# Patient Record
Sex: Male | Born: 1960 | Race: White | Hispanic: No | Smoking: Former smoker
Health system: Southern US, Community
[De-identification: ages and names within clinical notes are randomized; demographics above are authoritative.]

## PROBLEM LIST (undated history)

## (undated) DIAGNOSIS — K90829 Short bowel syndrome, unspecified: Secondary | ICD-10-CM

## (undated) DIAGNOSIS — I339 Acute and subacute endocarditis, unspecified: Secondary | ICD-10-CM

## (undated) DIAGNOSIS — F32A Depression, unspecified: Secondary | ICD-10-CM

## (undated) DIAGNOSIS — F509 Eating disorder, unspecified: Secondary | ICD-10-CM

## (undated) DIAGNOSIS — F329 Major depressive disorder, single episode, unspecified: Secondary | ICD-10-CM

## (undated) DIAGNOSIS — N200 Calculus of kidney: Secondary | ICD-10-CM

## (undated) DIAGNOSIS — Z5189 Encounter for other specified aftercare: Secondary | ICD-10-CM

## (undated) DIAGNOSIS — K5090 Crohn's disease, unspecified, without complications: Secondary | ICD-10-CM

## (undated) DIAGNOSIS — K219 Gastro-esophageal reflux disease without esophagitis: Secondary | ICD-10-CM

## (undated) DIAGNOSIS — K912 Postsurgical malabsorption, not elsewhere classified: Secondary | ICD-10-CM

## (undated) DIAGNOSIS — M810 Age-related osteoporosis without current pathological fracture: Secondary | ICD-10-CM

## (undated) HISTORY — PX: ILEOSTOMY: SHX1783

## (undated) HISTORY — DX: Acute and subacute endocarditis, unspecified: I33.9

## (undated) HISTORY — DX: Depression, unspecified: F32.A

## (undated) HISTORY — DX: Age-related osteoporosis without current pathological fracture: M81.0

## (undated) HISTORY — DX: Short bowel syndrome, unspecified: K90.829

## (undated) HISTORY — DX: Postsurgical malabsorption, not elsewhere classified: K91.2

## (undated) HISTORY — DX: Encounter for other specified aftercare: Z51.89

## (undated) HISTORY — DX: Crohn's disease, unspecified, without complications: K50.90

## (undated) HISTORY — DX: Gastro-esophageal reflux disease without esophagitis: K21.9

## (undated) HISTORY — DX: Eating disorder, unspecified: F50.9

## (undated) HISTORY — PX: SUBTOTAL COLECTOMY: SHX855

## (undated) HISTORY — DX: Major depressive disorder, single episode, unspecified: F32.9

## (undated) HISTORY — PX: SMALL INTESTINE SURGERY: SHX150

---

## 2015-09-09 DIAGNOSIS — K50814 Crohn's disease of both small and large intestine with abscess: Secondary | ICD-10-CM | POA: Diagnosis not present

## 2015-09-09 DIAGNOSIS — K912 Postsurgical malabsorption, not elsewhere classified: Secondary | ICD-10-CM | POA: Diagnosis not present

## 2015-09-09 DIAGNOSIS — I339 Acute and subacute endocarditis, unspecified: Secondary | ICD-10-CM | POA: Diagnosis not present

## 2015-12-02 DIAGNOSIS — K50814 Crohn's disease of both small and large intestine with abscess: Secondary | ICD-10-CM | POA: Diagnosis not present

## 2015-12-02 DIAGNOSIS — E559 Vitamin D deficiency, unspecified: Secondary | ICD-10-CM | POA: Diagnosis not present

## 2015-12-02 DIAGNOSIS — K219 Gastro-esophageal reflux disease without esophagitis: Secondary | ICD-10-CM | POA: Diagnosis not present

## 2015-12-02 DIAGNOSIS — K912 Postsurgical malabsorption, not elsewhere classified: Secondary | ICD-10-CM | POA: Diagnosis not present

## 2016-02-02 DIAGNOSIS — K50919 Crohn's disease, unspecified, with unspecified complications: Secondary | ICD-10-CM | POA: Diagnosis not present

## 2016-02-02 DIAGNOSIS — K6389 Other specified diseases of intestine: Secondary | ICD-10-CM | POA: Diagnosis not present

## 2016-02-02 DIAGNOSIS — Z8719 Personal history of other diseases of the digestive system: Secondary | ICD-10-CM | POA: Diagnosis not present

## 2016-02-02 DIAGNOSIS — N200 Calculus of kidney: Secondary | ICD-10-CM | POA: Diagnosis not present

## 2016-02-08 DIAGNOSIS — R109 Unspecified abdominal pain: Secondary | ICD-10-CM | POA: Diagnosis not present

## 2016-02-08 DIAGNOSIS — J439 Emphysema, unspecified: Secondary | ICD-10-CM | POA: Diagnosis not present

## 2016-02-08 DIAGNOSIS — E860 Dehydration: Secondary | ICD-10-CM | POA: Diagnosis not present

## 2016-03-31 DIAGNOSIS — K50814 Crohn's disease of both small and large intestine with abscess: Secondary | ICD-10-CM | POA: Diagnosis not present

## 2016-06-11 DIAGNOSIS — Z23 Encounter for immunization: Secondary | ICD-10-CM | POA: Diagnosis not present

## 2016-06-19 DIAGNOSIS — R630 Anorexia: Secondary | ICD-10-CM | POA: Diagnosis not present

## 2016-06-19 DIAGNOSIS — M549 Dorsalgia, unspecified: Secondary | ICD-10-CM | POA: Diagnosis not present

## 2016-06-19 DIAGNOSIS — G8929 Other chronic pain: Secondary | ICD-10-CM | POA: Diagnosis not present

## 2016-06-19 DIAGNOSIS — K219 Gastro-esophageal reflux disease without esophagitis: Secondary | ICD-10-CM | POA: Diagnosis not present

## 2016-06-19 DIAGNOSIS — R531 Weakness: Secondary | ICD-10-CM | POA: Diagnosis not present

## 2016-06-19 DIAGNOSIS — Z743 Need for continuous supervision: Secondary | ICD-10-CM | POA: Diagnosis not present

## 2016-06-19 DIAGNOSIS — K5090 Crohn's disease, unspecified, without complications: Secondary | ICD-10-CM | POA: Diagnosis not present

## 2016-06-19 DIAGNOSIS — F329 Major depressive disorder, single episode, unspecified: Secondary | ICD-10-CM | POA: Diagnosis not present

## 2016-06-19 DIAGNOSIS — M545 Low back pain: Secondary | ICD-10-CM | POA: Diagnosis not present

## 2016-06-19 DIAGNOSIS — Z79899 Other long term (current) drug therapy: Secondary | ICD-10-CM | POA: Diagnosis not present

## 2016-06-19 DIAGNOSIS — R110 Nausea: Secondary | ICD-10-CM | POA: Diagnosis not present

## 2016-07-20 DIAGNOSIS — K219 Gastro-esophageal reflux disease without esophagitis: Secondary | ICD-10-CM | POA: Diagnosis not present

## 2016-07-20 DIAGNOSIS — K912 Postsurgical malabsorption, not elsewhere classified: Secondary | ICD-10-CM | POA: Diagnosis not present

## 2016-07-20 DIAGNOSIS — Z9889 Other specified postprocedural states: Secondary | ICD-10-CM | POA: Diagnosis not present

## 2016-07-20 DIAGNOSIS — R634 Abnormal weight loss: Secondary | ICD-10-CM | POA: Diagnosis not present

## 2016-07-20 DIAGNOSIS — K50814 Crohn's disease of both small and large intestine with abscess: Secondary | ICD-10-CM | POA: Diagnosis not present

## 2016-09-09 DIAGNOSIS — R202 Paresthesia of skin: Secondary | ICD-10-CM | POA: Diagnosis not present

## 2016-09-09 DIAGNOSIS — M48061 Spinal stenosis, lumbar region without neurogenic claudication: Secondary | ICD-10-CM | POA: Diagnosis not present

## 2016-09-09 DIAGNOSIS — R200 Anesthesia of skin: Secondary | ICD-10-CM | POA: Diagnosis not present

## 2016-09-09 DIAGNOSIS — M5416 Radiculopathy, lumbar region: Secondary | ICD-10-CM | POA: Diagnosis not present

## 2016-09-23 DIAGNOSIS — K912 Postsurgical malabsorption, not elsewhere classified: Secondary | ICD-10-CM | POA: Diagnosis not present

## 2016-09-23 DIAGNOSIS — R634 Abnormal weight loss: Secondary | ICD-10-CM | POA: Diagnosis not present

## 2016-09-23 DIAGNOSIS — K50814 Crohn's disease of both small and large intestine with abscess: Secondary | ICD-10-CM | POA: Diagnosis not present

## 2017-01-05 DIAGNOSIS — K50814 Crohn's disease of both small and large intestine with abscess: Secondary | ICD-10-CM | POA: Diagnosis not present

## 2017-01-05 DIAGNOSIS — K912 Postsurgical malabsorption, not elsewhere classified: Secondary | ICD-10-CM | POA: Diagnosis not present

## 2017-01-26 ENCOUNTER — Encounter: Payer: Self-pay | Admitting: Gastroenterology

## 2017-02-23 ENCOUNTER — Ambulatory Visit (INDEPENDENT_AMBULATORY_CARE_PROVIDER_SITE_OTHER): Payer: Medicare Other | Admitting: Gastroenterology

## 2017-02-23 ENCOUNTER — Encounter: Payer: Self-pay | Admitting: Gastroenterology

## 2017-02-23 ENCOUNTER — Encounter (INDEPENDENT_AMBULATORY_CARE_PROVIDER_SITE_OTHER): Payer: Self-pay

## 2017-02-23 ENCOUNTER — Other Ambulatory Visit (INDEPENDENT_AMBULATORY_CARE_PROVIDER_SITE_OTHER): Payer: Medicare Other

## 2017-02-23 VITALS — BP 92/70 | HR 72 | Ht 71.0 in | Wt 117.0 lb

## 2017-02-23 DIAGNOSIS — Z932 Ileostomy status: Secondary | ICD-10-CM | POA: Diagnosis not present

## 2017-02-23 DIAGNOSIS — Z79899 Other long term (current) drug therapy: Secondary | ICD-10-CM | POA: Diagnosis not present

## 2017-02-23 DIAGNOSIS — K50119 Crohn's disease of large intestine with unspecified complications: Secondary | ICD-10-CM

## 2017-02-23 DIAGNOSIS — K50818 Crohn's disease of both small and large intestine with other complication: Secondary | ICD-10-CM

## 2017-02-23 LAB — COMPREHENSIVE METABOLIC PANEL
ALT: 15 U/L (ref 0–53)
AST: 14 U/L (ref 0–37)
Albumin: 3.3 g/dL — ABNORMAL LOW (ref 3.5–5.2)
Alkaline Phosphatase: 62 U/L (ref 39–117)
BILIRUBIN TOTAL: 0.6 mg/dL (ref 0.2–1.2)
BUN: 11 mg/dL (ref 6–23)
CHLORIDE: 104 meq/L (ref 96–112)
CO2: 27 meq/L (ref 19–32)
Calcium: 8.9 mg/dL (ref 8.4–10.5)
Creatinine, Ser: 1.13 mg/dL (ref 0.40–1.50)
GFR: 71.39 mL/min (ref >60.00)
GLUCOSE: 123 mg/dL — AB (ref 70–99)
Potassium: 3.7 mEq/L (ref 3.5–5.1)
Sodium: 138 mEq/L (ref 135–145)
Total Protein: 6.5 g/dL (ref 6.0–8.3)

## 2017-02-23 LAB — SEDIMENTATION RATE: Sed Rate: 47 mm/hr — ABNORMAL HIGH (ref 0–20)

## 2017-02-23 LAB — CBC WITH DIFFERENTIAL/PLATELET
BASOS ABS: 0 10*3/uL (ref 0.0–0.1)
Basophils Relative: 0.2 % (ref 0.0–3.0)
EOS ABS: 0.1 10*3/uL (ref 0.0–0.7)
Eosinophils Relative: 0.5 % (ref 0.0–5.0)
HCT: 41.1 % (ref 39.0–52.0)
Hemoglobin: 13.3 g/dL (ref 13.0–17.0)
LYMPHS ABS: 1.6 10*3/uL (ref 0.7–4.0)
Lymphocytes Relative: 10.9 % — ABNORMAL LOW (ref 12.0–46.0)
MCHC: 32.3 g/dL (ref 30.0–36.0)
MCV: 97.3 fl (ref 78.0–100.0)
Monocytes Absolute: 1.3 10*3/uL — ABNORMAL HIGH (ref 0.1–1.0)
Monocytes Relative: 9.2 % (ref 3.0–12.0)
NEUTROS ABS: 11.4 10*3/uL — AB (ref 1.4–7.7)
NEUTROS PCT: 79.2 % — AB (ref 43.0–77.0)
PLATELETS: 317 10*3/uL (ref 150.0–400.0)
RBC: 4.23 Mil/uL (ref 4.22–5.81)
RDW: 16.4 % — ABNORMAL HIGH (ref 11.5–15.5)
WBC: 14.4 10*3/uL — ABNORMAL HIGH (ref 4.0–10.5)

## 2017-02-23 LAB — HIGH SENSITIVITY CRP: CRP, High Sensitivity: 6.9 mg/L — ABNORMAL HIGH (ref 0.000–5.000)

## 2017-02-23 LAB — IRON: IRON: 29 ug/dL — AB (ref 42–165)

## 2017-02-23 LAB — VITAMIN D 25 HYDROXY (VIT D DEFICIENCY, FRACTURES): VITD: 15.01 ng/mL — AB (ref 30.00–100.00)

## 2017-02-23 LAB — FERRITIN: Ferritin: 57.4 ng/mL (ref 22.0–322.0)

## 2017-02-23 MED ORDER — CODEINE SULFATE 30 MG PO TABS: 30.0000 mg | ORAL_TABLET | Freq: Two times a day (BID) | ORAL | 0 refills | Status: DC

## 2017-02-23 MED ORDER — PREDNISONE 5 MG PO TABS: 5.0000 mg | ORAL_TABLET | Freq: Every day | ORAL | 3 refills | Status: DC

## 2017-02-23 MED ORDER — DIPHENOXYLATE-ATROPINE 2.5-0.025 MG PO TABS: 1.0000 | ORAL_TABLET | Freq: Four times a day (QID) | ORAL | 3 refills | Status: DC

## 2017-02-23 NOTE — Patient Instructions (Addendum)
If you are age 56 or older, your body mass index should be between 23-30. Your Body mass index is 16.32 kg/m. If this is out of the aforementioned range listed, please consider follow up with your Primary Care Provider.  If you are age 43 or younger, your body mass index should be between 19-25. Your Body mass index is 16.32 kg/m. If this is out of the aformentioned range listed, please consider follow up with your Primary Care Provider.   Your physician has requested that you go to the basement for lab work before leaving today.  We have sent the following medications to your pharmacy for you to pick up at your convenience:  We have sent the following medications to your pharmacy for you to pick up at your convenience:  Lomotil  Codeine  Prednisone  You have been scheduled for a CT enterography of the abdomen and pelvis at [facility] ([address]---this is in the same building as Press [facility]).   You are scheduled on Friday, June 29th at 2:30pm. You should arrive at 1:15 for your appointment. Please follow the written instructions below on the day of your exam:  WARNING: IF YOU ARE ALLERGIC TO IODINE/X-RAY DYE, PLEASE NOTIFY RADIOLOGY IMMEDIATELY AT [phone]! YOU WILL BE GIVEN A 13 HOUR PREMEDICATION PREP.  1) Do not eat after 10:30am (4 hours prior to your test) You may have water only.  You may take any medications as prescribed with a small amount of water except for the following: Metformin, Glucophage, Glucovance, Avandamet, Riomet, Fortamet, Actoplus Met, Janumet, Glumetza or Metaglip. The above medications must be held the day of the exam AND 48 hours after the exam.  This test typically takes 30-45 minutes to complete.  If you have any questions regarding your exam or if you need to reschedule, you may call the CT department at [phone] between the hours of 8:00 am and 5:00 pm, Monday-Friday.  Thank  you. ________________________________________________________________________

## 2017-02-23 NOTE — Progress Notes (Signed)
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 He moved to [place] about 2 months ago. He does not have a primary care physician.   Current regimen is Cimzia 454m every 30 days, prednisone 131mdaily, and sulfasalazine.  Diagnosed with Crohn's disease in 1988sHe reports small bowel disease, with multiple small bowel resections / abscesses. He has a ileostomy with a remnant rectal pouch. He reports he has had a bowel surgery in the 1982, with a "few more surgeries" [place] [facility] which did not work well for him, too much diarrhea and it was then reversed back to ileostomy. He has a history of abscess associated with small bowel.   He is chronic loose stools from the ostomy. He is on chronic codeine 3070mvery 6-12 hours for diarrhea, which he states it the only thing that has helped. He thinks this controls his output and prevent dehydration. He has chronic abdominal pains - he has abdominal pain at his surgical sites. He also has ongoing abdominal cramps which bothers him, he has it daily. Eating can precipitate his symptoms. He thinks high residual foods can cause severe pain. He is on a low residual diet. No blood from the ostomy. He reports he weighs 117 lbs, he has gained 4 lbs recently.   He has been hospitalized multiple times in recent years, the last time was 2 years ago due to dehydration from a Crohn's flare. He reports multiple ER visits. He is taking lomotil as well to help with this.   He has been on chronic prednisone for 10 years with fluctuating levels. He has been on Cimzia for about 4-5 years. He thinks it has definitely helped compared to not being on it. He has been on sulfasalazine for a long time.   He reports an allergy to 6MP - he does  not know the details of the reaction. He had an allergic reaction - hives, to both Remicade and Humira. He has not been on methotrexate.  He thinks the last time he has had imaging of his bowel or an ileoscopy has been 2-4 years ago.  He has been told he has short gut syndrome. He does not have a primary care physician.     Past Medical History:  Diagnosis Date  . Acute endocarditis   . [place] ([facility] [place] GERD (gastroesophageal reflux disease)   . Short gut syndrome      Past Surgical History:  Procedure Laterality Date  . ILEOSTOMY    . SMALL INTESTINE SURGERY    . SUBTOTAL COLECTOMY     Family History  Problem Relation Age of Onset  . Heart disease Mother   . Diabetes Father   . Heart disease Father   . Melanoma Sister   . Crohn's disease Brother   . Inflammatory bowel disease Brother   . Diabetes Paternal Grandmother    Social History  Substance Use Topics  . Smoking status: Never Smoker  . Smokeless tobacco: Never Used  . Alcohol use No   Current Outpatient Prescriptions  Medication Sig Dispense Refill  . cholecalciferol (VITAMIN D) 400 units TABS tablet Take 400 Units by mouth daily.    . CMarland KitchenMZIA PREFILLED 2 X 200 MG/ML KIT Inject 400 mg into the skin  every 30 (thirty) days.    . codeine 30 MG tablet Take 1 tablet (30 mg total) by mouth 2 (two) times daily. 60 tablet 0  . diphenoxylate-atropine (LOMOTIL) 2.5-0.025 MG tablet Take 1 tablet by mouth 4 (four) times daily. 831 tablet 3  . folic acid (FOLVITE) 1 MG tablet Take 1 mg by mouth daily.    Marland Kitchen omeprazole (PRILOSEC) 20 MG capsule Take 1 capsule by mouth daily.    . ondansetron (ZOFRAN-ODT) 4 MG disintegrating tablet Take 1 tablet by mouth as needed.    . potassium chloride SA (K-DUR,KLOR-CON) 20 MEQ tablet Take 1 tablet by mouth as directed.    . predniSONE (DELTASONE) 5 MG tablet Take 1 tablet (5 mg total) by mouth daily. 30 tablet 3  . sertraline (ZOLOFT) 50 MG tablet Take 50 mg by mouth daily.    Marland Kitchen  sulfaSALAzine (AZULFIDINE) 500 MG tablet Take 2 tablets by mouth 3 (three) times daily.     No current facility-administered medications for this visit.    Allergies  Allergen Reactions  . Humira [Adalimumab]     Intolerance  . Penicillins   . Remicade [Infliximab]     Intolerance     Review of Systems: All systems reviewed and negative except where noted in HPI.    No results found.  Physical Exam: BP 92/70   Pulse 72   Ht 5' 11"  (1.803 m)   Wt 117 lb (53.1 kg)   BMI 16.32 kg/m  Constitutional: Pleasant, thin appearing male in no acute distress. HEENT: Normocephalic and atraumatic. Conjunctivae are normal. No scleral icterus. Neck supple.  Cardiovascular: Normal rate, regular rhythm.  Pulmonary/chest: Effort normal and breath sounds normal. No wheezing, rales or rhonchi. Abdominal: Soft, nondistended, nontender. Abdominal scars noted ostomy in LLQ.There are no masses palpable. No hepatomegaly. Extremities: no edema Lymphadenopathy: No cervical adenopathy noted. Neurological: Alert and oriented to person place and time. Skin: Skin is warm and dry. No rashes noted. Psychiatric: Normal mood and affect. Behavior is normal.   ASSESSMENT AND PLAN: 56 year old male here to establish care as a new patient for Crohn's disease, after being followed in [place] for several years for his care.  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 It appears he has been steroid dependent for > 10 years, and intolerant to Remicade, Humira, and 6-MP. Managed now with Cimzia along with low-dose steroids and sulfasalazine. I have no prior records available at this time. He is on chronic narcotics for management of diarrhea which does appear to help. He is stable but overall remains very symptomatic and generally does not feel well.   At this time recommending the  following: - basic labs - CBC, CMET, ESR, CRP, vitamin D, iron studies / ferritin, quantiferon gold - CT enterography study - restage Crohn's, assess for stricturing / active inflammation - obtain prior records to clarify his surgical history - obtain Cimzia trough level on July 11th - it's very possible he has developed immunogenicity to this drug. If he has active inflammation on enterography study despite therapeutic levels of Cimzia, or has developed undetectable drug due to antibodies, will need to change his regimen, and would strongly consider Entyvio in this situation - based on CT and labs, may need ileoscopy as well - discussed long term prednisone use and risks. Goal of these other biologic therapies is to avoid steroid use. He has likely developed  a component of adrenal insufficiency with long term use. Recommend he taper slow to 54m per day. If he feels poorly while doing this he should resume dose and contact me. I think he may benefit from Endocrinology evaluation to help wean him off moving forward - will likely stop sulfasalazine moving forward, won't make too many changes today given it is out first visit and he's been on this a long time. I think combining Cimzia or Enyvio with methotrexate may be a better long term option - will seek approval for Entyvio pending results of his workup to see how much this would be covered and cost to the patient.  - low residual diet  He agreed with the plan, continue regimen in the interim. Further recommendations pending results.  SCarolina Cellar MD [facility] Pager [phone]

## 2017-02-25 ENCOUNTER — Other Ambulatory Visit: Payer: Self-pay

## 2017-02-25 LAB — QUANTIFERON TB GOLD ASSAY (BLOOD)
Interferon Gamma Release Assay: NEGATIVE
MITOGEN-NIL SO: 6.75 [IU]/mL
QUANTIFERON TB AG MINUS NIL: 0.01 [IU]/mL
Quantiferon Nil Value: 0.08 IU/mL

## 2017-02-25 MED ORDER — VITAMIN D (ERGOCALCIFEROL) 1.25 MG (50000 UNIT) PO CAPS: 50000.0000 [IU] | ORAL_CAPSULE | ORAL | 0 refills | Status: DC

## 2017-02-26 ENCOUNTER — Ambulatory Visit (HOSPITAL_COMMUNITY): Payer: Medicare Other

## 2017-02-26 ENCOUNTER — Other Ambulatory Visit: Payer: Self-pay

## 2017-02-26 ENCOUNTER — Telehealth: Payer: Self-pay

## 2017-02-26 ENCOUNTER — Ambulatory Visit
Admission: RE | Admit: 2017-02-26 | Discharge: 2017-02-26 | Disposition: A | Discharge status: Home / Self Care | Payer: Medicare Other | Source: Ambulatory Visit | Attending: Gastroenterology | Admitting: Gastroenterology

## 2017-02-26 DIAGNOSIS — K50818 Crohn's disease of both small and large intestine with other complication: Secondary | ICD-10-CM

## 2017-02-26 NOTE — Telephone Encounter (Addendum)
Pt was seen at our [place] location and the girls were unable to get a "good stick " on him. Pt became agitated and does not want to have the procedure today. Per pt call him next week for an appt.  Stacy request that pt be seen at [facility] for CT  I spoke with patient. He thought that he was just going to need to drink a solution and not have an IV.  He states that he will drink lots of fluids and will schedule something next week.  In the meantime, he want to know if Dr. Havery Moros wants him to take his Cimzia injection sooner. He states that he is having a bad flare of his Crohn's and that is another reason why he is dehydrated.  Dr. Havery Moros, Do you want him to have his Cimzia injection sooner? He is not due until the 11th. He was also going to check levels prior to the injection.

## 2017-02-26 NOTE — Telephone Encounter (Signed)
Called patient back. They could not get an IV for CT scan. He thinks his Crohns is flaring, recent labs would support this with increased inflammatory markers. While he is steroid dependant, he reports he does respond to higher dose of steroids when he flares. He will increase from 72m to 259mfor 2 weeks and hopefull this will help. Long term our goal is to get him off all prednisone. At this point I don't think Cimzia is doing the job and will await CT Scan. If 2055mrednisone is not helping him he can increase to 76m21mday. We have reached out to his [facility] to obtain [facility] if [facility]. He will hold off on increasing Cimzia for now. I would like him to call me in a week if he is not feeling better. Heading into the weekend he will have to go to the hospital if he cannot tolerate PO or worsening pain.  JuliAlmyra Free you help coordinate a CT scan for him next week (enterography) given when happened today, sounds like [facility]

## 2017-03-01 ENCOUNTER — Other Ambulatory Visit: Payer: Self-pay

## 2017-03-01 ENCOUNTER — Telehealth: Payer: Self-pay

## 2017-03-01 MED ORDER — FOLIC ACID 1 MG PO TABS: 1.0000 mg | ORAL_TABLET | Freq: Every day | ORAL | 3 refills | Status: DC

## 2017-03-01 NOTE — Telephone Encounter (Signed)
Error

## 2017-03-01 NOTE — Telephone Encounter (Signed)
Gregory [name], you said you were already working on this, so I will let you finish it. Needs CT enterography at [facility]. Thanks.

## 2017-03-01 NOTE — Telephone Encounter (Signed)
Scheduled CT with enterography for Friday July 8th at 12:30. Pt will need to arrive at 11:15 to register. He will drink oral contrast at 11:30 and the scan will be at 12:30pm. Pt made aware. He also requested refill of folic acid.  Folvite refilled to [facility].

## 2017-03-04 ENCOUNTER — Other Ambulatory Visit: Payer: Self-pay

## 2017-03-04 DIAGNOSIS — K50819 Crohn's disease of both small and large intestine with unspecified complications: Secondary | ICD-10-CM

## 2017-03-05 ENCOUNTER — Ambulatory Visit (HOSPITAL_COMMUNITY)
Admission: RE | Admit: 2017-03-05 | Discharge: 2017-03-05 | Disposition: A | Discharge status: Home / Self Care | Payer: Medicare Other | Source: Ambulatory Visit | Attending: Gastroenterology | Admitting: Gastroenterology

## 2017-03-05 ENCOUNTER — Telehealth: Payer: Self-pay

## 2017-03-05 DIAGNOSIS — K5090 Crohn's disease, unspecified, without complications: Secondary | ICD-10-CM | POA: Diagnosis not present

## 2017-03-05 DIAGNOSIS — Z932 Ileostomy status: Secondary | ICD-10-CM | POA: Diagnosis not present

## 2017-03-05 DIAGNOSIS — K50818 Crohn's disease of both small and large intestine with other complication: Secondary | ICD-10-CM | POA: Diagnosis not present

## 2017-03-05 DIAGNOSIS — N200 Calculus of kidney: Secondary | ICD-10-CM | POA: Insufficient documentation

## 2017-03-05 DIAGNOSIS — I700 Atherosclerosis of aorta: Secondary | ICD-10-CM | POA: Insufficient documentation

## 2017-03-05 MED ORDER — IOPAMIDOL (ISOVUE-300) INJECTION 61%
INTRAVENOUS | Status: AC
  Filled 2017-03-05: qty 100

## 2017-03-05 MED ORDER — IOPAMIDOL (ISOVUE-300) INJECTION 61%
100.0000 mL | Freq: Once | INTRAVENOUS | Status: AC | PRN
  Administered 2017-03-05: 100 mL via INTRAVENOUS

## 2017-03-05 NOTE — Telephone Encounter (Signed)
-----   Message from Darden Dates sent at 03/05/2017  7:51 AM EDT ----- Regarding: Malachi Pro, I noticed you needed Entyvio precerted for this patient.  He has Medicare and there is no precert req.  You can go ahead and set up at [facility]. Thanks, Amy

## 2017-03-08 ENCOUNTER — Telehealth: Payer: Self-pay | Admitting: Gastroenterology

## 2017-03-08 NOTE — Telephone Encounter (Signed)
Got records from last 2 visits or so from his primary GI. Not a lot of new information regarding his case. 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

## 2017-03-08 NOTE — Telephone Encounter (Signed)
[facility] and per that office it will probably be tomorrow before they contact him regarding the appt.

## 2017-03-09 ENCOUNTER — Telehealth: Payer: Self-pay | Admitting: Gastroenterology

## 2017-03-09 ENCOUNTER — Other Ambulatory Visit: Payer: Self-pay

## 2017-03-09 DIAGNOSIS — R1084 Generalized abdominal pain: Secondary | ICD-10-CM | POA: Diagnosis not present

## 2017-03-09 DIAGNOSIS — N410 Acute prostatitis: Secondary | ICD-10-CM | POA: Diagnosis not present

## 2017-03-09 DIAGNOSIS — N200 Calculus of kidney: Secondary | ICD-10-CM | POA: Diagnosis not present

## 2017-03-09 DIAGNOSIS — Z79899 Other long term (current) drug therapy: Secondary | ICD-10-CM

## 2017-03-09 MED ORDER — AMBULATORY NON FORMULARY MEDICATION: 6 refills | Status: DC

## 2017-03-09 MED ORDER — RANITIDINE HCL 150 MG PO TABS: 150.0000 mg | ORAL_TABLET | Freq: Two times a day (BID) | ORAL | 6 refills | Status: DC

## 2017-03-09 MED ORDER — PREDNISONE 10 MG PO TABS: ORAL_TABLET | ORAL | 0 refills | Status: DC

## 2017-03-09 MED ORDER — METHOTREXATE (PF) 25 MG/0.4ML ~~LOC~~ SOAJ: 25.0000 mg | SUBCUTANEOUS | 4 refills | Status: DC

## 2017-03-09 NOTE — Progress Notes (Signed)
Dr. Havery Moros,   "Major" warning with Methotrexate and PPI.  I printed and placed on your desk.  Please advise alternate class of medication to replace PPI.

## 2017-03-09 NOTE — Progress Notes (Signed)
Thanks very much for your help with this patient Gregory [name],  - can switch omeprazole to Zantac 145m BID - he should start methotrexate when he is able to - he should stop the sulfasalazine - continue folic acid 157mdaily - I think we should stop Cimzia - he's been on it for a long time with active inflammation in his bowel, it is not doing the job and will transition to another regimen (Entyvio hopefully) - continue prednisone 2021m day for another week then taper by 5mg54mweek if possible - repeat CBC and LFTs 2 weeks after starting methotrexate - thanks for getting him into seeing Urology - will await his appointment for approval for Cimzia  Thanks again

## 2017-03-09 NOTE — Progress Notes (Signed)
Patient's insurance will not cover prefilled methotrexate.  I sent a new rx to the pharmacy for alternative form.  Patient verbalized understanding to D/C Cimzia Omeprazole Sulfasalazine  He will bring his financial documents to the office and we will fax to [facility] connects.  He is encouraged to keep consult appt with [facility] even if he has not had approval for discounted drug from [facility] connects, may take a few weeks to hear back,.  He is aware that I will be in touch about methotrexate teaching once we hear from his pharmacy.   He verbalized understanding of prednisone instructions. He will pick up the new rx for zantac.   He is also aware that he will need labs in 2 weeks after starting methotrexate.   He did see urology today.  Kidney stone is too big to pass, he will need to get crohn's under better control then they will consider lithotripsy or alternate ways of removing.  He was started on antibiotics and flow max  For now.

## 2017-03-09 NOTE — Progress Notes (Signed)
Great thanks for clarifying. If [place] or IM methotrexate is not approved we can use oral, but may use a lower dose in this setting if PO so he tolerates it. Thanks again

## 2017-03-09 NOTE — Telephone Encounter (Signed)
See orders only encounter from today for extensive notes.  Application has been filled out and mailed to the patient.

## 2017-03-09 NOTE — Progress Notes (Signed)
I have d/c omeprazole due to interaction with methotrexate. I spoke with patient today about instructions and several problems have come up.    1. He has been scheduled with [facility] for a consult for [facility], however they need $8,000 for the initial infusions.  I mailed him the entyvio connects forms and he will attach his part and mail to entyvio to see if he qualifies for assistance.   2. He reports uncomfortable back pain and pressure as well as the inability to completely empty his bladder, he feels it is related to his kidney stone. His appointment with [facility] has been moved from 04/30/17 to today.  Does he need to continue his Cimzia? Is the dose of prednisone you have indicated to remain the same? He will not see [facility] until 03/26/17 and that is only if the application for assistance from [place] has been approved, he can't afford it otherwise.   Please indicate an alternative class of medication for PPI due to interaction with methotrexate.Does he still need to stop the sulfasalazine?

## 2017-03-10 ENCOUNTER — Telehealth: Payer: Self-pay | Admitting: Gastroenterology

## 2017-03-10 NOTE — Telephone Encounter (Signed)
Sheri has faxed prescription with an OK response. Sheri will re-fax.

## 2017-03-10 NOTE — Telephone Encounter (Signed)
Patient calling back regarding this.

## 2017-03-10 NOTE — Progress Notes (Addendum)
Patient's methotrexate has been approved. He will be able to pick it up in a few days when the pharmacy gets it from the supplier.  They do not provide teaching for drawing up the medication.  He understands that once he has the medication he will call me and we will set up a time to come in for teaching.  He knows how to inject a prefilled syringe, but this will be a vial.   Application for [facility] faxed along with his financial papers.

## 2017-03-10 NOTE — Telephone Encounter (Signed)
Pt states that he the injections. He wants to come ASAP to be given instructions on how to inject the medication.  Per Barbera Setters, pt can come 03/11/17 at 2pm to see her. Pt understood.

## 2017-03-11 NOTE — Telephone Encounter (Signed)
Gregory [name] from [facility] states that medication is not on fomulary. She is needing Dx and OV notes, and previous medications for problem.   P: [phone] F: [phone]

## 2017-03-11 NOTE — Progress Notes (Signed)
Patient came today for injection training.  Patient brought a multidose vial of methotrexate from the pharmacy.  He was instructed using aseptic technique how to properly draw up his weekly injection. He verbalized understanding of the process.  He was provided a handout on how to draw up an injection.  He has experience with sq injections and properly demonstrated a self injection.  All of his questions were answered.  He will call back for any additional questions or concerns.

## 2017-03-12 ENCOUNTER — Telehealth: Payer: Self-pay | Admitting: Gastroenterology

## 2017-03-12 NOTE — Telephone Encounter (Signed)
Patient has already taken his first dose of methotrexate and has the rx

## 2017-03-15 NOTE — Telephone Encounter (Signed)
Called [employer], they need a statement for financial hardship faxed to them along with previously received financial information.

## 2017-03-18 ENCOUNTER — Other Ambulatory Visit: Payer: Self-pay

## 2017-03-18 ENCOUNTER — Telehealth: Payer: Self-pay | Admitting: Gastroenterology

## 2017-03-18 NOTE — Telephone Encounter (Signed)
Optrexup was discontinued last week and patient was switched to methotrexate injectable. Patient initiated another appeal with the insurance today.  I called and spoke with the patient and explained that the Optrexup is a name brand and that he has already received and injected the methotrexate and appeal is not necessary.  I have advised him when the final denial letter comes in the mail, please disregard.

## 2017-03-23 ENCOUNTER — Telehealth: Payer: Self-pay

## 2017-03-23 NOTE — Telephone Encounter (Signed)
Patient has been approved through [employer] for patient assistance on 03/19/17. I will contact [employer] to make sure they received this same information and are working on scheduling this patient.

## 2017-03-23 NOTE — Telephone Encounter (Signed)
Great, thanks for your help with this

## 2017-03-23 NOTE — Telephone Encounter (Signed)
Spoke to [place] at [employer], she wanted to let us know that patient is approved for patient assistance. Called [employer] and spoke to [employer] today to see if they had contacted them, she did not know about that. I have faxed over our information about approved program. Patient is scheduled for 03/26/17 to see doctor and get infusion.

## 2017-03-25 DIAGNOSIS — N200 Calculus of kidney: Secondary | ICD-10-CM | POA: Diagnosis not present

## 2017-03-25 DIAGNOSIS — R1084 Generalized abdominal pain: Secondary | ICD-10-CM | POA: Diagnosis not present

## 2017-03-26 ENCOUNTER — Telehealth: Payer: Self-pay | Admitting: Gastroenterology

## 2017-03-26 DIAGNOSIS — K5090 Crohn's disease, unspecified, without complications: Secondary | ICD-10-CM | POA: Diagnosis not present

## 2017-03-26 DIAGNOSIS — Z79899 Other long term (current) drug therapy: Secondary | ICD-10-CM | POA: Diagnosis not present

## 2017-03-26 NOTE — Telephone Encounter (Signed)
Faxed all labs to [facility] at [phone].

## 2017-03-29 ENCOUNTER — Other Ambulatory Visit: Payer: Self-pay | Admitting: Urology

## 2017-04-02 ENCOUNTER — Telehealth: Payer: Self-pay | Admitting: Gastroenterology

## 2017-04-02 ENCOUNTER — Other Ambulatory Visit: Payer: Self-pay

## 2017-04-02 MED ORDER — POTASSIUM CHLORIDE CRYS ER 20 MEQ PO TBCR: 20.0000 meq | EXTENDED_RELEASE_TABLET | ORAL | 3 refills | Status: DC

## 2017-04-02 NOTE — Telephone Encounter (Signed)
Yes we can refill it if he is taking it chronically.

## 2017-04-02 NOTE — Telephone Encounter (Signed)
Dr. Havery Moros, Are you ok to refill K-Dur?

## 2017-04-10 ENCOUNTER — Inpatient Hospital Stay (HOSPITAL_COMMUNITY)
Admission: EM | Admit: 2017-04-10 | Discharge: 2017-04-13 | DRG: 694 | Disposition: A | Discharge status: Home / Self Care | Payer: Medicare Other | Attending: Urology | Admitting: Urology

## 2017-04-10 ENCOUNTER — Emergency Department (HOSPITAL_COMMUNITY): Payer: Medicare Other

## 2017-04-10 ENCOUNTER — Encounter (HOSPITAL_COMMUNITY): Payer: Self-pay | Admitting: *Deleted

## 2017-04-10 DIAGNOSIS — K912 Postsurgical malabsorption, not elsewhere classified: Secondary | ICD-10-CM | POA: Diagnosis present

## 2017-04-10 DIAGNOSIS — N200 Calculus of kidney: Secondary | ICD-10-CM | POA: Diagnosis not present

## 2017-04-10 DIAGNOSIS — N99528 Other complication of other external stoma of urinary tract: Secondary | ICD-10-CM | POA: Diagnosis not present

## 2017-04-10 DIAGNOSIS — Z888 Allergy status to other drugs, medicaments and biological substances status: Secondary | ICD-10-CM

## 2017-04-10 DIAGNOSIS — K5090 Crohn's disease, unspecified, without complications: Secondary | ICD-10-CM | POA: Diagnosis not present

## 2017-04-10 DIAGNOSIS — Z79899 Other long term (current) drug therapy: Secondary | ICD-10-CM

## 2017-04-10 DIAGNOSIS — N202 Calculus of kidney with calculus of ureter: Secondary | ICD-10-CM | POA: Diagnosis not present

## 2017-04-10 DIAGNOSIS — Z79891 Long term (current) use of opiate analgesic: Secondary | ICD-10-CM

## 2017-04-10 DIAGNOSIS — N132 Hydronephrosis with renal and ureteral calculous obstruction: Principal | ICD-10-CM | POA: Diagnosis present

## 2017-04-10 DIAGNOSIS — Z9049 Acquired absence of other specified parts of digestive tract: Secondary | ICD-10-CM

## 2017-04-10 DIAGNOSIS — Z833 Family history of diabetes mellitus: Secondary | ICD-10-CM

## 2017-04-10 DIAGNOSIS — Y846 Urinary catheterization as the cause of abnormal reaction of the patient, or of later complication, without mention of misadventure at the time of the procedure: Secondary | ICD-10-CM | POA: Diagnosis not present

## 2017-04-10 DIAGNOSIS — N201 Calculus of ureter: Secondary | ICD-10-CM

## 2017-04-10 DIAGNOSIS — Z87442 Personal history of urinary calculi: Secondary | ICD-10-CM

## 2017-04-10 DIAGNOSIS — N1330 Unspecified hydronephrosis: Secondary | ICD-10-CM

## 2017-04-10 DIAGNOSIS — R1032 Left lower quadrant pain: Secondary | ICD-10-CM | POA: Diagnosis not present

## 2017-04-10 DIAGNOSIS — R109 Unspecified abdominal pain: Secondary | ICD-10-CM | POA: Diagnosis not present

## 2017-04-10 DIAGNOSIS — Z8249 Family history of ischemic heart disease and other diseases of the circulatory system: Secondary | ICD-10-CM

## 2017-04-10 DIAGNOSIS — K219 Gastro-esophageal reflux disease without esophagitis: Secondary | ICD-10-CM | POA: Diagnosis not present

## 2017-04-10 DIAGNOSIS — R112 Nausea with vomiting, unspecified: Secondary | ICD-10-CM | POA: Diagnosis not present

## 2017-04-10 DIAGNOSIS — Z880 Allergy status to penicillin: Secondary | ICD-10-CM

## 2017-04-10 LAB — URINALYSIS, ROUTINE W REFLEX MICROSCOPIC
BILIRUBIN URINE: NEGATIVE
Bacteria, UA: NONE SEEN
Glucose, UA: NEGATIVE mg/dL
Ketones, ur: NEGATIVE mg/dL
Leukocytes, UA: NEGATIVE
NITRITE: NEGATIVE
PH: 5 (ref 5.0–8.0)
Protein, ur: 100 mg/dL — AB
SPECIFIC GRAVITY, URINE: 1.02 (ref 1.005–1.030)
SQUAMOUS EPITHELIAL / LPF: NONE SEEN

## 2017-04-10 LAB — CBC WITH DIFFERENTIAL/PLATELET
Basophils Absolute: 0 10*3/uL (ref 0.0–0.1)
Basophils Relative: 0 %
Eosinophils Absolute: 0 10*3/uL (ref 0.0–0.7)
Eosinophils Relative: 0 %
HCT: 34 % — ABNORMAL LOW (ref 39.0–52.0)
Hemoglobin: 11 g/dL — ABNORMAL LOW (ref 13.0–17.0)
Lymphocytes Relative: 9 %
Lymphs Abs: 0.7 10*3/uL (ref 0.7–4.0)
MCH: 30.5 pg (ref 26.0–34.0)
MCHC: 32.4 g/dL (ref 30.0–36.0)
MCV: 94.2 fL (ref 78.0–100.0)
Monocytes Absolute: 0.9 10*3/uL (ref 0.1–1.0)
Monocytes Relative: 11 %
Neutro Abs: 6.6 10*3/uL (ref 1.7–7.7)
Neutrophils Relative %: 80 %
Platelets: 292 10*3/uL (ref 150–400)
RBC: 3.61 MIL/uL — ABNORMAL LOW (ref 4.22–5.81)
RDW: 16 % — ABNORMAL HIGH (ref 11.5–15.5)
WBC: 8.3 10*3/uL (ref 4.0–10.5)

## 2017-04-10 LAB — COMPREHENSIVE METABOLIC PANEL
ALT: 13 U/L — ABNORMAL LOW (ref 17–63)
AST: 15 U/L (ref 15–41)
Albumin: 2.4 g/dL — ABNORMAL LOW (ref 3.5–5.0)
Alkaline Phosphatase: 63 U/L (ref 38–126)
Anion gap: 7 (ref 5–15)
BUN: 9 mg/dL (ref 6–20)
CO2: 27 mmol/L (ref 22–32)
Calcium: 8.5 mg/dL — ABNORMAL LOW (ref 8.9–10.3)
Chloride: 100 mmol/L — ABNORMAL LOW (ref 101–111)
Creatinine, Ser: 1.31 mg/dL — ABNORMAL HIGH (ref 0.61–1.24)
GFR calc Af Amer: >60 mL/min (ref >60)
GFR calc non Af Amer: 60 mL/min — ABNORMAL LOW (ref >60)
Glucose, Bld: 133 mg/dL — ABNORMAL HIGH (ref 65–99)
Potassium: 4 mmol/L (ref 3.5–5.1)
Sodium: 134 mmol/L — ABNORMAL LOW (ref 135–145)
Total Bilirubin: 0.7 mg/dL (ref 0.3–1.2)
Total Protein: 6.2 g/dL — ABNORMAL LOW (ref 6.5–8.1)

## 2017-04-10 LAB — LIPASE, BLOOD: Lipase: 23 U/L (ref 11–51)

## 2017-04-10 MED ORDER — MORPHINE SULFATE (PF) 2 MG/ML IV SOLN
4.0000 mg | Freq: Once | INTRAVENOUS | Status: AC
  Administered 2017-04-10: 4 mg via INTRAVENOUS
  Filled 2017-04-10: qty 2

## 2017-04-10 MED ORDER — ONDANSETRON HCL 4 MG/2ML IJ SOLN: 4.0000 mg | Freq: Once | INTRAMUSCULAR | Status: DC

## 2017-04-10 MED ORDER — KETOROLAC TROMETHAMINE 30 MG/ML IJ SOLN
30.0000 mg | Freq: Once | INTRAMUSCULAR | Status: AC
Start: 2017-04-10 — End: 2017-04-10
  Administered 2017-04-10: 30 mg via INTRAVENOUS
  Filled 2017-04-10: qty 1

## 2017-04-10 MED ORDER — MORPHINE SULFATE (PF) 2 MG/ML IV SOLN: 2.0000 mg | Freq: Once | INTRAVENOUS | Status: DC

## 2017-04-10 MED ORDER — ONDANSETRON 4 MG PO TBDP
4.0000 mg | ORAL_TABLET | Freq: Once | ORAL | Status: AC
  Administered 2017-04-10: 4 mg via ORAL
  Filled 2017-04-10: qty 1

## 2017-04-10 MED ORDER — ONDANSETRON HCL 4 MG/2ML IJ SOLN
4.0000 mg | Freq: Once | INTRAMUSCULAR | Status: AC
  Administered 2017-04-10: 4 mg via INTRAVENOUS
  Filled 2017-04-10: qty 2

## 2017-04-10 MED ORDER — SODIUM CHLORIDE 0.9 % IV BOLUS (SEPSIS)
1000.0000 mL | Freq: Once | INTRAVENOUS | Status: AC
  Administered 2017-04-10: 1000 mL via INTRAVENOUS

## 2017-04-10 MED ORDER — OXYCODONE-ACETAMINOPHEN 5-325 MG PO TABS
1.0000 | ORAL_TABLET | Freq: Once | ORAL | Status: AC
  Administered 2017-04-10: 1 via ORAL
  Filled 2017-04-10: qty 1

## 2017-04-10 NOTE — ED Notes (Signed)
ED Provider at bedside. 

## 2017-04-10 NOTE — ED Provider Notes (Signed)
[facility] DEPT Provider Note   CSN: [id number] Arrival date & time: 04/10/17  1324     History   Chief Complaint Chief Complaint  Patient presents with  . Flank Pain    HPI Leandre Wien is a 56 y.o. male with history of acute endocarditis, Crohn's disease status post subtotal colectomy, GERD, interpreted syndrome who presents a with chief complaint gradual onset, acutely worsening left flank pain. He states that he has had left-sided flank pain for several weeks and was found to have a 2.1 cm stone in the left renal collecting system on imaging 03/05/2017 by his gastroenterologist for evaluation of the left flank pain and back pain that he had been experiensings. Since then he has seen urology Dr. Louis Meckel and is scheduled for left nephrolithotomy on 04/22/2017. He states that he has been managing his pain with tramadol but awoke this morning with acutely worsening severe pressure to the left flank with radiation to the back this morning. He took 3 tramadol without relief of his symptoms. Has has a fever tMax 101F in the past few days but this has resolved. He denies hematuria, dysuria, frequency, or urgency, but states that he has had decreased urine output. He states he has had decreased oral intake as well. This morning he had nausea and vomiting. He has had worsening of diarrhea as well. He denies chills, chest pain, or shortness of breath.   The history is provided by the patient.    Past Medical History:  Diagnosis Date  . Acute endocarditis   . Crohn's disease ([place])   . GERD (gastroesophageal reflux disease)   . Short gut syndrome     There are no active problems to display for this patient.   Past Surgical History:  Procedure Laterality Date  . ILEOSTOMY    . SMALL INTESTINE SURGERY    . SUBTOTAL COLECTOMY         Home Medications    Prior to Admission medications   Medication Sig Start Date End Date Taking? Authorizing Provider  cholecalciferol (VITAMIN D)  400 units TABS tablet Take 400 Units by mouth daily.   Yes [provider]  codeine 30 MG tablet Take 1 tablet (30 mg total) by mouth 2 (two) times daily. 02/23/17  Yes Armbruster, Renelda Loma, MD  diphenoxylate-atropine (LOMOTIL) 2.5-0.025 MG tablet Take 1 tablet by mouth 4 (four) times daily. 02/23/17  Yes Armbruster, Renelda Loma, MD  folic acid (FOLVITE) 1 MG tablet Take 1 tablet (1 mg total) by mouth daily. 03/01/17  Yes Armbruster, Renelda Loma, MD  methotrexate 250 MG/10ML injection Inject 25 mg into the skin once a week.  03/10/17  Yes [provider]  ondansetron (ZOFRAN-ODT) 4 MG disintegrating tablet Take 1 tablet by mouth every 8 (eight) hours as needed for nausea or vomiting.  11/24/16  Yes [provider]  potassium chloride SA (K-DUR,KLOR-CON) 20 MEQ tablet Take 1 tablet (20 mEq total) by mouth as directed. Patient taking differently: Take 20 mEq by mouth as directed. Take 59mq Mondays and Thursdays 04/02/17  Yes Armbruster, SRenelda Loma MD  predniSONE (DELTASONE) 10 MG tablet Take 20 mg by mouth for 1 week, 15 mg daily for 1 week, 10 mg daily for 1 week, then 5 mg a day Patient taking differently: Take 5 mg by mouth daily. Take 20 mg by mouth for 1 week, 15 mg daily for 1 week, 10 mg daily for 1 week, then 5 mg a day 03/09/17  Yes Armbruster, SRenelda Loma  MD  ranitidine (ZANTAC) 150 MG tablet Take 1 tablet (150 mg total) by mouth 2 (two) times daily. 03/09/17  Yes Armbruster, Renelda Loma, MD  tamsulosin (FLOMAX) 0.4 MG CAPS capsule Take 0.4 mg by mouth daily. 04/05/17  Yes [provider]  traMADol (ULTRAM) 50 MG tablet Take 50-150 mg by mouth every 6 (six) hours as needed. 04/09/17  Yes [provider]  sertraline (ZOLOFT) 50 MG tablet Take 50 mg by mouth daily.    [provider]    Family History Family History  Problem Relation Age of Onset  . Heart disease Mother   . Diabetes Father   . Heart disease Father   . Melanoma Sister   .  Crohn's disease Brother   . Inflammatory bowel disease Brother   . Diabetes Paternal Grandmother     Social History Social History  Substance Use Topics  . Smoking status: Never Smoker  . Smokeless tobacco: Never Used  . Alcohol use No     Allergies   Humira [adalimumab]; Penicillins; and Remicade [infliximab]   Review of Systems Review of Systems  Constitutional: Positive for fever. Negative for chills.  Respiratory: Negative for shortness of breath.   Cardiovascular: Negative for chest pain.  Gastrointestinal: Positive for diarrhea, nausea and vomiting. Negative for abdominal pain, blood in stool and constipation.  Genitourinary: Positive for decreased urine volume, difficulty urinating and flank pain. Negative for dysuria and hematuria.  Musculoskeletal: Positive for back pain. Negative for neck pain.  All other systems reviewed and are negative.    Physical Exam Updated Vital Signs BP 110/74 (BP Location: Right Arm)   Pulse 66   Temp 98.4 F (36.9 C) (Oral)   Resp 16   SpO2 98%   Physical Exam  Constitutional: He appears well-developed and well-nourished. No distress.  Chronically ill-appearing, constantly changing positions and appears uncomfortable  HENT:  Head: Normocephalic and atraumatic.  Eyes: Conjunctivae are normal. Right eye exhibits no discharge. Left eye exhibits no discharge.  Neck: Normal range of motion. Neck supple. No JVD present. No tracheal deviation present.  Cardiovascular: Normal rate, regular rhythm and normal heart sounds.   Pulmonary/Chest: Effort normal and breath sounds normal.  Abdominal: Soft. Bowel sounds are normal. He exhibits no distension. There is no tenderness.  Left flank tender to palpation, left CVA tenderness present. Murphy sign absent, no tenderness to palpation at McBurney's point. Colostomy bag is in place in the left lower quadrant with no evidence of surrounding erythema, tenderness, or abnormal drainage.    Musculoskeletal: Normal range of motion. He exhibits no edema.  No midline spine TTP, no paraspinal muscle tenderness, no deformity, crepitus, or step-off noted   Neurological: He is alert.  Fluent speech, no facial droop  Skin: Skin is warm and dry. Capillary refill takes less than 2 seconds. No erythema.  Psychiatric: He has a normal mood and affect. His behavior is normal.  Nursing note and vitals reviewed.    ED Treatments / Results  Labs (all labs ordered are listed, but only abnormal results are displayed) Labs Reviewed  URINALYSIS, ROUTINE W REFLEX MICROSCOPIC - Abnormal; Notable for the following:       Result Value   APPearance HAZY (*)    Hgb urine dipstick MODERATE (*)    Protein, ur 100 (*)    All other components within normal limits  CBC WITH DIFFERENTIAL/PLATELET - Abnormal; Notable for the following:    RBC 3.61 (*)    Hemoglobin 11.0 (*)  HCT 34.0 (*)    RDW 16.0 (*)    All other components within normal limits  COMPREHENSIVE METABOLIC PANEL - Abnormal; Notable for the following:    Sodium 134 (*)    Chloride 100 (*)    Glucose, Bld 133 (*)    Creatinine, Ser 1.31 (*)    Calcium 8.5 (*)    Total Protein 6.2 (*)    Albumin 2.4 (*)    ALT 13 (*)    GFR calc non Af Amer 60 (*)    All other components within normal limits  LIPASE, BLOOD    EKG  EKG Interpretation None       Radiology Ct Renal Stone Study  Result Date: 04/10/2017 CLINICAL DATA:  LEFT kidney stone planned for surgery on 04/22/2017, having severe pain today, nausea and vomiting, history Crohn's disease, GERD, short gut syndrome EXAM: CT ABDOMEN AND PELVIS WITHOUT CONTRAST TECHNIQUE: Multidetector CT imaging of the abdomen and pelvis was performed following the standard protocol without IV contrast. Sagittal and coronal MPR images reconstructed from axial data set. Oral contrast was not administered for this indication. COMPARISON:  03/05/2017 FINDINGS: Lower chest: Blebs at lung bases  bilaterally. Dependent atelectasis LEFT lower lobe. Hepatobiliary: Post cholecystectomy. Intrahepatic and extrahepatic biliary dilatation again identified, CBD 9 mm, 6 mm previously. No definite focal hepatic lesions. Pancreas: Normal appearance Spleen: Normal appearance Adrenals/Urinary Tract: LEFT hydronephrosis secondary to a large 14 x 10 x 23 mm LEFT UPJ calculus. Additional tiny nonobstructing LEFT renal calculi. Mild perinephric and peripelvic edema LEFT kidney. RIGHT kidney unremarkable. Bladder and ureters unremarkable. Stomach/Bowel: Bowel loops suboptimally assessed due to lack of IV and oral contrast. LEFT lower quadrant colostomy. Hartmann pouch. Dilated segment of bowel in the mid and LEFT abdomen unchanged from previous exams question atonic or denervated segment adjacent to anastomosis. Segment of small bowel wall thickening is again identified in the central pelvis unchanged. Vascular/Lymphatic: Aorta normal caliber. Scattered normal sized mesenteric nodes. Reproductive: Prostate gland 4.6 x 3.3 cm. Other: No free air free fluid.  No hernia. Musculoskeletal: Bones unremarkable. IMPRESSION: New LEFT hydronephrosis secondary to a 14 x 10 x 23 mm diameter LEFT UPJ calculus. Additional tiny nonobstructing LEFT renal calculi. Chronic dilatation of a segment of small bowel adjacent to an anastomosis question atonic or down admitted segment. Chronic bowel wall thickening of a small bowel loop in the central pelvis consistent with history of Crohn's disease. Mild biliary dilatation with CBD appearing larger than was seen on the prior exam, recommend correlation with LFTs. Electronically Signed   By: Lavonia Dana M.D.   On: 04/10/2017 18:22    Procedures Procedures (including critical care time)  Medications Ordered in ED Medications  ketorolac (TORADOL) 30 MG/ML injection 30 mg (30 mg Intravenous Given 04/10/17 1631)  ondansetron (ZOFRAN-ODT) disintegrating tablet 4 mg (4 mg Oral Given 04/10/17 1632)    oxyCODONE-acetaminophen (PERCOCET/ROXICET) 5-325 MG per tablet 1 tablet (1 tablet Oral Given 04/10/17 1636)  sodium chloride 0.9 % bolus 1,000 mL (0 mLs Intravenous Stopped 04/10/17 1701)  sodium chloride 0.9 % bolus 1,000 mL (0 mLs Intravenous Stopped 04/10/17 1906)  morphine 2 MG/ML injection 4 mg (4 mg Intravenous Given 04/10/17 2016)  ondansetron (ZOFRAN) injection 4 mg (4 mg Intravenous Given 04/10/17 2042)     Initial Impression / Assessment and Plan / ED Course  I have reviewed the triage vital signs and the nursing notes.  Pertinent labs & imaging results that were available during my care of the patient  were reviewed by me and considered in my medical decision making (see chart for details).     Patient with worsening left flank pain with known 2.3cm stone. Afebrile, vital signs are stable. No leukocytosis, no significant electrolyte abnormalities. Slight bump in creatinine at 1.31 as well as WBCs and RBCs in urine. CT renal stone study shows new left hydronephrosis secondary to stone in the left UPJ. Unable to manage pain, patient rates pain at 8/10 even after fluids, Percocet, Zofran, Toradol, and morphine. Spoke with Domingo Pulse with urology who agrees to assume care of patient and bring him in to the hospital for pain management and surgical workup.   Final Clinical Impressions(s) / ED Diagnoses   Final diagnoses:  Left ureteral stone  Hydronephrosis of left kidney    New Prescriptions New Prescriptions   No medications on file     Debroah Baller 04/10/17 2127    Pixie Casino, MD 04/11/17 [phone]

## 2017-04-10 NOTE — ED Notes (Signed)
Bed: UI11 Expected date:  Expected time:  Means of arrival:  Comments: Triage 2

## 2017-04-10 NOTE — ED Notes (Addendum)
Pt stated "I think I am clogged up" when asked to give a urine sample. RN notified.

## 2017-04-10 NOTE — ED Triage Notes (Signed)
Pt reports having a confirmed kidney stone on his left side.  Pt is scheduled to have surgery on 8/23 for said stone.  However, pt is having severe pain today and experiencing nausea and vomited.  Pt also states he feels like he is having trouble peeing.

## 2017-04-10 NOTE — ED Notes (Signed)
Pt attempted to urinate but was unable to get any. RN notified.

## 2017-04-10 NOTE — H&P (Signed)
Urology H&P Admission Note   Service Requesting Consult:  Emergency Department Service Providing Consult: Urology  Admitting Attending: Nicolette Bang, MD   Reason for Consult:  nephrolithiasis  Gregory [name] is seen in consultation for reasons noted above at the request of the Emergency Department.  This is a 56 y.o. yo patient with a history of Crohn's disease on methotrexate s/p subtotal colectomy and history of nephrolithiasis. He has a know 2.3cm x 1.4cm left renal stone, for which Dr. Louis Meckel was planning on performing left PCNL on 8/23. He presented to the ED this PM with several days of worsening left flank pain uncontrolled with pain management , nausea, vomiting, dizziness. Denies fevers, chills, dysuria, chest pain, shortness of breath, gross hematuria.   In the ED, VSS. UA benign with numerous RBCs, negative LE/nitrites, 6-30 WBCs and no bacteria. Cr slightly elevated from baseline, 1.31 from baseline 1.1 (June 2018). No leukocytosis. CT abdomen pelvis demonstrated known stone lodged in the proximal ureter, newly obstructing compared to CT done 7/6 and resulting in mild/moderate hydronephrosis.   Patient with history of active Chron's on methotrexate followed by Dr. Havery Moros. Patient approved for second immunosuppressive medication, however waiting until PCNL completed before initiating.    Past Medical History: Past Medical History:  Diagnosis Date  . Acute endocarditis   . Crohn's disease ([place])   . GERD (gastroesophageal reflux disease)   . Short gut syndrome     Past Surgical History:  Past Surgical History:  Procedure Laterality Date  . ILEOSTOMY    . SMALL INTESTINE SURGERY    . SUBTOTAL COLECTOMY      Medication: No current facility-administered medications for this encounter.    Current Outpatient Prescriptions  Medication Sig Dispense Refill  . cholecalciferol (VITAMIN D) 400 units TABS tablet Take 400 Units by mouth daily.    . codeine 30 MG tablet  Take 1 tablet (30 mg total) by mouth 2 (two) times daily. 60 tablet 0  . diphenoxylate-atropine (LOMOTIL) 2.5-0.025 MG tablet Take 1 tablet by mouth 4 (four) times daily. 384 tablet 3  . folic acid (FOLVITE) 1 MG tablet Take 1 tablet (1 mg total) by mouth daily. 30 tablet 3  . methotrexate 250 MG/10ML injection Inject 25 mg into the skin once a week.   0  . ondansetron (ZOFRAN-ODT) 4 MG disintegrating tablet Take 1 tablet by mouth every 8 (eight) hours as needed for nausea or vomiting.     . potassium chloride SA (K-DUR,KLOR-CON) 20 MEQ tablet Take 1 tablet (20 mEq total) by mouth as directed. (Patient taking differently: Take 20 mEq by mouth as directed. Take 53mq Mondays and Thursdays) 90 tablet 3  . predniSONE (DELTASONE) 10 MG tablet Take 20 mg by mouth for 1 week, 15 mg daily for 1 week, 10 mg daily for 1 week, then 5 mg a day (Patient taking differently: Take 5 mg by mouth daily. Take 20 mg by mouth for 1 week, 15 mg daily for 1 week, 10 mg daily for 1 week, then 5 mg a day) 100 tablet 0  . ranitidine (ZANTAC) 150 MG tablet Take 1 tablet (150 mg total) by mouth 2 (two) times daily. 60 tablet 6  . tamsulosin (FLOMAX) 0.4 MG CAPS capsule Take 0.4 mg by mouth daily.  11  . traMADol (ULTRAM) 50 MG tablet Take 50-150 mg by mouth every 6 (six) hours as needed.  0  . sertraline (ZOLOFT) 50 MG tablet Take 50 mg by mouth daily.  Allergies: Allergies  Allergen Reactions  . Humira [Adalimumab]     Intolerance  . Penicillins   . Remicade [Infliximab]     Intolerance    Social History: Social History  Substance Use Topics  . Smoking status: Never Smoker  . Smokeless tobacco: Never Used  . Alcohol use No    Family History Family History  Problem Relation Age of Onset  . Heart disease Mother   . Diabetes Father   . Heart disease Father   . Melanoma Sister   . Crohn's disease Brother   . Inflammatory bowel disease Brother   . Diabetes Paternal Grandmother     Review of  Systems 10 systems were reviewed and are negative except as noted specifically in the HPI.  Objective   Vital signs in last 24 hours: BP 110/74 (BP Location: Right Arm)   Pulse 66   Temp 98.4 F (36.9 C) (Oral)   Resp 16   SpO2 98%   Intake/Output last 3 shifts: I/O last 3 completed shifts: In: 1000 [IV Piggyback:1000] Out: -   Physical Exam General: NAD, A&O, resting, appropriate HEENT: [place]/AT, EOMI, MMM Pulmonary: Normal work of breathing on RA Cardiovascular: HDS, adequate peripheral perfusion Abdomen: soft, NTTP, nondistended, no suprapubic fullness or tenderness GU: Left CVA tenderness Extremities: warm and well perfused, no edema Neuro: Appropriate, no focal neurological deficits  Most Recent Labs: Lab Results  Component Value Date   WBC 8.3 04/10/2017   HGB 11.0 (L) 04/10/2017   HCT 34.0 (L) 04/10/2017   PLT 292 04/10/2017    Lab Results  Component Value Date   NA 134 (L) 04/10/2017   K 4.0 04/10/2017   CL 100 (L) 04/10/2017   CO2 27 04/10/2017   BUN 9 04/10/2017   CREATININE 1.31 (H) 04/10/2017   CALCIUM 8.5 (L) 04/10/2017    Lab Results  Component Value Date   ALKPHOS 63 04/10/2017   BILITOT 0.7 04/10/2017   PROT 6.2 (L) 04/10/2017   ALBUMIN 2.4 (L) 04/10/2017   ALT 13 (L) 04/10/2017   AST 15 04/10/2017    No results found for: INR, APTT   Urine Culture: None  IMAGING: Ct Renal Stone Study  Result Date: 04/10/2017 CLINICAL DATA:  LEFT kidney stone planned for surgery on 04/22/2017, having severe pain today, nausea and vomiting, history Crohn's disease, GERD, short gut syndrome EXAM: CT ABDOMEN AND PELVIS WITHOUT CONTRAST TECHNIQUE: Multidetector CT imaging of the abdomen and pelvis was performed following the standard protocol without IV contrast. Sagittal and coronal MPR images reconstructed from axial data set. Oral contrast was not administered for this indication. COMPARISON:  03/05/2017 FINDINGS: Lower chest: Blebs at lung bases  bilaterally. Dependent atelectasis LEFT lower lobe. Hepatobiliary: Post cholecystectomy. Intrahepatic and extrahepatic biliary dilatation again identified, CBD 9 mm, 6 mm previously. No definite focal hepatic lesions. Pancreas: Normal appearance Spleen: Normal appearance Adrenals/Urinary Tract: LEFT hydronephrosis secondary to a large 14 x 10 x 23 mm LEFT UPJ calculus. Additional tiny nonobstructing LEFT renal calculi. Mild perinephric and peripelvic edema LEFT kidney. RIGHT kidney unremarkable. Bladder and ureters unremarkable. Stomach/Bowel: Bowel loops suboptimally assessed due to lack of IV and oral contrast. LEFT lower quadrant colostomy. Hartmann pouch. Dilated segment of bowel in the mid and LEFT abdomen unchanged from previous exams question atonic or denervated segment adjacent to anastomosis. Segment of small bowel wall thickening is again identified in the central pelvis unchanged. Vascular/Lymphatic: Aorta normal caliber. Scattered normal sized mesenteric nodes. Reproductive: Prostate gland 4.6 x 3.3 cm.  Other: No free air free fluid.  No hernia. Musculoskeletal: Bones unremarkable. IMPRESSION: New LEFT hydronephrosis secondary to a 14 x 10 x 23 mm diameter LEFT UPJ calculus. Additional tiny nonobstructing LEFT renal calculi. Chronic dilatation of a segment of small bowel adjacent to an anastomosis question atonic or down admitted segment. Chronic bowel wall thickening of a small bowel loop in the central pelvis consistent with history of Crohn's disease. Mild biliary dilatation with CBD appearing larger than was seen on the prior exam, recommend correlation with LFTs. Electronically Signed   By: Lavonia Dana M.D.   On: 04/10/2017 18:22    ------  Assessment:  Patient is a 56 y.o. male with hx Crohn's and nephrolithiasis with 2.3cm x 1.4cm obstructing left UPJ stone and moderate hydronephrosis, moved and newly obstructing compared to CT scan 1 month ago. Patient scheduled for PCNL with Dr. Louis Meckel  on 8/23. In ED, has received toradol, morphine and zofran without improvement in symptoms.   Will admit to urology for symptom management. Will plan to pursue left collecting system drainage with left PCN placement by VIR, will consult in AM.    Recommendations: 1. Admit to urology service, floor status, Dr. Alyson Ingles attending 2. Urine culture  3. PVR 4. Consult [facility] in AM for L PCN placement  5. Maximal pain management with PRNs 6. Continue home meds    Thank you for this consult. Please contact the urology consult pager with any further questions/concerns.  Jonna Clark, MD Urology Surgical Resident  -----

## 2017-04-10 NOTE — ED Notes (Signed)
Patient transported to CT 

## 2017-04-10 NOTE — ED Notes (Signed)
ED Provider at bedside. PA came out stating that patient thinks if he had another bag of IV fluids he would be able to get urine specimen.  Per PA hold off on in and out cath at this time.

## 2017-04-11 DIAGNOSIS — R109 Unspecified abdominal pain: Secondary | ICD-10-CM | POA: Diagnosis not present

## 2017-04-11 DIAGNOSIS — Y846 Urinary catheterization as the cause of abnormal reaction of the patient, or of later complication, without mention of misadventure at the time of the procedure: Secondary | ICD-10-CM | POA: Diagnosis not present

## 2017-04-11 DIAGNOSIS — N132 Hydronephrosis with renal and ureteral calculous obstruction: Secondary | ICD-10-CM | POA: Diagnosis present

## 2017-04-11 DIAGNOSIS — Z9049 Acquired absence of other specified parts of digestive tract: Secondary | ICD-10-CM | POA: Diagnosis not present

## 2017-04-11 DIAGNOSIS — Z8249 Family history of ischemic heart disease and other diseases of the circulatory system: Secondary | ICD-10-CM | POA: Diagnosis not present

## 2017-04-11 DIAGNOSIS — R1032 Left lower quadrant pain: Secondary | ICD-10-CM | POA: Diagnosis not present

## 2017-04-11 DIAGNOSIS — Z79891 Long term (current) use of opiate analgesic: Secondary | ICD-10-CM | POA: Diagnosis not present

## 2017-04-11 DIAGNOSIS — Z79899 Other long term (current) drug therapy: Secondary | ICD-10-CM | POA: Diagnosis not present

## 2017-04-11 DIAGNOSIS — N200 Calculus of kidney: Secondary | ICD-10-CM | POA: Diagnosis not present

## 2017-04-11 DIAGNOSIS — Z4682 Encounter for fitting and adjustment of non-vascular catheter: Secondary | ICD-10-CM | POA: Diagnosis not present

## 2017-04-11 DIAGNOSIS — Z880 Allergy status to penicillin: Secondary | ICD-10-CM | POA: Diagnosis not present

## 2017-04-11 DIAGNOSIS — Z888 Allergy status to other drugs, medicaments and biological substances status: Secondary | ICD-10-CM | POA: Diagnosis not present

## 2017-04-11 DIAGNOSIS — K912 Postsurgical malabsorption, not elsewhere classified: Secondary | ICD-10-CM | POA: Diagnosis present

## 2017-04-11 DIAGNOSIS — R112 Nausea with vomiting, unspecified: Secondary | ICD-10-CM | POA: Diagnosis not present

## 2017-04-11 DIAGNOSIS — Z87442 Personal history of urinary calculi: Secondary | ICD-10-CM | POA: Diagnosis not present

## 2017-04-11 DIAGNOSIS — K5090 Crohn's disease, unspecified, without complications: Secondary | ICD-10-CM | POA: Diagnosis present

## 2017-04-11 DIAGNOSIS — K219 Gastro-esophageal reflux disease without esophagitis: Secondary | ICD-10-CM | POA: Diagnosis present

## 2017-04-11 DIAGNOSIS — N99528 Other complication of other external stoma of urinary tract: Secondary | ICD-10-CM | POA: Diagnosis not present

## 2017-04-11 DIAGNOSIS — Z833 Family history of diabetes mellitus: Secondary | ICD-10-CM | POA: Diagnosis not present

## 2017-04-11 DIAGNOSIS — T83092A Other mechanical complication of nephrostomy catheter, initial encounter: Secondary | ICD-10-CM | POA: Diagnosis not present

## 2017-04-11 LAB — BASIC METABOLIC PANEL
Anion gap: 7 (ref 5–15)
BUN: 11 mg/dL (ref 6–20)
CHLORIDE: 105 mmol/L (ref 101–111)
CO2: 24 mmol/L (ref 22–32)
CREATININE: 1.56 mg/dL — AB (ref 0.61–1.24)
Calcium: 7.7 mg/dL — ABNORMAL LOW (ref 8.9–10.3)
GFR calc Af Amer: 56 mL/min — ABNORMAL LOW (ref >60)
GFR calc non Af Amer: 48 mL/min — ABNORMAL LOW (ref >60)
GLUCOSE: 94 mg/dL (ref 65–99)
Potassium: 3.9 mmol/L (ref 3.5–5.1)
Sodium: 136 mmol/L (ref 135–145)

## 2017-04-11 LAB — PROTIME-INR
INR: 0.97
Prothrombin Time: 12.9 seconds (ref 11.4–15.2)

## 2017-04-11 LAB — APTT: aPTT: 37 seconds — ABNORMAL HIGH (ref 24–36)

## 2017-04-11 MED ORDER — HYDROMORPHONE HCL-NACL 0.5-0.9 MG/ML-% IV SOSY
0.5000 mg | PREFILLED_SYRINGE | INTRAVENOUS | Status: DC | PRN
  Administered 2017-04-11: 0.5 mg via INTRAVENOUS
  Administered 2017-04-11: 1 mg via INTRAVENOUS
  Administered 2017-04-11: 0.5 mg via INTRAVENOUS
  Administered 2017-04-11: 1 mg via INTRAVENOUS
  Administered 2017-04-11 (×2): 0.5 mg via INTRAVENOUS
  Administered 2017-04-12 (×2): 1 mg via INTRAVENOUS
  Filled 2017-04-11: qty 1
  Filled 2017-04-11: qty 2
  Filled 2017-04-11: qty 1
  Filled 2017-04-11: qty 2
  Filled 2017-04-11: qty 1
  Filled 2017-04-11: qty 2
  Filled 2017-04-11: qty 1
  Filled 2017-04-11: qty 2

## 2017-04-11 MED ORDER — ONDANSETRON HCL 4 MG/2ML IJ SOLN
4.0000 mg | INTRAMUSCULAR | Status: DC | PRN
  Administered 2017-04-11 – 2017-04-13 (×8): 4 mg via INTRAVENOUS
  Filled 2017-04-11 (×8): qty 2

## 2017-04-11 MED ORDER — DEXTROSE-NACL 5-0.45 % IV SOLN
INTRAVENOUS | Status: DC
  Administered 2017-04-11 – 2017-04-13 (×6): via INTRAVENOUS

## 2017-04-11 MED ORDER — CHOLECALCIFEROL 10 MCG (400 UNIT) PO TABS
400.0000 [IU] | ORAL_TABLET | Freq: Every day | ORAL | Status: DC
  Administered 2017-04-11 – 2017-04-13 (×3): 400 [IU] via ORAL
  Filled 2017-04-11 (×3): qty 1

## 2017-04-11 MED ORDER — ZOLPIDEM TARTRATE 5 MG PO TABS
5.0000 mg | ORAL_TABLET | Freq: Every evening | ORAL | Status: DC | PRN
  Administered 2017-04-11: 5 mg via ORAL
  Filled 2017-04-11: qty 1

## 2017-04-11 MED ORDER — PREDNISONE 5 MG PO TABS
5.0000 mg | ORAL_TABLET | Freq: Every day | ORAL | Status: DC
  Administered 2017-04-11 – 2017-04-13 (×3): 5 mg via ORAL
  Filled 2017-04-11 (×3): qty 1

## 2017-04-11 MED ORDER — TAMSULOSIN HCL 0.4 MG PO CAPS
0.4000 mg | ORAL_CAPSULE | Freq: Every day | ORAL | Status: DC
  Administered 2017-04-11 – 2017-04-13 (×3): 0.4 mg via ORAL
  Filled 2017-04-11 (×3): qty 1

## 2017-04-11 MED ORDER — PROMETHAZINE HCL 25 MG/ML IJ SOLN
12.5000 mg | Freq: Four times a day (QID) | INTRAMUSCULAR | Status: DC | PRN
  Administered 2017-04-11 – 2017-04-12 (×2): 12.5 mg via INTRAVENOUS
  Filled 2017-04-11: qty 1

## 2017-04-11 MED ORDER — METHOTREXATE SODIUM CHEMO INJECTION 250 MG/10ML
25.0000 mg | INTRAMUSCULAR | Status: DC
  Administered 2017-04-11: 25 mg via SUBCUTANEOUS
  Filled 2017-04-11 (×2): qty 1

## 2017-04-11 MED ORDER — KETOROLAC TROMETHAMINE 30 MG/ML IJ SOLN
15.0000 mg | Freq: Four times a day (QID) | INTRAMUSCULAR | Status: DC
  Administered 2017-04-11 (×2): 15 mg via INTRAVENOUS
  Filled 2017-04-11 (×2): qty 1

## 2017-04-11 MED ORDER — OXYCODONE HCL 5 MG PO TABS
5.0000 mg | ORAL_TABLET | ORAL | Status: DC | PRN
  Administered 2017-04-12: 10 mg via ORAL
  Filled 2017-04-11: qty 2

## 2017-04-11 MED ORDER — FOLIC ACID 1 MG PO TABS
1.0000 mg | ORAL_TABLET | Freq: Every day | ORAL | Status: DC
  Administered 2017-04-11 – 2017-04-13 (×3): 1 mg via ORAL
  Filled 2017-04-11 (×3): qty 1

## 2017-04-11 MED ORDER — POTASSIUM CHLORIDE CRYS ER 20 MEQ PO TBCR
20.0000 meq | EXTENDED_RELEASE_TABLET | Freq: Every day | ORAL | Status: DC
  Administered 2017-04-11 – 2017-04-13 (×3): 20 meq via ORAL
  Filled 2017-04-11 (×3): qty 1

## 2017-04-11 MED ORDER — SERTRALINE HCL 50 MG PO TABS
50.0000 mg | ORAL_TABLET | Freq: Every day | ORAL | Status: DC
  Administered 2017-04-11 – 2017-04-12 (×2): 50 mg via ORAL
  Filled 2017-04-11 (×2): qty 1

## 2017-04-11 MED ORDER — ACETAMINOPHEN 500 MG PO TABS
1000.0000 mg | ORAL_TABLET | Freq: Four times a day (QID) | ORAL | Status: DC
  Administered 2017-04-11 – 2017-04-13 (×8): 1000 mg via ORAL
  Filled 2017-04-11 (×9): qty 2

## 2017-04-11 MED ORDER — DIPHENOXYLATE-ATROPINE 2.5-0.025 MG PO TABS
1.0000 | ORAL_TABLET | Freq: Four times a day (QID) | ORAL | Status: DC
  Administered 2017-04-11 – 2017-04-13 (×7): 1 via ORAL
  Filled 2017-04-11 (×7): qty 1

## 2017-04-11 MED ORDER — FAMOTIDINE 20 MG PO TABS
20.0000 mg | ORAL_TABLET | Freq: Two times a day (BID) | ORAL | Status: DC
  Administered 2017-04-11 – 2017-04-13 (×6): 20 mg via ORAL
  Filled 2017-04-11 (×6): qty 1

## 2017-04-11 NOTE — Progress Notes (Signed)
Assumed care of patient at 1700. Agree with previous Nurse assessment.  Barbee [place]ropshire. Brigitte Pulse, RN

## 2017-04-11 NOTE — Progress Notes (Signed)
Please call inpatient nurse at [facility] for report. Number is [phone]

## 2017-04-11 NOTE — Progress Notes (Signed)
MD clarified Methotrexate.Marland KitchenMarland KitchenOK to administered, pharmacy notified. SRP, RN

## 2017-04-11 NOTE — Consult Note (Signed)
Chief Complaint: Patient was seen in consultation today for left percutaneous nephrostomy placement Chief Complaint  Patient presents with  . Flank Pain   at the request of Dr PFilippou  Referring Physician(s): Dr Jearl Klinefelter  Supervising Physician: Marybelle Killings  Patient Status: Gregory [name] - In-pt  History of Present Illness: Gregory [name] is a 56 y.o. male   Pt Hx Crohns dz on Methotrexate---subtotal colectomy Hx nephrolithiasis Large left renal stone and plan for L PCNL 8/23 with Dr Rolm Bookbinder to ED last night with uncontrolled pain N/V; dizzy  CT yesterday: IMPRESSION: New LEFT hydronephrosis secondary to a 14 x 10 x 23 mm diameter LEFT UPJ calculus. Additional tiny nonobstructing LEFT renal calculi. Chronic dilatation of a segment of small bowel adjacent to an anastomosis question atonic or down admitted segment. Chronic bowel wall thickening of a small bowel loop in the central pelvis consistent with history of Crohn's disease. Mild biliary dilatation with CBD appearing larger than was seen on the prior exam, recommend correlation with LFTs.  Now request for L PCN for 8/13 Dr Barbie Banner has reviewed imaging and approves procedure   Past Medical History:  Diagnosis Date  . Acute endocarditis   . Crohn's disease ([place])   . GERD (gastroesophageal reflux disease)   . Short gut syndrome     Past Surgical History:  Procedure Laterality Date  . ILEOSTOMY    . SMALL INTESTINE SURGERY    . SUBTOTAL COLECTOMY      Allergies: Humira [adalimumab]; Penicillins; and Remicade [infliximab]  Medications: Prior to Admission medications   Medication Sig Start Date End Date Taking? Authorizing Provider  cholecalciferol (VITAMIN D) 400 units TABS tablet Take 400 Units by mouth daily.   Yes [provider]  codeine 30 MG tablet Take 1 tablet (30 mg total) by mouth 2 (two) times daily. 02/23/17  Yes Armbruster, Renelda Loma, MD  diphenoxylate-atropine (LOMOTIL) 2.5-0.025 MG  tablet Take 1 tablet by mouth 4 (four) times daily. 02/23/17  Yes Armbruster, Renelda Loma, MD  folic acid (FOLVITE) 1 MG tablet Take 1 tablet (1 mg total) by mouth daily. 03/01/17  Yes Armbruster, Renelda Loma, MD  methotrexate 250 MG/10ML injection Inject 25 mg into the skin once a week.  03/10/17  Yes [provider]  ondansetron (ZOFRAN-ODT) 4 MG disintegrating tablet Take 1 tablet by mouth every 8 (eight) hours as needed for nausea or vomiting.  11/24/16  Yes [provider]  potassium chloride SA (K-DUR,KLOR-CON) 20 MEQ tablet Take 1 tablet (20 mEq total) by mouth as directed. Patient taking differently: Take 20 mEq by mouth as directed. Take 43mq Mondays and Thursdays 04/02/17  Yes Armbruster, SRenelda Loma MD  predniSONE (DELTASONE) 10 MG tablet Take 20 mg by mouth for 1 week, 15 mg daily for 1 week, 10 mg daily for 1 week, then 5 mg a day Patient taking differently: Take 5 mg by mouth daily. Take 20 mg by mouth for 1 week, 15 mg daily for 1 week, 10 mg daily for 1 week, then 5 mg a day 03/09/17  Yes Armbruster, SRenelda Loma MD  ranitidine (ZANTAC) 150 MG tablet Take 1 tablet (150 mg total) by mouth 2 (two) times daily. 03/09/17  Yes Armbruster, SRenelda Loma MD  tamsulosin (FLOMAX) 0.4 MG CAPS capsule Take 0.4 mg by mouth daily. 04/05/17  Yes [provider]  traMADol (ULTRAM) 50 MG tablet Take 50-150 mg by mouth every 6 (six) hours as needed. 04/09/17  Yes [provider]  sertraline (ZOLOFT) 50 MG tablet Take 50 mg by mouth daily.    [provider]     Family History  Problem Relation Age of Onset  . Heart disease Mother   . Diabetes Father   . Heart disease Father   . Melanoma Sister   . Crohn's disease Brother   . Inflammatory bowel disease Brother   . Diabetes Paternal Grandmother     Social History   Social History  . Marital status: Divorced    Spouse name: N/A  . Number of children: N/A  . Years of education: N/A   Occupational History    . Retired    Social History Main Topics  . Smoking status: Never Smoker  . Smokeless tobacco: Never Used  . Alcohol use No  . Drug use: No  . Sexual activity: Not Asked   Other Topics Concern  . None   Social History Narrative  . None    Review of Systems: A 12 point ROS discussed and pertinent positives are indicated in the HPI above.  All other systems are negative.  Review of Systems  Constitutional: Positive for activity change and appetite change. Negative for fever.  Respiratory: Negative for shortness of breath.   Gastrointestinal: Positive for abdominal pain.  Musculoskeletal: Positive for back pain.  Neurological: Positive for weakness.  Psychiatric/Behavioral: Negative for behavioral problems and confusion.    Vital Signs: BP 117/79 (BP Location: Right Arm)   Pulse (!) 58   Temp 97.8 F (36.6 C) (Oral)   Resp 16   Ht 5' 11"  (1.803 m)   Wt 114 lb 13.8 oz (52.1 kg)   SpO2 100%   BMI 16.02 kg/m   Physical Exam  Constitutional: He is oriented to person, place, and time.  Cardiovascular: Normal rate and regular rhythm.   Pulmonary/Chest: Effort normal and breath sounds normal.  Abdominal: Soft. Bowel sounds are normal.  Musculoskeletal: Normal range of motion.  Neurological: He is alert and oriented to person, place, and time.  Skin: Skin is warm and dry.  Psychiatric: He has a normal mood and affect. His behavior is normal. Judgment and thought content normal.  Nursing note and vitals reviewed.   Mallampati Score:  MD Evaluation Airway: WNL Heart: WNL Abdomen: WNL Chest/ Lungs: WNL ASA  Classification: 3 Mallampati/Airway Score: One  Imaging: Ct Renal Stone Study  Result Date: 04/10/2017 CLINICAL DATA:  LEFT kidney stone planned for surgery on 04/22/2017, having severe pain today, nausea and vomiting, history Crohn's disease, GERD, short gut syndrome EXAM: CT ABDOMEN AND PELVIS WITHOUT CONTRAST TECHNIQUE: Multidetector CT imaging of the abdomen  and pelvis was performed following the standard protocol without IV contrast. Sagittal and coronal MPR images reconstructed from axial data set. Oral contrast was not administered for this indication. COMPARISON:  03/05/2017 FINDINGS: Lower chest: Blebs at lung bases bilaterally. Dependent atelectasis LEFT lower lobe. Hepatobiliary: Post cholecystectomy. Intrahepatic and extrahepatic biliary dilatation again identified, CBD 9 mm, 6 mm previously. No definite focal hepatic lesions. Pancreas: Normal appearance Spleen: Normal appearance Adrenals/Urinary Tract: LEFT hydronephrosis secondary to a large 14 x 10 x 23 mm LEFT UPJ calculus. Additional tiny nonobstructing LEFT renal calculi. Mild perinephric and peripelvic edema LEFT kidney. RIGHT kidney unremarkable. Bladder and ureters unremarkable. Stomach/Bowel: Bowel loops suboptimally assessed due to lack of IV and oral contrast. LEFT lower quadrant colostomy. Hartmann pouch. Dilated segment of bowel in the mid and LEFT abdomen unchanged from previous exams question atonic or denervated segment adjacent to anastomosis. Segment  of small bowel wall thickening is again identified in the central pelvis unchanged. Vascular/Lymphatic: Aorta normal caliber. Scattered normal sized mesenteric nodes. Reproductive: Prostate gland 4.6 x 3.3 cm. Other: No free air free fluid.  No hernia. Musculoskeletal: Bones unremarkable. IMPRESSION: New LEFT hydronephrosis secondary to a 14 x 10 x 23 mm diameter LEFT UPJ calculus. Additional tiny nonobstructing LEFT renal calculi. Chronic dilatation of a segment of small bowel adjacent to an anastomosis question atonic or down admitted segment. Chronic bowel wall thickening of a small bowel loop in the central pelvis consistent with history of Crohn's disease. Mild biliary dilatation with CBD appearing larger than was seen on the prior exam, recommend correlation with LFTs. Electronically Signed   By: Lavonia Dana M.D.   On: 04/10/2017 18:22     Labs:  CBC:  Recent Labs  02/23/17 1528 04/10/17 1628  WBC 14.4* 8.3  HGB 13.3 11.0*  HCT 41.1 34.0*  PLT 317.0 292    COAGS:  Recent Labs  04/11/17 0424  INR 0.97  APTT 37*    BMP:  Recent Labs  02/23/17 1528 04/10/17 1628 04/11/17 0424  NA 138 134* 136  K 3.7 4.0 3.9  CL 104 100* 105  CO2 27 27 24   GLUCOSE 123* 133* 94  BUN 11 9 11   CALCIUM 8.9 8.5* 7.7*  CREATININE 1.13 1.31* 1.56*  GFRNONAA  --  60* 48*  GFRAA  --  >60 56*    LIVER FUNCTION TESTS:  Recent Labs  02/23/17 1528 04/10/17 1628  BILITOT 0.6 0.7  AST 14 15  ALT 15 13*  ALKPHOS 62 63  PROT 6.5 6.2*  ALBUMIN 3.3* 2.4*    TUMOR MARKERS: No results for input(s): AFPTM, CEA, CA199, CHROMGRNA in the last 8760 hours.  Assessment and Plan:  Left renal stone Was scheduled for L PCNL with Dr Louis Meckel 8/23 Admitted for pain control over weekend Now scheduled for L PCN in IR 8/13 Risks and benefits discussed with the patient including, but not limited to infection, bleeding, significant bleeding causing loss or decrease in renal function or damage to adjacent structures.  All of the patient's questions were answered, patient is agreeable to proceed. Consent signed and in chart.  Thank you for this interesting consult.  I greatly enjoyed meeting Kharee Lesesne and look forward to participating in their care.  A copy of this report was sent to the requesting provider on this date.  Electronically Signed: Lavonia Drafts, PA-C 04/11/2017, 11:28 AM   I spent a total of 40 Minutes    in face to face in clinical consultation, greater than 50% of which was counseling/coordinating care for Left percutaneous nephrostomy placement

## 2017-04-11 NOTE — Progress Notes (Signed)
Followup with pharmacy concerning Methotrexate injection. Will call MD to clarify whether to administer med to patient while in the hospital. SPR, RN

## 2017-04-11 NOTE — Progress Notes (Signed)
Pt states take Zoloft for anxiety, MD called and MAR updated to be given HS and also updated to administer Methotrexate,  SRP, RN

## 2017-04-12 ENCOUNTER — Inpatient Hospital Stay (HOSPITAL_COMMUNITY): Payer: Medicare Other

## 2017-04-12 ENCOUNTER — Encounter (HOSPITAL_COMMUNITY): Payer: Self-pay | Admitting: Interventional Radiology

## 2017-04-12 HISTORY — PX: IR NEPHROSTOMY PLACEMENT LEFT: IMG6063

## 2017-04-12 HISTORY — PX: IR NEPHROSTOMY EXCHANGE LEFT: IMG6069

## 2017-04-12 LAB — CBC
HCT: 34.1 % — ABNORMAL LOW (ref 39.0–52.0)
HEMOGLOBIN: 10.9 g/dL — AB (ref 13.0–17.0)
MCH: 30.2 pg (ref 26.0–34.0)
MCHC: 32 g/dL (ref 30.0–36.0)
MCV: 94.5 fL (ref 78.0–100.0)
PLATELETS: 299 10*3/uL (ref 150–400)
RBC: 3.61 MIL/uL — ABNORMAL LOW (ref 4.22–5.81)
RDW: 16.1 % — ABNORMAL HIGH (ref 11.5–15.5)
WBC: 6.9 10*3/uL (ref 4.0–10.5)

## 2017-04-12 LAB — BASIC METABOLIC PANEL
Anion gap: 6 (ref 5–15)
BUN: 13 mg/dL (ref 6–20)
CALCIUM: 7.9 mg/dL — AB (ref 8.9–10.3)
CHLORIDE: 112 mmol/L — AB (ref 101–111)
CO2: 20 mmol/L — AB (ref 22–32)
CREATININE: 1.87 mg/dL — AB (ref 0.61–1.24)
GFR calc Af Amer: 45 mL/min — ABNORMAL LOW (ref >60)
GFR calc non Af Amer: 39 mL/min — ABNORMAL LOW (ref >60)
GLUCOSE: 112 mg/dL — AB (ref 65–99)
Potassium: 3.9 mmol/L (ref 3.5–5.1)
Sodium: 138 mmol/L (ref 135–145)

## 2017-04-12 LAB — URINE CULTURE: CULTURE: NO GROWTH

## 2017-04-12 MED ORDER — LIDOCAINE-EPINEPHRINE (PF) 2 %-1:200000 IJ SOLN
INTRAMUSCULAR | Status: DC | PRN
  Administered 2017-04-12: 10 mL

## 2017-04-12 MED ORDER — HYDROMORPHONE HCL-NACL 0.5-0.9 MG/ML-% IV SOSY
0.5000 mg | PREFILLED_SYRINGE | INTRAVENOUS | Status: DC | PRN
  Administered 2017-04-12: 1 mg via INTRAVENOUS
  Filled 2017-04-12: qty 2

## 2017-04-12 MED ORDER — FENTANYL CITRATE (PF) 100 MCG/2ML IJ SOLN
INTRAMUSCULAR | Status: AC | PRN
  Administered 2017-04-12 (×3): 50 ug via INTRAVENOUS

## 2017-04-12 MED ORDER — IOPAMIDOL (ISOVUE-300) INJECTION 61%
50.0000 mL | Freq: Once | INTRAVENOUS | Status: AC | PRN
  Administered 2017-04-12: 15 mL via INTRAVENOUS

## 2017-04-12 MED ORDER — FENTANYL CITRATE (PF) 100 MCG/2ML IJ SOLN
INTRAMUSCULAR | Status: AC
  Filled 2017-04-12: qty 4

## 2017-04-12 MED ORDER — LIDOCAINE-EPINEPHRINE (PF) 2 %-1:200000 IJ SOLN
INTRAMUSCULAR | Status: AC
  Filled 2017-04-12: qty 20

## 2017-04-12 MED ORDER — SODIUM CHLORIDE 0.9% FLUSH
5.0000 mL | Freq: Three times a day (TID) | INTRAVENOUS | Status: DC
  Administered 2017-04-12 – 2017-04-13 (×2): 5 mL via INTRAVENOUS

## 2017-04-12 MED ORDER — IOPAMIDOL (ISOVUE-300) INJECTION 61%
INTRAVENOUS | Status: AC
  Administered 2017-04-12: 15 mL via INTRAVENOUS
  Filled 2017-04-12: qty 50

## 2017-04-12 MED ORDER — NALOXONE HCL 0.4 MG/ML IJ SOLN
INTRAMUSCULAR | Status: AC
  Filled 2017-04-12: qty 1

## 2017-04-12 MED ORDER — CIPROFLOXACIN IN D5W 400 MG/200ML IV SOLN
INTRAVENOUS | Status: AC
  Administered 2017-04-12: 400 mg via INTRAVENOUS
  Filled 2017-04-12: qty 200

## 2017-04-12 MED ORDER — PROMETHAZINE HCL 25 MG/ML IJ SOLN
INTRAMUSCULAR | Status: AC
  Filled 2017-04-12: qty 1

## 2017-04-12 MED ORDER — LIDOCAINE-EPINEPHRINE (PF) 2 %-1:200000 IJ SOLN
INTRAMUSCULAR | Status: AC | PRN
  Administered 2017-04-12: 20 mL

## 2017-04-12 MED ORDER — MIDAZOLAM HCL 2 MG/2ML IJ SOLN
INTRAMUSCULAR | Status: AC
  Filled 2017-04-12: qty 4

## 2017-04-12 MED ORDER — CIPROFLOXACIN IN D5W 400 MG/200ML IV SOLN
400.0000 mg | Freq: Once | INTRAVENOUS | Status: AC
  Administered 2017-04-12: 400 mg via INTRAVENOUS

## 2017-04-12 MED ORDER — MIDAZOLAM HCL 2 MG/2ML IJ SOLN
INTRAMUSCULAR | Status: AC | PRN
  Administered 2017-04-12 (×3): 1 mg via INTRAVENOUS

## 2017-04-12 NOTE — Procedures (Signed)
Pre Procedure Dx: Fractured L PCN Post Procedure Dx: Same  Successful left sided PCN exchange.    EBL: None   No immediate complications.   Ronny Bacon, MD Pager #: [phone]

## 2017-04-12 NOTE — Progress Notes (Signed)
Patient called out to this RN and stated he 'thinks he has pulled out the drain from his back'.  Patient's sheet completely soaked.  Tube appears to be intact internally, however it appears to have pulled apart from the port and is leaking profusely.  Spoke with Dr. Pascal Lux and attempted to tape at this site, but unable to do so d/t the positioning of the tubing and fluid leaking out of the site.  Patient uncomfortable with continuous leaking of tubing and adamant that tube is replaced.  Dr. Pascal Lux states patient to go down to IR for tube replacement at this time.

## 2017-04-12 NOTE — Procedures (Signed)
Pre Procedure Dx: Hydronephrosis Post Procedural Dx: Same  Successful [place] and fluoroscopic guided placement of a left sided PCN with end coiled and locked within the renal pelvis. PCN connected to gravity bag.  EBL: Minimal  Complications: None immediate.  Ronny Bacon, MD Pager #: [phone]

## 2017-04-12 NOTE — Progress Notes (Signed)
Patient back from PCN exchange. Patient states pain is 10/10.  Oxy 10 mg administered.  Will continue to monitor.

## 2017-04-12 NOTE — Sedation Documentation (Signed)
Patient c/o pain and discomfort. Meds given. Will continue to monitor.

## 2017-04-12 NOTE — Sedation Documentation (Signed)
Patient denies pain and is resting comfortably.  

## 2017-04-12 NOTE — Progress Notes (Signed)
Patient back from IR.  Patient stable; VSS (see flowsheet).  Family at bedside.  Will continue to monitor.

## 2017-04-12 NOTE — Progress Notes (Signed)
Urology Progress Note      Subjective: Nauseated this AM requiring IV PRNs. Continues to require IV dilaudid for pain control, though PO medication has not been trialed. Going for IR L PCN today. Urine culture pending.   Objective: Vital signs in last 24 hours: Temp:  [98.4 F (36.9 C)-98.6 F (37 C)] 98.4 F (36.9 C) (08/13 0537) Pulse Rate:  [62-75] 75 (08/13 0537) Resp:  [16-18] 18 (08/13 0537) BP: (103-115)/(56-71) 106/56 (08/13 0537) SpO2:  [98 %-99 %] 99 % (08/13 0537)  Intake/Output from previous day: 08/12 0701 - 08/13 0700 In: 2985 [P.O.:120; I.V.:2865] Out: 1150 [Urine:1150] Intake/Output this shift: No intake/output data recorded.  Physical Exam:  General: NAD, A&O, resting, appropriate HEENT: [place]/AT, EOMI, MMM Pulmonary: Normal work of breathing on RA Cardiovascular: HDS, adequate peripheral perfusion Abdomen: soft, NTTP, nondistended, no suprapubic fullness or tenderness GU: Left CVA tenderness Extremities: warm and well perfused, no edema Neuro: Appropriate, no focal neurological deficits  Lab Results:  Recent Labs  04/10/17 1628 04/12/17 0508  HGB 11.0* 10.9*  HCT 34.0* 34.1*   BMET  Recent Labs  04/11/17 0424 04/12/17 0508  NA 136 138  K 3.9 3.9  CL 105 112*  CO2 24 20*  GLUCOSE 94 112*  BUN 11 13  CREATININE 1.56* 1.87*  CALCIUM 7.7* 7.9*     Studies/Results: Ct Renal Stone Study  Result Date: 04/10/2017 CLINICAL DATA:  LEFT kidney stone planned for surgery on 04/22/2017, having severe pain today, nausea and vomiting, history Crohn's disease, GERD, short gut syndrome EXAM: CT ABDOMEN AND PELVIS WITHOUT CONTRAST TECHNIQUE: Multidetector CT imaging of the abdomen and pelvis was performed following the standard protocol without IV contrast. Sagittal and coronal MPR images reconstructed from axial data set. Oral contrast was not administered for this indication. COMPARISON:  03/05/2017 FINDINGS: Lower chest: Blebs at lung bases bilaterally.  Dependent atelectasis LEFT lower lobe. Hepatobiliary: Post cholecystectomy. Intrahepatic and extrahepatic biliary dilatation again identified, CBD 9 mm, 6 mm previously. No definite focal hepatic lesions. Pancreas: Normal appearance Spleen: Normal appearance Adrenals/Urinary Tract: LEFT hydronephrosis secondary to a large 14 x 10 x 23 mm LEFT UPJ calculus. Additional tiny nonobstructing LEFT renal calculi. Mild perinephric and peripelvic edema LEFT kidney. RIGHT kidney unremarkable. Bladder and ureters unremarkable. Stomach/Bowel: Bowel loops suboptimally assessed due to lack of IV and oral contrast. LEFT lower quadrant colostomy. Hartmann pouch. Dilated segment of bowel in the mid and LEFT abdomen unchanged from previous exams question atonic or denervated segment adjacent to anastomosis. Segment of small bowel wall thickening is again identified in the central pelvis unchanged. Vascular/Lymphatic: Aorta normal caliber. Scattered normal sized mesenteric nodes. Reproductive: Prostate gland 4.6 x 3.3 cm. Other: No free air free fluid.  No hernia. Musculoskeletal: Bones unremarkable. IMPRESSION: New LEFT hydronephrosis secondary to a 14 x 10 x 23 mm diameter LEFT UPJ calculus. Additional tiny nonobstructing LEFT renal calculi. Chronic dilatation of a segment of small bowel adjacent to an anastomosis question atonic or down admitted segment. Chronic bowel wall thickening of a small bowel loop in the central pelvis consistent with history of Crohn's disease. Mild biliary dilatation with CBD appearing larger than was seen on the prior exam, recommend correlation with LFTs. Electronically Signed   By: Lavonia Dana M.D.   On: 04/10/2017 18:22    Assessment/Plan:  56 y.o. male with hx Crohn's and nephrolithiasis with 2.3cm x 1.4cm obstructing left UPJ stone and moderate hydronephrosis, moved and newly obstructing compared to CT scan 1 month ago. Admitted  for pain control, requiring IV nacrotics and continuing to have  pain and nausea.  - IR L PCN today - likely d/c home after placement     LOS: 1 day   Jaxxon Naeem L 04/12/2017, 8:12 AM

## 2017-04-13 LAB — BASIC METABOLIC PANEL
Anion gap: 6 (ref 5–15)
BUN: 11 mg/dL (ref 6–20)
CO2: 22 mmol/L (ref 22–32)
CREATININE: 1.43 mg/dL — AB (ref 0.61–1.24)
Calcium: 7.8 mg/dL — ABNORMAL LOW (ref 8.9–10.3)
Chloride: 110 mmol/L (ref 101–111)
GFR calc Af Amer: >60 mL/min (ref >60)
GFR calc non Af Amer: 54 mL/min — ABNORMAL LOW (ref >60)
Glucose, Bld: 125 mg/dL — ABNORMAL HIGH (ref 65–99)
POTASSIUM: 3.6 mmol/L (ref 3.5–5.1)
Sodium: 138 mmol/L (ref 135–145)

## 2017-04-13 MED ORDER — ONDANSETRON 4 MG PO TBDP: 4.0000 mg | ORAL_TABLET | Freq: Three times a day (TID) | ORAL | 0 refills | Status: DC | PRN

## 2017-04-13 MED ORDER — ACETAMINOPHEN 500 MG PO TABS: 1000.0000 mg | ORAL_TABLET | Freq: Four times a day (QID) | ORAL | 0 refills | Status: DC

## 2017-04-13 NOTE — Progress Notes (Signed)
Patient discharged, reviewed discharge instructions with patient.  He verbalized agreement and understanding.  Patient left with all belongings to go home in private vehicle with sister.

## 2017-04-13 NOTE — Discharge Instructions (Signed)

## 2017-04-13 NOTE — Progress Notes (Signed)
Chief Complaint: Patient was seen today for follow up (L)PCN placement  Supervising Physician: Aletta Edouard  Patient Status: [facility] - In-pt  Subjective: S/p (L)PCN placement. Dr. Pascal Lux to come back in to exchange last night due to inadvertent fracture of the tube as the pt was moving in bed. He is still pretty sore this am, but feels okay otherwise. No labs drawn today  Objective: Physical Exam: BP 125/80 (BP Location: Right Arm)   Pulse 72   Temp 98.2 F (36.8 C) (Oral)   Resp 14   Ht 5' 11"  (1.803 m)   Wt 114 lb 13.8 oz (52.1 kg)   SpO2 98%   BMI 16.02 kg/m  (L)PCN intact, site clean. Clear UOP in bag   Current Facility-Administered Medications:  .  acetaminophen (TYLENOL) tablet 1,000 mg, 1,000 mg, Oral, Q6H, Filippou, Braxton Feathers, MD, 1,000 mg at 04/13/17 0532 .  cholecalciferol (VITAMIN D) tablet 400 Units, 400 Units, Oral, Daily, Filippou, Braxton Feathers, MD, 400 Units at 04/12/17 1028 .  dextrose 5 %-0.45 % sodium chloride infusion, , Intravenous, Continuous, Filippou, Braxton Feathers, MD, Last Rate: 100 mL/hr at 04/13/17 0533 .  diphenoxylate-atropine (LOMOTIL) 2.5-0.025 MG per tablet 1 tablet, 1 tablet, Oral, QID, Filippou, Braxton Feathers, MD, 1 tablet at 04/12/17 2107 .  famotidine (PEPCID) tablet 20 mg, 20 mg, Oral, BID, Filippou, Braxton Feathers, MD, 20 mg at 04/12/17 2107 .  folic acid (FOLVITE) tablet 1 mg, 1 mg, Oral, Daily, Filippou, Braxton Feathers, MD, 1 mg at 04/12/17 1027 .  HYDROmorphone (DILAUDID) injection 0.5-1 mg, 0.5-1 mg, Intravenous, Q2H PRN, Greggory Keen, MD, 1 mg at 04/12/17 1932 .  lidocaine-EPINEPHrine (XYLOCAINE W/EPI) 2 %-1:200000 (PF) injection, , , PRN, Sandi Mariscal, MD, 10 mL at 04/12/17 1826 .  methotrexate chemo injection 25 mg, 25 mg, Subcutaneous, Weekly, Filippou, Braxton Feathers, MD, 25 mg at 04/11/17 2104 .  ondansetron (ZOFRAN) injection 4 mg, 4 mg, Intravenous, Q4H PRN, Filippou, Braxton Feathers, MD, 4 mg at 04/13/17 0805 .  oxyCODONE (Oxy IR/ROXICODONE) immediate  release tablet 5-10 mg, 5-10 mg, Oral, Q4H PRN, Filippou, Braxton Feathers, MD, 10 mg at 04/12/17 1852 .  potassium chloride SA (K-DUR,KLOR-CON) CR tablet 20 mEq, 20 mEq, Oral, Daily, Filippou, Braxton Feathers, MD, 20 mEq at 04/12/17 1028 .  predniSONE (DELTASONE) tablet 5 mg, 5 mg, Oral, Q breakfast, Filippou, Braxton Feathers, MD, 5 mg at 04/13/17 0805 .  promethazine (PHENERGAN) injection 12.5 mg, 12.5 mg, Intravenous, Q6H PRN, Filippou, Pauline L, MD, 12.5 mg at 04/12/17 1415 .  sertraline (ZOLOFT) tablet 50 mg, 50 mg, Oral, QHS, McKenzie, Candee Furbish, MD, 50 mg at 04/12/17 2107 .  sodium chloride flush (NS) 0.9 % injection 5 mL, 5 mL, Intravenous, Q8H, Sandi Mariscal, MD, 5 mL at 04/13/17 0533 .  tamsulosin (FLOMAX) capsule 0.4 mg, 0.4 mg, Oral, Daily, Filippou, Pauline L, MD, 0.4 mg at 04/12/17 1028 .  zolpidem (AMBIEN) tablet 5 mg, 5 mg, Oral, QHS PRN,MR X 1, Filippou, Pauline L, MD, 5 mg at 04/11/17 2146  Labs: CBC  Recent Labs  04/10/17 1628 04/12/17 0508  WBC 8.3 6.9  HGB 11.0* 10.9*  HCT 34.0* 34.1*  PLT 292 299   BMET  Recent Labs  04/11/17 0424 04/12/17 0508  NA 136 138  K 3.9 3.9  CL 105 112*  CO2 24 20*  GLUCOSE 94 112*  BUN 11 13  CREATININE 1.56* 1.87*  CALCIUM 7.7* 7.9*   LFT  Recent Labs  04/10/17 1628  PROT  6.2*  ALBUMIN 2.4*  AST 15  ALT 13*  ALKPHOS 63  BILITOT 0.7  LIPASE 23   PT/INR  Recent Labs  04/11/17 0424  LABPROT 12.9  INR 0.97     Studies/Results: Ir Nephrostomy Placement Left  Result Date: 04/12/2017 INDICATION: History of left-sided nephrolithiasis. Patient was to undergo planned left-sided percutaneous nephrolithotomy access however was admitted to the hospital this weekend with left-sided flank pain and impending urosepsis and as such, request made for placement of a left-sided nephrostomy catheter. EXAM: 1. ULTRASOUND GUIDANCE FOR PUNCTURE OF THE LEFT RENAL COLLECTING SYSTEM 2. LEFT PERCUTANEOUS NEPHROSTOMY TUBE PLACEMENT. COMPARISON:  CT  abdomen and pelvis - 04/10/2017 MEDICATIONS: Ciprofloxacin 400 mg IV;The antibiotic was administered in an appropriate time frame prior to skin puncture. ANESTHESIA/SEDATION: Moderate (conscious) sedation was employed during this procedure. A total of Versed 3 mg and Fentanyl 150 mcg was administered intravenously. Moderate Sedation Time: 26 minutes. The patient's level of consciousness and vital signs were monitored continuously by radiology nursing throughout the procedure under my direct supervision. CONTRAST:  20 mL Isovue 300 administered into the collecting system FLUOROSCOPY TIME:  4 minutes 6 seconds (40 mGy) COMPLICATIONS: None immediate. PROCEDURE: The procedure, risks, benefits, and alternatives were explained to the patient. Questions regarding the procedure were encouraged and answered. The patient understands and consents to the procedure. A timeout was performed prior to the initiation of the procedure. The left flank region was prepped with Betadine in a sterile fashion, and a sterile drape was applied covering the operative field. A sterile gown and sterile gloves were used for the procedure. Local anesthesia was provided with 1% Lidocaine with epinephrine. Preprocedural spot radiographic image was obtained of the left flank. Ultrasound was used to localize the left kidney. Under direct ultrasound guidance, a 21 gauge needle was advanced into the renal collecting system. An ultrasound image documentation was performed. Access within the collecting system was confirmed with the efflux of urine followed by contrast injection. Contrast injection demonstrated opacification of the left renal collecting system demonstrating access to the collecting system via the renal pelvis. As such, an opacified posterior inferior calyx within the left kidney was targeted fluoroscopically with an additional 22 gauge needle with special attention made to avoid the gaseous distended descending colon. Access to the  collecting system was confirmed with the efflux of a small amount of urine and advancement a Nitrex wire into the left renal pelvis. The tract was dilated with an Accustick stent. Over a guide wire, a 10-French percutaneous nephrostomy catheter was advanced into the collecting system where the coil was formed and locked. Contrast was injected and several sport radiographs were obtained in various obliquities confirming access. The catheter was secured at the skin with a Prolene retention suture and a gravity bag was placed. A dressing was placed. The patient tolerated procedure well without immediate postprocedural complication. FINDINGS: Preprocedural spot fluoroscopic image demonstrates an approximately 1.7 cm opacity overlying the expected location of left renal pelvis compatible with the known left renal pelvic stone. Ultrasound scanning demonstrates a moderately dilated left renal pelvis without significant caliectasis, though note, sonographic evaluation of the left kidney was markedly degraded secondary to obscuration due to gaseous distension of the descending colon. Initial access to the left renal collecting system was performed via ultrasound guidance however demonstrated puncture of the left renal pelvis. As such, with the collecting system opacified, a posterior inferior calix was targeted fluoroscopically allowing advancement of an 10-French percutaneous nephrostomy catheter under intermittent fluoroscopic guidance.  Contrast injection confirmed appropriate positioning. IMPRESSION: Successful ultrasound and fluoroscopic guided placement of a left sided 10 French PCN. This nephrostomy may be utilized during future plantar nephrolithotomy as clinically indicated. Electronically Signed   By: Sandi Mariscal M.D.   On: 04/12/2017 15:31   Ir Nephrostomy Exchange Left  Result Date: 04/12/2017 INDICATION: History of left-sided nephrolithiasis, post left-sided percutaneous nephrostomy catheter placement earlier  today though patient inadvertently fracture the external portion of the nephrostomy catheter. As such, patient returns today for fluoroscopic guided nephrostomy catheter exchange. EXAM: FLUOROSCOPIC GUIDED LEFT SIDED NEPHROSTOMY CATHETER EXCHANGE COMPARISON:  Ultrasound and fluoroscopic guided nephrostomy catheter placement - earlier same day; CT abdomen pelvis - 04/10/2017 CONTRAST:  15 mL Isovue-300 administered into the collecting system FLUOROSCOPY TIME:  54 seconds (5.4 mGy) COMPLICATIONS: None immediate. TECHNIQUE: Informed written consent was obtained from the patient after a discussion of the risks, benefits and alternatives to treatment. Questions regarding the procedure were encouraged and answered. A timeout was performed prior to the initiation of the procedure. The left flank and external portion of existing nephrostomy catheter were prepped and draped in the usual sterile fashion. A sterile drape was applied covering the operative field. Maximum barrier sterile technique with sterile gowns and gloves were used for the procedure. A timeout was performed prior to the initiation of the procedure. A pre procedural spot fluoroscopic image was obtained after contrast was injected via the existing nephrostomy catheter demonstrating appropriate positioning within the renal pelvis. The existing nephrostomy catheter was cut and cannulated with a Benson wire which was coiled within the renal pelvis. Under intermittent fluoroscopic guidance, the existing nephrostomy catheter was exchanged for a new 10.2 [place] all-purpose drainage catheter. Contrast injection confirmed appropriate positioning within the renal pelvis and a post exchange fluoroscopic image was obtained. The catheter was locked and secured to the skin with a suture. A dressing was placed. The patient tolerated the procedure well without immediate postprocedural complication. FINDINGS: The existing nephrostomy catheter is appropriately positioned and  functioning. After successful fluoroscopic guided exchange, the new nephrostomy catheter is coiled and locked within the left renal pelvis. IMPRESSION: Successful fluoroscopic guided exchange of left sided 10.2 French percutaneous nephrostomy catheter. Electronically Signed   By: Sandi Mariscal M.D.   On: 04/12/2017 18:36    Assessment/Plan: S/p (L)PCN 8/13 Routine drain care, IR following.     LOS: 2 days   I spent a total of 15 minutes in face to face in clinical consultation, greater than 50% of which was counseling/coordinating care for [facility]  Ascencion Dike PA-C 04/13/2017 9:26 AM

## 2017-04-13 NOTE — Progress Notes (Signed)
Initial Nutrition Assessment  DOCUMENTATION CODES:   Underweight, Severe malnutrition in context of chronic illness  INTERVENTION:   Ensure Enlive po BID, each supplement provides 350 kcal and 20 grams of protein  NUTRITION DIAGNOSIS:   Malnutrition (Severe) related to chronic illness (Crohn's Disease) as evidenced by energy intake < or equal to 50% for > or equal to 1 month, severe depletion of body fat, severe depletion of muscle mass.  GOAL:   Patient will meet greater than or equal to 90% of their needs  MONITOR:   PO intake, Supplement acceptance, Labs, Weight trends  REASON FOR ASSESSMENT:   Malnutrition Screening Tool   ASSESSMENT:   Pt with PMH of Chron's, short gut syndrome, and nephrolithiasis. Presents this left admission with UPJ stone with PCN placement and moderate hydronephrosis.   Pt reports having a loss in appetite for x2 weeks related to kidney stone pain. States before the stone his appetite was slightly better, but his Crohn's made it hard to tolerate certain foods. Pt trys very hard to gain wt, eating as much as he can in a day. Seems to understand the importance of gaining wt, but is frustrated with his Crohn's. Pt seeing someone for his Crohn's diagnosis, and is to begin a new medication that will help with PO toleration. Pt to discharge upon assessment, so we discussed certain foods that will help him gain wt, stressing the importance of protein for lean body mass gain. Will provide protein supplementation information in discharge packet.   Pt reports a UBW of 160 lb, the last time being at that wt is unknown. Records limited in wt history, but suspect pt has lost significant amount of wt.   Nutrition-Focused physical exam completed. Findings are severe fat depletion in all regions, severe muscle depletion in all regions, and no edema.   Medications reviewed and include: Vit D, folic acid, KDur, Prednisone Labs reviewed: CBG 94-133 Creatinine 1.43 (H)  Calcium 7.8 (L)  Diet Order:  Diet regular Room service appropriate? Yes; Fluid consistency: Thin Diet general  Skin:  Reviewed, no issues  Last BM:  04/12/17  Height:   Ht Readings from Last 1 Encounters:  04/11/17 5' 11"  (1.803 m)    Weight:   Wt Readings from Last 1 Encounters:  04/11/17 114 lb 13.8 oz (52.1 kg)    Ideal Body Weight:  78.2 kg  BMI:  Body mass index is 16.02 kg/m.  Estimated Nutritional Needs:   Kcal:  1900-2100 (36-40 kcal/kg)  Protein:  95-105 grams (1.8-2 g/kg)  Fluid:  >1.9 L/day  EDUCATION NEEDS:   Education needs addressed  [place], LDN Clinical Nutrition Pager # [phone]

## 2017-04-13 NOTE — Discharge Summary (Signed)
[facility] Discharge Summary  Admit date: 04/10/2017  Discharge date and time: 04/13/17   Discharge to: Home  Discharge Service: Urology  Discharge Attending Physician:  Dr. Nicolette Bang  Discharge  Diagnoses: Nephrolithiasis  Secondary Diagnosis: Active Problems:   Nephrolithiasis   OR Procedures:  None   Ancillary Procedures: Left percutaneous nephrostomy tube placement - VIR - 8/13  Discharge Day Services: The patient was seen and examined by the Urology team both in the morning and immediately prior to discharge.  Vital signs and laboratory values were stable and within normal limits.  The physical exam was benign and unchanged and all surgical wounds were examined.  Discharge instructions were explained and all questions answered.  Subjective  No acute events overnight. Pain Controlled. No fever or chills.  Objective No data found.  Total I/O In: 640 [P.O.:240; I.V.:400] Out: 325 [Stool:325]  General Appearance:        No acute distress Lungs:                 Normal work of breathing on room air Heart:                             Regular rate and rhythm Abdomen:                       Soft, non-tender, non-distended. Left nephrostomy tube with clear yellow urine in tubing and bag.  Extremities:                  Warm and well perfused    Hospital Course:   Patient presented 8/11 with intractable left flank pain and nausea/vomiting. Imaging demonstrated a 2.3cm x 1.4cm obstructing left UPJ stone and moderate hydronephrosis, which had moved and was noted to be newly obstructing compared to CT scan 1 month ago. Patient is already scheduled for PCNL with Dr. Louis Meckel on 8/23. He was admitted to the hospital, and underwent left nephrostomy tube placement on 8/13.    The patient did well postprocedurally.  The patient's diet was slowly advanced and at the time of discharge was tolerating a regular diet.  The patient was discharged home HD3, at which point was  tolerating a regular solid diet, was able to void spontaneously, have adequate pain control with P.O. pain medication, and could ambulate without difficulty. The patient will follow up with [place] for his scheduled surgery on 8/23.  A urine culture was sent while patient's was in house to serve as a preoperative urine culture, which was no growth prior to discharge.   Condition at Discharge: Improved  Discharge Medications:  Allergies as of 04/13/2017      Reactions   Humira [adalimumab]    Intolerance   Penicillins    Remicade [infliximab]    Intolerance      Medication List    TAKE these medications   acetaminophen 500 MG tablet Commonly known as:  TYLENOL Take 2 tablets (1,000 mg total) by mouth every 6 (six) hours.   cholecalciferol 400 units Tabs tablet Commonly known as:  VITAMIN D Take 400 Units by mouth daily.   codeine 30 MG tablet Take 1 tablet (30 mg total) by mouth 2 (two) times daily.   diphenoxylate-atropine 2.5-0.025 MG tablet Commonly known as:  LOMOTIL Take 1 tablet by mouth 4 (four) times daily.   folic acid 1 MG tablet Commonly known as:  FOLVITE Take 1 tablet (1 mg  total) by mouth daily.   methotrexate 250 MG/10ML injection Inject 25 mg into the skin once a week.   ondansetron 4 MG disintegrating tablet Commonly known as:  ZOFRAN-ODT Take 1 tablet (4 mg total) by mouth every 8 (eight) hours as needed for nausea or vomiting.   potassium chloride SA 20 MEQ tablet Commonly known as:  K-DUR,KLOR-CON Take 1 tablet (20 mEq total) by mouth as directed. What changed:  additional instructions   predniSONE 10 MG tablet Commonly known as:  DELTASONE Take 20 mg by mouth for 1 week, 15 mg daily for 1 week, 10 mg daily for 1 week, then 5 mg a day What changed:  how much to take  how to take this  when to take this  additional instructions   ranitidine 150 MG tablet Commonly known as:  ZANTAC Take 1 tablet (150 mg total) by mouth 2 (two) times daily.    sertraline 50 MG tablet Commonly known as:  ZOLOFT Take 50 mg by mouth daily.   tamsulosin 0.4 MG Caps capsule Commonly known as:  FLOMAX Take 0.4 mg by mouth daily.   traMADol 50 MG tablet Commonly known as:  ULTRAM Take 50-150 mg by mouth every 6 (six) hours as needed.        Pending Test Results: none  Discharge Instructions:  Discharge Instructions    Call MD for:    Complete by:  As directed    Blood in urine with clot passage   Call MD for:  persistant nausea and vomiting    Complete by:  As directed    Call MD for:  redness, tenderness, or signs of infection (pain, swelling, redness, odor or green/yellow discharge around incision site)    Complete by:  As directed    Call MD for:  temperature >100.4    Complete by:  As directed    Diet general    Complete by:  As directed    Discharge instructions    Complete by:  As directed    Continue nephrostomy tube to drainage. Normal to have intermittent blood in the urine, either from the tube or from your urinating. Call us if you begin to pass clots. You may see some blood in the urine and may have some burning with urination for 48-72 hours. You also may notice that you have to urinate more frequently or urgently after your procedure which is normal.  You should call should you develop an inability urinate, fever > 101, persistent nausea and vomiting that prevents you from eating or drinking to stay hydrated.  If you have a stent, you will likely urinate more frequently and urgently until the stent is removed and you may experience some discomfort/pain in the lower abdomen and flank especially when urinating. You may take pain medication prescribed to you if needed for pain. You may also intermittently have blood in the urine until the stent is removed. If you have a catheter, you will be taught how to take care of the catheter by the nursing staff prior to discharge from the hospital.  You may periodically feel a strong urge  to void with the catheter in place.  This is a bladder spasm and most often can occur when having a bowel movement or moving around. It is typically self-limited and usually will stop after a few minutes.  You may use some Vaseline or Neosporin around the tip of the catheter to reduce friction at the tip of the penis. You may  also see some blood in the urine.  A very small amount of blood can make the urine look quite red.  As long as the catheter is draining well, there usually is not a problem.  However, if the catheter is not draining well and is bloody, you should call the office [phone]) to notify us.   Increase activity slowly    Complete by:  As directed

## 2017-04-14 NOTE — Patient Instructions (Addendum)
Bravery Ketcham  04/14/2017   Your procedure is scheduled on: 04-22-17  Report to [place]  elevators to 3rd floor to  [place] at [place].    Call this number if you have problems the morning of surgery [phone]    Remember: ONLY 1 PERSON MAY GO WITH YOU TO SHORT STAY TO GET  READY MORNING OF YOUR SURGERY.  Do not eat food or drink liquids :After Midnight.     Take these medicines the morning of surgery with A SIP OF WATER: sertraline(zoloft), tamsulosin(flomax), ranitidine(zantac), tylenol as needed, codeine                                You may not have any metal on your body including hair pins and              piercings  Do not wear jewelry, make-up, lotions, powders or perfumes, deodorant                      Men may shave face and neck.   Do not bring valuables to the hospital. [place].  Contacts, dentures or bridgework may not be worn into surgery.  Leave suitcase in the car. After surgery it may be brought to your room.                Please read over the following fact sheets you were given: _____________________________________________________________________    [facility] - Preparing for Surgery Before surgery, you can play an important role.  Because skin is not sterile, your skin needs to be as free of germs as possible.  You can reduce the number of germs on your skin by washing with CHG (chlorahexidine gluconate) soap before surgery.  CHG is an antiseptic cleaner which kills germs and bonds with the skin to continue killing germs even after washing. Please DO NOT use if you have an allergy to CHG or antibacterial soaps.  If your skin becomes reddened/irritated stop using the CHG and inform your nurse when you arrive at Short Stay. Do not shave (including legs and underarms) for at least 48 hours prior to the first CHG shower.  You may shave your  face/neck. Please follow these instructions carefully:  1.  Shower with CHG Soap the night before surgery and the  morning of Surgery.  2.  If you choose to wash your hair, wash your hair first as usual with your  normal  shampoo.  3.  After you shampoo, rinse your hair and body thoroughly to remove the  shampoo.                           4.  Use CHG as you would any other liquid soap.  You can apply chg directly  to the skin and wash                       Gently with a scrungie or clean washcloth.  5.  Apply the CHG Soap to your body ONLY FROM THE NECK DOWN.   Do not use on face/ open  Wound or open sores. Avoid contact with eyes, ears mouth and genitals (private parts).                       Wash face,  Genitals (private parts) with your normal soap.             6.  Wash thoroughly, paying special attention to the area where your surgery  will be performed.  7.  Thoroughly rinse your body with warm water from the neck down.  8.  DO NOT shower/wash with your normal soap after using and rinsing off  the CHG Soap.                9.  Pat yourself dry with a clean towel.            10.  Wear clean pajamas.            11.  Place clean sheets on your bed the night of your first shower and do not  sleep with pets. Day of Surgery : Do not apply any lotions/deodorants the morning of surgery.  Please wear clean clothes to the hospital/surgery center.  FAILURE TO FOLLOW THESE INSTRUCTIONS MAY RESULT IN THE CANCELLATION OF YOUR SURGERY PATIENT SIGNATURE_________________________________  NURSE SIGNATURE__________________________________  ________________________________________________________________________

## 2017-04-14 NOTE — Progress Notes (Signed)
LABS  epic 04-11-17 Urine culture, CBC, BMP, PTT, PTINR,  04-10-17 urinalysis   All routed to Dr Louis Meckel via epic

## 2017-04-15 ENCOUNTER — Encounter (HOSPITAL_COMMUNITY): Payer: Self-pay

## 2017-04-15 ENCOUNTER — Encounter (HOSPITAL_COMMUNITY)
Admission: RE | Admit: 2017-04-15 | Discharge: 2017-04-15 | Disposition: A | Discharge status: Home / Self Care | Payer: Medicare Other | Source: Ambulatory Visit | Attending: Urology | Admitting: Urology

## 2017-04-15 ENCOUNTER — Other Ambulatory Visit (HOSPITAL_COMMUNITY): Payer: Self-pay | Admitting: Emergency Medicine

## 2017-04-15 DIAGNOSIS — Z01818 Encounter for other preprocedural examination: Secondary | ICD-10-CM | POA: Insufficient documentation

## 2017-04-15 DIAGNOSIS — N200 Calculus of kidney: Secondary | ICD-10-CM | POA: Insufficient documentation

## 2017-04-15 NOTE — Progress Notes (Signed)
Patient with very recent blood work. RN called anesthesia and spoke with Dr Lissa Hoard to query if new lab tests needed to be done at PST appt particularly the BMP with creatinine of 1.4 on 04-13-17 . Per Dr Lissa Hoard, no new lab to be drawn today , ok to check an ISTAT CHEM 8 day of surgery to ensure nephrotomy tube not obstructed.   Also per [place] , codeine tablet oral route ok to take day of surgery .

## 2017-04-20 ENCOUNTER — Inpatient Hospital Stay (HOSPITAL_COMMUNITY): Payer: Medicare Other

## 2017-04-20 ENCOUNTER — Inpatient Hospital Stay (HOSPITAL_COMMUNITY)
Admission: RE | Admit: 2017-04-20 | Discharge: 2017-04-24 | DRG: 640 | Disposition: A | Discharge status: Home / Self Care | Payer: Medicare Other | Source: Ambulatory Visit | Attending: Urology | Admitting: Urology

## 2017-04-20 ENCOUNTER — Encounter (HOSPITAL_COMMUNITY): Payer: Self-pay

## 2017-04-20 DIAGNOSIS — Z8249 Family history of ischemic heart disease and other diseases of the circulatory system: Secondary | ICD-10-CM

## 2017-04-20 DIAGNOSIS — Z681 Body mass index (BMI) 19 or less, adult: Secondary | ICD-10-CM

## 2017-04-20 DIAGNOSIS — D638 Anemia in other chronic diseases classified elsewhere: Secondary | ICD-10-CM | POA: Diagnosis present

## 2017-04-20 DIAGNOSIS — R627 Adult failure to thrive: Secondary | ICD-10-CM | POA: Diagnosis present

## 2017-04-20 DIAGNOSIS — Z888 Allergy status to other drugs, medicaments and biological substances status: Secondary | ICD-10-CM

## 2017-04-20 DIAGNOSIS — Z932 Ileostomy status: Secondary | ICD-10-CM

## 2017-04-20 DIAGNOSIS — R1084 Generalized abdominal pain: Secondary | ICD-10-CM | POA: Diagnosis not present

## 2017-04-20 DIAGNOSIS — N1330 Unspecified hydronephrosis: Secondary | ICD-10-CM | POA: Diagnosis present

## 2017-04-20 DIAGNOSIS — K219 Gastro-esophageal reflux disease without esophagitis: Secondary | ICD-10-CM | POA: Diagnosis present

## 2017-04-20 DIAGNOSIS — D72819 Decreased white blood cell count, unspecified: Secondary | ICD-10-CM | POA: Diagnosis not present

## 2017-04-20 DIAGNOSIS — T451X5A Adverse effect of antineoplastic and immunosuppressive drugs, initial encounter: Secondary | ICD-10-CM | POA: Diagnosis present

## 2017-04-20 DIAGNOSIS — K912 Postsurgical malabsorption, not elsewhere classified: Secondary | ICD-10-CM | POA: Diagnosis present

## 2017-04-20 DIAGNOSIS — E860 Dehydration: Principal | ICD-10-CM | POA: Diagnosis present

## 2017-04-20 DIAGNOSIS — K5080 Crohn's disease of both small and large intestine without complications: Secondary | ICD-10-CM | POA: Diagnosis present

## 2017-04-20 DIAGNOSIS — K50819 Crohn's disease of both small and large intestine with unspecified complications: Secondary | ICD-10-CM | POA: Diagnosis not present

## 2017-04-20 DIAGNOSIS — E43 Unspecified severe protein-calorie malnutrition: Secondary | ICD-10-CM | POA: Diagnosis not present

## 2017-04-20 DIAGNOSIS — Z833 Family history of diabetes mellitus: Secondary | ICD-10-CM

## 2017-04-20 DIAGNOSIS — R509 Fever, unspecified: Secondary | ICD-10-CM | POA: Diagnosis not present

## 2017-04-20 DIAGNOSIS — N200 Calculus of kidney: Secondary | ICD-10-CM | POA: Diagnosis not present

## 2017-04-20 DIAGNOSIS — R112 Nausea with vomiting, unspecified: Secondary | ICD-10-CM | POA: Diagnosis not present

## 2017-04-20 DIAGNOSIS — N202 Calculus of kidney with calculus of ureter: Secondary | ICD-10-CM | POA: Diagnosis present

## 2017-04-20 DIAGNOSIS — Z880 Allergy status to penicillin: Secondary | ICD-10-CM | POA: Diagnosis not present

## 2017-04-20 DIAGNOSIS — Z808 Family history of malignant neoplasm of other organs or systems: Secondary | ICD-10-CM | POA: Diagnosis not present

## 2017-04-20 DIAGNOSIS — E2740 Unspecified adrenocortical insufficiency: Secondary | ICD-10-CM | POA: Diagnosis present

## 2017-04-20 DIAGNOSIS — N10 Acute tubulo-interstitial nephritis: Secondary | ICD-10-CM | POA: Diagnosis not present

## 2017-04-20 DIAGNOSIS — D72829 Elevated white blood cell count, unspecified: Secondary | ICD-10-CM | POA: Diagnosis not present

## 2017-04-20 DIAGNOSIS — Z79899 Other long term (current) drug therapy: Secondary | ICD-10-CM

## 2017-04-20 LAB — BASIC METABOLIC PANEL
Anion gap: 10 (ref 5–15)
BUN: 13 mg/dL (ref 6–20)
CALCIUM: 8.6 mg/dL — AB (ref 8.9–10.3)
CHLORIDE: 100 mmol/L — AB (ref 101–111)
CO2: 26 mmol/L (ref 22–32)
CREATININE: 1.03 mg/dL (ref 0.61–1.24)
GFR calc non Af Amer: >60 mL/min (ref >60)
Glucose, Bld: 111 mg/dL — ABNORMAL HIGH (ref 65–99)
Potassium: 3.9 mmol/L (ref 3.5–5.1)
Sodium: 136 mmol/L (ref 135–145)

## 2017-04-20 LAB — LACTIC ACID, PLASMA
LACTIC ACID, VENOUS: 2.2 mmol/L — AB (ref 0.5–1.9)
LACTIC ACID, VENOUS: 1.2 mmol/L (ref 0.5–1.9)

## 2017-04-20 LAB — CBC
HCT: 34.9 % — ABNORMAL LOW (ref 39.0–52.0)
HEMOGLOBIN: 11.4 g/dL — AB (ref 13.0–17.0)
MCH: 30.2 pg (ref 26.0–34.0)
MCHC: 32.7 g/dL (ref 30.0–36.0)
MCV: 92.3 fL (ref 78.0–100.0)
Platelets: 203 10*3/uL (ref 150–400)
RBC: 3.78 MIL/uL — ABNORMAL LOW (ref 4.22–5.81)
RDW: 15.9 % — AB (ref 11.5–15.5)
WBC: 1.7 10*3/uL — ABNORMAL LOW (ref 4.0–10.5)

## 2017-04-20 MED ORDER — PREDNISONE 5 MG PO TABS
5.0000 mg | ORAL_TABLET | Freq: Once | ORAL | Status: DC
  Administered 2017-04-20: 5 mg via ORAL
  Filled 2017-04-20: qty 1

## 2017-04-20 MED ORDER — TAMSULOSIN HCL 0.4 MG PO CAPS
0.4000 mg | ORAL_CAPSULE | Freq: Every day | ORAL | Status: DC
  Administered 2017-04-20 – 2017-04-24 (×5): 0.4 mg via ORAL
  Filled 2017-04-20 (×5): qty 1

## 2017-04-20 MED ORDER — FOLIC ACID 1 MG PO TABS
1.0000 mg | ORAL_TABLET | Freq: Every day | ORAL | Status: DC
  Administered 2017-04-21 – 2017-04-24 (×4): 1 mg via ORAL
  Filled 2017-04-20 (×4): qty 1

## 2017-04-20 MED ORDER — ONDANSETRON 4 MG PO TBDP
4.0000 mg | ORAL_TABLET | Freq: Three times a day (TID) | ORAL | Status: DC | PRN
  Administered 2017-04-21 – 2017-04-23 (×3): 4 mg via ORAL
  Filled 2017-04-20 (×3): qty 1

## 2017-04-20 MED ORDER — SERTRALINE HCL 50 MG PO TABS
50.0000 mg | ORAL_TABLET | Freq: Every day | ORAL | Status: DC
  Administered 2017-04-20 – 2017-04-24 (×5): 50 mg via ORAL
  Filled 2017-04-20 (×5): qty 1

## 2017-04-20 MED ORDER — HYDROCORTISONE NA SUCCINATE PF 100 MG IJ SOLR
50.0000 mg | Freq: Four times a day (QID) | INTRAMUSCULAR | Status: DC
  Administered 2017-04-20 – 2017-04-22 (×6): 50 mg via INTRAVENOUS
  Filled 2017-04-20 (×6): qty 2

## 2017-04-20 MED ORDER — FAMOTIDINE IN NACL 20-0.9 MG/50ML-% IV SOLN
20.0000 mg | Freq: Two times a day (BID) | INTRAVENOUS | Status: DC
  Administered 2017-04-20 – 2017-04-22 (×4): 20 mg via INTRAVENOUS
  Filled 2017-04-20 (×5): qty 50

## 2017-04-20 MED ORDER — VANCOMYCIN HCL IN DEXTROSE 1-5 GM/200ML-% IV SOLN
1000.0000 mg | INTRAVENOUS | Status: DC
  Administered 2017-04-20: 1000 mg via INTRAVENOUS
  Filled 2017-04-20 (×2): qty 200

## 2017-04-20 MED ORDER — LACTATED RINGERS IV SOLN
INTRAVENOUS | Status: DC
  Administered 2017-04-20 – 2017-04-24 (×7): via INTRAVENOUS

## 2017-04-20 MED ORDER — ENSURE ENLIVE PO LIQD: 237.0000 mL | Freq: Two times a day (BID) | ORAL | Status: DC

## 2017-04-20 MED ORDER — CODEINE SULFATE 30 MG PO TABS
30.0000 mg | ORAL_TABLET | Freq: Two times a day (BID) | ORAL | Status: DC
  Administered 2017-04-20 – 2017-04-24 (×8): 30 mg via ORAL
  Filled 2017-04-20 (×8): qty 1

## 2017-04-20 MED ORDER — PREDNISONE 5 MG PO TABS: 5.0000 mg | ORAL_TABLET | Freq: Every day | ORAL | Status: DC

## 2017-04-20 MED ORDER — HEPARIN SODIUM (PORCINE) 5000 UNIT/ML IJ SOLN
5000.0000 [IU] | Freq: Three times a day (TID) | INTRAMUSCULAR | Status: DC
  Administered 2017-04-20 – 2017-04-24 (×10): 5000 [IU] via SUBCUTANEOUS
  Filled 2017-04-20 (×10): qty 1

## 2017-04-20 MED ORDER — MEROPENEM 1 G IV SOLR
1.0000 g | Freq: Three times a day (TID) | INTRAVENOUS | Status: DC
  Administered 2017-04-20 – 2017-04-22 (×5): 1 g via INTRAVENOUS
  Filled 2017-04-20 (×6): qty 1

## 2017-04-20 MED ORDER — DIPHENHYDRAMINE HCL 12.5 MG/5ML PO ELIX: 12.5000 mg | ORAL_SOLUTION | Freq: Four times a day (QID) | ORAL | Status: DC | PRN

## 2017-04-20 MED ORDER — NALOXONE HCL 0.4 MG/ML IJ SOLN: 0.4000 mg | INTRAMUSCULAR | Status: DC | PRN

## 2017-04-20 MED ORDER — HYDROMORPHONE 1 MG/ML IV SOLN
INTRAVENOUS | Status: DC
  Administered 2017-04-20: 21:00:00 via INTRAVENOUS
  Administered 2017-04-21: 0.9 mg via INTRAVENOUS
  Administered 2017-04-21: 0.8 mg via INTRAVENOUS
  Administered 2017-04-21: 0.9 mg via INTRAVENOUS
  Administered 2017-04-21 – 2017-04-22 (×3): 1.5 mg via INTRAVENOUS
  Administered 2017-04-22: 0.3 mg via INTRAVENOUS
  Filled 2017-04-20: qty 25

## 2017-04-20 MED ORDER — SODIUM CHLORIDE 0.9 % IV SOLN
INTRAVENOUS | Status: DC
  Administered 2017-04-20: 21:00:00 via INTRAVENOUS

## 2017-04-20 MED ORDER — POTASSIUM CHLORIDE CRYS ER 20 MEQ PO TBCR
20.0000 meq | EXTENDED_RELEASE_TABLET | ORAL | Status: DC
  Administered 2017-04-22: 20 meq via ORAL
  Filled 2017-04-20: qty 1

## 2017-04-20 MED ORDER — DIPHENHYDRAMINE HCL 50 MG/ML IJ SOLN: 12.5000 mg | Freq: Four times a day (QID) | INTRAMUSCULAR | Status: DC | PRN

## 2017-04-20 MED ORDER — SODIUM CHLORIDE 0.9% FLUSH: 9.0000 mL | INTRAVENOUS | Status: DC | PRN

## 2017-04-20 MED ORDER — ONDANSETRON HCL 4 MG/2ML IJ SOLN
4.0000 mg | Freq: Four times a day (QID) | INTRAMUSCULAR | Status: DC | PRN
  Administered 2017-04-20 – 2017-04-21 (×3): 4 mg via INTRAVENOUS
  Filled 2017-04-20 (×3): qty 2

## 2017-04-20 MED ORDER — ONDANSETRON HCL 4 MG/2ML IJ SOLN: 4.0000 mg | Freq: Four times a day (QID) | INTRAMUSCULAR | Status: DC | PRN

## 2017-04-20 MED ORDER — CHOLECALCIFEROL 10 MCG (400 UNIT) PO TABS
400.0000 [IU] | ORAL_TABLET | Freq: Every day | ORAL | Status: DC
  Administered 2017-04-21 – 2017-04-24 (×4): 400 [IU] via ORAL
  Filled 2017-04-20 (×4): qty 1

## 2017-04-20 MED ORDER — DIPHENOXYLATE-ATROPINE 2.5-0.025 MG PO TABS
1.0000 | ORAL_TABLET | Freq: Four times a day (QID) | ORAL | Status: DC
  Administered 2017-04-20 – 2017-04-24 (×13): 1 via ORAL
  Filled 2017-04-20 (×13): qty 1

## 2017-04-20 MED ORDER — GENTAMICIN IN SALINE 1.6-0.9 MG/ML-% IV SOLN
80.0000 mg | Freq: Two times a day (BID) | INTRAVENOUS | Status: DC
  Administered 2017-04-20: 80 mg via INTRAVENOUS
  Filled 2017-04-20: qty 50

## 2017-04-20 MED ORDER — METHOTREXATE SODIUM CHEMO INJECTION 250 MG/10ML: 25.0000 mg | INTRAMUSCULAR | Status: DC

## 2017-04-20 NOTE — Progress Notes (Signed)
Pharmacy Antibiotic Note  Gregory [name] is a 56 y.o. male admitted on 04/20/2017 with bacteremia. Pt has hx of crohn's and nephrolithiasis with obstructing left UPJ stone.  Pharmacy has been consulted for vancomycin dosing.  Plan: Vancomycin 1gm IV q24h Follow renal function, cultures and clinical course vanc trough at steady state as needed Check Scr q48h  Height: 5' 11"  (180.3 cm) Weight: 108 lb 11 oz (49.3 kg) IBW/kg (Calculated) : 75.3  Temp (24hrs), Avg:99.4 F (37.4 C), Min:98.9 F (37.2 C), Max:99.8 F (37.7 C)   Recent Labs Lab 04/20/17 1851  WBC 1.7*  CREATININE 1.03    Estimated Creatinine Clearance: 55.8 mL/min (by C-G formula based on SCr of 1.03 mg/dL).    Allergies  Allergen Reactions  . Humira [Adalimumab]     Intolerance  . Penicillins   . Remicade [Infliximab]     Intolerance    Antimicrobials this admission: 8/21 vancomycin >> Dose adjustments this admission:   Microbiology results: 8/21 BCx: sent 8/21 UCx: sent   Thank you for allowing pharmacy to be a part of this patient's care.  Dolly Rias RPh 04/20/2017, 8:32 PM Pager [phone]

## 2017-04-20 NOTE — H&P (Signed)
H&P  Chief Complaint: I feel like i'm dying  History of Present Illness: Gregory [name] is a 56 y.o. year old admitted for clinic for nausea and vomiting and intermittent fevers. In clinic, the patient had a 102 fever and a heart rate of 132. The patient is complaining of some generalized pain and weakness and lack of appetite.  The patient is scheduled for removal of a large kidney stone on Thursday (2 days).  The patient has a large left-sided obstructing proximal ureteral stone.  He was admitted to the hospital approximately one week ago for which the nephrostomy tube was placed.  He was discharged home after several days in the hospital.  The patient states that while at home he never really felt like he was getting better.  All cultures from the hospital were negative, the patient was not discharged on any antibiotics.  The patient has a past medical history significant for Crohn's disease and was recently treated for a flare.  He is now at the end of his prednisone taper.  He also is taking methotrexate weekly.     Past Medical History:  Diagnosis Date  . Acute endocarditis   . Crohn's disease ([place])   . GERD (gastroesophageal reflux disease)   . Short gut syndrome     Past Surgical History:  Procedure Laterality Date  . ILEOSTOMY    . IR NEPHROSTOMY EXCHANGE LEFT  04/12/2017  . IR NEPHROSTOMY PLACEMENT LEFT  04/12/2017  . SMALL INTESTINE SURGERY    . SUBTOTAL COLECTOMY      Home Medications:    Allergies:  Allergies  Allergen Reactions  . Humira [Adalimumab]     Intolerance  . Penicillins Other (See Comments)    Has patient had a PCN reaction causing immediate rash, facial/tongue/throat swelling, SOB or lightheadedness with hypotension: Unknown Has patient had a PCN reaction causing severe rash involving mucus membranes or skin necrosis: Unknown Has patient had a PCN reaction that required hospitalization: Unknown Has patient had a PCN reaction occurring within the last  10 years: No If all of the above answers are "NO", then may proceed with Cephalosporin use.   . Remicade [Infliximab]     Intolerance    Family History  Problem Relation Age of Onset  . Heart disease Mother   . Diabetes Father   . Heart disease Father   . Melanoma Sister   . Crohn's disease Brother   . Inflammatory bowel disease Brother   . Diabetes Paternal Grandmother     Social History:  reports that he has never smoked. He has never used smokeless tobacco. He reports that he does not drink alcohol or use drugs.  ROS: A complete review of systems was performed.  All systems are negative except for pertinent findings as noted.  Physical Exam:  Vital signs in last 24 hours: Temp:  [98.9 F (37.2 C)-99.8 F (37.7 C)] 99.8 F (37.7 C) (08/21 2008) Pulse Rate:  [89-90] 90 (08/21 2008) Resp:  [12-15] 12 (08/21 2042) BP: (91-114)/(63-78) 91/63 (08/21 2008) SpO2:  [95 %-98 %] 97 % (08/21 2042) Weight:  [49.3 kg (108 lb 11 oz)] 49.3 kg (108 lb 11 oz) (08/21 1801) Constitutional:  Alert and oriented, very thin, weak Cardiovascular: Regular rate and rhythm, No JVD Respiratory: Normal respiratory effort, Lungs clear bilaterally GI: Abdomen is soft, nontender, nondistended, no abdominal masses GU: mild left flank pain, nephrostomy tube draining straw colored urine. Neurologic: Grossly intact, no focal deficits Psychiatric: Normal mood and affect  Laboratory Data:   Recent Labs  04/20/17 1851  WBC 1.7*  HGB 11.4*  HCT 34.9*    Recent Labs  04/20/17 1851  NA 136  K 3.9  CL 100*  CO2 26  GLUCOSE 111*  BUN 13  CREATININE 1.03  CALCIUM 8.6*   No results for input(s): LABPT, INR in the last 72 hours. No results for input(s): LABURIN in the last 72 hours. Results for orders placed or performed during the hospital encounter of 04/10/17  Culture, Urine     Status: None   Collection Time: 04/11/17  3:10 PM  Result Value Ref Range Status   Specimen Description  URINE, CLEAN CATCH  Final   Special Requests NONE  Final   Culture   Final    NO GROWTH Performed at [facility], [place] [address], [place], [place] [address]    Report Status 04/12/2017 FINAL  Final     Radiologic Imaging: No results found.  Impression/Assessment:  The patient is immunocompromised and likely has either pyelonephritis or urosepsis and associated dehydration.fortunately, with IV fluids his lactate has cleared.  Concerning is his white blood cell count is 1.7K, which may be from the methotrexate or bacteremia.  Plan:  Neuro - PCA for pain, APAP PRN CV - Lactated ringers at 125cc, stress dose steroids - hydrocortisone 61m IV q6 with quick taper (1/2 daily).   On telemetry GI - Holding methotrexate and oral predisone for the time being, resume other crohn's meds/vitamins.  Regular diet as tolerated with nutritional supplements.  Zofran PRN.  KUB pending. ID - meropenem 116m Q8 (started on 8/21).  Blood and urine cultures are pending.  Narrow when able.   Prop- famotidine, SCDs, heparin, encourage ambulation   Patient is scheduled for surgery on Thursday.  At this point, this is likely to be postponed.  He is likely to be in the hospital for 2-4 days.   04/20/2017, 10:58 PM  BeArdis Hughs MD

## 2017-04-20 NOTE — Progress Notes (Addendum)
CRITICAL VALUE ALERT  Critical Value:  Lactic Acid 2.2  Date & Time Notied:  04/20/17 @ 2045  Provider Notified: Louis Meckel, Urology  Orders Received/Actions taken:

## 2017-04-21 LAB — BASIC METABOLIC PANEL
ANION GAP: 8 (ref 5–15)
BUN: 14 mg/dL (ref 6–20)
CHLORIDE: 102 mmol/L (ref 101–111)
CO2: 25 mmol/L (ref 22–32)
Calcium: 7.8 mg/dL — ABNORMAL LOW (ref 8.9–10.3)
Creatinine, Ser: 0.87 mg/dL (ref 0.61–1.24)
GFR calc Af Amer: >60 mL/min (ref >60)
GFR calc non Af Amer: >60 mL/min (ref >60)
GLUCOSE: 125 mg/dL — AB (ref 65–99)
POTASSIUM: 3.7 mmol/L (ref 3.5–5.1)
Sodium: 135 mmol/L (ref 135–145)

## 2017-04-21 LAB — CBC
HEMATOCRIT: 27 % — AB (ref 39.0–52.0)
Hemoglobin: 9 g/dL — ABNORMAL LOW (ref 13.0–17.0)
MCH: 30.4 pg (ref 26.0–34.0)
MCHC: 33.3 g/dL (ref 30.0–36.0)
MCV: 91.2 fL (ref 78.0–100.0)
Platelets: 165 10*3/uL (ref 150–400)
RBC: 2.96 MIL/uL — AB (ref 4.22–5.81)
RDW: 15.7 % — ABNORMAL HIGH (ref 11.5–15.5)
WBC: 1.6 10*3/uL — AB (ref 4.0–10.5)

## 2017-04-21 LAB — LACTIC ACID, PLASMA: Lactic Acid, Venous: 0.7 mmol/L (ref 0.5–1.9)

## 2017-04-21 MED ORDER — UNJURY CHICKEN SOUP POWDER
2.0000 [oz_av] | Freq: Three times a day (TID) | ORAL | Status: DC
  Administered 2017-04-21 – 2017-04-22 (×3): 2 [oz_av] via ORAL
  Filled 2017-04-21 (×10): qty 27

## 2017-04-21 MED ORDER — PRO-STAT SUGAR FREE PO LIQD: 30.0000 mL | Freq: Two times a day (BID) | ORAL | Status: DC

## 2017-04-21 MED ORDER — BOOST / RESOURCE BREEZE PO LIQD
1.0000 | Freq: Three times a day (TID) | ORAL | Status: DC
  Administered 2017-04-21 – 2017-04-23 (×6): 1 via ORAL

## 2017-04-21 NOTE — Progress Notes (Signed)
Urology Inpatient Progress Report  LEFT KIDNEY STONE Acute Pylonephritis  Admitted from clinic for dehydration/pyelonephritis  Intv/Subj: Feels better this AM, still having nausea but tolerated breakfast Placed on stress dose steriods, meropenem No acute events   Active Problems:   Nephrolithiasis  Current Facility-Administered Medications  Medication Dose Route Frequency Provider Last Rate Last Dose  . cholecalciferol (VITAMIN D) tablet 400 Units  400 Units Oral Daily [name], MD   400 Units at 04/21/17 0900  . codeine tablet 30 mg  30 mg Oral BID [name], MD   30 mg at 04/21/17 0900  . diphenhydrAMINE (BENADRYL) injection 12.5 mg  12.5 mg Intravenous Q6H PRN [name], MD       Or  . diphenhydrAMINE (BENADRYL) 12.5 MG/5ML elixir 12.5 mg  12.5 mg Oral Q6H PRN [name], MD      . diphenoxylate-atropine (LOMOTIL) 2.5-0.025 MG per tablet 1 tablet  1 tablet Oral QID [name], MD   1 tablet at 04/21/17 0900  . famotidine (PEPCID) IVPB 20 mg premix  20 mg Intravenous Q12H [name], MD 100 mL/hr at 04/21/17 0900 20 mg at 04/21/17 0900  . feeding supplement (ENSURE ENLIVE) (ENSURE ENLIVE) liquid 237 mL  237 mL Oral BID BM [name], MD      . folic acid (FOLVITE) tablet 1 mg  1 mg Oral Daily [name], MD   1 mg at 04/21/17 0900  . heparin injection 5,000 Units  5,000 Units Subcutaneous Q8H [name], MD   5,000 Units at 04/21/17 0557  . hydrocortisone sodium succinate (SOLU-CORTEF) 100 MG injection 50 mg  50 mg Intravenous Q6H [name], MD   50 mg at 04/21/17 0557  . HYDROmorphone (DILAUDID) 1 mg/mL PCA injection   Intravenous Q4H [name], MD      . lactated ringers infusion   Intravenous Continuous [name], MD 125 mL/hr at 04/21/17 0901    . meropenem (MERREM) 1 g in sodium chloride 0.9 % 100 mL IVPB  1 g Intravenous Q8H [name], MD   Stopped at  04/21/17 9323  . naloxone [facility]) injection 0.4 mg  0.4 mg Intravenous PRN [name], MD       And  . sodium chloride flush (NS) 0.9 % injection 9 mL  9 mL Intravenous PRN [name], MD      . ondansetron [facility]) injection 4 mg  4 mg Intravenous Q6H PRN [name], MD   4 mg at 04/21/17 0427  . ondansetron (ZOFRAN-ODT) disintegrating tablet 4 mg  4 mg Oral Q8H PRN [name], MD      . Derrill Memo ON 04/22/2017] potassium chloride SA (K-DUR,KLOR-CON) CR tablet 20 mEq  20 mEq Oral Once per day on Mon Thu Herrick, Benjamin W, MD      . sertraline (ZOLOFT) tablet 50 mg  50 mg Oral Daily [name], MD   50 mg at 04/21/17 0900  . tamsulosin (FLOMAX) capsule 0.4 mg  0.4 mg Oral Daily [name], MD   0.4 mg at 04/21/17 0900     Objective: Vital: Vitals:   04/21/17 0000 04/21/17 0400 04/21/17 0458 04/21/17 0800  BP:   105/72   Pulse:   73   Resp: 14 13  11   Temp:   98 F (36.7 C)   TempSrc:   Oral   SpO2: 95% 95% 96% 96%  Weight:  Height:       I/Os: I/O last 3 completed shifts: In: 1102.1 [I.V.:852.1; IV Piggyback:250] Out: -   Physical Exam:  General: Patient is in no apparent distress Lungs: Normal respiratory effort, chest expands symmetrically. GI: The abdomen is soft and nontender without mass. Neph tube draining straw colored urine Ext: lower extremities symmetric  Lab Results:  Recent Labs  04/20/17 1851 04/21/17 0618  WBC 1.7* 1.6*  HGB 11.4* 9.0*  HCT 34.9* 27.0*    Recent Labs  04/20/17 1851 04/21/17 0618  NA 136 135  K 3.9 3.7  CL 100* 102  CO2 26 25  GLUCOSE 111* 125*  BUN 13 14  CREATININE 1.03 0.87  CALCIUM 8.6* 7.8*   No results for input(s): LABPT, INR in the last 72 hours. No results for input(s): LABURIN in the last 72 hours. Results for orders placed or performed during the hospital encounter of 04/10/17  Culture, Urine     Status: None   Collection Time: 04/11/17  3:10 PM  Result  Value Ref Range Status   Specimen Description URINE, CLEAN CATCH  Final   Special Requests NONE  Final   Culture   Final    NO GROWTH Performed at [facility], [place] [address], [place], [place] [address]    Report Status 04/12/2017 FINAL  Final    Studies/Results: Dg Abd 1 View  Result Date: 04/21/2017 CLINICAL DATA:  Sudden onset of nausea and vomiting EXAM: ABDOMEN - 1 VIEW COMPARISON:  Fluoroscopic images from percutaneous nephrostomy exchange. FINDINGS: The bowel gas pattern is normal. No obstruction or free air is identified within the abdomen pelvis. Chain sutures are noted in the left lower quadrant and mid-pelvis. A percutaneous nephrostomy tube projects over the left renal shadow. IMPRESSION: Negative for bowel obstruction or free air. Left-sided nephrostomy tube in place. Electronically Signed   By: Ashley Royalty M.D.   On: 04/21/2017 00:02    Assessment: Left pyelonephritis Dehydration Adrenal insufficiency Anemia of chronic disease Leukopenia - likely from methotrexate  Plan: On meropenem, day 2 - cultures pending Hydrocortisone 8m IV q6, taper starting tomorrow Holding methotrexate, will consult GI tomorrow for recommendations for crohn's management IV Fluids - trend UOP Regular diet with supplemental protein shakes PCA for pain PRN  Will postpone surgery scheduled for tomorrow - reschedule in a few weeks to allow appropriate recovery Patient likely to be in the hospital a few additional days while we wean his steroids and wait for his cultures to return.  BLouis Meckel MD Urology 04/21/2017, 9:36 AM

## 2017-04-21 NOTE — Progress Notes (Addendum)
Initial Nutrition Assessment  DOCUMENTATION CODES:   Underweight, Severe malnutrition in context of chronic illness  INTERVENTION:   Boost Breeze po TID, each supplement provides 250 kcal and 9 grams of protein   Unjury chicken Soup TID, Each serving provides 100kcal and 21g protein   Daily weights   NUTRITION DIAGNOSIS:   Malnutrition (Severe) related to chronic illness (Crohn's Disease, Short Gut) as evidenced by energy intake < or equal to 50% for > or equal to 5 days, severe depletion of body fat, severe depletion of muscle mass, 5.2 % weight loss.in two weeks.   GOAL:   Patient will meet greater than or equal to 90% of their needs  MONITOR:   PO intake, Supplement acceptance, Labs, Weight trends  REASON FOR ASSESSMENT:   Malnutrition Screening Tool    ASSESSMENT:   Pt with PMH of Chron's, short gut syndrome, and nephrolithiasis. Admitted to hospital one week ago for a nephrostomy tube placement. Presents this admission with lack of appetite, possible pyelonephritis or urosepsis given 102 fever. Pt is scheduled for removal of large kidney stone in 2 days per urology note.   Spoke with pt right before discharge last admission, unfortunately pt left before RD could write note. Pt reports appetite picked up post discharge last week  for two days, then decreased significantly. States he has worsening nausea and vomiting upon eating, only consuming bites of meals. Pt has hard time tolerating different foods. Typical things he can tolerate are soups, sandwiches and peanut butter. Discussed the importance of protein intake given his severely malnourished state. Provided information on different protein supplements he can try to preserve lean body mass and hopefully gain wt. Pt cannot tolerate Ensure products that are "milky". Will provide different clear options this admission for pt to try.   Records indicate pt has lost 5.2% in body wt over the last week and a half. This is  significant. Pt having hard time maintaining UBW of 120 lb.   Nutrition-Focused physical exam completed. Findings are severe fat depletion, severe muscle depletion, and no edema. This is an unfortunate situation as pt trying very hard to gain wt. Will continuously monitor PO intake and supplement toleration.   Medications reviewed and include: Vit D, folic acid, KCl, LR @ 852 ml/hr Labs reviewed: CBG 94-125 Calcium 7.8 (L)  Diet Order:  Diet regular Room service appropriate? Yes; Fluid consistency: Thin  Skin:  Reviewed, no issues  Last BM:  04/21/17  Height:   Ht Readings from Last 1 Encounters:  04/20/17 5' 11"  (1.803 m)    Weight:   Wt Readings from Last 1 Encounters:  04/20/17 108 lb 11 oz (49.3 kg)    Ideal Body Weight:  78.2 kg  BMI:  Body mass index is 15.16 kg/m.  Estimated Nutritional Needs:   Kcal:  1800-2000 (37-40 kcal/kg)  Protein:  95-105 grams (2-2.1 g/kg)  Fluid:  >1.8 L/day  EDUCATION NEEDS:   Education needs addressed  [place], LDN Clinical Nutrition Pager # [phone]

## 2017-04-22 DIAGNOSIS — K5080 Crohn's disease of both small and large intestine without complications: Secondary | ICD-10-CM

## 2017-04-22 DIAGNOSIS — K50819 Crohn's disease of both small and large intestine with unspecified complications: Secondary | ICD-10-CM

## 2017-04-22 DIAGNOSIS — E43 Unspecified severe protein-calorie malnutrition: Secondary | ICD-10-CM

## 2017-04-22 DIAGNOSIS — R112 Nausea with vomiting, unspecified: Secondary | ICD-10-CM

## 2017-04-22 DIAGNOSIS — D72819 Decreased white blood cell count, unspecified: Secondary | ICD-10-CM

## 2017-04-22 LAB — BASIC METABOLIC PANEL
ANION GAP: 6 (ref 5–15)
BUN: 12 mg/dL (ref 6–20)
CALCIUM: 8.2 mg/dL — AB (ref 8.9–10.3)
CHLORIDE: 104 mmol/L (ref 101–111)
CO2: 28 mmol/L (ref 22–32)
CREATININE: 0.84 mg/dL (ref 0.61–1.24)
GFR calc Af Amer: >60 mL/min (ref >60)
GFR calc non Af Amer: >60 mL/min (ref >60)
GLUCOSE: 102 mg/dL — AB (ref 65–99)
Potassium: 3.4 mmol/L — ABNORMAL LOW (ref 3.5–5.1)
Sodium: 138 mmol/L (ref 135–145)

## 2017-04-22 LAB — CBC
HEMATOCRIT: 28 % — AB (ref 39.0–52.0)
Hemoglobin: 9.2 g/dL — ABNORMAL LOW (ref 13.0–17.0)
MCH: 29.7 pg (ref 26.0–34.0)
MCHC: 32.9 g/dL (ref 30.0–36.0)
MCV: 90.3 fL (ref 78.0–100.0)
Platelets: 164 10*3/uL (ref 150–400)
RBC: 3.1 MIL/uL — ABNORMAL LOW (ref 4.22–5.81)
RDW: 15.5 % (ref 11.5–15.5)
WBC: 2.3 10*3/uL — ABNORMAL LOW (ref 4.0–10.5)

## 2017-04-22 LAB — URINE CULTURE: Culture: NO GROWTH

## 2017-04-22 MED ORDER — ACETAMINOPHEN 500 MG PO TABS: 1000.0000 mg | ORAL_TABLET | Freq: Four times a day (QID) | ORAL | Status: DC | PRN

## 2017-04-22 MED ORDER — LIDOCAINE-EPINEPHRINE (PF) 1 %-1:200000 IJ SOLN
INTRAMUSCULAR | Status: AC
  Filled 2017-04-22: qty 30

## 2017-04-22 MED ORDER — ONDANSETRON HCL 4 MG/2ML IJ SOLN
4.0000 mg | Freq: Two times a day (BID) | INTRAMUSCULAR | Status: DC
  Administered 2017-04-22 – 2017-04-23 (×4): 4 mg via INTRAVENOUS
  Filled 2017-04-22 (×4): qty 2

## 2017-04-22 MED ORDER — IOPAMIDOL (ISOVUE-300) INJECTION 61%
INTRAVENOUS | Status: AC
  Filled 2017-04-22: qty 50

## 2017-04-22 MED ORDER — POTASSIUM CHLORIDE 10 MEQ/100ML IV SOLN
10.0000 meq | INTRAVENOUS | Status: AC
  Administered 2017-04-22 (×4): 10 meq via INTRAVENOUS
  Filled 2017-04-22 (×4): qty 100

## 2017-04-22 MED ORDER — OXYCODONE HCL 5 MG PO TABS
5.0000 mg | ORAL_TABLET | ORAL | Status: DC | PRN
  Administered 2017-04-22 – 2017-04-23 (×5): 10 mg via ORAL
  Administered 2017-04-23: 5 mg via ORAL
  Administered 2017-04-24 (×3): 10 mg via ORAL
  Filled 2017-04-22: qty 2
  Filled 2017-04-22: qty 1
  Filled 2017-04-22 (×3): qty 2
  Filled 2017-04-22: qty 1
  Filled 2017-04-22 (×5): qty 2

## 2017-04-22 MED ORDER — FAMOTIDINE 20 MG PO TABS: 20.0000 mg | ORAL_TABLET | Freq: Two times a day (BID) | ORAL | Status: DC

## 2017-04-22 MED ORDER — HYDROCORTISONE NA SUCCINATE PF 100 MG IJ SOLR: 50.0000 mg | Freq: Two times a day (BID) | INTRAMUSCULAR | Status: DC

## 2017-04-22 MED ORDER — METHYLPREDNISOLONE SODIUM SUCC 125 MG IJ SOLR: 60.0000 mg | Freq: Every day | INTRAMUSCULAR | Status: DC

## 2017-04-22 MED ORDER — CIPROFLOXACIN HCL 500 MG PO TABS
500.0000 mg | ORAL_TABLET | Freq: Two times a day (BID) | ORAL | Status: DC
  Administered 2017-04-22 – 2017-04-24 (×5): 500 mg via ORAL
  Filled 2017-04-22 (×5): qty 1

## 2017-04-22 MED ORDER — BUPIVACAINE HCL (PF) 0.5 % IJ SOLN
INTRAMUSCULAR | Status: AC
  Filled 2017-04-22: qty 30

## 2017-04-22 MED ORDER — POTASSIUM CHLORIDE CRYS ER 20 MEQ PO TBCR
20.0000 meq | EXTENDED_RELEASE_TABLET | Freq: Two times a day (BID) | ORAL | Status: AC
  Administered 2017-04-22: 20 meq via ORAL
  Filled 2017-04-22: qty 1

## 2017-04-22 MED ORDER — PANTOPRAZOLE SODIUM 40 MG IV SOLR
40.0000 mg | INTRAVENOUS | Status: DC
  Administered 2017-04-22 – 2017-04-23 (×2): 40 mg via INTRAVENOUS
  Filled 2017-04-22 (×2): qty 40

## 2017-04-22 NOTE — Progress Notes (Signed)
PHARMACIST - PHYSICIAN COMMUNICATION  DR:  Louis Meckel CONCERNING: IV to Oral Route Change Policy  RECOMMENDATION: This patient is receiving famotidine by the intravenous route.  Based on criteria approved by the Pharmacy and Therapeutics Committee, the intravenous medication(s) is/are being converted to the equivalent oral dose form(s).   DESCRIPTION: These criteria include:  The patient is eating (either orally or via tube) and/or has been taking other orally administered medications for a least 24 hours  The patient has no evidence of active gastrointestinal bleeding or impaired GI absorption (gastrectomy, short bowel, patient on TNA or NPO).  If you have questions about this conversion, please contact the Pharmacy Department  []   [phone] )  [facility] []   [phone] )  [facility] []   [phone] )  [facility] []   [phone] )  [facility] [x]   [phone] )  [place], [place], [facility] 04/22/2017 12:58 PM

## 2017-04-22 NOTE — Progress Notes (Signed)
Urology Inpatient Progress Report  LEFT KIDNEY STONE Acute Pylonephritis  Admitted from clinic for dehydration/pyelonephritis  Intv/Subj: Continues to improve symptomatically Having some nausea and not really eating well Complaining of pain in his left kidney but significantly better  Urine/blood cultures negative Discussed WBC with Shadad, oncology, feels its related to methotrexate   Active Problems:   Nephrolithiasis  Current Facility-Administered Medications  Medication Dose Route Frequency Provider Last Rate Last Dose  . acetaminophen (TYLENOL) tablet 1,000 mg  1,000 mg Oral Q6H PRN [name], MD      . cholecalciferol (VITAMIN D) tablet 400 Units  400 Units Oral Daily [name], MD   400 Units at 04/21/17 0900  . ciprofloxacin (CIPRO) tablet 500 mg  500 mg Oral BID [name], MD      . codeine tablet 30 mg  30 mg Oral BID [name], MD   30 mg at 04/21/17 2228  . diphenoxylate-atropine (LOMOTIL) 2.5-0.025 MG per tablet 1 tablet  1 tablet Oral QID [name], MD   1 tablet at 04/21/17 2228  . famotidine (PEPCID) IVPB 20 mg premix  20 mg Intravenous Q12H [name], MD   Stopped at 04/21/17 2257  . feeding supplement (BOOST / RESOURCE BREEZE) liquid 1 Container  1 Container Oral TID BM [name], MD   1 Container at 04/21/17 2008  . folic acid (FOLVITE) tablet 1 mg  1 mg Oral Daily [name], MD   1 mg at 04/21/17 0900  . heparin injection 5,000 Units  5,000 Units Subcutaneous Q8H [name], MD   5,000 Units at 04/22/17 0515  . hydrocortisone sodium succinate (SOLU-CORTEF) 100 MG injection 50 mg  50 mg Intravenous Q12H [name], MD      . lactated ringers infusion   Intravenous Continuous [name], MD 125 mL/hr at 04/21/17 1714    . ondansetron (ZOFRAN-ODT) disintegrating tablet 4 mg  4 mg Oral Q8H PRN [name], MD   4 mg at 04/21/17 1047  . oxyCODONE (Oxy  IR/ROXICODONE) immediate release tablet 5-10 mg  5-10 mg Oral Q4H PRN [name], MD      . potassium chloride SA (K-DUR,KLOR-CON) CR tablet 20 mEq  20 mEq Oral Once per day on Mon Thu Herrick, Benjamin W, MD      . protein supplement [facility] CHICKEN SOUP) powder 2 oz  2 oz Oral TID [name], MD   2 oz at 04/21/17 2227  . sertraline (ZOLOFT) tablet 50 mg  50 mg Oral Daily [name], MD   50 mg at 04/21/17 0900  . tamsulosin (FLOMAX) capsule 0.4 mg  0.4 mg Oral Daily [name], MD   0.4 mg at 04/21/17 0900     Objective: Vital: Vitals:   04/22/17 0040 04/22/17 0130 04/22/17 0400 04/22/17 0426  BP: 106/70   (!) 106/58  Pulse: 78   61  Resp:  (!) 8 10 20   Temp: 98.2 F (36.8 C)   98.7 F (37.1 C)  TempSrc: Oral   Oral  SpO2:  96%  96%  Weight:      Height:       I/Os: I/O last 3 completed shifts: In: 4742.1 [P.O.:240; I.V.:3852.1; IV Piggyback:650] Out: 980 [Urine:980]  Physical Exam:  General: Patient is in no apparent distress Lungs: Normal respiratory effort, chest expands symmetrically. GI: The abdomen is soft and nontender without mass. Neph tube draining straw  colored urine Ext: lower extremities symmetric  Lab Results:  Recent Labs  04/20/17 1851 04/21/17 0618  WBC 1.7* 1.6*  HGB 11.4* 9.0*  HCT 34.9* 27.0*    Recent Labs  04/20/17 1851 04/21/17 0618  NA 136 135  K 3.9 3.7  CL 100* 102  CO2 26 25  GLUCOSE 111* 125*  BUN 13 14  CREATININE 1.03 0.87  CALCIUM 8.6* 7.8*   No results for input(s): LABPT, INR in the last 72 hours. No results for input(s): LABURIN in the last 72 hours. Results for orders placed or performed during the hospital encounter of 04/20/17  Culture, blood (Routine X 2) w Reflex to ID Panel     Status: None (Preliminary result)   Collection Time: 04/20/17  6:44 PM  Result Value Ref Range Status   Specimen Description BLOOD RIGHT ANTECUBITAL  Final   Special Requests IN PEDIATRIC BOTTLE  Blood Culture adequate volume  Final   Culture   Final    NO GROWTH < 12 HOURS Performed at [facility], [place] [address] [place], [place] [address]    Report Status PENDING  Incomplete  Culture, blood (Routine X 2) w Reflex to ID Panel     Status: None (Preliminary result)   Collection Time: 04/20/17  6:44 PM  Result Value Ref Range Status   Specimen Description BLOOD LEFT ARM  Final   Special Requests IN PEDIATRIC BOTTLE Blood Culture adequate volume  Final   Culture   Final    NO GROWTH < 12 HOURS Performed at [facility], [place] [address] [place], [place] [address]    Report Status PENDING  Incomplete  Culture, Urine     Status: None   Collection Time: 04/20/17  8:14 PM  Result Value Ref Range Status   Specimen Description URINE, CLEAN CATCH  Final   Special Requests from nephrostomy tube Immunocompromised  Final   Culture   Final    NO GROWTH Performed at [facility], [address], [place], [place] [address]    Report Status 04/22/2017 FINAL  Final    Studies/Results: Dg Abd 1 View  Result Date: 04/21/2017 CLINICAL DATA:  Sudden onset of nausea and vomiting EXAM: ABDOMEN - 1 VIEW COMPARISON:  Fluoroscopic images from percutaneous nephrostomy exchange. FINDINGS: The bowel gas pattern is normal. No obstruction or free air is identified within the abdomen pelvis. Chain sutures are noted in the left lower quadrant and mid-pelvis. A percutaneous nephrostomy tube projects over the left renal shadow. IMPRESSION: Negative for bowel obstruction or free air. Left-sided nephrostomy tube in place. Electronically Signed   By: Ashley Royalty M.D.   On: 04/21/2017 00:02    Assessment: Left flank pain - likely from neph tube, cultures negative Dehydration Adrenal insufficiency Anemia of chronic disease Leukopenia - likely from methotrexate  Plan: Transition from meropenem to cipro BID Hydrocortisone 65m IV q12, daily tomorrow, resume prednisone once off  hydrocortisone Holding methotrexate, GI consult for recommendations for crohn's management IV Fluids - trend UOP Regular diet with supplemental protein shakes PCA for pain PRN  Rescheduled surgery for stone removal to Sept 14th Patient likely to be in the hospital a few additional days while we wean his steroids and wait for his cultures to return.  BLouis Meckel MD Urology 04/22/2017, 9:15 AM

## 2017-04-22 NOTE — Consult Note (Signed)
Referring Provider:  Dr. Louis Meckel, urology Primary Care Physician:  Patient, No Pcp Per Primary Gastroenterologist:  Dr. Havery Moros  Reason for Consultation:  Crohn's management  HPI: Gregory [name] is a 56 y.o. 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  Most recent plan was for combination methotrexate and Entyvio with eventual discontinuation of prednisone.  Has been on methotrexate for about one month.  Gregory [name] has been approved and he is actually scheduled to get it tomorrow at [facility]. Also has left kidney stone with obstruction causing recent hydronephrosis and pyelonephritis with left nephrotomy tube in place.  Plan is for removal of stone, which was supposed to occur today, but has been postponed to September.    He is here now with complaints of abdominal pain, nausea, vomiting, inability to eat/failure to thrive, and fever.  Cultures negative, but is on antibiotics per urology.  Tells me that he cannot eat and can barely drink anything due to abdominal pain, mostly in upper abdomen.  A lot of nausea and vomiting.  Diarrhea about 2 times a day recently because he has not been eating but when he was eating he was having about 10 BM's per day.  Feels like he gets very sick after he takes him methotrexate; last dose was 8/17.  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 Acute endocarditis   . Crohn's disease ([place])   . GERD (gastroesophageal reflux disease)   . Short gut syndrome     Past Surgical History:  Procedure Laterality Date  . ILEOSTOMY    . IR NEPHROSTOMY EXCHANGE LEFT  04/12/2017  . IR NEPHROSTOMY PLACEMENT LEFT  04/12/2017  . SMALL INTESTINE SURGERY    . SUBTOTAL COLECTOMY      Prior to Admission medications   Medication  Sig Start Date End Date Taking? Authorizing Provider  acetaminophen (TYLENOL) 500 MG tablet Take 2 tablets (1,000 mg total) by mouth every 6 (six) hours. 04/13/17  Yes Filippou, Braxton Feathers, MD  cholecalciferol (VITAMIN D) 400 units TABS tablet Take 400 Units by mouth daily.   Yes [provider]  codeine 30 MG tablet Take 1 tablet (30 mg total) by mouth 2 (two) times daily. 02/23/17  Yes Armbruster, Renelda Loma, MD  diphenoxylate-atropine (LOMOTIL) 2.5-0.025 MG tablet Take 1 tablet by mouth 4 (four) times daily. 02/23/17  Yes Armbruster, Renelda Loma, MD  folic acid (FOLVITE) 1 MG tablet Take 1 tablet (1 mg total) by mouth daily. 03/01/17  Yes Armbruster, Renelda Loma, MD  methotrexate 250 MG/10ML injection Inject 25 mg into the skin once a week.  03/10/17  Yes [provider]  ondansetron (ZOFRAN-ODT) 4 MG disintegrating tablet Take 1 tablet (4 mg total) by mouth every 8 (eight) hours as needed for nausea or vomiting. 04/13/17  Yes Filippou, Braxton Feathers, MD  potassium chloride SA (K-DUR,KLOR-CON) 20 MEQ tablet Take 1 tablet (20 mEq total) by mouth as directed. Patient taking differently: Take 20 mEq by mouth as directed. Take 53mq Mondays and Thursdays 04/02/17  Yes Armbruster, SRenelda Loma MD  predniSONE (DELTASONE) 10 MG tablet Take 20 mg by mouth for 1 week, 15 mg daily for 1 week, 10 mg daily for 1 week, then 5 mg a day Patient taking differently: Take 5 mg by mouth daily. Take 20 mg by mouth for  1 week, 15 mg daily for 1 week, 10 mg daily for 1 week, then 5 mg a day 03/09/17  Yes Armbruster, Renelda Loma, MD  ranitidine (ZANTAC) 150 MG tablet Take 1 tablet (150 mg total) by mouth 2 (two) times daily. 03/09/17  Yes Armbruster, Renelda Loma, MD  sertraline (ZOLOFT) 50 MG tablet Take 50 mg by mouth daily.   Yes [provider]  tamsulosin (FLOMAX) 0.4 MG CAPS capsule Take 0.4 mg by mouth daily. 04/05/17  Yes [provider]  traMADol (ULTRAM) 50 MG tablet Take 50-150 mg by mouth  every 6 (six) hours as needed for moderate pain.  04/09/17  Yes [provider]    Current Facility-Administered Medications  Medication Dose Route Frequency Provider Last Rate Last Dose  . acetaminophen (TYLENOL) tablet 1,000 mg  1,000 mg Oral Q6H PRN [name], MD      . cholecalciferol (VITAMIN D) tablet 400 Units  400 Units Oral Daily [name], MD   400 Units at 04/22/17 [phone]  . ciprofloxacin (CIPRO) tablet 500 mg  500 mg Oral BID [name], MD   500 mg at 04/22/17 1007  . codeine tablet 30 mg  30 mg Oral BID [name], MD   30 mg at 04/22/17 8502  . diphenoxylate-atropine (LOMOTIL) 2.5-0.025 MG per tablet 1 tablet  1 tablet Oral QID [name], MD   1 tablet at 04/22/17 [phone]  . famotidine (PEPCID) IVPB 20 mg premix  20 mg Intravenous Q12H [name], MD 100 mL/hr at 04/22/17 0959 20 mg at 04/22/17 0959  . feeding supplement (BOOST / RESOURCE BREEZE) liquid 1 Container  1 Container Oral TID BM [name], MD   1 Container at 04/22/17 1007  . folic acid (FOLVITE) tablet 1 mg  1 mg Oral Daily [name], MD   1 mg at 04/22/17 0959  . heparin injection 5,000 Units  5,000 Units Subcutaneous Q8H [name], MD   5,000 Units at 04/22/17 0515  . hydrocortisone sodium succinate (SOLU-CORTEF) 100 MG injection 50 mg  50 mg Intravenous Q12H [name], MD      . lactated ringers infusion   Intravenous Continuous [name], MD 125 mL/hr at 04/21/17 1714    . ondansetron (ZOFRAN-ODT) disintegrating tablet 4 mg  4 mg Oral Q8H PRN [name], MD   4 mg at 04/21/17 1047  . oxyCODONE (Oxy IR/ROXICODONE) immediate release tablet 5-10 mg  5-10 mg Oral Q4H PRN [name], MD      . potassium chloride SA (K-DUR,KLOR-CON) CR tablet 20 mEq  20 mEq Oral Once per day on Mon Thu Herrick, Benjamin W, MD   20 mEq at 04/22/17 0959  . protein supplement (UNJURY CHICKEN SOUP) powder 2 oz  2 oz  Oral TID [name], MD   2 oz at 04/22/17 0959  . sertraline (ZOLOFT) tablet 50 mg  50 mg Oral Daily [name], MD   50 mg at 04/22/17 0959  . tamsulosin (FLOMAX) capsule 0.4 mg  0.4 mg Oral Daily [name], MD   0.4 mg at 04/22/17 2878    Allergies as of 03/29/2017 - Review Complete 02/23/2017  Allergen Reaction Noted  . Humira [adalimumab]  02/23/2017  . Penicillins  02/23/2017  . Remicade [infliximab]  02/23/2017    Family History  Problem Relation Age of Onset  . Heart disease Mother   . Diabetes Father   .  Heart disease Father   . Melanoma Sister   . Crohn's disease Brother   . Inflammatory bowel disease Brother   . Diabetes Paternal Grandmother     Social History   Social History  . Marital status: Divorced    Spouse name: N/A  . Number of children: N/A  . Years of education: N/A   Occupational History  . Retired    Social History Main Topics  . Smoking status: Never Smoker  . Smokeless tobacco: Never Used  . Alcohol use No  . Drug use: No  . Sexual activity: Not on file   Other Topics Concern  . Not on file   Social History Narrative  . No narrative on file    Review of Systems: ROS is O/W negative except as mentioned in HPI.  Physical Exam: Vital signs in last 24 hours: Temp:  [98.1 F (36.7 C)-98.7 F (37.1 C)] 98.7 F (37.1 C) (08/23 0426) Pulse Rate:  [61-97] 61 (08/23 0426) Resp:  [8-20] 20 (08/23 0426) BP: (106-122)/(58-78) 106/58 (08/23 0426) SpO2:  [95 %-99 %] 96 % (08/23 0426) Last BM Date: 04/21/17 General:  Alert, cachectic, pleasant and cooperative in NAD Head:  Normocephalic and atraumatic. Eyes:  Sclera clear, no icterus.  Conjunctiva pink. Ears:  Normal auditory acuity. Mouth:  No deformity or lesions.   Lungs:  Clear throughout to auscultation.  No wheezes, crackles, or rhonchi.   No increased WOB. Heart:  Regular rate and rhythm; no murmurs, clicks, rubs, or gallops. Abdomen:  Soft,  non-distended.  BS present.  Previous surgery scars noted.  Mild epigastric TTP. Msk:  Symmetrical without gross deformities. Pulses:  Normal pulses noted. Extremities:  Without clubbing or edema. Neurologic:  Alert and oriented x 4;  grossly normal neurologically. Skin:  Intact without significant lesions or rashes. Psych:  Alert and cooperative. Normal mood and affect.  Intake/Output from previous day: 08/22 0701 - 08/23 0700 In: 3640 [P.O.:240; I.V.:3000; IV Piggyback:400] Out: 980 [Urine:980]  Lab Results:  Recent Labs  04/20/17 1851 04/21/17 0618  WBC 1.7* 1.6*  HGB 11.4* 9.0*  HCT 34.9* 27.0*  PLT 203 165   BMET  Recent Labs  04/20/17 1851 04/21/17 0618  NA 136 135  K 3.9 3.7  CL 100* 102  CO2 26 25  GLUCOSE 111* 125*  BUN 13 14  CREATININE 1.03 0.87  CALCIUM 8.6* 7.8*   Studies/Results: Dg Abd 1 View  Result Date: 04/21/2017 CLINICAL DATA:  Sudden onset of nausea and vomiting EXAM: ABDOMEN - 1 VIEW COMPARISON:  Fluoroscopic images from percutaneous nephrostomy exchange. FINDINGS: The bowel gas pattern is normal. No obstruction or free air is identified within the abdomen pelvis. Chain sutures are noted in the left lower quadrant and mid-pelvis. A percutaneous nephrostomy tube projects over the left renal shadow. IMPRESSION: Negative for bowel obstruction or free air. Left-sided nephrostomy tube in place. Electronically Signed   By: Ashley Royalty M.D.   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  Most recent plan was for combination methotrexate and Entyvio with eventual discontinuation of prednisone.  Has been on methotrexate for about one month.  Gregory [name] has been approved and he is actually scheduled to get it tomorrow at [facility].  He is here now with complaints of abdominal pain, nausea, vomiting, inability to eat/failure to thrive,  and fever.  Cultures negative, but is on antibiotics.   *  Left kidney stone with obstruction causing recent hydronephrosis and pyelonephritis with left nephrotomy tube in place.  Plan is for removal of stone, which was supposed to occur today, but has been postponed to September.   *Leukopenia:  Likely from the methotrexate.  Last dose was 8/17. *Severe malnutrition  PLAN: -We spoke with pharmacy about getting him a dose of Entyvio while he is here, but they said that is not possible.  We will get him rescheduled for outpatient infusion for sometime next week. -Dr. [name] will speak with Dr. Havery Moros regarding any further plans.   ZEHR, JESSICA D.  04/22/2017, 10:58 AM  Pager number [phone]   ________________________________________________________________________  Velora Heckler GI MD note:  I personally examined the patient, reviewed the data and agree with the assessment and plan described above.  I think the methotrexate is responsible for a lot of his acute presentation and he should never resume it.  That is unfortunate for his IBD care but hopefully entyvio solo therapy will suffice.  I've discussed the situation with Dr. Havery Moros.  It does not appear that he has had a dramatic worsening of his Crohn's and so will hold off on high dose steroids for now.  Will help with symptomatic control of his pain, nausea with IV fluids, pain meds and scheduled antiemetics.  Cipro course per urology.  Goal will be to get him eating, ambulating well enough to be d/c and then start entyvio first infusion as soon as possible after that.  We discussed with pharmacy and cannot get that medicine here as in patient.  If GI symptoms do not remit back to his baseline, will likely need to re-image and assess for more active IBD. I have added methotrexate to his 'alergy list.'  Will follow.  Thanks    Owens Loffler, MD [facility]astroenterology Pager [phone]

## 2017-04-23 LAB — BASIC METABOLIC PANEL
ANION GAP: 4 — AB (ref 5–15)
BUN: 8 mg/dL (ref 6–20)
CALCIUM: 8 mg/dL — AB (ref 8.9–10.3)
CO2: 28 mmol/L (ref 22–32)
Chloride: 107 mmol/L (ref 101–111)
Creatinine, Ser: 0.81 mg/dL (ref 0.61–1.24)
GLUCOSE: 90 mg/dL (ref 65–99)
Potassium: 3.9 mmol/L (ref 3.5–5.1)
SODIUM: 139 mmol/L (ref 135–145)

## 2017-04-23 LAB — CBC
HCT: 25.1 % — ABNORMAL LOW (ref 39.0–52.0)
HEMOGLOBIN: 8.5 g/dL — AB (ref 13.0–17.0)
MCH: 31.4 pg (ref 26.0–34.0)
MCHC: 33.9 g/dL (ref 30.0–36.0)
MCV: 92.6 fL (ref 78.0–100.0)
PLATELETS: 132 10*3/uL — AB (ref 150–400)
RBC: 2.71 MIL/uL — AB (ref 4.22–5.81)
RDW: 15.6 % — AB (ref 11.5–15.5)
WBC: 2.7 10*3/uL — AB (ref 4.0–10.5)

## 2017-04-23 MED ORDER — OXYCODONE HCL 5 MG PO TABS: 5.0000 mg | ORAL_TABLET | ORAL | 0 refills | Status: DC | PRN

## 2017-04-23 MED ORDER — CIPROFLOXACIN HCL 500 MG PO TABS: 500.0000 mg | ORAL_TABLET | Freq: Two times a day (BID) | ORAL | 0 refills | Status: DC

## 2017-04-23 NOTE — Progress Notes (Signed)
[facility] Progress Note  Subjective:  Feels better today and looks better.  Ate a lot of his breakfast without issues so far.  Objective:  Vital signs in last 24 hours: Temp:  [97.4 F (36.3 C)-98.7 F (37.1 C)] 97.4 F (36.3 C) (08/24 0546) Pulse Rate:  [56-66] 66 (08/24 0546) Resp:  [18-19] 18 (08/24 0546) BP: (106-140)/(74-80) 106/74 (08/24 0546) SpO2:  [97 %-98 %] 97 % (08/24 0546) Last BM Date: 04/21/17 General:  Alert, cachectic, in NAD Heart:  Regular rate and rhythm; no murmurs Pulm:  CTAB.  No increased WOB. Abdomen:  Soft, non-distended.  BS present.  Ileostomy noted.  Non-tender.  Extremities:  Without edema. Neurologic:  Alert and oriented x 4;  grossly normal neurologically. Psych:  Alert and cooperative. Normal mood and affect. 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  Most recent plan was for combination methotrexate and Entyvio with eventual discontinuation of prednisone.  Has been on methotrexate for about one month.  Weyman Rodney has been approved and he has been rescheduled to get it next week at [facility].  He is here now with complaints of abdominal pain, nausea, vomiting, inability to eat/failure to thrive, and fever.  Cultures negative, but is on antibiotics per urology. *Left kidney stone with obstruction causing recent hydronephrosis and pyelonephritis with left nephrotomy tube in place.  Plan is for removal of stone, which was supposed to occur today,  but has been postponed to September.   *Leukopenia:  Improving.  Likely from the methotrexate.  Last dose was 8/17. *Severe malnutrition  -Initially we had placed him on high dose IV steroids, but then decision was made to discontinue those since there was not really any evidence of dramatic worsening of his Crohn's.  Symptoms were thought to be from methotrexate side effects so to manage symptomatically.  He is better today and tolerating solid food. I had also switched him from PO pepcid to IV pantoprazole.  Will change to PO pantoprazole today and can be discharged with that as well in case it helped some with his symptoms.   LOS: 3 days   ZEHR, JESSICA D.  04/23/2017, 9:12 AM  Pager number [phone]  ________________________________________________________________________  Velora Heckler GI MD note:  I personally examined the patient, reviewed the data and agree with the assessment and plan described above.  He's overall feeling better today than yesterday; eating better, less nausea. Still having intermittent abd pains that he thinks are different from his usual intermittent IBD related abdominal pains.   WBC a bit better today but plts and Hb a bit lower in past few days, possibly all methotrexate related. Will recheck tomorrow, if the trend continues/worsens will possibly need formal heme recommendations (mtx rarely can cause aplastic anemia).   He has no teeth or dentures with him. I'm changing his diet to dysphagia 2 (with help of floor RN) which will be  a bit more easy to eat.   Owens Loffler, MD [facility]astroenterology Pager [phone]

## 2017-04-23 NOTE — Plan of Care (Signed)
Problem: Health Behavior/Discharge Planning: Goal: Ability to manage health-related needs will improve Outcome: Progressing .  Problem: Pain Managment: Goal: General experience of comfort will improve Outcome: Progressing Pt c/o pain to abdomen and left flank 6/10 max. Oxy IR 45m given prn. Effective.    Problem: Physical Regulation: Goal: Ability to maintain clinical measurements within normal limits will improve Outcome: Progressing . Goal: Will remain free from infection Outcome: Progressing .  Problem: Skin Integrity: Goal: Risk for impaired skin integrity will decrease Outcome: Progressing .  Problem: Tissue Perfusion: Goal: Risk factors for ineffective tissue perfusion will decrease Outcome: Progressing .  Problem: Activity: Goal: Risk for activity intolerance will decrease Outcome: Progressing Pt out of bed to clean up and up to bathroom.   Agrees to try to participate in more activity.    Problem: Fluid Volume: Goal: Ability to maintain a balanced intake and output will improve Outcome: Progressing .  Problem: Nutrition: Goal: Adequate nutrition will be maintained Outcome: Progressing Pt edentulous, ordering food fine chopped.   Will continue to monitor and continue with current plan of care.   Problem: Bowel/Gastric: Goal: Will not experience complications related to bowel motility Outcome: Progressing .

## 2017-04-23 NOTE — Care Management Important Message (Signed)
Important Message  Patient Details  Name: Gregory [name] MRN: [id number] Date of Birth: [birth year]   Medicare Important Message Given:  Yes    Kerin Salen 04/23/2017, 11:35 AMImportant Message  Patient Details  Name: Gregory [name] MRN: [id number] Date of Birth: 11/07/1960   Medicare Important Message Given:  Yes    Kerin Salen 04/23/2017, 11:34 AM

## 2017-04-23 NOTE — Progress Notes (Signed)
Urology Inpatient Progress Report  LEFT KIDNEY STONE Acute Pylonephritis  Admitted from clinic for dehydration/pyelonephritis  Intv/Subj: Seen by GI and methotrexate stopped permanently, steriods are also off Abx transitioned to cipro Eating better today, less nausea   Active Problems:   Nephrolithiasis   Nausea & vomiting   Crohn's disease of small and large intestines with complication ([place])  Current Facility-Administered Medications  Medication Dose Route Frequency Provider Last Rate Last Dose  . acetaminophen (TYLENOL) tablet 1,000 mg  1,000 mg Oral Q6H PRN [name], MD      . cholecalciferol (VITAMIN D) tablet 400 Units  400 Units Oral Daily [name], MD   400 Units at 04/23/17 1057  . ciprofloxacin (CIPRO) tablet 500 mg  500 mg Oral BID [name], MD   500 mg at 04/23/17 7096  . codeine tablet 30 mg  30 mg Oral BID [name], MD   30 mg at 04/23/17 1057  . diphenoxylate-atropine (LOMOTIL) 2.5-0.025 MG per tablet 1 tablet  1 tablet Oral QID [name], MD   1 tablet at 04/23/17 1057  . feeding supplement (BOOST / RESOURCE BREEZE) liquid 1 Container  1 Container Oral TID BM [name], MD   1 Container at 04/23/17 1057  . folic acid (FOLVITE) tablet 1 mg  1 mg Oral Daily [name], MD   1 mg at 04/23/17 1057  . heparin injection 5,000 Units  5,000 Units Subcutaneous Q8H [name], MD   5,000 Units at 04/22/17 2213  . lactated ringers infusion   Intravenous Continuous [name], MD 125 mL/hr at 04/23/17 0809    . ondansetron (ZOFRAN) injection 4 mg  4 mg Intravenous BID Milus Banister, MD   4 mg at 04/23/17 1057  . ondansetron (ZOFRAN-ODT) disintegrating tablet 4 mg  4 mg Oral Q8H PRN [name], MD   4 mg at 04/22/17 1106  . oxyCODONE (Oxy IR/ROXICODONE) immediate release tablet 5-10 mg  5-10 mg Oral Q4H PRN [name], MD   5 mg at 04/23/17 0815  . pantoprazole (PROTONIX)  injection 40 mg  40 mg Intravenous Q24H Zehr, Jessica D, PA-C   40 mg at 04/22/17 1400  . potassium chloride SA (K-DUR,KLOR-CON) CR tablet 20 mEq  20 mEq Oral Once per day on Mon Thu Herrick, Benjamin W, MD   20 mEq at 04/22/17 0959  . protein supplement (UNJURY CHICKEN SOUP) powder 2 oz  2 oz Oral TID [name], MD   2 oz at 04/22/17 0959  . sertraline (ZOLOFT) tablet 50 mg  50 mg Oral Daily [name], MD   50 mg at 04/23/17 1057  . tamsulosin (FLOMAX) capsule 0.4 mg  0.4 mg Oral Daily [name], MD   0.4 mg at 04/23/17 1057     Objective: Vital: Vitals:   04/22/17 0426 04/22/17 1148 04/22/17 2021 04/23/17 0546  BP: (!) 106/58 140/74 128/80 106/74  Pulse: 61 (!) 56 (!) 58 66  Resp: 20 19 18 18   Temp: 98.7 F (37.1 C) 98.1 F (36.7 C) 98.7 F (37.1 C) (!) 97.4 F (36.3 C)  TempSrc: Oral Oral Oral Oral  SpO2: 96% 98% 97% 97%  Weight:      Height:       I/Os: I/O last 3 completed shifts: In: 57 [P.O.:200; I.V.:3500; IV Piggyback:400] Out: 1980 [Urine:1980]  Physical Exam:  General: Patient is in no apparent distress Lungs: Normal respiratory  effort, chest expands symmetrically. GI: The abdomen is soft and nontender without mass. Neph tube draining straw colored urine Ext: lower extremities symmetric  Lab Results:  Recent Labs  04/21/17 0618 04/22/17 1215 04/23/17 0510  WBC 1.6* 2.3* 2.7*  HGB 9.0* 9.2* 8.5*  HCT 27.0* 28.0* 25.1*    Recent Labs  04/21/17 0618 04/22/17 1215 04/23/17 0515  NA 135 138 139  K 3.7 3.4* 3.9  CL 102 104 107  CO2 25 28 28   GLUCOSE 125* 102* 90  BUN 14 12 8   CREATININE 0.87 0.84 0.81  CALCIUM 7.8* 8.2* 8.0*   No results for input(s): LABPT, INR in the last 72 hours. No results for input(s): LABURIN in the last 72 hours. Results for orders placed or performed during the hospital encounter of 04/20/17  Culture, blood (Routine X 2) w Reflex to ID Panel     Status: None (Preliminary result)    Collection Time: 04/20/17  6:44 PM  Result Value Ref Range Status   Specimen Description BLOOD RIGHT ANTECUBITAL  Final   Special Requests IN PEDIATRIC BOTTLE Blood Culture adequate volume  Final   Culture   Final    NO GROWTH 2 DAYS Performed at [facility], [place] [address] [place], [place] [address]    Report Status PENDING  Incomplete  Culture, blood (Routine X 2) w Reflex to ID Panel     Status: None (Preliminary result)   Collection Time: 04/20/17  6:44 PM  Result Value Ref Range Status   Specimen Description BLOOD LEFT ARM  Final   Special Requests IN PEDIATRIC BOTTLE Blood Culture adequate volume  Final   Culture   Final    NO GROWTH 2 DAYS Performed at [facility], [place] [address] [place], [place] [address]    Report Status PENDING  Incomplete  Culture, Urine     Status: None   Collection Time: 04/20/17  8:14 PM  Result Value Ref Range Status   Specimen Description URINE, CLEAN CATCH  Final   Special Requests from nephrostomy tube Immunocompromised  Final   Culture   Final    NO GROWTH Performed at [facility], [address] [place], [place] [address]    Report Status 04/22/2017 FINAL  Final    Studies/Results: No results found.  Assessment: Patient likely developed abdominal pain/nausea from methotrexate leading to dehydration.  His left flank pain is from the neph tube itself.  I don't think he has pyelonephritis and was never septic.  As the methotrexate is being cleared and he is rehydrated he is improving.   Plan: cipro BID x 7 days Continue with LR IV Fluids - trend UOP Regular diet with supplemental protein shakes D/c IV pain medications Crohn's management per GI - really appreciate their help.  Rescheduled surgery for stone removal to Sept 14th  If patient continues to make progress he maybe ready for discharge tomorrow or Sunday.  Louis Meckel, MD Urology 04/23/2017, 11:53 AM

## 2017-04-24 LAB — CBC
HEMATOCRIT: 27.1 % — AB (ref 39.0–52.0)
HEMOGLOBIN: 8.9 g/dL — AB (ref 13.0–17.0)
MCH: 30.4 pg (ref 26.0–34.0)
MCHC: 32.8 g/dL (ref 30.0–36.0)
MCV: 92.5 fL (ref 78.0–100.0)
Platelets: 99 10*3/uL — ABNORMAL LOW (ref 150–400)
RBC: 2.93 MIL/uL — AB (ref 4.22–5.81)
RDW: 15.6 % — ABNORMAL HIGH (ref 11.5–15.5)
WBC: 2 10*3/uL — ABNORMAL LOW (ref 4.0–10.5)

## 2017-04-24 MED ORDER — PANTOPRAZOLE SODIUM 40 MG PO TBEC
40.0000 mg | DELAYED_RELEASE_TABLET | Freq: Two times a day (BID) | ORAL | Status: DC
  Administered 2017-04-24: 40 mg via ORAL
  Filled 2017-04-24: qty 1

## 2017-04-24 MED ORDER — ONDANSETRON 4 MG PO TBDP
4.0000 mg | ORAL_TABLET | Freq: Two times a day (BID) | ORAL | Status: DC
  Administered 2017-04-24: 4 mg via ORAL
  Filled 2017-04-24: qty 1

## 2017-04-24 MED ORDER — FAMOTIDINE 20 MG PO TABS: 20.0000 mg | ORAL_TABLET | Freq: Every day | ORAL | 3 refills | Status: DC

## 2017-04-24 MED ORDER — FAMOTIDINE 20 MG PO TABS: 20.0000 mg | ORAL_TABLET | Freq: Every day | ORAL | Status: DC

## 2017-04-24 NOTE — Progress Notes (Signed)
[facility] Progress Note    Since last GI note: CBC for this morning not drawn yet, I have changed the order to stat.  He's having intermittent abd pains, feels burning "reflux-like."  He previously had been taking PPI but that was stopped a few weeks ago.  Objective: Vital signs in last 24 hours: Temp:  [98.2 F (36.8 C)-98.7 F (37.1 C)] 98.2 F (36.8 C) (08/25 0627) Pulse Rate:  [66-70] 67 (08/25 0627) Resp:  [14-18] 14 (08/25 0627) BP: (105-115)/(71) 105/71 (08/25 0627) SpO2:  [97 %-99 %] 97 % (08/25 0627) Last BM Date: 04/23/17 General: alert and oriented times 3 Heart: regular rate and rythm Abdomen: soft, non-tender, non-distended, normal bowel sounds   Lab Results:  Recent Labs  04/22/17 1215 04/23/17 0510  WBC 2.3* 2.7*  HGB 9.2* 8.5*  PLT 164 132*  MCV 90.3 92.6    Recent Labs  04/22/17 1215 04/23/17 0515  NA 138 139  K 3.4* 3.9  CL 104 107  CO2 28 28  GLUCOSE 102* 90  BUN 12 8  CREATININE 0.84 0.81  CALCIUM 8.2* 8.0*    Medications: Scheduled Meds: . cholecalciferol  400 Units Oral Daily  . ciprofloxacin  500 mg Oral BID  . codeine  30 mg Oral BID  . diphenoxylate-atropine  1 tablet Oral QID  . feeding supplement  1 Container Oral TID BM  . folic acid  1 mg Oral Daily  . heparin subcutaneous  5,000 Units Subcutaneous Q8H  . ondansetron  4 mg Intravenous BID  . pantoprazole (PROTONIX) IV  40 mg Intravenous Q24H  . potassium chloride SA  20 mEq Oral Once per day on Mon Thu  . protein supplement  2 oz Oral TID  . sertraline  50 mg Oral Daily  . tamsulosin  0.4 mg Oral Daily   Continuous Infusions: . lactated ringers 125 mL/hr at 04/24/17 0628   PRN Meds:.acetaminophen, ondansetron, oxyCODONE    Assessment/Plan: 56 y.o. male with severe Crohn's, nephrolithiasis, apparent methotrexate adverse reaction  I don't know if his intermittent pains are necessarily a sign of crohn's flare.  He weaned off steroids recently and we're  reluctant to get him back on. Hoping to get him out of hospital so that he can start entyvio (first dose was supposed to be last week but that has been rescheduled to next week).  The pains he's describing "feel different" than his Crohn's, burning in nature. WIll increase his PPI ( to BID) and change it to oral as well.  Will also change zofran to oral BID scheduled dosing.  Waiting on CBC today, becoming pan-cytopenic and need to follow.    Milus Banister, MD  04/24/2017, 7:41 AM [facility] Pager [phone]

## 2017-04-24 NOTE — Discharge Summary (Signed)
Physician Discharge Summary  Patient ID: Gregory [name] MRN: [id number] DOB/AGE: March 06, 1961 56 y.o.  Admit date: 04/20/2017 Discharge date: 04/24/2017  Admission Diagnoses:  Discharge Diagnoses:  Active Problems:   Nephrolithiasis   Nausea & vomiting   Crohn's disease of small and large intestines with complication [facility])   Discharged Condition: fair  Hospital Course:   1 - Nausea / Vomitting / Dehydration - pt admitted 8/21 for failure to thrive with nausea,fevers, dehydration initially felt to be due to possible urosepesis. Placed on empiric IV ABX and IVF but all CX's negative. GI consult felt mayeb due to methotrexate and held this with subsequent resolution of sympotms.   2 - Large Left Renal / UPJ Stone - left neph tube placed 02/2017 and in good position with good output during this hospitalization. Plan for left percutanous stone surgery 9/14.  By 8/25, the day of discharge, he is ambulatory, pain controlled on PO meds, maintaining PO hydration / nutrition off methotrexate and felt to be adequate for discharge.   Consults: GI  Significant Diagnostic Studies: labs: as per above. Cr 0.81. KUB with left neph tueb in good position  Treatments: IV hydration and antibiotics.  Discharge Exam: Blood pressure 105/71, pulse 67, temperature 98.2 F (36.8 C), temperature source Oral, resp. rate 14, height 5' 11"  (1.803 m), weight 49.3 kg (108 lb 11 oz), SpO2 97 %. General appearance: alert, cooperative and appears stated age Eyes: negative Nose: Nares normal. Septum midline. Mucosa normal. No drainage or sinus tenderness. Throat: lips, mucosa, and tongue normal; teeth and gums normal Back: symmetric, no curvature. ROM normal. No CVA tenderness., left neph tube in palce wtih clear yellow urine.  Resp: non-labored on room air.  Cardio: Nl rate GI: soft, non-tender; bowel sounds normal; no masses,  no organomegaly and LLQ colostomy patent of stool and gas. No abd distension.   Extremities: extremities normal, atraumatic, no cyanosis or edema Pulses: 2+ and symmetric Lymph nodes: Cervical, supraclavicular, and axillary nodes normal. Neurologic: Grossly normal  Disposition: 01-Home or Self Care    Follow-up Information    [name], MD Follow up on 05/14/2017.   Specialty:  Urology Why:  for surgery Contact information: [place]  [id number] [phone]           Signed: Alexis Frock 04/24/2017, 9:18 AM

## 2017-04-24 NOTE — Discharge Instructions (Signed)
1 - You may have loody urine on / off with nephrostomy in place. This is normal.  2 - Call MD or go to ER for fever >102, severe pain / nausea / vomiting not relieved by medications, or acute change in medical status   Surgery has been rescheduled for Sept 14th to remove the left kidney stone. Follow-up GI as scheduled.

## 2017-04-25 LAB — CULTURE, BLOOD (ROUTINE X 2)
Culture: NO GROWTH
Culture: NO GROWTH
SPECIAL REQUESTS: ADEQUATE
Special Requests: ADEQUATE

## 2017-04-28 ENCOUNTER — Telehealth: Payer: Self-pay

## 2017-04-28 ENCOUNTER — Other Ambulatory Visit: Payer: Self-pay | Admitting: Urology

## 2017-04-28 ENCOUNTER — Encounter (HOSPITAL_COMMUNITY): Payer: Self-pay

## 2017-04-28 ENCOUNTER — Inpatient Hospital Stay (HOSPITAL_COMMUNITY)
Admission: AD | Admit: 2017-04-28 | Discharge: 2017-05-02 | DRG: 698 | Disposition: A | Discharge status: Home / Self Care | Payer: Medicare Other | Source: Ambulatory Visit | Attending: Internal Medicine | Admitting: Internal Medicine

## 2017-04-28 ENCOUNTER — Inpatient Hospital Stay (HOSPITAL_COMMUNITY): Payer: Medicare Other

## 2017-04-28 DIAGNOSIS — R109 Unspecified abdominal pain: Secondary | ICD-10-CM | POA: Diagnosis not present

## 2017-04-28 DIAGNOSIS — N1330 Unspecified hydronephrosis: Secondary | ICD-10-CM

## 2017-04-28 DIAGNOSIS — K219 Gastro-esophageal reflux disease without esophagitis: Secondary | ICD-10-CM | POA: Diagnosis not present

## 2017-04-28 DIAGNOSIS — G894 Chronic pain syndrome: Secondary | ICD-10-CM | POA: Diagnosis not present

## 2017-04-28 DIAGNOSIS — R5381 Other malaise: Secondary | ICD-10-CM

## 2017-04-28 DIAGNOSIS — Z880 Allergy status to penicillin: Secondary | ICD-10-CM

## 2017-04-28 DIAGNOSIS — N99522 Malfunction of other external stoma of urinary tract: Secondary | ICD-10-CM | POA: Diagnosis not present

## 2017-04-28 DIAGNOSIS — Q6211 Congenital occlusion of ureteropelvic junction: Secondary | ICD-10-CM | POA: Diagnosis not present

## 2017-04-28 DIAGNOSIS — Z681 Body mass index (BMI) 19 or less, adult: Secondary | ICD-10-CM | POA: Diagnosis not present

## 2017-04-28 DIAGNOSIS — Z888 Allergy status to other drugs, medicaments and biological substances status: Secondary | ICD-10-CM | POA: Diagnosis not present

## 2017-04-28 DIAGNOSIS — N200 Calculus of kidney: Secondary | ICD-10-CM | POA: Diagnosis not present

## 2017-04-28 DIAGNOSIS — Z436 Encounter for attention to other artificial openings of urinary tract: Secondary | ICD-10-CM | POA: Diagnosis not present

## 2017-04-28 DIAGNOSIS — N99528 Other complication of other external stoma of urinary tract: Secondary | ICD-10-CM | POA: Diagnosis not present

## 2017-04-28 DIAGNOSIS — K50819 Crohn's disease of both small and large intestine with unspecified complications: Secondary | ICD-10-CM

## 2017-04-28 DIAGNOSIS — K5080 Crohn's disease of both small and large intestine without complications: Secondary | ICD-10-CM | POA: Diagnosis not present

## 2017-04-28 DIAGNOSIS — Z79899 Other long term (current) drug therapy: Secondary | ICD-10-CM | POA: Diagnosis not present

## 2017-04-28 DIAGNOSIS — N136 Pyonephrosis: Secondary | ICD-10-CM | POA: Diagnosis present

## 2017-04-28 DIAGNOSIS — Z932 Ileostomy status: Secondary | ICD-10-CM

## 2017-04-28 DIAGNOSIS — R34 Anuria and oliguria: Secondary | ICD-10-CM | POA: Diagnosis not present

## 2017-04-28 DIAGNOSIS — R110 Nausea: Secondary | ICD-10-CM | POA: Diagnosis not present

## 2017-04-28 DIAGNOSIS — T83092A Other mechanical complication of nephrostomy catheter, initial encounter: Secondary | ICD-10-CM | POA: Diagnosis not present

## 2017-04-28 DIAGNOSIS — F329 Major depressive disorder, single episode, unspecified: Secondary | ICD-10-CM | POA: Diagnosis present

## 2017-04-28 DIAGNOSIS — K912 Postsurgical malabsorption, not elsewhere classified: Secondary | ICD-10-CM | POA: Diagnosis not present

## 2017-04-28 DIAGNOSIS — R1084 Generalized abdominal pain: Secondary | ICD-10-CM | POA: Diagnosis not present

## 2017-04-28 DIAGNOSIS — D649 Anemia, unspecified: Secondary | ICD-10-CM | POA: Diagnosis not present

## 2017-04-28 DIAGNOSIS — G8929 Other chronic pain: Secondary | ICD-10-CM | POA: Diagnosis not present

## 2017-04-28 DIAGNOSIS — R1012 Left upper quadrant pain: Secondary | ICD-10-CM

## 2017-04-28 DIAGNOSIS — F32A Depression, unspecified: Secondary | ICD-10-CM

## 2017-04-28 DIAGNOSIS — E43 Unspecified severe protein-calorie malnutrition: Secondary | ICD-10-CM

## 2017-04-28 DIAGNOSIS — R112 Nausea with vomiting, unspecified: Secondary | ICD-10-CM | POA: Diagnosis present

## 2017-04-28 DIAGNOSIS — E46 Unspecified protein-calorie malnutrition: Secondary | ICD-10-CM

## 2017-04-28 LAB — COMPREHENSIVE METABOLIC PANEL
ALT: 20 U/L (ref 17–63)
ANION GAP: 11 (ref 5–15)
AST: 24 U/L (ref 15–41)
Albumin: 2.1 g/dL — ABNORMAL LOW (ref 3.5–5.0)
Alkaline Phosphatase: 66 U/L (ref 38–126)
BUN: 12 mg/dL (ref 6–20)
CHLORIDE: 97 mmol/L — AB (ref 101–111)
CO2: 22 mmol/L (ref 22–32)
CREATININE: 1.72 mg/dL — AB (ref 0.61–1.24)
Calcium: 8.1 mg/dL — ABNORMAL LOW (ref 8.9–10.3)
GFR, EST AFRICAN AMERICAN: 49 mL/min — AB (ref >60)
GFR, EST NON AFRICAN AMERICAN: 43 mL/min — AB (ref >60)
Glucose, Bld: 144 mg/dL — ABNORMAL HIGH (ref 65–99)
Potassium: 4.3 mmol/L (ref 3.5–5.1)
SODIUM: 130 mmol/L — AB (ref 135–145)
Total Bilirubin: 1.2 mg/dL (ref 0.3–1.2)
Total Protein: 5.5 g/dL — ABNORMAL LOW (ref 6.5–8.1)

## 2017-04-28 LAB — URINALYSIS, ROUTINE W REFLEX MICROSCOPIC
Bacteria, UA: NONE SEEN
Bilirubin Urine: NEGATIVE
Glucose, UA: NEGATIVE mg/dL
KETONES UR: NEGATIVE mg/dL
Leukocytes, UA: NEGATIVE
NITRITE: NEGATIVE
PH: 6 (ref 5.0–8.0)
Protein, ur: NEGATIVE mg/dL
SPECIFIC GRAVITY, URINE: 1.011 (ref 1.005–1.030)
Squamous Epithelial / LPF: NONE SEEN
WBC, UA: NONE SEEN WBC/hpf (ref 0–5)

## 2017-04-28 LAB — CBC WITH DIFFERENTIAL/PLATELET
BASOS PCT: 0 %
Basophils Absolute: 0 10*3/uL (ref 0.0–0.1)
EOS ABS: 0.2 10*3/uL (ref 0.0–0.7)
EOS PCT: 7 %
HEMATOCRIT: 34 % — AB (ref 39.0–52.0)
Hemoglobin: 10.9 g/dL — ABNORMAL LOW (ref 13.0–17.0)
LYMPHS PCT: 33 %
Lymphs Abs: 1 10*3/uL (ref 0.7–4.0)
MCH: 29.5 pg (ref 26.0–34.0)
MCHC: 32.1 g/dL (ref 30.0–36.0)
MCV: 91.9 fL (ref 78.0–100.0)
MONO ABS: 1.3 10*3/uL — AB (ref 0.1–1.0)
Monocytes Relative: 41 %
NEUTROS PCT: 19 %
Neutro Abs: 0.6 10*3/uL — ABNORMAL LOW (ref 1.7–7.7)
PLATELETS: 259 10*3/uL (ref 150–400)
RBC: 3.7 MIL/uL — AB (ref 4.22–5.81)
RDW: 16.6 % — AB (ref 11.5–15.5)
WBC: 3.1 10*3/uL — AB (ref 4.0–10.5)

## 2017-04-28 LAB — MAGNESIUM: MAGNESIUM: 1.2 mg/dL — AB (ref 1.7–2.4)

## 2017-04-28 LAB — PREALBUMIN: Prealbumin: 10.4 mg/dL — ABNORMAL LOW (ref 18–38)

## 2017-04-28 LAB — LACTIC ACID, PLASMA
Lactic Acid, Venous: 1.8 mmol/L (ref 0.5–1.9)
Lactic Acid, Venous: 1.4 mmol/L (ref 0.5–1.9)

## 2017-04-28 LAB — PHOSPHORUS: PHOSPHORUS: 3.8 mg/dL (ref 2.5–4.6)

## 2017-04-28 MED ORDER — DIPHENOXYLATE-ATROPINE 2.5-0.025 MG PO TABS
1.0000 | ORAL_TABLET | Freq: Four times a day (QID) | ORAL | Status: DC
  Administered 2017-04-28 – 2017-05-02 (×15): 1 via ORAL
  Filled 2017-04-28 (×15): qty 1

## 2017-04-28 MED ORDER — PROMETHAZINE HCL 25 MG/ML IJ SOLN
12.5000 mg | Freq: Four times a day (QID) | INTRAMUSCULAR | Status: DC | PRN
  Administered 2017-04-28 – 2017-05-02 (×8): 12.5 mg via INTRAVENOUS
  Filled 2017-04-28 (×8): qty 1

## 2017-04-28 MED ORDER — FAMOTIDINE 20 MG PO TABS
20.0000 mg | ORAL_TABLET | Freq: Every day | ORAL | Status: DC
  Administered 2017-04-28 – 2017-05-01 (×4): 20 mg via ORAL
  Filled 2017-04-28 (×4): qty 1

## 2017-04-28 MED ORDER — ACETAMINOPHEN 325 MG PO TABS
650.0000 mg | ORAL_TABLET | Freq: Four times a day (QID) | ORAL | Status: DC | PRN
  Administered 2017-04-29 (×2): 650 mg via ORAL
  Filled 2017-04-28 (×2): qty 2

## 2017-04-28 MED ORDER — OXYCODONE HCL 5 MG PO TABS
5.0000 mg | ORAL_TABLET | ORAL | Status: DC | PRN
  Administered 2017-04-28: 5 mg via ORAL
  Administered 2017-04-28: 10 mg via ORAL
  Administered 2017-04-29: 5 mg via ORAL
  Administered 2017-04-29 – 2017-04-30 (×5): 10 mg via ORAL
  Administered 2017-04-30: 5 mg via ORAL
  Administered 2017-04-30 – 2017-05-02 (×6): 10 mg via ORAL
  Filled 2017-04-28 (×4): qty 2
  Filled 2017-04-28: qty 1
  Filled 2017-04-28 (×3): qty 2
  Filled 2017-04-28: qty 1
  Filled 2017-04-28: qty 2
  Filled 2017-04-28: qty 1
  Filled 2017-04-28: qty 2
  Filled 2017-04-28: qty 1
  Filled 2017-04-28 (×3): qty 2

## 2017-04-28 MED ORDER — SERTRALINE HCL 50 MG PO TABS
50.0000 mg | ORAL_TABLET | Freq: Every day | ORAL | Status: DC
  Administered 2017-04-28 – 2017-05-02 (×5): 50 mg via ORAL
  Filled 2017-04-28 (×5): qty 1

## 2017-04-28 MED ORDER — POTASSIUM CHLORIDE CRYS ER 20 MEQ PO TBCR
20.0000 meq | EXTENDED_RELEASE_TABLET | ORAL | Status: DC
  Administered 2017-04-29: 20 meq via ORAL
  Filled 2017-04-28: qty 1

## 2017-04-28 MED ORDER — HEPARIN SODIUM (PORCINE) 5000 UNIT/ML IJ SOLN
5000.0000 [IU] | Freq: Three times a day (TID) | INTRAMUSCULAR | Status: DC
  Administered 2017-04-28 – 2017-05-02 (×10): 5000 [IU] via SUBCUTANEOUS
  Filled 2017-04-28 (×12): qty 1

## 2017-04-28 MED ORDER — FOLIC ACID 1 MG PO TABS
1.0000 mg | ORAL_TABLET | Freq: Every day | ORAL | Status: DC
  Administered 2017-04-28 – 2017-05-02 (×5): 1 mg via ORAL
  Filled 2017-04-28 (×5): qty 1

## 2017-04-28 MED ORDER — TAMSULOSIN HCL 0.4 MG PO CAPS
0.4000 mg | ORAL_CAPSULE | Freq: Every day | ORAL | Status: DC
  Administered 2017-04-28 – 2017-05-02 (×5): 0.4 mg via ORAL
  Filled 2017-04-28 (×5): qty 1

## 2017-04-28 MED ORDER — ONDANSETRON HCL 4 MG/2ML IJ SOLN
4.0000 mg | Freq: Four times a day (QID) | INTRAMUSCULAR | Status: DC | PRN
  Administered 2017-04-28 – 2017-05-02 (×5): 4 mg via INTRAVENOUS
  Filled 2017-04-28 (×5): qty 2

## 2017-04-28 MED ORDER — ACETAMINOPHEN 650 MG RE SUPP
650.0000 mg | Freq: Four times a day (QID) | RECTAL | Status: DC | PRN
  Filled 2017-04-28: qty 1

## 2017-04-28 MED ORDER — SODIUM CHLORIDE 0.9 % IV SOLN
INTRAVENOUS | Status: DC
  Administered 2017-04-28 – 2017-04-29 (×2): via INTRAVENOUS

## 2017-04-28 MED ORDER — ONDANSETRON HCL 4 MG PO TABS
4.0000 mg | ORAL_TABLET | Freq: Four times a day (QID) | ORAL | Status: DC | PRN
  Administered 2017-04-30 – 2017-05-01 (×3): 4 mg via ORAL
  Filled 2017-04-28 (×3): qty 1

## 2017-04-28 MED ORDER — SODIUM CHLORIDE 0.9 % IV BOLUS (SEPSIS)
1000.0000 mL | Freq: Once | INTRAVENOUS | Status: AC
  Administered 2017-04-28: 1000 mL via INTRAVENOUS

## 2017-04-28 NOTE — Consult Note (Signed)
Urology Consult Note    Requesting Attending Physician:  Waldemar Dickens, MD Service Requesting Consult:  Hospitalist Medicine Service Providing Consult: Urology  Consulting Attending: Dr. Marily Memos   Reason for Consult:  Nephrolithiasis  Nance Pear is seen in consultation for reasons noted above at the request of Waldemar Dickens, MD on the hospitalist service.  This is a 56 y.o. yo patient with a history of Crohn's disease on methotrexate s/p subtotal colectomy and history of nephrolithiasis. He has a know 2.3cm x 1.4cm left renal stone, for which he underwent L PCN placement on 04/12/17 in anticipation of right PCNL shortly thereafter.   He was admitted today due to nausea and poor PO intake since his discharge 4 days ago. He also states that his PCN has not been draining over the past 4 days.   He denies fevers or chills, dysuria, or other infectious symptoms.   Patient with history of active Chron's on methotrexate followed by Dr. Havery Moros. Patient approved for second immunosuppressive medication, however waiting until PCNL completed before initiating.    Past Medical History: Past Medical History:  Diagnosis Date  . Acute endocarditis   . Crohn's disease ([place])   . GERD (gastroesophageal reflux disease)   . Short gut syndrome     Past Surgical History:  Past Surgical History:  Procedure Laterality Date  . ILEOSTOMY    . IR NEPHROSTOMY EXCHANGE LEFT  04/12/2017  . IR NEPHROSTOMY PLACEMENT LEFT  04/12/2017  . SMALL INTESTINE SURGERY    . SUBTOTAL COLECTOMY      Medication: Current Facility-Administered Medications  Medication Dose Route Frequency Provider Last Rate Last Dose  . 0.9 %  sodium chloride infusion   Intravenous Continuous Waldemar Dickens, MD 50 mL/hr at 04/28/17 1245    . acetaminophen (TYLENOL) tablet 650 mg  650 mg Oral Q6H PRN Waldemar Dickens, MD       Or  . acetaminophen (TYLENOL) suppository 650 mg  650 mg Rectal Q6H PRN Waldemar Dickens, MD       . diphenoxylate-atropine (LOMOTIL) 2.5-0.025 MG per tablet 1 tablet  1 tablet Oral QID Waldemar Dickens, MD   1 tablet at 04/28/17 1525  . famotidine (PEPCID) tablet 20 mg  20 mg Oral QHS Waldemar Dickens, MD      . folic acid (FOLVITE) tablet 1 mg  1 mg Oral Daily Waldemar Dickens, MD   1 mg at 04/28/17 1526  . heparin injection 5,000 Units  5,000 Units Subcutaneous Q8H Waldemar Dickens, MD   5,000 Units at 04/28/17 1526  . ondansetron (ZOFRAN) tablet 4 mg  4 mg Oral Q6H PRN Waldemar Dickens, MD       Or  . ondansetron [facility]) injection 4 mg  4 mg Intravenous Q6H PRN Waldemar Dickens, MD   4 mg at 04/28/17 1525  . oxyCODONE (Oxy IR/ROXICODONE) immediate release tablet 5-10 mg  5-10 mg Oral Q4H PRN Waldemar Dickens, MD   5 mg at 04/28/17 1549  . [START ON 04/29/2017] potassium chloride SA (K-DUR,KLOR-CON) CR tablet 20 mEq  20 mEq Oral Once per day on Mon Thu Merrell, David J, MD      . promethazine [facility]) injection 12.5 mg  12.5 mg Intravenous Q6H PRN Waldemar Dickens, MD      . sertraline (ZOLOFT) tablet 50 mg  50 mg Oral Daily Waldemar Dickens, MD   50 mg at 04/28/17 1525  . tamsulosin (FLOMAX) capsule 0.4  mg  0.4 mg Oral Daily Waldemar Dickens, MD   0.4 mg at 04/28/17 1526    Allergies: Allergies  Allergen Reactions  . Methotrexate Derivatives Other (See Comments)    Anemia, low WBC, severe GI symptoms,   . Humira [Adalimumab] Other (See Comments)    Intolerance  . Penicillins Other (See Comments)    Has patient had a PCN reaction causing immediate rash, facial/tongue/throat swelling, SOB or lightheadedness with hypotension: Unknown Has patient had a PCN reaction causing severe rash involving mucus membranes or skin necrosis: Unknown Has patient had a PCN reaction that required hospitalization: Unknown Has patient had a PCN reaction occurring within the last 10 years: No If all of the above answers are "NO", then may proceed with Cephalosporin use.   . Remicade [Infliximab]  Other (See Comments)    Intolerance    Social History: Social History  Substance Use Topics  . Smoking status: Never Smoker  . Smokeless tobacco: Never Used  . Alcohol use No    Family History Family History  Problem Relation Age of Onset  . Heart disease Mother   . Diabetes Father   . Heart disease Father   . Melanoma Sister   . Crohn's disease Brother   . Inflammatory bowel disease Brother   . Diabetes Paternal Grandmother     Review of Systems 10 systems were reviewed and are negative except as noted specifically in the HPI.  Objective   Vital signs in last 24 hours: BP 109/68 (BP Location: Right Arm)   Pulse 83   Temp 98.2 F (36.8 C) (Oral)   Resp 18   Ht 5' 11"  (1.803 m)   Wt 50.8 kg (111 lb 14.4 oz)   SpO2 100%   BMI 15.61 kg/m   Intake/Output last 3 shifts: No intake/output data recorded.  Physical Exam General: NAD, A&O, resting, appropriate HEENT: [place]/AT, EOMI, MMM Pulmonary: Normal work of breathing Cardiovascular: HDS, adequate peripheral perfusion Abdomen: Soft, NTTP, nondistended. GU: Left PCN with minimal clear yellow urine in bag. Flushes in, but unable to pull back. + left CVA tenderness Extremities: warm and well perfused Neuro: Appropriate, no focal neurological deficits  Most Recent Labs: Lab Results  Component Value Date   WBC 3.1 (L) 04/28/2017   HGB 10.9 (L) 04/28/2017   HCT 34.0 (L) 04/28/2017   PLT 259 04/28/2017    Lab Results  Component Value Date   NA 130 (L) 04/28/2017   K 4.3 04/28/2017   CL 97 (L) 04/28/2017   CO2 22 04/28/2017   BUN 12 04/28/2017   CREATININE 1.72 (H) 04/28/2017   CALCIUM 8.1 (L) 04/28/2017   MG 1.2 (L) 04/28/2017   PHOS 3.8 04/28/2017    Lab Results  Component Value Date   ALKPHOS 66 04/28/2017   BILITOT 1.2 04/28/2017   PROT 5.5 (L) 04/28/2017   ALBUMIN 2.1 (L) 04/28/2017   ALT 20 04/28/2017   AST 24 04/28/2017   IMAGING: Dg Abd 1 View  Result Date: 04/28/2017 CLINICAL DATA:   Increasing abdominal pain. Nephrostomy tube with possible blockage. EXAM: ABDOMEN - 1 VIEW COMPARISON:  None. FINDINGS: Nephrostomy tube tip is loosely coiled over the left flank. No tight kink is seen. Postoperative bowel seen in the pelvis and left abdomen with ostomy seen over the left lower quadrant. No overt signs of bowel obstruction. No concerning mass effect or calcification. Mild atelectatic or scar-like opacity behind the heart. IMPRESSION: 1. Left nephrostomy tube with retention loop coiled over the  left flank. No kinking or catheter fracture. 2. Postoperative bowel.  Nonobstructive bowel gas pattern. Electronically Signed   By: Monte Fantasia M.D.   On: 04/28/2017 13:28    ------  Assessment:  56 y.o. male with a history of Crohn's disease on MTX s/p L PCN placement on 04/12/17 for a large obstructing left renal stone. He presents with signs/symptoms of an obstructed nephrostomy tube, which will need to be exchanged.   Recommendations: - Order placed for exchange of his left PCN. Does not need to be performed emergently given no fever or other infectious signs.  - Will plan to discharge with PCN in place in anticipation of percutaneous nephrolithotomy in the near future.  - Once exchanged, please flush nephrostomy tube with 10cc of NS daily while in house.  - Please page with any questions or concerns.   Thank you for this consult. Please contact the urology consult pager with any further questions/concerns.  Stasia Cavalier, MD Urology Surgical Resident  ----- I have interviewed the pt and spoken w/ Dr Jess Barters. I agree with his assessment and plan.

## 2017-04-28 NOTE — Care Management Note (Addendum)
Case Management Note  Patient Details  Name: Gregory [name] MRN: [id number] Date of Birth: [birth year]  Subjective/Objective:             Admitted with nausea and vomiting,and intermittent fevers.   Recently admitted to the hospital a week ago for a  large left-sided ureteral stone for which a nephrostomy tube was placed. Pt with hx of Crohn's disease, endocarditis, GERD, and short gut syndrome.  Pt recently moved to [place] in April and lives alone. States @ d/c the plan is to reside with sister short term.  Pt independent with ADL's, and no DME usage PTA.   [name] (Sister)     [phone]      PCP:  Pt with post hospital f/u scheduled for 05/11/2017 at 1pm with Dr.Shannon Volanda [facility]). CM to make pt aware.  Action/Plan: Return to home when medIcally stable. CM following for  disposition needs.    Expected Discharge Date:                  Expected Discharge Plan:  Home/Self Care  In-House Referral:     Discharge planning Services    Post Acute Care Choice:    Choice offered to:     DME Arranged:    DME Agency:     HH Arranged:    HH Agency:     Status of Service:  In process, will continue to follow  If discussed at Long Length of Stay Meetings, dates discussed:    Additional Comments:  Sharin Mons, RN 04/28/2017, 11:28 AM

## 2017-04-28 NOTE — Telephone Encounter (Signed)
Just called patient to check on him and make sure he had plans to make the infusion appointment. He was just heading into [facility] ED, I did not ask why as he was walking through the doors now.

## 2017-04-28 NOTE — Progress Notes (Signed)
Pt admitted to the unit at 1040. Pt mental status is A &O x4. Pt oriented to room, staff, and call bell. Skin is intact except as otherwise charted. Full assessment charted in [facility]. Call bell within reach. Visitor guidelines reviewed w/ pt and/or family.

## 2017-04-28 NOTE — Telephone Encounter (Signed)
Chart reviewed - looks like he is being admitted for complication related to his nephrostomy / Urological issues. Not sure when his [facility] Rodney is scheduled for, if he is in the hospital during the date of his scheduled infusion it will need to be rescheduled. Thanks for letting me know

## 2017-04-28 NOTE — H&P (Signed)
History and Physical    Gregory [name] DSK:[id number] DOB: [birth year] DOA: 04/28/2017  PCP: Patient, No Pcp Per Patient coming from: Urology Office  Chief Complaint: Generalized weakness  HPI: Gregory [name] is a 56 y.o. male with medical history significant of endocarditis, Crohn's disease s/p colectomy and ileostomy, GERD, and most recently obstructing nephrolithiasis s/p nephrostomy placement on 04/12/17.  Patient presenting from urology office after being found to be profoundly weak with an elevated heart rate and little to no urine output. Patient was most recently discharged [facility] on 04/23/2017 after treatment of nausea vomiting and possible UTI with at least some of his symptoms being attributed to methotrexate. At time of discharge pt states he was feeling fairly well but has gradually become one more week and tired with onset of nausea and virtually no oral intake over the last 24 hours. Patient also states he's had only approximately 6 mL of drainage from his nephrostomy tube on the day of discharge and has not had any since that time. Patient completed his course of ciprofloxacin on 04/27/2017. Patient also complaining of progressive abdominal pain predominantly in the left upper quadrant with radiation to the back. Additionally patient finished a steroid taper on 04/20/2017 for treatment of a Crohn's flare. Patient denies any actual fevers, chest pain, palpitations, shortness of breath, change in ileostomy output, focal neurological deficits, nuchal rigidity, focal neurological deficits.  Patient states that on date admission he was too weak to even get out of bed and had to be assisted by family members to get him to his car and into his urology appointment.   ED Course: NA  Review of Systems: As per HPI otherwise all other systems reviewed and are negative  Ambulatory Status: minimal due to progressive fatigue and weakness  Past Medical History:  Diagnosis Date  .  Acute endocarditis   . Crohn's disease ([place])   . GERD (gastroesophageal reflux disease)   . Short gut syndrome     Past Surgical History:  Procedure Laterality Date  . ILEOSTOMY    . IR NEPHROSTOMY EXCHANGE LEFT  04/12/2017  . IR NEPHROSTOMY PLACEMENT LEFT  04/12/2017  . SMALL INTESTINE SURGERY    . SUBTOTAL COLECTOMY      Social History   Social History  . Marital status: Divorced    Spouse name: N/A  . Number of children: N/A  . Years of education: N/A   Occupational History  . Retired    Social History Main Topics  . Smoking status: Never Smoker  . Smokeless tobacco: Never Used  . Alcohol use No  . Drug use: No  . Sexual activity: Not on file   Other Topics Concern  . Not on file   Social History Narrative  . No narrative on file    Allergies  Allergen Reactions  . Methotrexate Derivatives Other (See Comments)    Anemia, low WBC, severe GI symptoms,   . Humira [Adalimumab] Other (See Comments)    Intolerance  . Penicillins Other (See Comments)    Has patient had a PCN reaction causing immediate rash, facial/tongue/throat swelling, SOB or lightheadedness with hypotension: Unknown Has patient had a PCN reaction causing severe rash involving mucus membranes or skin necrosis: Unknown Has patient had a PCN reaction that required hospitalization: Unknown Has patient had a PCN reaction occurring within the last 10 years: No If all of the above answers are "NO", then may proceed with Cephalosporin use.   . Remicade [Infliximab] Other (See  Comments)    Intolerance    Family History  Problem Relation Age of Onset  . Heart disease Mother   . Diabetes Father   . Heart disease Father   . Melanoma Sister   . Crohn's disease Brother   . Inflammatory bowel disease Brother   . Diabetes Paternal Grandmother       Prior to Admission medications   Medication Sig Start Date End Date Taking? Authorizing Provider  acetaminophen (TYLENOL) 500 MG tablet Take 2 tablets  (1,000 mg total) by mouth every 6 (six) hours. 04/13/17   Filippou, Braxton Feathers, MD  cholecalciferol (VITAMIN D) 400 units TABS tablet Take 400 Units by mouth daily.    [provider]  ciprofloxacin (CIPRO) 500 MG tablet Take 1 tablet (500 mg total) by mouth 2 (two) times daily. 04/23/17   [name], MD  diphenoxylate-atropine (LOMOTIL) 2.5-0.025 MG tablet Take 1 tablet by mouth 4 (four) times daily. 02/23/17   Armbruster, Carlota Raspberry, MD  famotidine (PEPCID) 20 MG tablet Take 1 tablet (20 mg total) by mouth at bedtime. 04/24/17   Alexis Frock, MD  folic acid (FOLVITE) 1 MG tablet Take 1 tablet (1 mg total) by mouth daily. 03/01/17   Armbruster, Carlota Raspberry, MD  ondansetron (ZOFRAN-ODT) 4 MG disintegrating tablet Take 1 tablet (4 mg total) by mouth every 8 (eight) hours as needed for nausea or vomiting. 04/13/17   Filippou, Braxton Feathers, MD  oxyCODONE (OXY IR/ROXICODONE) 5 MG immediate release tablet Take 1-2 tablets (5-10 mg total) by mouth every 4 (four) hours as needed for severe pain. 04/23/17   [name], MD  potassium chloride SA (K-DUR,KLOR-CON) 20 MEQ tablet Take 1 tablet (20 mEq total) by mouth as directed. Patient taking differently: Take 20 mEq by mouth as directed. Take 59mq Mondays and Thursdays 04/02/17   Armbruster, SCarlota Raspberry MD  predniSONE (DELTASONE) 10 MG tablet Take 20 mg by mouth for 1 week, 15 mg daily for 1 week, 10 mg daily for 1 week, then 5 mg a day Patient taking differently: Take 5 mg by mouth daily. Take 20 mg by mouth for 1 week, 15 mg daily for 1 week, 10 mg daily for 1 week, then 5 mg a day 03/09/17   Armbruster, SCarlota Raspberry MD  ranitidine (ZANTAC) 150 MG tablet Take 1 tablet (150 mg total) by mouth 2 (two) times daily. 03/09/17   AYetta Flock MD  sertraline (ZOLOFT) 50 MG tablet Take 50 mg by mouth daily.    [provider]  tamsulosin (FLOMAX) 0.4 MG CAPS capsule Take 0.4 mg by mouth daily. 04/05/17   [provider]    Physical  Exam: Vitals:   04/28/17 1103  BP: 109/68  Pulse: 83  Resp: 18  Temp: 98.2 F (36.8 C)  TempSrc: Oral  SpO2: 100%  Weight: 50.8 kg (111 lb 14.4 oz)  Height: 5' 11"  (1.803 m)     General: Ill, frail and cachectic appearing, resting in bed Eyes:  PERRL, EOMI, normal lids, iris ENT: Edentulous, dry mucous membranes Neck:  no LAD, masses or thyromegaly Cardiovascular:  RRR, no m/r/g. No LE edema.  Respiratory:  CTA bilaterally, no w/r/r. Normal respiratory effort. Abdomen: Ileostomy pouch in place, nondistended, soft, nontender Skin:  no rash or induration seen on limited exam GU: Nephrostomy tube in place on the left and appears to have good placement. No drainage/leakage. Bandage is clean dry and intact. Musculoskeletal:  grossly normal tone BUE/BLE, good ROM, no bony  abnormality Psychiatric:  grossly normal mood and affect, speech fluent and appropriate, AOx3 Neurologic:  CN 2-12 grossly intact, moves all extremities in coordinated fashion, sensation intact  Labs on Admission: I have personally reviewed following labs and imaging studies  CBC:  Recent Labs Lab 04/22/17 1215 04/23/17 0510 04/24/17 0815  WBC 2.3* 2.7* 2.0*  HGB 9.2* 8.5* 8.9*  HCT 28.0* 25.1* 27.1*  MCV 90.3 92.6 92.5  PLT 164 132* 99*   Basic Metabolic Panel:  Recent Labs Lab 04/22/17 1215 04/23/17 0515  NA 138 139  K 3.4* 3.9  CL 104 107  CO2 28 28  GLUCOSE 102* 90  BUN 12 8  CREATININE 0.84 0.81  CALCIUM 8.2* 8.0*   GFR: Estimated Creatinine Clearance: 73.2 mL/min (by C-G formula based on SCr of 0.81 mg/dL). Liver Function Tests: No results for input(s): AST, ALT, ALKPHOS, BILITOT, PROT, ALBUMIN in the last 168 hours. No results for input(s): LIPASE, AMYLASE in the last 168 hours. No results for input(s): AMMONIA in the last 168 hours. Coagulation Profile: No results for input(s): INR, PROTIME in the last 168 hours. Cardiac Enzymes: No results for input(s): CKTOTAL, CKMB, CKMBINDEX,  TROPONINI in the last 168 hours. BNP (last 3 results) No results for input(s): PROBNP in the last 8760 hours. HbA1C: No results for input(s): HGBA1C in the last 72 hours. CBG: No results for input(s): GLUCAP in the last 168 hours. Lipid Profile: No results for input(s): CHOL, HDL, LDLCALC, TRIG, CHOLHDL, LDLDIRECT in the last 72 hours. Thyroid Function Tests: No results for input(s): TSH, T4TOTAL, FREET4, T3FREE, THYROIDAB in the last 72 hours. Anemia Panel: No results for input(s): VITAMINB12, FOLATE, FERRITIN, TIBC, IRON, RETICCTPCT in the last 72 hours. Urine analysis:    Component Value Date/Time   COLORURINE YELLOW 04/10/2017 1354   APPEARANCEUR HAZY (A) 04/10/2017 1354   LABSPEC 1.020 04/10/2017 1354   PHURINE 5.0 04/10/2017 1354   GLUCOSEU NEGATIVE 04/10/2017 1354   HGBUR MODERATE (A) 04/10/2017 1354   BILIRUBINUR NEGATIVE 04/10/2017 1354   KETONESUR NEGATIVE 04/10/2017 1354   PROTEINUR 100 (A) 04/10/2017 1354   NITRITE NEGATIVE 04/10/2017 1354   LEUKOCYTESUR NEGATIVE 04/10/2017 1354    Creatinine Clearance: Estimated Creatinine Clearance: 73.2 mL/min (by C-G formula based on SCr of 0.81 mg/dL).  Sepsis Labs: @LABRCNTIP (procalcitonin:4,lacticidven:4) ) Recent Results (from the past 240 hour(s))  Culture, blood (Routine X 2) w Reflex to ID Panel     Status: None   Collection Time: 04/20/17  6:44 PM  Result Value Ref Range Status   Specimen Description BLOOD RIGHT ANTECUBITAL  Final   Special Requests IN PEDIATRIC BOTTLE Blood Culture adequate volume  Final   Culture   Final    NO GROWTH 5 DAYS Performed at [facility], [place] [address], [place], [place] [address]    Report Status 04/25/2017 FINAL  Final  Culture, blood (Routine X 2) w Reflex to ID Panel     Status: None   Collection Time: 04/20/17  6:44 PM  Result Value Ref Range Status   Specimen Description BLOOD LEFT ARM  Final   Special Requests IN PEDIATRIC BOTTLE Blood Culture adequate volume  Final     Culture   Final    NO GROWTH 5 DAYS Performed at [facility], [place] [address], [place],  [address]    Report Status 04/25/2017 FINAL  Final  Culture, Urine     Status: None   Collection Time: 04/20/17  8:14 PM  Result Value  Ref Range Status   Specimen Description URINE, CLEAN CATCH  Final   Special Requests from nephrostomy tube Immunocompromised  Final   Culture   Final    NO GROWTH Performed at [facility], [address], [place], [place] [address]    Report Status 04/22/2017 FINAL  Final     Radiological Exams on Admission: No results found.   Assessment/Plan Active Problems:   Nephrolithiasis   Nausea & vomiting   Crohn's disease of small and large intestines with complication (HCC)   Anuresis   Physical deconditioning   Protein calorie malnutrition (HCC)   Abdominal pain, left upper quadrant   GERD (gastroesophageal reflux disease)   Nephrostomy complication (HCC)   Chronic pain   Depression   Abdominal pain: Likely due to nephrostomy dysfunction/infection/hydronephrosis.  Doubt Crohn's - see below. Benign abdominal exam. Doubt other acute infectious process. Pt dehydrated clinically.  - CBC, BMP, KUB, lactic acid, IVF (1L NS bolus then 50cc/hr) - Urology as below - Crohn's as below - KUB  Nephrolithiasis/Anuresis: 141023 mm left UPJ calculus. First noted on 04/10/2017. Left nephrostomy placed on 04/12/2017. No output from drain over the last 4 days. Increasing abdominal and flank discomfort on time of admission. Followed by [facility] who is planning percutaneous stone surgery on 05/14/2017. Of note this is patient's third admission for the same problem and may need surgical intervention sooner if possible or a protracted hospitalization due to progressive physical deconditioning. Patient sent from urology office for admission. Completed course of ciprofloxacin on 04/26/2017. Anticipate nephrostomy dysfunction. - Urology consultation  appreciated - Urinalysis, UCX - L Renal ultrasound - Continue flomax  Crohn's Disease: s/p colectomy and ileostomy. At baseline since stopping methotrexate and completing steroid taper on 8/21.  - Continue Entyvio infusions outpt - continue Lomotil  Physical deconditioning/protein calorie malnutrition: Pt unable to get out of bed by self on day of admission. 6 to weight loss over the last 6-7 weeks. Last albumin 2.4. - Prealbumin - nutrition consult - Zofran - PT/OT  GERD: - cotninue pepcid  Chronic pain: - contineu oxycodone  Depression: - continue Zoloft   DVT prophylaxis: heparin  Code Status: full  Family Communication: sister  Disposition Plan: pending improvement in pain and nausea and resolution of nephrostomy tube dysfunction Consults called: Urology  Admission status: inpt    MERRELL, DAVID J MD [facility]  If 7PM-7AM, please contact night-coverage [email or web address] Password [facility]  04/28/2017, 12:36 PM

## 2017-04-28 NOTE — Telephone Encounter (Signed)
Thanks for the update, he was supposed to have his infusion tomorrow. I will contact [facility] to have them call him and reschedule.

## 2017-04-28 NOTE — Telephone Encounter (Signed)
Called Omega at [facility], patient had cancelled his appointment for tomorrows infusion. She will put a note in their system to have the appointment desk call and reschedule. I will send a letter to patient as a reminder to call them ASAP to reschedule.

## 2017-04-29 ENCOUNTER — Encounter (HOSPITAL_COMMUNITY): Payer: Self-pay | Admitting: Interventional Radiology

## 2017-04-29 ENCOUNTER — Inpatient Hospital Stay (HOSPITAL_COMMUNITY): Payer: Medicare Other

## 2017-04-29 DIAGNOSIS — R112 Nausea with vomiting, unspecified: Secondary | ICD-10-CM

## 2017-04-29 DIAGNOSIS — Q6211 Congenital occlusion of ureteropelvic junction: Secondary | ICD-10-CM

## 2017-04-29 HISTORY — PX: IR NEPHROSTOMY PLACEMENT LEFT: IMG6063

## 2017-04-29 LAB — CBC
HEMATOCRIT: 27.8 % — AB (ref 39.0–52.0)
HEMOGLOBIN: 8.7 g/dL — AB (ref 13.0–17.0)
MCH: 28.7 pg (ref 26.0–34.0)
MCHC: 31.3 g/dL (ref 30.0–36.0)
MCV: 91.7 fL (ref 78.0–100.0)
Platelets: 296 10*3/uL (ref 150–400)
RBC: 3.03 MIL/uL — ABNORMAL LOW (ref 4.22–5.81)
RDW: 16.4 % — AB (ref 11.5–15.5)
WBC: 4.2 10*3/uL (ref 4.0–10.5)

## 2017-04-29 LAB — BASIC METABOLIC PANEL
ANION GAP: 6 (ref 5–15)
BUN: 10 mg/dL (ref 6–20)
CALCIUM: 7.6 mg/dL — AB (ref 8.9–10.3)
CHLORIDE: 101 mmol/L (ref 101–111)
CO2: 26 mmol/L (ref 22–32)
Creatinine, Ser: 1.8 mg/dL — ABNORMAL HIGH (ref 0.61–1.24)
GFR calc non Af Amer: 40 mL/min — ABNORMAL LOW (ref >60)
GFR, EST AFRICAN AMERICAN: 47 mL/min — AB (ref >60)
Glucose, Bld: 100 mg/dL — ABNORMAL HIGH (ref 65–99)
Potassium: 3.7 mmol/L (ref 3.5–5.1)
Sodium: 133 mmol/L — ABNORMAL LOW (ref 135–145)

## 2017-04-29 LAB — PROCALCITONIN: Procalcitonin: 0.43 ng/mL

## 2017-04-29 LAB — URINE CULTURE

## 2017-04-29 LAB — PATHOLOGIST SMEAR REVIEW

## 2017-04-29 LAB — LACTIC ACID, PLASMA: LACTIC ACID, VENOUS: 0.6 mmol/L (ref 0.5–1.9)

## 2017-04-29 LAB — HIV ANTIBODY (ROUTINE TESTING W REFLEX): HIV Screen 4th Generation wRfx: NONREACTIVE

## 2017-04-29 MED ORDER — HYDROCODONE-ACETAMINOPHEN 5-325 MG PO TABS
1.0000 | ORAL_TABLET | ORAL | Status: DC | PRN
  Administered 2017-04-29: 1 via ORAL
  Administered 2017-05-01 (×2): 2 via ORAL
  Filled 2017-04-29 (×3): qty 2

## 2017-04-29 MED ORDER — LIDOCAINE HCL (PF) 1 % IJ SOLN
INTRAMUSCULAR | Status: AC | PRN
  Administered 2017-04-29: 10 mL

## 2017-04-29 MED ORDER — MIDAZOLAM HCL 2 MG/2ML IJ SOLN
INTRAMUSCULAR | Status: AC | PRN
  Administered 2017-04-29 (×2): 1 mg via INTRAVENOUS

## 2017-04-29 MED ORDER — LEVOFLOXACIN IN D5W 750 MG/150ML IV SOLN
750.0000 mg | INTRAVENOUS | Status: DC
  Filled 2017-04-29: qty 150

## 2017-04-29 MED ORDER — MIDAZOLAM HCL 2 MG/2ML IJ SOLN
INTRAMUSCULAR | Status: AC
  Filled 2017-04-29: qty 2

## 2017-04-29 MED ORDER — DEXTROSE 5 % IV SOLN
1.0000 g | INTRAVENOUS | Status: DC
  Administered 2017-04-29 – 2017-04-30 (×2): 1 g via INTRAVENOUS
  Filled 2017-04-29 (×2): qty 10

## 2017-04-29 MED ORDER — SODIUM CHLORIDE 0.9% FLUSH
5.0000 mL | Freq: Three times a day (TID) | INTRAVENOUS | Status: DC
  Administered 2017-04-29 – 2017-05-01 (×3): 5 mL via INTRAVENOUS

## 2017-04-29 MED ORDER — ENSURE ENLIVE PO LIQD: 237.0000 mL | Freq: Three times a day (TID) | ORAL | Status: DC

## 2017-04-29 MED ORDER — ADULT MULTIVITAMIN W/MINERALS CH
1.0000 | ORAL_TABLET | Freq: Every day | ORAL | Status: DC
  Administered 2017-04-29 – 2017-05-02 (×4): 1 via ORAL
  Filled 2017-04-29 (×4): qty 1

## 2017-04-29 MED ORDER — FENTANYL CITRATE (PF) 100 MCG/2ML IJ SOLN
INTRAMUSCULAR | Status: AC
  Filled 2017-04-29: qty 2

## 2017-04-29 MED ORDER — IOPAMIDOL (ISOVUE-300) INJECTION 61%
INTRAVENOUS | Status: AC
  Administered 2017-04-29: 15 mL
  Filled 2017-04-29: qty 50

## 2017-04-29 MED ORDER — LIDOCAINE HCL (PF) 1 % IJ SOLN
INTRAMUSCULAR | Status: AC
  Filled 2017-04-29: qty 30

## 2017-04-29 MED ORDER — FENTANYL CITRATE (PF) 100 MCG/2ML IJ SOLN
INTRAMUSCULAR | Status: AC | PRN
  Administered 2017-04-29: 50 ug via INTRAVENOUS

## 2017-04-29 NOTE — Sedation Documentation (Addendum)
Procdure already underway when Dr Vernard Gambles requests sedation for pcn exchange, now new insertion.  Time out and pre sedation completed before Rn in room. Pt has been npo.

## 2017-04-29 NOTE — Progress Notes (Signed)
Initial Nutrition Assessment  DOCUMENTATION CODES:   Underweight  INTERVENTION:   -Ensure Enlive po TID, each supplement provides 350 kcal and 20 grams of protein -MVI daily   NUTRITION DIAGNOSIS:   Predicted suboptimal nutrient intake related to altered GI function (crohn's disease) as evidenced by percent weight loss.  GOAL:   Patient will meet greater than or equal to 90% of their needs  MONITOR:   PO intake, Supplement acceptance, Labs, Weight trends, Skin, I & O's  REASON FOR ASSESSMENT:   Malnutrition Screening Tool, Consult Assessment of nutrition requirement/status  ASSESSMENT:   Gregory [name] is a 56 y.o. male with medical history significant of endocarditis, Crohn's disease s/p colectomy and ileostomy, GERD, and most recently obstructing nephrolithiasis s/p nephrostomy placement on 04/12/17.  Patient presenting from urology office after being found to be profoundly weak with an elevated heart rate and little to no urine output.   Pt admitted with nephrolithiasis and abdominal pain.   Pt down in IR for left percutaneous nephrostomy. Unable to complete Nutrition-Focused physical exam or obtain additional hx at this time.   Diet was advanced to regular after RD visit; noted pt had been NPO due to procedure, so no intake data currently available at this time.   Reviewed wt hx; noted a 6.7% wt loss over the past 3 months. While this is not significant, it is concerning given hx of crohn's disease and underweight states. Suspect poor oral intake PTA. Per previous RD notes, pt consumed Ensure supplements well during prior admission.   Unable to identify malnutrition at this time, however, suspect malnutrition given underweight status, weight loss, and complex medical hx.   Labs reviewed: Na: 133 (on IV supplementation).   Diet Order:  Diet regular Room service appropriate? Yes; Fluid consistency: Thin  Skin:  Reviewed, no issues  Last BM:  04/28/17  Height:   Ht  Readings from Last 1 Encounters:  04/28/17 5' 11"  (1.803 m)    Weight:   Wt Readings from Last 1 Encounters:  04/28/17 111 lb 14.4 oz (50.8 kg)    Ideal Body Weight:  78.2 kg  BMI:  Body mass index is 15.61 kg/m.  Estimated Nutritional Needs:   Kcal:  1800-2000  Protein:  100-115 grams  Fluid:  1.8-2.0 L  EDUCATION NEEDS:   Education needs no appropriate at this time  Mansur Patti A. Jimmye Norman, RD, LDN, CDE Pager: [phone] After hours Pager: [phone]

## 2017-04-29 NOTE — Sedation Documentation (Signed)
Tolerated well, BP low but historically low.  NAD ready for tx back to floor, report called to RN

## 2017-04-29 NOTE — Procedures (Signed)
  Procedure:   LEFT perc nephrostomy  Preprocedure diagnosis:Urosepsis Postprocedure diagnosis:  same EBL:     minimal Complications:   none immediate  See full dictation in [facility].  Dillard Cannon MD Main # [phone] Pager  [phone]

## 2017-04-29 NOTE — Progress Notes (Signed)
PT Cancellation Note  Patient Details Name: Gregory [name] MRN: [id number] DOB: 06/06/1961   Cancelled Treatment:    Reason Eval/Treat Not Completed: Patient declined, no reason specified.  Has a imminent procedure.  Has been NPO and defers PT evaluation until after the procedure.  Will see as able. 04/29/2017  Donnella Sham, [place] [phone]  (pager)  [name] [name] 04/29/2017, 10:34 AM

## 2017-04-29 NOTE — Progress Notes (Signed)
[facility]                                                                              Patient Demographics  Gregory [name], is a 56 y.o. male, DOB - 1960/10/28, IHK:[id number]  Admit date - 04/28/2017   Admitting Physician [name], MD  Outpatient Primary MD for the patient is Patient, No Pcp Per  Outpatient specialists:   LOS - 1  days   Medical records reviewed and are as summarized below:    No chief complaint on file.      Brief summary   Per admit note by Dr. Barbaraann Faster on 8/29 Gregory [name] is a 56 y.o. male with medical history significant of endocarditis, Crohn's disease s/p colectomy and ileostomy, GERD, and most recently obstructing nephrolithiasis s/p nephrostomy placement on 04/12/17.  Patient presenting from urology office after being found to be profoundly weak with an elevated heart rate and little to no urine output. Patient was most recently discharged [facility] on 04/23/2017 after treatment of nausea vomiting and possible UTI with at least some of his symptoms being attributed to methotrexate. At time of discharge pt states he was feeling fairly well but has gradually become one more week and tired with onset of nausea and virtually no oral intake over the last 24 hours. Patient also states he's had only approximately 6 mL of drainage from his nephrostomy tube on the day of discharge and has not had any since that time. Patient completed his course of ciprofloxacin on 04/27/2017. Patient also complaining of progressive abdominal pain predominantly in the left upper quadrant with radiation to the back. Additionally patient finished a steroid taper on 04/20/2017 for treatment of a Crohn's flare. Patient denied any actual fevers, chest pain, palpitations, shortness of breath, change in ileostomy output, focal neurological deficits, nuchal rigidity, focal neurological deficits. Patient stated that on date admission he was too weak to even  get out of bed and had to be assisted by family members to get him to his car and into his urology appointment.  Assessment & Plan    Principal Problem:   Abdominal pain, left upper quadrant likely secondary to nephrostomy dysfunction/hydronephrosis - 141023 mm left UPJ calculus. First noted on 04/10/2017. Left nephrostomy placed on 04/12/2017. No output from drain over the last 4 days. Increasing abdominal and flank discomfort on time of admission. Followed by [facility] who is planning percutaneous stone surgery on 05/14/2017 - urology consulted,patient seen by Dr. Luberta Robertson, recommended exchange of left PCN, percutaneous nephrolithotomy on 9/14. Once exchange, flush nephrostomy tube with 10 mL of normal saline while in house - IR consulted -since spiking fevers this morning, placed on IV Rocephin (allergy to penicillin/hives, patient and wife declined fluoroquinolones), obtain blood cultures, local surgeon and, lactic acid  Active Problems:   Nephrolithiasis - see #1    Crohn's disease of small and large intestines with complication ([place]) - Status post colectomy and ostomy,complete her steroid taper on 8/21, stopped methotrexate - Continue Lomotil, entyvio Outpatient    Physical deconditioning - PTOT evaluation, nutrition consult    Protein calorie malnutrition ([place]), moderate -  nutrition consult, Pre-albumin10.4, , albumin 2.1    GERD (gastroesophageal reflux disease) - Continue Pepcid    Chronic pain - continue oxycodone    Depression - Continue Zoloft  Code Status: full  DVT Prophylaxis:  Heparin  Family Communication: Discussed in detail with the patient, all imaging results, lab results explained to the patient and wife    Disposition Plan:   Time Spent in minutes  35 minutes  Procedures:    Consultants:   urology  Antimicrobials:   IV Rocephin 8/30   Medications  Scheduled Meds: . diphenoxylate-atropine  1 tablet Oral QID  . famotidine  20  mg Oral QHS  . folic acid  1 mg Oral Daily  . heparin  5,000 Units Subcutaneous Q8H  . potassium chloride SA  20 mEq Oral Once per day on Mon Thu  . sertraline  50 mg Oral Daily  . tamsulosin  0.4 mg Oral Daily   Continuous Infusions: . sodium chloride 50 mL/hr at 04/28/17 1245  . cefTRIAXone (ROCEPHIN)  IV Stopped (04/29/17 0954)   PRN Meds:.acetaminophen **OR** acetaminophen, ondansetron **OR** ondansetron (ZOFRAN) IV, oxyCODONE, promethazine   Antibiotics   Anti-infectives    Start     Dose/Rate Route Frequency Ordered Stop   04/29/17 0900  cefTRIAXone (ROCEPHIN) 1 g in dextrose 5 % 50 mL IVPB     1 g 100 mL/hr over 30 Minutes Intravenous Every 24 hours 04/29/17 0755     04/29/17 0800  levofloxacin (LEVAQUIN) IVPB 750 mg  Status:  Discontinued     750 mg 100 mL/hr over 90 Minutes Intravenous Every 48 hours 04/29/17 0755 04/29/17 0756        Subjective:   Sohum Delillo was seen and examined today.  Weakness improving, spiking fevers overnight, 102.6 F this morning. Patient denies dizziness, chest pain, shortness of breath, numbess, tingling. No acute events overnight.    Objective:   Vitals:   04/28/17 1103 04/28/17 2218 04/28/17 2356 04/29/17 0533  BP: 109/68 (!) 88/55 93/60 (!) 97/53  Pulse: 83 83 80 98  Resp: 18 16 18 16   Temp: 98.2 F (36.8 C) (!) 100.9 F (38.3 C) 99.3 F (37.4 C) (!) 102.6 F (39.2 C)  TempSrc: Oral Oral Oral Oral  SpO2: 100% 99% 98% 95%  Weight: 50.8 kg (111 lb 14.4 oz)     Height: 5' 11"  (1.803 m)       Intake/Output Summary (Last 24 hours) at 04/29/17 1034 Last data filed at 04/29/17 0552  Gross per 24 hour  Intake           855.83 ml  Output              250 ml  Net           605.83 ml     Wt Readings from Last 3 Encounters:  04/28/17 50.8 kg (111 lb 14.4 oz)  04/20/17 49.3 kg (108 lb 11 oz)  04/11/17 52.1 kg (114 lb 13.8 oz)     Exam  General: Alert and oriented x 3, NAD  Eyes:   HEENT:  Atraumatic,  normocephalic  Cardiovascular: S1 S2 auscultated, no rubs, murmurs or gallops. Regular rate and rhythm.  Respiratory: Clear to auscultation bilaterally, no wheezing, rales or rhonchi  Gastrointestinal: ileostomy pouch in place, nondistended, nontender  Ext: no pedal edema bilaterally  Neuro: AAOx3, Cr N's II- XII. Strength 5/5 upper and lower extremities bilaterally, speech clear, sensations grossly intact  Musculoskeletal: No digital cyanosis, clubbing  Skin:  No rashes  Psych: Normal affect and demeanor, alert and oriented x3   GU: nephrostomy tube in place on the left, dressing intact   Data Reviewed:  I have personally reviewed following labs and imaging studies  Micro Results Recent Results (from the past 240 hour(s))  Culture, blood (Routine X 2) w Reflex to ID Panel     Status: None   Collection Time: 04/20/17  6:44 PM  Result Value Ref Range Status   Specimen Description BLOOD RIGHT ANTECUBITAL  Final   Special Requests IN PEDIATRIC BOTTLE Blood Culture adequate volume  Final   Culture   Final    NO GROWTH 5 DAYS Performed at [facility], [place] [address], [place], [place] [address]    Report Status 04/25/2017 FINAL  Final  Culture, blood (Routine X 2) w Reflex to ID Panel     Status: None   Collection Time: 04/20/17  6:44 PM  Result Value Ref Range Status   Specimen Description BLOOD LEFT ARM  Final   Special Requests IN PEDIATRIC BOTTLE Blood Culture adequate volume  Final   Culture   Final    NO GROWTH 5 DAYS Performed at [facility], [place] [address] [place], [place] [address]    Report Status 04/25/2017 FINAL  Final  Culture, Urine     Status: None   Collection Time: 04/20/17  8:14 PM  Result Value Ref Range Status   Specimen Description URINE, CLEAN CATCH  Final   Special Requests from nephrostomy tube Immunocompromised  Final   Culture   Final    NO GROWTH Performed at [facility], [place] [address], [place], [place] [address]     Report Status 04/22/2017 FINAL  Final    Radiology Reports Dg Abd 1 View  Result Date: 04/28/2017 CLINICAL DATA:  Increasing abdominal pain. Nephrostomy tube with possible blockage. EXAM: ABDOMEN - 1 VIEW COMPARISON:  None. FINDINGS: Nephrostomy tube tip is loosely coiled over the left flank. No tight kink is seen. Postoperative bowel seen in the pelvis and left abdomen with ostomy seen over the left lower quadrant. No overt signs of bowel obstruction. No concerning mass effect or calcification. Mild atelectatic or scar-like opacity behind the heart. IMPRESSION: 1. Left nephrostomy tube with retention loop coiled over the left flank. No kinking or catheter fracture. 2. Postoperative bowel.  Nonobstructive bowel gas pattern. Electronically Signed   By: Monte Fantasia M.D.   On: 04/28/2017 13:28   Dg Abd 1 View  Result Date: 04/21/2017 CLINICAL DATA:  Sudden onset of nausea and vomiting EXAM: ABDOMEN - 1 VIEW COMPARISON:  Fluoroscopic images from percutaneous nephrostomy exchange. FINDINGS: The bowel gas pattern is normal. No obstruction or free air is identified within the abdomen pelvis. Chain sutures are noted in the left lower quadrant and mid-pelvis. A percutaneous nephrostomy tube projects over the left renal shadow. IMPRESSION: Negative for bowel obstruction or free air. Left-sided nephrostomy tube in place. Electronically Signed   By: Ashley Royalty M.D.   On: 04/21/2017 00:02   US Renal  Result Date: 04/28/2017 CLINICAL DATA:  Abdominal pain for 2 hours. Patient is post recent left nephrostomy catheter exchange. EXAM: RENAL / URINARY TRACT ULTRASOUND COMPLETE COMPARISON:  Radiograph 04/28/2017 FINDINGS: Right Kidney: Length: 11.6 cm. Echogenicity within normal limits. No mass or hydronephrosis visualized. Left Kidney: Length: 11.9 cm. There is a severe hydronephrosis. 1.8 cm linear shadowing structure is seen in the lower pole of the left kidney. Bladder: Not  seen. IMPRESSION: Normal appearance of  the right kidney. Severe left hydronephrosis. 1.8 cm linear shadowing structure seen in the lower pole of the left kidney may represent a shadowing calculus or portion of the nephrostomy catheter. Electronically Signed   By: Fidela Salisbury M.D.   On: 04/28/2017 18:04   Ct Renal Stone Study  Result Date: 04/10/2017 CLINICAL DATA:  LEFT kidney stone planned for surgery on 04/22/2017, having severe pain today, nausea and vomiting, history Crohn's disease, GERD, short gut syndrome EXAM: CT ABDOMEN AND PELVIS WITHOUT CONTRAST TECHNIQUE: Multidetector CT imaging of the abdomen and pelvis was performed following the standard protocol without IV contrast. Sagittal and coronal MPR images reconstructed from axial data set. Oral contrast was not administered for this indication. COMPARISON:  03/05/2017 FINDINGS: Lower chest: Blebs at lung bases bilaterally. Dependent atelectasis LEFT lower lobe. Hepatobiliary: Post cholecystectomy. Intrahepatic and extrahepatic biliary dilatation again identified, CBD 9 mm, 6 mm previously. No definite focal hepatic lesions. Pancreas: Normal appearance Spleen: Normal appearance Adrenals/Urinary Tract: LEFT hydronephrosis secondary to a large 14 x 10 x 23 mm LEFT UPJ calculus. Additional tiny nonobstructing LEFT renal calculi. Mild perinephric and peripelvic edema LEFT kidney. RIGHT kidney unremarkable. Bladder and ureters unremarkable. Stomach/Bowel: Bowel loops suboptimally assessed due to lack of IV and oral contrast. LEFT lower quadrant colostomy. Hartmann pouch. Dilated segment of bowel in the mid and LEFT abdomen unchanged from previous exams question atonic or denervated segment adjacent to anastomosis. Segment of small bowel wall thickening is again identified in the central pelvis unchanged. Vascular/Lymphatic: Aorta normal caliber. Scattered normal sized mesenteric nodes. Reproductive: Prostate gland 4.6 x 3.3 cm. Other: No free air free fluid.  No hernia. Musculoskeletal:  Bones unremarkable. IMPRESSION: New LEFT hydronephrosis secondary to a 14 x 10 x 23 mm diameter LEFT UPJ calculus. Additional tiny nonobstructing LEFT renal calculi. Chronic dilatation of a segment of small bowel adjacent to an anastomosis question atonic or down admitted segment. Chronic bowel wall thickening of a small bowel loop in the central pelvis consistent with history of Crohn's disease. Mild biliary dilatation with CBD appearing larger than was seen on the prior exam, recommend correlation with LFTs. Electronically Signed   By: Lavonia Dana M.D.   On: 04/10/2017 18:22   Ir Nephrostomy Placement Left  Result Date: 04/12/2017 INDICATION: History of left-sided nephrolithiasis. Patient was to undergo planned left-sided percutaneous nephrolithotomy access however was admitted to the hospital this weekend with left-sided flank pain and impending urosepsis and as such, request made for placement of a left-sided nephrostomy catheter. EXAM: 1. ULTRASOUND GUIDANCE FOR PUNCTURE OF THE LEFT RENAL COLLECTING SYSTEM 2. LEFT PERCUTANEOUS NEPHROSTOMY TUBE PLACEMENT. COMPARISON:  CT abdomen and pelvis - 04/10/2017 MEDICATIONS: Ciprofloxacin 400 mg IV;The antibiotic was administered in an appropriate time frame prior to skin puncture. ANESTHESIA/SEDATION: Moderate (conscious) sedation was employed during this procedure. A total of Versed 3 mg and Fentanyl 150 mcg was administered intravenously. Moderate Sedation Time: 26 minutes. The patient's level of consciousness and vital signs were monitored continuously by radiology nursing throughout the procedure under my direct supervision. CONTRAST:  20 mL Isovue 300 administered into the collecting system FLUOROSCOPY TIME:  4 minutes 6 seconds (40 mGy) COMPLICATIONS: None immediate. PROCEDURE: The procedure, risks, benefits, and alternatives were explained to the patient. Questions regarding the procedure were encouraged and answered. The patient understands and consents to  the procedure. A timeout was performed prior to the initiation of the procedure. The left flank region was prepped with Betadine in a  sterile fashion, and a sterile drape was applied covering the operative field. A sterile gown and sterile gloves were used for the procedure. Local anesthesia was provided with 1% Lidocaine with epinephrine. Preprocedural spot radiographic image was obtained of the left flank. Ultrasound was used to localize the left kidney. Under direct ultrasound guidance, a 21 gauge needle was advanced into the renal collecting system. An ultrasound image documentation was performed. Access within the collecting system was confirmed with the efflux of urine followed by contrast injection. Contrast injection demonstrated opacification of the left renal collecting system demonstrating access to the collecting system via the renal pelvis. As such, an opacified posterior inferior calyx within the left kidney was targeted fluoroscopically with an additional 22 gauge needle with special attention made to avoid the gaseous distended descending colon. Access to the collecting system was confirmed with the efflux of a small amount of urine and advancement a Nitrex wire into the left renal pelvis. The tract was dilated with an Accustick stent. Over a guide wire, a 10-French percutaneous nephrostomy catheter was advanced into the collecting system where the coil was formed and locked. Contrast was injected and several sport radiographs were obtained in various obliquities confirming access. The catheter was secured at the skin with a Prolene retention suture and a gravity bag was placed. A dressing was placed. The patient tolerated procedure well without immediate postprocedural complication. FINDINGS: Preprocedural spot fluoroscopic image demonstrates an approximately 1.7 cm opacity overlying the expected location of left renal pelvis compatible with the known left renal pelvic stone. Ultrasound scanning  demonstrates a moderately dilated left renal pelvis without significant caliectasis, though note, sonographic evaluation of the left kidney was markedly degraded secondary to obscuration due to gaseous distension of the descending colon. Initial access to the left renal collecting system was performed via ultrasound guidance however demonstrated puncture of the left renal pelvis. As such, with the collecting system opacified, a posterior inferior calix was targeted fluoroscopically allowing advancement of an 10-French percutaneous nephrostomy catheter under intermittent fluoroscopic guidance. Contrast injection confirmed appropriate positioning. IMPRESSION: Successful ultrasound and fluoroscopic guided placement of a left sided 10 French PCN. This nephrostomy may be utilized during future plantar nephrolithotomy as clinically indicated. Electronically Signed   By: Sandi Mariscal M.D.   On: 04/12/2017 15:31   Ir Nephrostomy Exchange Left  Result Date: 04/12/2017 INDICATION: History of left-sided nephrolithiasis, post left-sided percutaneous nephrostomy catheter placement earlier today though patient inadvertently fracture the external portion of the nephrostomy catheter. As such, patient returns today for fluoroscopic guided nephrostomy catheter exchange. EXAM: FLUOROSCOPIC GUIDED LEFT SIDED NEPHROSTOMY CATHETER EXCHANGE COMPARISON:  Ultrasound and fluoroscopic guided nephrostomy catheter placement - earlier same day; CT abdomen pelvis - 04/10/2017 CONTRAST:  15 mL Isovue-300 administered into the collecting system FLUOROSCOPY TIME:  54 seconds (5.4 mGy) COMPLICATIONS: None immediate. TECHNIQUE: Informed written consent was obtained from the patient after a discussion of the risks, benefits and alternatives to treatment. Questions regarding the procedure were encouraged and answered. A timeout was performed prior to the initiation of the procedure. The left flank and external portion of existing nephrostomy catheter  were prepped and draped in the usual sterile fashion. A sterile drape was applied covering the operative field. Maximum barrier sterile technique with sterile gowns and gloves were used for the procedure. A timeout was performed prior to the initiation of the procedure. A pre procedural spot fluoroscopic image was obtained after contrast was injected via the existing nephrostomy catheter demonstrating appropriate positioning within the renal  pelvis. The existing nephrostomy catheter was cut and cannulated with a Benson wire which was coiled within the renal pelvis. Under intermittent fluoroscopic guidance, the existing nephrostomy catheter was exchanged for a new 10.2 [place] all-purpose drainage catheter. Contrast injection confirmed appropriate positioning within the renal pelvis and a post exchange fluoroscopic image was obtained. The catheter was locked and secured to the skin with a suture. A dressing was placed. The patient tolerated the procedure well without immediate postprocedural complication. FINDINGS: The existing nephrostomy catheter is appropriately positioned and functioning. After successful fluoroscopic guided exchange, the new nephrostomy catheter is coiled and locked within the left renal pelvis. IMPRESSION: Successful fluoroscopic guided exchange of left sided 10.2 French percutaneous nephrostomy catheter. Electronically Signed   By: Sandi Mariscal M.D.   On: 04/12/2017 18:36    Lab Data:  CBC:  Recent Labs Lab 04/22/17 1215 04/23/17 0510 04/24/17 0815 04/28/17 1348 04/29/17 0459  WBC 2.3* 2.7* 2.0* 3.1* 4.2  NEUTROABS  --   --   --  0.6*  --   HGB 9.2* 8.5* 8.9* 10.9* 8.7*  HCT 28.0* 25.1* 27.1* 34.0* 27.8*  MCV 90.3 92.6 92.5 91.9 91.7  PLT 164 132* 99* 259 878   Basic Metabolic Panel:  Recent Labs Lab 04/22/17 1215 04/23/17 0515 04/28/17 1348 04/29/17 0459  NA 138 139 130* 133*  K 3.4* 3.9 4.3 3.7  CL 104 107 97* 101  CO2 28 28 22 26   GLUCOSE 102* 90 144* 100*    BUN 12 8 12 10   CREATININE 0.84 0.81 1.72* 1.80*  CALCIUM 8.2* 8.0* 8.1* 7.6*  MG  --   --  1.2*  --   PHOS  --   --  3.8  --    GFR: Estimated Creatinine Clearance: 32.9 mL/min (A) (by C-G formula based on SCr of 1.8 mg/dL (H)). Liver Function Tests:  Recent Labs Lab 04/28/17 1348  AST 24  ALT 20  ALKPHOS 66  BILITOT 1.2  PROT 5.5*  ALBUMIN 2.1*   No results for input(s): LIPASE, AMYLASE in the last 168 hours. No results for input(s): AMMONIA in the last 168 hours. Coagulation Profile: No results for input(s): INR, PROTIME in the last 168 hours. Cardiac Enzymes: No results for input(s): CKTOTAL, CKMB, CKMBINDEX, TROPONINI in the last 168 hours. BNP (last 3 results) No results for input(s): PROBNP in the last 8760 hours. HbA1C: No results for input(s): HGBA1C in the last 72 hours. CBG: No results for input(s): GLUCAP in the last 168 hours. Lipid Profile: No results for input(s): CHOL, HDL, LDLCALC, TRIG, CHOLHDL, LDLDIRECT in the last 72 hours. Thyroid Function Tests: No results for input(s): TSH, T4TOTAL, FREET4, T3FREE, THYROIDAB in the last 72 hours. Anemia Panel: No results for input(s): VITAMINB12, FOLATE, FERRITIN, TIBC, IRON, RETICCTPCT in the last 72 hours. Urine analysis:    Component Value Date/Time   COLORURINE YELLOW 04/28/2017 1728   APPEARANCEUR CLEAR 04/28/2017 1728   LABSPEC 1.011 04/28/2017 1728   PHURINE 6.0 04/28/2017 1728   GLUCOSEU NEGATIVE 04/28/2017 1728   HGBUR SMALL (A) 04/28/2017 1728   BILIRUBINUR NEGATIVE 04/28/2017 1728   KETONESUR NEGATIVE 04/28/2017 1728   PROTEINUR NEGATIVE 04/28/2017 1728   NITRITE NEGATIVE 04/28/2017 1728   LEUKOCYTESUR NEGATIVE 04/28/2017 1728     Ripudeep Rai M.D. [facility] 04/29/2017, 10:34 AM  Pager: [phone] Between 7am to 7pm - call Pager - [phone]  After 7pm go to [email or web address] - password [email or web address]  Call night coverage person covering after 7pm

## 2017-04-29 NOTE — Progress Notes (Signed)
OT Cancellation Note  Patient Details Name: Gregory [name] MRN: [id number] DOB: 1961-05-18   Cancelled Treatment:    Reason Eval/Treat Not Completed: Patient at procedure or test/ unavailable.  Will continue to follow as able.  Simonne Come 04/29/2017, 2:25 PM

## 2017-04-30 DIAGNOSIS — E43 Unspecified severe protein-calorie malnutrition: Secondary | ICD-10-CM | POA: Insufficient documentation

## 2017-04-30 LAB — CBC
HCT: 27.9 % — ABNORMAL LOW (ref 39.0–52.0)
HEMOGLOBIN: 8.8 g/dL — AB (ref 13.0–17.0)
MCH: 28.9 pg (ref 26.0–34.0)
MCHC: 31.5 g/dL (ref 30.0–36.0)
MCV: 91.5 fL (ref 78.0–100.0)
Platelets: 362 10*3/uL (ref 150–400)
RBC: 3.05 MIL/uL — ABNORMAL LOW (ref 4.22–5.81)
RDW: 17 % — ABNORMAL HIGH (ref 11.5–15.5)
WBC: 8 10*3/uL (ref 4.0–10.5)

## 2017-04-30 LAB — BASIC METABOLIC PANEL
Anion gap: 6 (ref 5–15)
BUN: 8 mg/dL (ref 6–20)
CHLORIDE: 106 mmol/L (ref 101–111)
CO2: 25 mmol/L (ref 22–32)
CREATININE: 1.59 mg/dL — AB (ref 0.61–1.24)
Calcium: 8 mg/dL — ABNORMAL LOW (ref 8.9–10.3)
GFR calc Af Amer: 54 mL/min — ABNORMAL LOW (ref >60)
GFR calc non Af Amer: 47 mL/min — ABNORMAL LOW (ref >60)
GLUCOSE: 115 mg/dL — AB (ref 65–99)
Potassium: 3.7 mmol/L (ref 3.5–5.1)
SODIUM: 137 mmol/L (ref 135–145)

## 2017-04-30 MED ORDER — SODIUM CHLORIDE 0.9 % IV SOLN
INTRAVENOUS | Status: DC
  Administered 2017-04-30 – 2017-05-01 (×4): via INTRAVENOUS

## 2017-04-30 MED ORDER — PRO-STAT SUGAR FREE PO LIQD
30.0000 mL | Freq: Two times a day (BID) | ORAL | Status: DC
  Administered 2017-04-30 – 2017-05-02 (×5): 30 mL via ORAL
  Filled 2017-04-30 (×5): qty 30

## 2017-04-30 MED ORDER — DEXTROSE 5 % IV SOLN
1.0000 g | Freq: Three times a day (TID) | INTRAVENOUS | Status: DC
  Administered 2017-04-30: 1 g via INTRAVENOUS
  Filled 2017-04-30 (×3): qty 1

## 2017-04-30 MED ORDER — ENSURE ENLIVE PO LIQD
237.0000 mL | Freq: Two times a day (BID) | ORAL | Status: DC
  Administered 2017-05-01 – 2017-05-02 (×2): 237 mL via ORAL

## 2017-04-30 MED ORDER — CEFEPIME HCL 1 G IJ SOLR
1.0000 g | Freq: Three times a day (TID) | INTRAMUSCULAR | Status: DC
  Administered 2017-04-30 – 2017-05-02 (×5): 1 g via INTRAVENOUS
  Filled 2017-04-30 (×8): qty 1

## 2017-04-30 MED ORDER — ACETAMINOPHEN 10 MG/ML IV SOLN
1000.0000 mg | Freq: Four times a day (QID) | INTRAVENOUS | Status: AC
  Administered 2017-04-30 – 2017-05-01 (×4): 1000 mg via INTRAVENOUS
  Filled 2017-04-30 (×4): qty 100

## 2017-04-30 MED ORDER — SODIUM CHLORIDE 0.9 % IV BOLUS (SEPSIS)
250.0000 mL | Freq: Once | INTRAVENOUS | Status: AC
  Administered 2017-04-30: 250 mL via INTRAVENOUS

## 2017-04-30 NOTE — Progress Notes (Signed)
Pt has low BP, pain med not given.  Opyd, MD notified.

## 2017-04-30 NOTE — Progress Notes (Signed)
Nutrition Follow-up  DOCUMENTATION CODES:   Severe malnutrition in context of chronic illness, Underweight  INTERVENTION:   -Decrease Ensure Enlive po to BID, each supplement provides 350 kcal and 20 grams of protein -30 ml Prostat BID, each supplement provides 100 kcals and 15 grams protein -Continue MVI daily  NUTRITION DIAGNOSIS:   Malnutrition (Severe) related to chronic illness (crohn's disease, short gut syndrome) as evidenced by severe depletion of body fat, severe depletion of muscle mass.  Ongoing  GOAL:   Patient will meet greater than or equal to 90% of their needs  Progressing  MONITOR:   PO intake, Supplement acceptance, Labs, Weight trends, Skin, I & O's  REASON FOR ASSESSMENT:   Malnutrition Screening Tool, Consult Assessment of nutrition requirement/status  ASSESSMENT:   Gregory [name] is a 56 y.o. male with medical history significant of endocarditis, Crohn's disease s/p colectomy and ileostomy, GERD, and most recently obstructing nephrolithiasis s/p nephrostomy placement on 04/12/17.  Patient presenting from urology office after being found to be profoundly weak with an elevated heart rate and little to no urine output.   8/30- s/p lt percutaneous nephrostomy by IR  Pt very sleepy at time of visit. Per PT, pt just finished treatment session and is in pain.   No intake data available. Noted pt refused Ensure supplement last night.   Limited Nutrition-Focused physical exam completed. Findings are severe fat depletion, severe muscle depletion, and no edema.   Labs reviewed.   Diet Order:  Diet regular Room service appropriate? Yes; Fluid consistency: Thin  Skin:  Reviewed, no issues  Last BM:  04/30/17  Height:   Ht Readings from Last 1 Encounters:  04/28/17 5' 11"  (1.803 m)    Weight:   Wt Readings from Last 1 Encounters:  04/28/17 111 lb 14.4 oz (50.8 kg)    Ideal Body Weight:  78.2 kg  BMI:  Body mass index is 15.61  kg/m.  Estimated Nutritional Needs:   Kcal:  1800-2000  Protein:  100-115 grams  Fluid:  1.8-2.0 L  EDUCATION NEEDS:   Education needs no appropriate at this time  Jameshia Hayashida A. Jimmye Norman, RD, LDN, CDE Pager: [phone] After hours Pager: [phone]

## 2017-04-30 NOTE — Progress Notes (Signed)
[facility]                                                                              Gregory [name] Demographics  Gregory [name], is a 56 y.o. male, DOB - 25-Jul-1961, UUV:[id number]  Admit date - 04/28/2017   Admitting Physician [name], MD  Outpatient Primary MD for the Gregory [name] is Gregory [name], No Pcp Per  Outpatient specialists:   LOS - 2  days   Medical records reviewed and are as summarized below:    No chief complaint on file.      Brief summary   Per admit note by Dr. Barbaraann Faster on 8/29 Gregory [name] is a 56 y.o. male with medical history significant of endocarditis, Crohn's disease s/p colectomy and ileostomy, GERD, and most recently obstructing nephrolithiasis s/p nephrostomy placement on 04/12/17.  Gregory [name] presenting from urology office after being found to be profoundly weak with an elevated heart rate and little to no urine output. Gregory [name] was most recently discharged [facility] on 04/23/2017 after treatment of nausea vomiting and possible UTI with at least some of his symptoms being attributed to methotrexate. At time of discharge pt states he was feeling fairly well but has gradually become one more week and tired with onset of nausea and virtually no oral intake over the last 24 hours. Gregory [name] also states he's had only approximately 6 mL of drainage from his nephrostomy tube on the day of discharge and has not had any since that time. Gregory [name] completed his course of ciprofloxacin on 04/27/2017. Gregory [name] also complaining of progressive abdominal pain predominantly in the left upper quadrant with radiation to the back. Additionally Gregory [name] finished a steroid taper on 04/20/2017 for treatment of a Crohn's flare. Gregory [name] denied any actual fevers, chest pain, palpitations, shortness of breath, change in ileostomy output, focal neurological deficits, nuchal rigidity, focal neurological deficits. Gregory [name] stated that on date admission he was too weak to even  get out of bed and had to be assisted by family members to get him to his car and into his urology appointment.  Assessment & Plan    Principal Problem:   Abdominal pain, left upper quadrant likely secondary to nephrostomy dysfunction/hydronephrosis, left pyelonephritis - 141023 mm left UPJ calculus. First noted on 04/10/2017. Left nephrostomy placed on 04/12/2017. No output from drain over the last 4 days. Increasing abdominal and flank discomfort on time of admission. Followed by [facility] who is planning percutaneous stone surgery on 05/14/2017 - urology consulted,Gregory [name] seen by Dr. Luberta Robertson, recommended exchange of left PCN, percutaneous nephrolithotomy on 9/14. Once exchanged, flush nephrostomy tube with 10 mL of normal saline while in house - left percutaneous nephrostomy tube placed - still spiking fevers, placed on IV cefepime. Urology recommended continuing antibiotics until surgery on 9/14, will place on Levaquin at the time of discharge. - blood cultures 8/30 negative so far  Active Problems:   Nephrolithiasis - see #1    Crohn's disease of small and large intestines with complication ([place]) - Status post colectomy and ostomy,completed steroid taper on 8/21, stopped methotrexate - Continue Lomotil, entyvio Outpatient    Physical deconditioning - PTOT evaluation,  nutrition consult    Protein calorie malnutrition ([place]), moderate - nutrition consulted, Pre-albumin10.4, , albumin 2.1 - BMI 15.6    GERD (gastroesophageal reflux disease) - Continue Pepcid    Chronic pain - continue oxycodone    Depression - Continue Zoloft  Code Status: full  DVT Prophylaxis:  Heparin  Family Communication: Discussed in detail with the Gregory [name], all imaging results, lab results explained to the Gregory [name]    Disposition Plan:   Time Spent in minutes 25 minutes  Procedures:  left percutaneous nephrostomy tube placed  Consultants:   urology  Antimicrobials:   IV  Rocephin 8/30   Medications  Scheduled Meds: . diphenoxylate-atropine  1 tablet Oral QID  . famotidine  20 mg Oral QHS  . feeding supplement (ENSURE ENLIVE)  237 mL Oral BID BM  . feeding supplement (PRO-STAT SUGAR FREE 64)  30 mL Oral BID  . folic acid  1 mg Oral Daily  . heparin  5,000 Units Subcutaneous Q8H  . multivitamin with minerals  1 tablet Oral Daily  . potassium chloride SA  20 mEq Oral Once per day on Mon Thu  . sertraline  50 mg Oral Daily  . sodium chloride flush  5 mL Intravenous Q8H  . tamsulosin  0.4 mg Oral Daily   Continuous Infusions: . sodium chloride 100 mL/hr at 04/30/17 0706  . acetaminophen Stopped (04/30/17 0914)  . ceFEPime (MAXIPIME) IV Stopped (04/30/17 1200)  . ceFEPime (MAXIPIME) IV     PRN Meds:.HYDROcodone-acetaminophen, ondansetron **OR** ondansetron (ZOFRAN) IV, oxyCODONE, promethazine   Antibiotics   Anti-infectives    Start     Dose/Rate Route Frequency Ordered Stop   04/30/17 1400  ceFEPIme (MAXIPIME) 1 g in dextrose 5 % 50 mL IVPB     1 g 100 mL/hr over 30 Minutes Intravenous Every 8 hours 04/30/17 0924     04/30/17 0915  ceFEPIme (MAXIPIME) 1 g in dextrose 5 % 50 mL IVPB     1 g 100 mL/hr over 30 Minutes Intravenous Every 8 hours 04/30/17 0901     04/29/17 0900  cefTRIAXone (ROCEPHIN) 1 g in dextrose 5 % 50 mL IVPB  Status:  Discontinued     1 g 100 mL/hr over 30 Minutes Intravenous Every 24 hours 04/29/17 0755 04/30/17 0900   04/29/17 0800  levofloxacin (LEVAQUIN) IVPB 750 mg  Status:  Discontinued     750 mg 100 mL/hr over 90 Minutes Intravenous Every 48 hours 04/29/17 0755 04/29/17 0756        Subjective:   Romel Dumond was seen and examined today. Still spiking low-grade fevers, 99.65F overnight. Percutaneous nephrostomy tube placed. Gregory [name] denies dizziness, chest pain, shortness of breath, numbess, tingling. No acute events overnight.    Objective:   Vitals:   04/30/17 0100 04/30/17 0111 04/30/17 0529 04/30/17 0709    BP:  (!) 96/58 (!) 88/45 (!) 91/55  Pulse:   69 68  Resp:   16   Temp: 99.1 F (37.3 C)  98.1 F (36.7 C)   TempSrc: Oral  Oral   SpO2:   96%   Weight:      Height:        Intake/Output Summary (Last 24 hours) at 04/30/17 1220 Last data filed at 04/30/17 1000  Gross per 24 hour  Intake          1606.66 ml  Output              350 ml  Net  1256.66 ml     Wt Readings from Last 3 Encounters:  04/28/17 50.8 kg (111 lb 14.4 oz)  04/20/17 49.3 kg (108 lb 11 oz)  04/11/17 52.1 kg (114 lb 13.8 oz)     Exam Physical Exam General: Alert and oriented x 3, NAD Eyes: HEENT:  Cardiovascular: S1 S2 auscultated, Regular rate and rhythm. No pedal edema b/l Respiratory: Clear to auscultation bilaterally, no wheezing, rales or rhonchi Gastrointestinal: Soft, nontender, nondistended, + bowel sounds, nephrostomy tube+ Ext: no pedal edema bilaterally Neuro: no new deficits Musculoskeletal: No digital cyanosis, clubbing Skin: No rashes Psych: Normal affect and demeanor, alert and oriented x3      Data Reviewed:  I have personally reviewed following labs and imaging studies  Micro Results Recent Results (from the past 240 hour(s))  Culture, blood (Routine X 2) w Reflex to ID Panel     Status: None   Collection Time: 04/20/17  6:44 PM  Result Value Ref Range Status   Specimen Description BLOOD RIGHT ANTECUBITAL  Final   Special Requests IN PEDIATRIC BOTTLE Blood Culture adequate volume  Final   Culture   Final    NO GROWTH 5 DAYS Performed at [facility], [address], [place], [place] [address]    Report Status 04/25/2017 FINAL  Final  Culture, blood (Routine X 2) w Reflex to ID Panel     Status: None   Collection Time: 04/20/17  6:44 PM  Result Value Ref Range Status   Specimen Description BLOOD LEFT ARM  Final   Special Requests IN PEDIATRIC BOTTLE Blood Culture adequate volume  Final   Culture   Final    NO GROWTH 5 DAYS Performed at [facility], [place] [address], [place], [place] [address]    Report Status 04/25/2017 FINAL  Final  Culture, Urine     Status: None   Collection Time: 04/20/17  8:14 PM  Result Value Ref Range Status   Specimen Description URINE, CLEAN CATCH  Final   Special Requests from nephrostomy tube Immunocompromised  Final   Culture   Final    NO GROWTH Performed at [facility], [place] [address] [place],  [address]    Report Status 04/22/2017 FINAL  Final  Urine culture     Status: Abnormal   Collection Time: 04/28/17  5:28 PM  Result Value Ref Range Status   Specimen Description URINE, CLEAN CATCH  Final   Special Requests NONE  Final   Culture MULTIPLE SPECIES PRESENT, SUGGEST RECOLLECTION (A)  Final   Report Status 04/29/2017 FINAL  Final    Radiology Reports Dg Abd 1 View  Result Date: 04/28/2017 CLINICAL DATA:  Increasing abdominal pain. Nephrostomy tube with possible blockage. EXAM: ABDOMEN - 1 VIEW COMPARISON:  None. FINDINGS: Nephrostomy tube tip is loosely coiled over the left flank. No tight kink is seen. Postoperative bowel seen in the pelvis and left abdomen with ostomy seen over the left lower quadrant. No overt signs of bowel obstruction. No concerning mass effect or calcification. Mild atelectatic or scar-like opacity behind the heart. IMPRESSION: 1. Left nephrostomy tube with retention loop coiled over the left flank. No kinking or catheter fracture. 2. Postoperative bowel.  Nonobstructive bowel gas pattern. Electronically Signed   By: Monte Fantasia M.D.   On: 04/28/2017 13:28   Dg Abd 1 View  Result Date: 04/21/2017 CLINICAL DATA:  Sudden onset of nausea and vomiting EXAM: ABDOMEN - 1 VIEW COMPARISON:  Fluoroscopic images  from percutaneous nephrostomy exchange. FINDINGS: The bowel gas pattern is normal. No obstruction or free air is identified within the abdomen pelvis. Chain sutures are noted in the left lower quadrant and mid-pelvis. A percutaneous nephrostomy tube projects over  the left renal shadow. IMPRESSION: Negative for bowel obstruction or free air. Left-sided nephrostomy tube in place. Electronically Signed   By: Ashley Royalty M.D.   On: 04/21/2017 00:02   US Renal  Result Date: 04/28/2017 CLINICAL DATA:  Abdominal pain for 2 hours. Gregory [name] is post recent left nephrostomy catheter exchange. EXAM: RENAL / URINARY TRACT ULTRASOUND COMPLETE COMPARISON:  Radiograph 04/28/2017 FINDINGS: Right Kidney: Length: 11.6 cm. Echogenicity within normal limits. No mass or hydronephrosis visualized. Left Kidney: Length: 11.9 cm. There is a severe hydronephrosis. 1.8 cm linear shadowing structure is seen in the lower pole of the left kidney. Bladder: Not seen. IMPRESSION: Normal appearance of the right kidney. Severe left hydronephrosis. 1.8 cm linear shadowing structure seen in the lower pole of the left kidney may represent a shadowing calculus or portion of the nephrostomy catheter. Electronically Signed   By: Fidela Salisbury M.D.   On: 04/28/2017 18:04   Ct Renal Stone Study  Result Date: 04/10/2017 CLINICAL DATA:  LEFT kidney stone planned for surgery on 04/22/2017, having severe pain today, nausea and vomiting, history Crohn's disease, GERD, short gut syndrome EXAM: CT ABDOMEN AND PELVIS WITHOUT CONTRAST TECHNIQUE: Multidetector CT imaging of the abdomen and pelvis was performed following the standard protocol without IV contrast. Sagittal and coronal MPR images reconstructed from axial data set. Oral contrast was not administered for this indication. COMPARISON:  03/05/2017 FINDINGS: Lower chest: Blebs at lung bases bilaterally. Dependent atelectasis LEFT lower lobe. Hepatobiliary: Post cholecystectomy. Intrahepatic and extrahepatic biliary dilatation again identified, CBD 9 mm, 6 mm previously. No definite focal hepatic lesions. Pancreas: Normal appearance Spleen: Normal appearance Adrenals/Urinary Tract: LEFT hydronephrosis secondary to a large 14 x 10 x 23 mm LEFT UPJ calculus.  Additional tiny nonobstructing LEFT renal calculi. Mild perinephric and peripelvic edema LEFT kidney. RIGHT kidney unremarkable. Bladder and ureters unremarkable. Stomach/Bowel: Bowel loops suboptimally assessed due to lack of IV and oral contrast. LEFT lower quadrant colostomy. Hartmann pouch. Dilated segment of bowel in the mid and LEFT abdomen unchanged from previous exams question atonic or denervated segment adjacent to anastomosis. Segment of small bowel wall thickening is again identified in the central pelvis unchanged. Vascular/Lymphatic: Aorta normal caliber. Scattered normal sized mesenteric nodes. Reproductive: Prostate gland 4.6 x 3.3 cm. Other: No free air free fluid.  No hernia. Musculoskeletal: Bones unremarkable. IMPRESSION: New LEFT hydronephrosis secondary to a 14 x 10 x 23 mm diameter LEFT UPJ calculus. Additional tiny nonobstructing LEFT renal calculi. Chronic dilatation of a segment of small bowel adjacent to an anastomosis question atonic or down admitted segment. Chronic bowel wall thickening of a small bowel loop in the central pelvis consistent with history of Crohn's disease. Mild biliary dilatation with CBD appearing larger than was seen on the prior exam, recommend correlation with LFTs. Electronically Signed   By: Lavonia Dana M.D.   On: 04/10/2017 18:22   Ir Nephrostomy Placement Left  Result Date: 04/29/2017 CLINICAL DATA:  Obstructing renal calculus, status post percutaneous nephrostomy catheter placement with most recent exchange 04/12/2017. No output x4 days. Sepsis. EXAM: NEPHROSTOGRAM THROUGH EXISTING CATHETER (ATTEMPTED) LEFT PERCUTANEOUS NEPHROSTOMY CATHETER PLACEMENT UNDER ULTRASOUND AND FLUOROSCOPIC GUIDANCE FLUOROSCOPY TIME:  3.5 minutes, 73 uGym2 DAP seconds TECHNIQUE: The procedure, risks (including but not limited to bleeding, infection,  organ damage ), benefits, and alternatives were explained to the Gregory [name]. Questions regarding the procedure were encouraged and  answered. The Gregory [name] understands and consents to the procedure. Left previous placed catheter andFlank region prepped with Betadine, draped in usual sterile fashion, infiltrated locally with 1% lidocaine. Contrast was injected through the catheter. Extrarenal retroperitoneal positioning of the drain catheter was identified with no communication to the renal collecting system. 1.9 cm UPJ calculus noted. Intravenous Fentanyl and Versed were administered as conscious sedation during continuous monitoring of the Gregory [name]'s level of consciousness and physiological / cardiorespiratory status by the radiology RN, with a total moderate sedation time of 30 minutes. The previous catheter was cut and removed. Skin entry site infiltrated with 1% lidocaine. Under real-time ultrasound guidance, a 21-gauge trocar needle was advanced into a posterior lower pole calyx. Ultrasound image documentation was saved. Urine spontaneously returned through the needle. Needle was exchanged over a guidewire for transitional dilator. Contrast injection confirmed appropriate positioning. Catheter was exchanged over a guidewire for a 10 French pigtail catheter, formed centrally within the left renal collecting system. Contrast injection confirms appropriate positioning and patency. Catheter secured externally with 0 Prolene suture and placed to external drain bag. COMPLICATIONS: COMPLICATIONS none IMPRESSION: 1. Malpositioned extrarenal drain catheter was cut and removed. 2. Technically successful left percutaneous nephrostomy catheter placement. Electronically Signed   By: Lucrezia Europe M.D.   On: 04/29/2017 16:54   Ir Nephrostomy Placement Left  Result Date: 04/12/2017 INDICATION: History of left-sided nephrolithiasis. Gregory [name] was to undergo planned left-sided percutaneous nephrolithotomy access however was admitted to the hospital this weekend with left-sided flank pain and impending urosepsis and as such, request made for placement of a  left-sided nephrostomy catheter. EXAM: 1. ULTRASOUND GUIDANCE FOR PUNCTURE OF THE LEFT RENAL COLLECTING SYSTEM 2. LEFT PERCUTANEOUS NEPHROSTOMY TUBE PLACEMENT. COMPARISON:  CT abdomen and pelvis - 04/10/2017 MEDICATIONS: Ciprofloxacin 400 mg IV;The antibiotic was administered in an appropriate time frame prior to skin puncture. ANESTHESIA/SEDATION: Moderate (conscious) sedation was employed during this procedure. A total of Versed 3 mg and Fentanyl 150 mcg was administered intravenously. Moderate Sedation Time: 26 minutes. The Gregory [name]'s level of consciousness and vital signs were monitored continuously by radiology nursing throughout the procedure under my direct supervision. CONTRAST:  20 mL Isovue 300 administered into the collecting system FLUOROSCOPY TIME:  4 minutes 6 seconds (40 mGy) COMPLICATIONS: None immediate. PROCEDURE: The procedure, risks, benefits, and alternatives were explained to the Gregory [name]. Questions regarding the procedure were encouraged and answered. The Gregory [name] understands and consents to the procedure. A timeout was performed prior to the initiation of the procedure. The left flank region was prepped with Betadine in a sterile fashion, and a sterile drape was applied covering the operative field. A sterile gown and sterile gloves were used for the procedure. Local anesthesia was provided with 1% Lidocaine with epinephrine. Preprocedural spot radiographic image was obtained of the left flank. Ultrasound was used to localize the left kidney. Under direct ultrasound guidance, a 21 gauge needle was advanced into the renal collecting system. An ultrasound image documentation was performed. Access within the collecting system was confirmed with the efflux of urine followed by contrast injection. Contrast injection demonstrated opacification of the left renal collecting system demonstrating access to the collecting system via the renal pelvis. As such, an opacified posterior inferior calyx within  the left kidney was targeted fluoroscopically with an additional 22 gauge needle with special attention made to avoid the gaseous distended descending colon. Access to the collecting system  was confirmed with the efflux of a small amount of urine and advancement a Nitrex wire into the left renal pelvis. The tract was dilated with an Accustick stent. Over a guide wire, a 10-French percutaneous nephrostomy catheter was advanced into the collecting system where the coil was formed and locked. Contrast was injected and several sport radiographs were obtained in various obliquities confirming access. The catheter was secured at the skin with a Prolene retention suture and a gravity bag was placed. A dressing was placed. The Gregory [name] tolerated procedure well without immediate postprocedural complication. FINDINGS: Preprocedural spot fluoroscopic image demonstrates an approximately 1.7 cm opacity overlying the expected location of left renal pelvis compatible with the known left renal pelvic stone. Ultrasound scanning demonstrates a moderately dilated left renal pelvis without significant caliectasis, though note, sonographic evaluation of the left kidney was markedly degraded secondary to obscuration due to gaseous distension of the descending colon. Initial access to the left renal collecting system was performed via ultrasound guidance however demonstrated puncture of the left renal pelvis. As such, with the collecting system opacified, a posterior inferior calix was targeted fluoroscopically allowing advancement of an 10-French percutaneous nephrostomy catheter under intermittent fluoroscopic guidance. Contrast injection confirmed appropriate positioning. IMPRESSION: Successful ultrasound and fluoroscopic guided placement of a left sided 10 French PCN. This nephrostomy may be utilized during future plantar nephrolithotomy as clinically indicated. Electronically Signed   By: Sandi Mariscal M.D.   On: 04/12/2017 15:31   Ir  Nephrostomy Exchange Left  Result Date: 04/12/2017 INDICATION: History of left-sided nephrolithiasis, post left-sided percutaneous nephrostomy catheter placement earlier today though Gregory [name] inadvertently fracture the external portion of the nephrostomy catheter. As such, Gregory [name] returns today for fluoroscopic guided nephrostomy catheter exchange. EXAM: FLUOROSCOPIC GUIDED LEFT SIDED NEPHROSTOMY CATHETER EXCHANGE COMPARISON:  Ultrasound and fluoroscopic guided nephrostomy catheter placement - earlier same day; CT abdomen pelvis - 04/10/2017 CONTRAST:  15 mL Isovue-300 administered into the collecting system FLUOROSCOPY TIME:  54 seconds (5.4 mGy) COMPLICATIONS: None immediate. TECHNIQUE: Informed written consent was obtained from the Gregory [name] after a discussion of the risks, benefits and alternatives to treatment. Questions regarding the procedure were encouraged and answered. A timeout was performed prior to the initiation of the procedure. The left flank and external portion of existing nephrostomy catheter were prepped and draped in the usual sterile fashion. A sterile drape was applied covering the operative field. Maximum barrier sterile technique with sterile gowns and gloves were used for the procedure. A timeout was performed prior to the initiation of the procedure. A pre procedural spot fluoroscopic image was obtained after contrast was injected via the existing nephrostomy catheter demonstrating appropriate positioning within the renal pelvis. The existing nephrostomy catheter was cut and cannulated with a Benson wire which was coiled within the renal pelvis. Under intermittent fluoroscopic guidance, the existing nephrostomy catheter was exchanged for a new 10.2 [place] all-purpose drainage catheter. Contrast injection confirmed appropriate positioning within the renal pelvis and a post exchange fluoroscopic image was obtained. The catheter was locked and secured to the skin with a suture. A dressing was  placed. The Gregory [name] tolerated the procedure well without immediate postprocedural complication. FINDINGS: The existing nephrostomy catheter is appropriately positioned and functioning. After successful fluoroscopic guided exchange, the new nephrostomy catheter is coiled and locked within the left renal pelvis. IMPRESSION: Successful fluoroscopic guided exchange of left sided 10.2 French percutaneous nephrostomy catheter. Electronically Signed   By: Sandi Mariscal M.D.   On: 04/12/2017 18:36    Lab Data:  CBC:  Recent Labs Lab 04/24/17 0815 04/28/17 1348 04/29/17 0459 04/30/17 0408  WBC 2.0* 3.1* 4.2 8.0  NEUTROABS  --  0.6*  --   --   HGB 8.9* 10.9* 8.7* 8.8*  HCT 27.1* 34.0* 27.8* 27.9*  MCV 92.5 91.9 91.7 91.5  PLT 99* 259 296 575   Basic Metabolic Panel:  Recent Labs Lab 04/28/17 1348 04/29/17 0459 04/30/17 0408  NA 130* 133* 137  K 4.3 3.7 3.7  CL 97* 101 106  CO2 22 26 25   GLUCOSE 144* 100* 115*  BUN 12 10 8   CREATININE 1.72* 1.80* 1.59*  CALCIUM 8.1* 7.6* 8.0*  MG 1.2*  --   --   PHOS 3.8  --   --    GFR: Estimated Creatinine Clearance: 37.3 mL/min (A) (by C-G formula based on SCr of 1.59 mg/dL (H)). Liver Function Tests:  Recent Labs Lab 04/28/17 1348  AST 24  ALT 20  ALKPHOS 66  BILITOT 1.2  PROT 5.5*  ALBUMIN 2.1*   No results for input(s): LIPASE, AMYLASE in the last 168 hours. No results for input(s): AMMONIA in the last 168 hours. Coagulation Profile: No results for input(s): INR, PROTIME in the last 168 hours. Cardiac Enzymes: No results for input(s): CKTOTAL, CKMB, CKMBINDEX, TROPONINI in the last 168 hours. BNP (last 3 results) No results for input(s): PROBNP in the last 8760 hours. HbA1C: No results for input(s): HGBA1C in the last 72 hours. CBG: No results for input(s): GLUCAP in the last 168 hours. Lipid Profile: No results for input(s): CHOL, HDL, LDLCALC, TRIG, CHOLHDL, LDLDIRECT in the last 72 hours. Thyroid Function Tests: No  results for input(s): TSH, T4TOTAL, FREET4, T3FREE, THYROIDAB in the last 72 hours. Anemia Panel: No results for input(s): VITAMINB12, FOLATE, FERRITIN, TIBC, IRON, RETICCTPCT in the last 72 hours. Urine analysis:    Component Value Date/Time   COLORURINE YELLOW 04/28/2017 1728   APPEARANCEUR CLEAR 04/28/2017 1728   LABSPEC 1.011 04/28/2017 1728   PHURINE 6.0 04/28/2017 1728   GLUCOSEU NEGATIVE 04/28/2017 1728   HGBUR SMALL (A) 04/28/2017 1728   BILIRUBINUR NEGATIVE 04/28/2017 1728   KETONESUR NEGATIVE 04/28/2017 1728   PROTEINUR NEGATIVE 04/28/2017 1728   NITRITE NEGATIVE 04/28/2017 1728   LEUKOCYTESUR NEGATIVE 04/28/2017 1728     Bryona Foxworthy M.D. [facility] 04/30/2017, 12:20 PM  Pager: [phone] Between 7am to 7pm - call Pager - [phone]  After 7pm go to [email or web address] - password [email or web address]  Call night coverage person covering after 7pm

## 2017-04-30 NOTE — Progress Notes (Signed)
Urology Inpatient Progress Report  Nausea vomiting dehydration   Intv/Subj: Left neph tube exchanged - was outside the kidney Feels some better, still slight nausea and kidney pain Low grade fever last night, BP soft (baseline)  Principal Problem:   Abdominal pain, left upper quadrant Active Problems:   Nephrolithiasis   Nausea & vomiting   Crohn's disease of small and large intestines with complication (HCC)   Anuresis   Physical deconditioning   Protein calorie malnutrition (HCC)   GERD (gastroesophageal reflux disease)   Nephrostomy complication (HCC)   Chronic pain   Depression  Current Facility-Administered Medications  Medication Dose Route Frequency Provider Last Rate Last Dose  . 0.9 %  sodium chloride infusion   Intravenous Continuous Rai, Ripudeep K, MD      . acetaminophen (OFIRMEV) IV 1,000 mg  1,000 mg Intravenous Q6H [name], MD      . acetaminophen (TYLENOL) tablet 650 mg  650 mg Oral Q6H PRN Waldemar Dickens, MD   650 mg at 04/29/17 2340   Or  . acetaminophen (TYLENOL) suppository 650 mg  650 mg Rectal Q6H PRN Waldemar Dickens, MD      . cefTRIAXone (ROCEPHIN) 1 g in dextrose 5 % 50 mL IVPB  1 g Intravenous Q24H Rai, Vernelle Emerald, MD   Stopped at 04/29/17 [phone]  . diphenoxylate-atropine (LOMOTIL) 2.5-0.025 MG per tablet 1 tablet  1 tablet Oral QID Waldemar Dickens, MD   1 tablet at 04/29/17 2215  . famotidine (PEPCID) tablet 20 mg  20 mg Oral QHS Waldemar Dickens, MD   20 mg at 04/29/17 2215  . feeding supplement (ENSURE ENLIVE) (ENSURE ENLIVE) liquid 237 mL  237 mL Oral TID BM Rai, Ripudeep K, MD      . folic acid (FOLVITE) tablet 1 mg  1 mg Oral Daily Waldemar Dickens, MD   1 mg at 04/29/17 0925  . heparin injection 5,000 Units  5,000 Units Subcutaneous Q8H Waldemar Dickens, MD   5,000 Units at 04/30/17 [phone]  . HYDROcodone-acetaminophen (NORCO/VICODIN) 5-325 MG per tablet 1-2 tablet  1-2 tablet Oral Q4H PRN Arne Cleveland, MD   1 tablet at 04/29/17  2211  . multivitamin with minerals tablet 1 tablet  1 tablet Oral Daily Rai, Ripudeep K, MD   1 tablet at 04/29/17 2215  . ondansetron (ZOFRAN) tablet 4 mg  4 mg Oral Q6H PRN Waldemar Dickens, MD       Or  . ondansetron [facility]) injection 4 mg  4 mg Intravenous Q6H PRN Waldemar Dickens, MD   4 mg at 04/29/17 1837  . oxyCODONE (Oxy IR/ROXICODONE) immediate release tablet 5-10 mg  5-10 mg Oral Q4H PRN Waldemar Dickens, MD   10 mg at 04/30/17 0111  . potassium chloride SA (K-DUR,KLOR-CON) CR tablet 20 mEq  20 mEq Oral Once per day on Mon Thu Merrell, David J, MD   20 mEq at 04/29/17 1326  . promethazine (PHENERGAN) injection 12.5 mg  12.5 mg Intravenous Q6H PRN Waldemar Dickens, MD   12.5 mg at 04/29/17 1205  . sertraline (ZOLOFT) tablet 50 mg  50 mg Oral Daily Waldemar Dickens, MD   50 mg at 04/29/17 9166  . sodium chloride flush (NS) 0.9 % injection 5 mL  5 mL Intravenous Q8H Arne Cleveland, MD   5 mL at 04/30/17 0000  . tamsulosin (FLOMAX) capsule 0.4 mg  0.4 mg Oral Daily Waldemar Dickens, MD   0.4 mg  at 04/29/17 0925     Objective: Vital: Vitals:   04/29/17 2337 04/30/17 0100 04/30/17 0111 04/30/17 0529  BP:   (!) 96/58 (!) 88/45  Pulse:    69  Resp:    16  Temp: (!) 102.9 F (39.4 C) 99.1 F (37.3 C)  98.1 F (36.7 C)  TempSrc: Oral Oral  Oral  SpO2:    96%  Weight:      Height:       I/Os: I/O last 3 completed shifts: In: 0737 [I.V.:1750; Other:20; IV Piggyback:50] Out: 600 [Urine:600]  Physical Exam:  General: Patient is in no apparent distress Lungs: Normal respiratory effort, chest expands symmetrically. GI:  The abdomen is soft and nontender without mass. Nephrostomy tube is draining straw colored urine Ext: lower extremities symmetric  Lab Results:  Recent Labs  04/28/17 1348 04/29/17 0459 04/30/17 0408  WBC 3.1* 4.2 8.0  HGB 10.9* 8.7* 8.8*  HCT 34.0* 27.8* 27.9*    Recent Labs  04/28/17 1348 04/29/17 0459 04/30/17 0408  NA 130* 133* 137  K 4.3  3.7 3.7  CL 97* 101 106  CO2 22 26 25   GLUCOSE 144* 100* 115*  BUN 12 10 8   CREATININE 1.72* 1.80* 1.59*  CALCIUM 8.1* 7.6* 8.0*   No results for input(s): LABPT, INR in the last 72 hours. No results for input(s): LABURIN in the last 72 hours. Results for orders placed or performed during the hospital encounter of 04/28/17  Urine culture     Status: Abnormal   Collection Time: 04/28/17  5:28 PM  Result Value Ref Range Status   Specimen Description URINE, CLEAN CATCH  Final   Special Requests NONE  Final   Culture MULTIPLE SPECIES PRESENT, SUGGEST RECOLLECTION (A)  Final   Report Status 04/29/2017 FINAL  Final    Studies/Results: Dg Abd 1 View  Result Date: 04/28/2017 CLINICAL DATA:  Increasing abdominal pain. Nephrostomy tube with possible blockage. EXAM: ABDOMEN - 1 VIEW COMPARISON:  None. FINDINGS: Nephrostomy tube tip is loosely coiled over the left flank. No tight kink is seen. Postoperative bowel seen in the pelvis and left abdomen with ostomy seen over the left lower quadrant. No overt signs of bowel obstruction. No concerning mass effect or calcification. Mild atelectatic or scar-like opacity behind the heart. IMPRESSION: 1. Left nephrostomy tube with retention loop coiled over the left flank. No kinking or catheter fracture. 2. Postoperative bowel.  Nonobstructive bowel gas pattern. Electronically Signed   By: Monte Fantasia M.D.   On: 04/28/2017 13:28   US Renal  Result Date: 04/28/2017 CLINICAL DATA:  Abdominal pain for 2 hours. Patient is post recent left nephrostomy catheter exchange. EXAM: RENAL / URINARY TRACT ULTRASOUND COMPLETE COMPARISON:  Radiograph 04/28/2017 FINDINGS: Right Kidney: Length: 11.6 cm. Echogenicity within normal limits. No mass or hydronephrosis visualized. Left Kidney: Length: 11.9 cm. There is a severe hydronephrosis. 1.8 cm linear shadowing structure is seen in the lower pole of the left kidney. Bladder: Not seen. IMPRESSION: Normal appearance of the  right kidney. Severe left hydronephrosis. 1.8 cm linear shadowing structure seen in the lower pole of the left kidney may represent a shadowing calculus or portion of the nephrostomy catheter. Electronically Signed   By: Fidela Salisbury M.D.   On: 04/28/2017 18:04   Ir Nephrostomy Placement Left  Result Date: 04/29/2017 CLINICAL DATA:  Obstructing renal calculus, status post percutaneous nephrostomy catheter placement with most recent exchange 04/12/2017. No output x4 days. Sepsis. EXAM: NEPHROSTOGRAM THROUGH EXISTING CATHETER (ATTEMPTED)  LEFT PERCUTANEOUS NEPHROSTOMY CATHETER PLACEMENT UNDER ULTRASOUND AND FLUOROSCOPIC GUIDANCE FLUOROSCOPY TIME:  3.5 minutes, 34 uGym2 DAP seconds TECHNIQUE: The procedure, risks (including but not limited to bleeding, infection, organ damage ), benefits, and alternatives were explained to the patient. Questions regarding the procedure were encouraged and answered. The patient understands and consents to the procedure. Left previous placed catheter andFlank region prepped with Betadine, draped in usual sterile fashion, infiltrated locally with 1% lidocaine. Contrast was injected through the catheter. Extrarenal retroperitoneal positioning of the drain catheter was identified with no communication to the renal collecting system. 1.9 cm UPJ calculus noted. Intravenous Fentanyl and Versed were administered as conscious sedation during continuous monitoring of the patient's level of consciousness and physiological / cardiorespiratory status by the radiology RN, with a total moderate sedation time of 30 minutes. The previous catheter was cut and removed. Skin entry site infiltrated with 1% lidocaine. Under real-time ultrasound guidance, a 21-gauge trocar needle was advanced into a posterior lower pole calyx. Ultrasound image documentation was saved. Urine spontaneously returned through the needle. Needle was exchanged over a guidewire for transitional dilator. Contrast injection  confirmed appropriate positioning. Catheter was exchanged over a guidewire for a 10 French pigtail catheter, formed centrally within the left renal collecting system. Contrast injection confirms appropriate positioning and patency. Catheter secured externally with 0 Prolene suture and placed to external drain bag. COMPLICATIONS: COMPLICATIONS none IMPRESSION: 1. Malpositioned extrarenal drain catheter was cut and removed. 2. Technically successful left percutaneous nephrostomy catheter placement. Electronically Signed   By: Lucrezia Europe M.D.   On: 04/29/2017 16:54    Assessment: Left pyelonephritis from displaced neph tube, improved with repositioned neph tube, should be kept on abx until surgery on 9/14. 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  This is improving, but he should remain on LR or NS until discharge. ARF, pre-renal - improving with hydration Pain from pyelonephritis/baseline crohn's - ordered IV tylenol while in house since this works for him and won't cause issues from BP prospective.  Crohn's - needs to get his infusion that was set-up early this week which he missed.  I encouraged him to get this on Monday.   Louis Meckel, MD Urology 04/30/2017, 7:01 AM

## 2017-04-30 NOTE — Evaluation (Signed)
Physical Therapy Evaluation Patient Details Name: Gregory [name] MRN: [id number] DOB: [birth year] Today's Date: 04/30/2017   History of Present Illness  pt is a 56 y/o male with pmh significant for endocarditis, Crohn's ds s/p colectomy and ileostomy, and most recently obstructing nephrolithiasis s/p nephrostomy placement on 8/13, admitted with profound weakness, elevated HT, little to no urine output and progressive abdominal pain.  He is s/p neph tube exchange, the previous tube found outside the kidney.  Clinical Impression  Pt admitted with/for the above problems, managed by a nephrostomy tube exchange.  Pt does continue to be significantly weak in need of guard to min assist.  Pt currently limited functionally due to the problems listed below.  (see problems list.)  Pt will benefit from PT to maximize function and safety to be able to get home safely with available assist.     Follow Up Recommendations Home health PT;Other (comment) (pt has refused HHPT)    Equipment Recommendations  None recommended by PT    Recommendations for Other Services       Precautions / Restrictions Precautions Precautions: Fall Restrictions Weight Bearing Restrictions: No      Mobility  Bed Mobility Overal bed mobility: Needs Assistance Bed Mobility: Supine to Sit;Sit to Supine     Supine to sit: Supervision Sit to supine: Supervision   General bed mobility comments: slow and deliberate due to pain.  Transfers Overall transfer level: Needs assistance   Transfers: Sit to/from Stand Sit to Stand: Supervision         General transfer comment: slow due to pain  Ambulation/Gait Ambulation/Gait assistance: Min guard Ambulation Distance (Feet): 100 Feet Assistive device: None (vs Iv pole) Gait Pattern/deviations: Step-through pattern Gait velocity: slow Gait velocity interpretation: Below normal speed for age/gender General Gait Details: guarded at best, slow and became nauseated and  more painful with increased time up.  Stairs            Wheelchair Mobility    Modified Rankin (Stroke Patients Only)       Balance Overall balance assessment: Needs assistance Sitting-balance support: Single extremity supported;No upper extremity supported Sitting balance-Leahy Scale: Good     Standing balance support: No upper extremity supported;Single extremity supported Standing balance-Leahy Scale: Fair                               Pertinent Vitals/Pain      Home Living Family/patient expects to be discharged to:: Private residence Living Arrangements: Other relatives (sister) Available Help at Discharge: Family;Available 24 hours/day Type of Home: House Home Access: Stairs to enter Entrance Stairs-Rails: Psychiatric nurse of Steps: 3-4 Home Layout: One level Home Equipment: Cane - single point      Prior Function Level of Independence: Independent         Comments: usually Independent with all ADL's, drives, runs short errands, but stays at home alot.  Pt states his sister stays on him about eating and mobility.     Hand Dominance   Dominant Hand: Right    Extremity/Trunk Assessment   Upper Extremity Assessment Upper Extremity Assessment: Overall WFL for tasks assessed    Lower Extremity Assessment Lower Extremity Assessment: Generalized weakness (not well tested due to MMT hurt the L flank)       Communication   Communication: No difficulties  Cognition Arousal/Alertness: Awake/alert Behavior During Therapy: WFL for tasks assessed/performed Overall Cognitive Status: Within Functional Limits for tasks assessed  General Comments General comments (skin integrity, edema, etc.): sats 98% and EHR 95 bpm    Exercises     Assessment/Plan    PT Assessment Patient needs continued PT services  PT Problem List Decreased strength;Decreased activity  tolerance;Decreased balance;Decreased mobility;Pain;Decreased knowledge of use of DME       PT Treatment Interventions Gait training;Functional mobility training;Therapeutic activities;Balance training;Patient/family education;DME instruction    PT Goals (Current goals can be found in the Care Plan section)  Acute Rehab PT Goals Patient Stated Goal: GO HOME PT Goal Formulation: With patient Time For Goal Achievement: 05/07/17 Potential to Achieve Goals: Good    Frequency Min 3X/week   Barriers to discharge        Co-evaluation               AM-PAC PT "6 Clicks" Daily Activity  Outcome Measure Difficulty turning over in bed (including adjusting bedclothes, sheets and blankets)?: A Lot Difficulty moving from lying on back to sitting on the side of the bed? : A Little Difficulty sitting down on and standing up from a chair with arms (e.g., wheelchair, bedside commode, etc,.)?: A Little Help needed moving to and from a bed to chair (including a wheelchair)?: A Little Help needed walking in hospital room?: A Little Help needed climbing 3-5 steps with a railing? : A Little 6 Click Score: 17    End of Session   Activity Tolerance: Patient tolerated treatment well Patient left: in bed;with call bell/phone within reach;with bed alarm set Nurse Communication: Mobility status PT Visit Diagnosis: Unsteadiness on feet (R26.81);Muscle weakness (generalized) (M62.81);Pain Pain - Right/Left: Left Pain - part of body:  (flank)    Time: 8590-9311 PT Time Calculation (min) (ACUTE ONLY): 24 min   Charges:   PT Evaluation $PT Eval Moderate Complexity: 1 Mod PT Treatments $Gait Training: 8-22 mins   PT G Codes:        05-09-2017  Donnella Sham, PT [phone]  (pager)  [name] Hillman Attig May 09, 2017, 1:02 PM

## 2017-04-30 NOTE — Evaluation (Signed)
Occupational Therapy Evaluation Patient Details Name: Gregory [name] MRN: [id number] DOB: 23-Aug-1961 Today's Date: 04/30/2017    History of Present Illness pt is a 56 y/o male with pmh significant for endocarditis, Crohn's ds s/p colectomy and ileostomy, and most recently obstructing nephrolithiasis s/p nephrostomy placement on 8/13.  w/p neph tube exchange, the previous tube found outside the kidney.   Clinical Impression   PATIENT WAS SEEN FOR SKILLED OT ASSESSMENT. PATIENT IS AT BASELINE FOR ADLS. PATIENT IS ABLE TO PERFORM COLOSTOMY CARE I. PATIENT IS AN AGREEMENT THAT HE DOES NOT NEED FURTHER OT AT THIS TIME.     Follow Up Recommendations       Equipment Recommendations       Recommendations for Other Services       Precautions / Restrictions Precautions Precautions: Fall Restrictions Weight Bearing Restrictions: No      Mobility Bed Mobility Overal bed mobility: Needs Assistance Bed Mobility: Supine to Sit;Sit to Supine     Supine to sit: Supervision Sit to supine: Supervision   General bed mobility comments: slow and deliberate due to pain.  Transfers Overall transfer level: Needs assistance   Transfers: Sit to/from Stand Sit to Stand: Supervision         General transfer comment: slow due to pain    Balance                                           ADL either performed or assessed with clinical judgement   ADL Overall ADL's : At baseline                                             Vision Baseline Vision/History: Wears glasses Wears Glasses: At all times Patient Visual Report: No change from baseline       Perception     Praxis      Pertinent Vitals/Pain       Hand Dominance Right   Extremity/Trunk Assessment Upper Extremity Assessment Upper Extremity Assessment: Overall WFL for tasks assessed   Lower Extremity Assessment Lower Extremity Assessment: Generalized weakness (not well tested due  to MMT hurt the L flank)       Communication Communication Communication: No difficulties   Cognition Arousal/Alertness: Awake/alert Behavior During Therapy: WFL for tasks assessed/performed Overall Cognitive Status: Within Functional Limits for tasks assessed                                     General Comments       Exercises     Shoulder Instructions      Home Living Family/patient expects to be discharged to:: Private residence Living Arrangements: Other relatives (sister) Available Help at Discharge: Family;Available 24 hours/day Type of Home: House Home Access: Stairs to enter [employer] of Steps: 3-4 Entrance Stairs-Rails: Right;Left Home Layout: One level     Bathroom Shower/Tub: Teacher, early years/pre: Standard     Home Equipment: Cane - single point          Prior Functioning/Environment Level of Independence: Independent        Comments: usually Independent with all ADL's, drives, runs short errands, but stays at home alot.  Pt states his sister stays on him about eating and mobility.        OT Problem List:        OT Treatment/Interventions:      OT Goals(Current goals can be found in the care plan section) Acute Rehab OT Goals Patient Stated Goal: GO HOME  OT Frequency:     Barriers to D/C:            Co-evaluation              AM-PAC PT "6 Clicks" Daily Activity     Outcome Measure Help from another person eating meals?: None Help from another person taking care of personal grooming?: None Help from another person toileting, which includes using toliet, bedpan, or urinal?: None Help from another person bathing (including washing, rinsing, drying)?: None Help from another person to put on and taking off regular upper body clothing?: None Help from another person to put on and taking off regular lower body clothing?: None 6 Click Score: 24   End of Session    Activity Tolerance:  Patient tolerated treatment well Patient left: in bed;with call bell/phone within reach;with family/visitor present                   Time: 1128-1200 OT Time Calculation (min): 32 min Charges:  OT General Charges $OT Visit: 1 Visit OT Evaluation $OT Eval Low Complexity: 1 Low OT Treatments $Self Care/Home Management : 8-22 mins G-Codes:     6 CLICKS  Jan Walters 04/30/2017, 12:41 PM

## 2017-05-01 DIAGNOSIS — R109 Unspecified abdominal pain: Secondary | ICD-10-CM

## 2017-05-01 LAB — BASIC METABOLIC PANEL
ANION GAP: 5 (ref 5–15)
BUN: 10 mg/dL (ref 6–20)
CALCIUM: 7.6 mg/dL — AB (ref 8.9–10.3)
CO2: 25 mmol/L (ref 22–32)
CREATININE: 1.19 mg/dL (ref 0.61–1.24)
Chloride: 109 mmol/L (ref 101–111)
Glucose, Bld: 109 mg/dL — ABNORMAL HIGH (ref 65–99)
Potassium: 3.4 mmol/L — ABNORMAL LOW (ref 3.5–5.1)
SODIUM: 139 mmol/L (ref 135–145)

## 2017-05-01 LAB — CBC
HEMATOCRIT: 26.7 % — AB (ref 39.0–52.0)
Hemoglobin: 8.4 g/dL — ABNORMAL LOW (ref 13.0–17.0)
MCH: 28.7 pg (ref 26.0–34.0)
MCHC: 31.5 g/dL (ref 30.0–36.0)
MCV: 91.1 fL (ref 78.0–100.0)
Platelets: 446 10*3/uL — ABNORMAL HIGH (ref 150–400)
RBC: 2.93 MIL/uL — ABNORMAL LOW (ref 4.22–5.81)
RDW: 17.6 % — AB (ref 11.5–15.5)
WBC: 8.8 10*3/uL (ref 4.0–10.5)

## 2017-05-01 LAB — PROCALCITONIN: PROCALCITONIN: 0.31 ng/mL

## 2017-05-01 MED ORDER — MIDODRINE HCL 5 MG PO TABS
10.0000 mg | ORAL_TABLET | Freq: Two times a day (BID) | ORAL | Status: DC
  Administered 2017-05-01 – 2017-05-02 (×2): 10 mg via ORAL
  Filled 2017-05-01 (×2): qty 2

## 2017-05-01 MED ORDER — POTASSIUM CHLORIDE CRYS ER 20 MEQ PO TBCR
40.0000 meq | EXTENDED_RELEASE_TABLET | Freq: Once | ORAL | Status: AC
  Administered 2017-05-01: 40 meq via ORAL
  Filled 2017-05-01: qty 2

## 2017-05-01 NOTE — Progress Notes (Signed)
[facility]                                                                              Patient Demographics  Gregory [name], is a 56 y.o. male, DOB - 12/31/1960, TDD:[id number]  Admit date - 04/28/2017   Admitting Physician [name], MD  Outpatient Primary MD for the patient is Patient, No Pcp Per  Outpatient specialists:   LOS - 3  days   Medical records reviewed and are as summarized below:    No chief complaint on file.      Brief summary   Per admit note by Dr. Barbaraann Faster on 8/29 Gregory [name] is a 56 y.o. male with medical history significant of endocarditis, Crohn's disease s/p colectomy and ileostomy, GERD, and most recently obstructing nephrolithiasis s/p nephrostomy placement on 04/12/17.  Patient presenting from urology office after being found to be profoundly weak with an elevated heart rate and little to no urine output. Patient was most recently discharged [facility] hospital on 04/23/2017 after treatment of nausea vomiting and possible UTI with at least some of his symptoms being attributed to methotrexate. At time of discharge pt states he was feeling fairly well but has gradually become one more week and tired with onset of nausea and virtually no oral intake over the last 24 hours. Patient also states he's had only approximately 6 mL of drainage from his nephrostomy tube on the day of discharge and has not had any since that time. Patient completed his course of ciprofloxacin on 04/27/2017. Patient also complaining of progressive abdominal pain predominantly in the left upper quadrant with radiation to the back. Additionally patient finished a steroid taper on 04/20/2017 for treatment of a Crohn's flare. Patient denied any actual fevers, chest pain, palpitations, shortness of breath, change in ileostomy output, focal neurological deficits, nuchal rigidity, focal neurological deficits. Patient stated that on date admission he was too weak to even  get out of bed and had to be assisted by family members to get him to his car and into his urology appointment.  Assessment & Plan    Principal Problem:   Abdominal pain, left upper quadrant likely secondary to nephrostomy dysfunction/hydronephrosis, left pyelonephritis - 141023 mm left UPJ calculus. First noted on 04/10/2017. Left nephrostomy placed on 04/12/2017. No output from drain over the last 4 days. Increasing abdominal and flank discomfort on time of admission. Followed by [facility] who is planning percutaneous stone surgery on 05/14/2017 - urology consulted, patient seen by Dr. Luberta Robertson, recommended exchange of left PCN, percutaneous nephrolithotomy on 9/14. Left percutaneous nephrostomy tube placed -no fevers, continue IV cefepime, will discharge on oral ciprofloxacin until urology surgery on 9/14 -  blood cultures 8/30 negative so far  Active Problems:   Nephrolithiasis - see #1    Crohn's disease of small and large intestines with complication ([place]) - Status post colectomy and ostomy,completed steroid taper on 8/21, stopped methotrexate - Continue Lomotil, entyvio Outpatient    Physical deconditioning - PTOT evaluation, nutrition consult    Protein calorie malnutrition ([place]), moderate - nutrition consulted, Pre-albumin10.4, , albumin 2.1 - BMI 15.6  GERD (gastroesophageal reflux disease) - Continue Pepcid    Chronic pain -Continue oxycodone as needed for pain   Depression - continue Zoloft  Code Status: full  DVT Prophylaxis:  Heparin  Family Communication: Discussed in detail with the patient, all imaging results, lab results explained to the patient.    Disposition Plan: currently doing well, no leukocytosis or fevers. Patient states that he does not want to leave the hospital until his urology surgery done on 9/14. Explained to the patient that this is not possible, once he is stable from medical standpoint, likely tomorrow, he will DC and return  back for his surgery  Time Spent in minutes 25 minutes  Procedures:  left percutaneous nephrostomy tube placed  Consultants:   urology  Antimicrobials:   IV Rocephin 8/30>8/31  IV cefepime 8/31   Medications  Scheduled Meds: . diphenoxylate-atropine  1 tablet Oral QID  . famotidine  20 mg Oral QHS  . feeding supplement (ENSURE ENLIVE)  237 mL Oral BID BM  . feeding supplement (PRO-STAT SUGAR FREE 64)  30 mL Oral BID  . folic acid  1 mg Oral Daily  . heparin  5,000 Units Subcutaneous Q8H  . midodrine  10 mg Oral BID WC  . multivitamin with minerals  1 tablet Oral Daily  . potassium chloride SA  20 mEq Oral Once per day on Mon Thu  . potassium chloride  40 mEq Oral Once  . sertraline  50 mg Oral Daily  . sodium chloride flush  5 mL Intravenous Q8H  . tamsulosin  0.4 mg Oral Daily   Continuous Infusions: . ceFEPime (MAXIPIME) IV Stopped (05/01/17 0905)   PRN Meds:.HYDROcodone-acetaminophen, ondansetron **OR** ondansetron (ZOFRAN) IV, oxyCODONE, promethazine   Antibiotics   Anti-infectives    Start     Dose/Rate Route Frequency Ordered Stop   04/30/17 1400  ceFEPIme (MAXIPIME) 1 g in dextrose 5 % 50 mL IVPB  Status:  Discontinued     1 g 100 mL/hr over 30 Minutes Intravenous Every 8 hours 04/30/17 0924 05/01/17 0005   04/30/17 0915  ceFEPIme (MAXIPIME) 1 g in dextrose 5 % 50 mL IVPB     1 g 100 mL/hr over 30 Minutes Intravenous Every 8 hours 04/30/17 0901     04/29/17 0900  cefTRIAXone (ROCEPHIN) 1 g in dextrose 5 % 50 mL IVPB  Status:  Discontinued     1 g 100 mL/hr over 30 Minutes Intravenous Every 24 hours 04/29/17 0755 04/30/17 0900   04/29/17 0800  levofloxacin (LEVAQUIN) IVPB 750 mg  Status:  Discontinued     750 mg 100 mL/hr over 90 Minutes Intravenous Every 48 hours 04/29/17 0755 04/29/17 0756        Subjective:   Gregory [name] was seen and examined today. No fevers, states he still feels slightly nauseous, tolerating diet. Percutaneous nephrostomy  tube placed. Patient denies dizziness, chest pain, shortness of breath, numbess, tingling. No acute events overnight.  No fevers.   Objective:   Vitals:   04/30/17 0709 04/30/17 1505 04/30/17 2203 05/01/17 0605  BP: (!) 91/55 (!) 98/59 99/60 (!) 85/52  Pulse: 68 63 68 67  Resp:  16 18 18   Temp:  98.1 F (36.7 C) 98.2 F (36.8 C) 97.9 F (36.6 C)  TempSrc:  Oral Oral Oral  SpO2:  98% 97% 97%  Weight:      Height:        Intake/Output Summary (Last 24 hours) at 05/01/17 1034 Last data filed at 05/01/17  8119  Gross per 24 hour  Intake          2648.33 ml  Output              120 ml  Net          2528.33 ml     Wt Readings from Last 3 Encounters:  04/28/17 50.8 kg (111 lb 14.4 oz)  04/20/17 49.3 kg (108 lb 11 oz)  04/11/17 52.1 kg (114 lb 13.8 oz)     Exam  General: Alert and oriented x 3, NAD  Eyes:   HEENT:    Cardiovascular: S1 S2 auscultated, RRR. No pedal edema b/l  Respiratory: CTAB, no wheezing, rales or rhonchi  Gastrointestinal: Soft, nontender, nondistended, + bowel sounds, nephrostomy tube +  Ext: no pedal edema bilaterally  Neuro: no neuro deficits  Musculoskeletal: No digital cyanosis, clubbing  Skin: No rashes  Psych: Normal affect and demeanor, alert and oriented x3    Data Reviewed:  I have personally reviewed following labs and imaging studies  Micro Results Recent Results (from the past 240 hour(s))  Urine culture     Status: Abnormal   Collection Time: 04/28/17  5:28 PM  Result Value Ref Range Status   Specimen Description URINE, CLEAN CATCH  Final   Special Requests NONE  Final   Culture MULTIPLE SPECIES PRESENT, SUGGEST RECOLLECTION (A)  Final   Report Status 04/29/2017 FINAL  Final  Culture, blood (Routine X 2) w Reflex to ID Panel     Status: None (Preliminary result)   Collection Time: 04/29/17  8:03 AM  Result Value Ref Range Status   Specimen Description BLOOD LEFT HAND  Final   Special Requests   Final    BOTTLES DRAWN  AEROBIC AND ANAEROBIC Blood Culture results may not be optimal due to an excessive volume of blood received in culture bottles   Culture NO GROWTH 2 DAYS  Final   Report Status PENDING  Incomplete  Culture, blood (Routine X 2) w Reflex to ID Panel     Status: None (Preliminary result)   Collection Time: 04/29/17  8:03 AM  Result Value Ref Range Status   Specimen Description BLOOD RIGHT ANTECUBITAL  Final   Special Requests   Final    BOTTLES DRAWN AEROBIC AND ANAEROBIC Blood Culture adequate volume   Culture NO GROWTH 2 DAYS  Final   Report Status PENDING  Incomplete    Radiology Reports Dg Abd 1 View  Result Date: 04/28/2017 CLINICAL DATA:  Increasing abdominal pain. Nephrostomy tube with possible blockage. EXAM: ABDOMEN - 1 VIEW COMPARISON:  None. FINDINGS: Nephrostomy tube tip is loosely coiled over the left flank. No tight kink is seen. Postoperative bowel seen in the pelvis and left abdomen with ostomy seen over the left lower quadrant. No overt signs of bowel obstruction. No concerning mass effect or calcification. Mild atelectatic or scar-like opacity behind the heart. IMPRESSION: 1. Left nephrostomy tube with retention loop coiled over the left flank. No kinking or catheter fracture. 2. Postoperative bowel.  Nonobstructive bowel gas pattern. Electronically Signed   By: Monte Fantasia M.D.   On: 04/28/2017 13:28   Dg Abd 1 View  Result Date: 04/21/2017 CLINICAL DATA:  Sudden onset of nausea and vomiting EXAM: ABDOMEN - 1 VIEW COMPARISON:  Fluoroscopic images from percutaneous nephrostomy exchange. FINDINGS: The bowel gas pattern is normal. No obstruction or free air is identified within the abdomen pelvis. Chain sutures are noted in the left lower quadrant and  mid-pelvis. A percutaneous nephrostomy tube projects over the left renal shadow. IMPRESSION: Negative for bowel obstruction or free air. Left-sided nephrostomy tube in place. Electronically Signed   By: Ashley Royalty M.D.   On:  04/21/2017 00:02   US Renal  Result Date: 04/28/2017 CLINICAL DATA:  Abdominal pain for 2 hours. Patient is post recent left nephrostomy catheter exchange. EXAM: RENAL / URINARY TRACT ULTRASOUND COMPLETE COMPARISON:  Radiograph 04/28/2017 FINDINGS: Right Kidney: Length: 11.6 cm. Echogenicity within normal limits. No mass or hydronephrosis visualized. Left Kidney: Length: 11.9 cm. There is a severe hydronephrosis. 1.8 cm linear shadowing structure is seen in the lower pole of the left kidney. Bladder: Not seen. IMPRESSION: Normal appearance of the right kidney. Severe left hydronephrosis. 1.8 cm linear shadowing structure seen in the lower pole of the left kidney may represent a shadowing calculus or portion of the nephrostomy catheter. Electronically Signed   By: Fidela Salisbury M.D.   On: 04/28/2017 18:04   Ct Renal Stone Study  Result Date: 04/10/2017 CLINICAL DATA:  LEFT kidney stone planned for surgery on 04/22/2017, having severe pain today, nausea and vomiting, history Crohn's disease, GERD, short gut syndrome EXAM: CT ABDOMEN AND PELVIS WITHOUT CONTRAST TECHNIQUE: Multidetector CT imaging of the abdomen and pelvis was performed following the standard protocol without IV contrast. Sagittal and coronal MPR images reconstructed from axial data set. Oral contrast was not administered for this indication. COMPARISON:  03/05/2017 FINDINGS: Lower chest: Blebs at lung bases bilaterally. Dependent atelectasis LEFT lower lobe. Hepatobiliary: Post cholecystectomy. Intrahepatic and extrahepatic biliary dilatation again identified, CBD 9 mm, 6 mm previously. No definite focal hepatic lesions. Pancreas: Normal appearance Spleen: Normal appearance Adrenals/Urinary Tract: LEFT hydronephrosis secondary to a large 14 x 10 x 23 mm LEFT UPJ calculus. Additional tiny nonobstructing LEFT renal calculi. Mild perinephric and peripelvic edema LEFT kidney. RIGHT kidney unremarkable. Bladder and ureters unremarkable.  Stomach/Bowel: Bowel loops suboptimally assessed due to lack of IV and oral contrast. LEFT lower quadrant colostomy. Hartmann pouch. Dilated segment of bowel in the mid and LEFT abdomen unchanged from previous exams question atonic or denervated segment adjacent to anastomosis. Segment of small bowel wall thickening is again identified in the central pelvis unchanged. Vascular/Lymphatic: Aorta normal caliber. Scattered normal sized mesenteric nodes. Reproductive: Prostate gland 4.6 x 3.3 cm. Other: No free air free fluid.  No hernia. Musculoskeletal: Bones unremarkable. IMPRESSION: New LEFT hydronephrosis secondary to a 14 x 10 x 23 mm diameter LEFT UPJ calculus. Additional tiny nonobstructing LEFT renal calculi. Chronic dilatation of a segment of small bowel adjacent to an anastomosis question atonic or down admitted segment. Chronic bowel wall thickening of a small bowel loop in the central pelvis consistent with history of Crohn's disease. Mild biliary dilatation with CBD appearing larger than was seen on the prior exam, recommend correlation with LFTs. Electronically Signed   By: Lavonia Dana M.D.   On: 04/10/2017 18:22   Ir Nephrostomy Placement Left  Result Date: 04/29/2017 CLINICAL DATA:  Obstructing renal calculus, status post percutaneous nephrostomy catheter placement with most recent exchange 04/12/2017. No output x4 days. Sepsis. EXAM: NEPHROSTOGRAM THROUGH EXISTING CATHETER (ATTEMPTED) LEFT PERCUTANEOUS NEPHROSTOMY CATHETER PLACEMENT UNDER ULTRASOUND AND FLUOROSCOPIC GUIDANCE FLUOROSCOPY TIME:  3.5 minutes, 73 uGym2 DAP seconds TECHNIQUE: The procedure, risks (including but not limited to bleeding, infection, organ damage ), benefits, and alternatives were explained to the patient. Questions regarding the procedure were encouraged and answered. The patient understands and consents to the procedure. Left previous placed catheter andFlank  region prepped with Betadine, draped in usual sterile fashion,  infiltrated locally with 1% lidocaine. Contrast was injected through the catheter. Extrarenal retroperitoneal positioning of the drain catheter was identified with no communication to the renal collecting system. 1.9 cm UPJ calculus noted. Intravenous Fentanyl and Versed were administered as conscious sedation during continuous monitoring of the patient's level of consciousness and physiological / cardiorespiratory status by the radiology RN, with a total moderate sedation time of 30 minutes. The previous catheter was cut and removed. Skin entry site infiltrated with 1% lidocaine. Under real-time ultrasound guidance, a 21-gauge trocar needle was advanced into a posterior lower pole calyx. Ultrasound image documentation was saved. Urine spontaneously returned through the needle. Needle was exchanged over a guidewire for transitional dilator. Contrast injection confirmed appropriate positioning. Catheter was exchanged over a guidewire for a 10 French pigtail catheter, formed centrally within the left renal collecting system. Contrast injection confirms appropriate positioning and patency. Catheter secured externally with 0 Prolene suture and placed to external drain bag. COMPLICATIONS: COMPLICATIONS none IMPRESSION: 1. Malpositioned extrarenal drain catheter was cut and removed. 2. Technically successful left percutaneous nephrostomy catheter placement. Electronically Signed   By: Lucrezia Europe M.D.   On: 04/29/2017 16:54   Ir Nephrostomy Placement Left  Result Date: 04/12/2017 INDICATION: History of left-sided nephrolithiasis. Patient was to undergo planned left-sided percutaneous nephrolithotomy access however was admitted to the hospital this weekend with left-sided flank pain and impending urosepsis and as such, request made for placement of a left-sided nephrostomy catheter. EXAM: 1. ULTRASOUND GUIDANCE FOR PUNCTURE OF THE LEFT RENAL COLLECTING SYSTEM 2. LEFT PERCUTANEOUS NEPHROSTOMY TUBE PLACEMENT. COMPARISON:   CT abdomen and pelvis - 04/10/2017 MEDICATIONS: Ciprofloxacin 400 mg IV;The antibiotic was administered in an appropriate time frame prior to skin puncture. ANESTHESIA/SEDATION: Moderate (conscious) sedation was employed during this procedure. A total of Versed 3 mg and Fentanyl 150 mcg was administered intravenously. Moderate Sedation Time: 26 minutes. The patient's level of consciousness and vital signs were monitored continuously by radiology nursing throughout the procedure under my direct supervision. CONTRAST:  20 mL Isovue 300 administered into the collecting system FLUOROSCOPY TIME:  4 minutes 6 seconds (40 mGy) COMPLICATIONS: None immediate. PROCEDURE: The procedure, risks, benefits, and alternatives were explained to the patient. Questions regarding the procedure were encouraged and answered. The patient understands and consents to the procedure. A timeout was performed prior to the initiation of the procedure. The left flank region was prepped with Betadine in a sterile fashion, and a sterile drape was applied covering the operative field. A sterile gown and sterile gloves were used for the procedure. Local anesthesia was provided with 1% Lidocaine with epinephrine. Preprocedural spot radiographic image was obtained of the left flank. Ultrasound was used to localize the left kidney. Under direct ultrasound guidance, a 21 gauge needle was advanced into the renal collecting system. An ultrasound image documentation was performed. Access within the collecting system was confirmed with the efflux of urine followed by contrast injection. Contrast injection demonstrated opacification of the left renal collecting system demonstrating access to the collecting system via the renal pelvis. As such, an opacified posterior inferior calyx within the left kidney was targeted fluoroscopically with an additional 22 gauge needle with special attention made to avoid the gaseous distended descending colon. Access to the  collecting system was confirmed with the efflux of a small amount of urine and advancement a Nitrex wire into the left renal pelvis. The tract was dilated with an Accustick stent. Over a guide  wire, a 10-French percutaneous nephrostomy catheter was advanced into the collecting system where the coil was formed and locked. Contrast was injected and several sport radiographs were obtained in various obliquities confirming access. The catheter was secured at the skin with a Prolene retention suture and a gravity bag was placed. A dressing was placed. The patient tolerated procedure well without immediate postprocedural complication. FINDINGS: Preprocedural spot fluoroscopic image demonstrates an approximately 1.7 cm opacity overlying the expected location of left renal pelvis compatible with the known left renal pelvic stone. Ultrasound scanning demonstrates a moderately dilated left renal pelvis without significant caliectasis, though note, sonographic evaluation of the left kidney was markedly degraded secondary to obscuration due to gaseous distension of the descending colon. Initial access to the left renal collecting system was performed via ultrasound guidance however demonstrated puncture of the left renal pelvis. As such, with the collecting system opacified, a posterior inferior calix was targeted fluoroscopically allowing advancement of an 10-French percutaneous nephrostomy catheter under intermittent fluoroscopic guidance. Contrast injection confirmed appropriate positioning. IMPRESSION: Successful ultrasound and fluoroscopic guided placement of a left sided 10 French PCN. This nephrostomy may be utilized during future plantar nephrolithotomy as clinically indicated. Electronically Signed   By: Sandi Mariscal M.D.   On: 04/12/2017 15:31   Ir Nephrostomy Exchange Left  Result Date: 04/12/2017 INDICATION: History of left-sided nephrolithiasis, post left-sided percutaneous nephrostomy catheter placement earlier  today though patient inadvertently fracture the external portion of the nephrostomy catheter. As such, patient returns today for fluoroscopic guided nephrostomy catheter exchange. EXAM: FLUOROSCOPIC GUIDED LEFT SIDED NEPHROSTOMY CATHETER EXCHANGE COMPARISON:  Ultrasound and fluoroscopic guided nephrostomy catheter placement - earlier same day; CT abdomen pelvis - 04/10/2017 CONTRAST:  15 mL Isovue-300 administered into the collecting system FLUOROSCOPY TIME:  54 seconds (5.4 mGy) COMPLICATIONS: None immediate. TECHNIQUE: Informed written consent was obtained from the patient after a discussion of the risks, benefits and alternatives to treatment. Questions regarding the procedure were encouraged and answered. A timeout was performed prior to the initiation of the procedure. The left flank and external portion of existing nephrostomy catheter were prepped and draped in the usual sterile fashion. A sterile drape was applied covering the operative field. Maximum barrier sterile technique with sterile gowns and gloves were used for the procedure. A timeout was performed prior to the initiation of the procedure. A pre procedural spot fluoroscopic image was obtained after contrast was injected via the existing nephrostomy catheter demonstrating appropriate positioning within the renal pelvis. The existing nephrostomy catheter was cut and cannulated with a Benson wire which was coiled within the renal pelvis. Under intermittent fluoroscopic guidance, the existing nephrostomy catheter was exchanged for a new 10.2 [place] all-purpose drainage catheter. Contrast injection confirmed appropriate positioning within the renal pelvis and a post exchange fluoroscopic image was obtained. The catheter was locked and secured to the skin with a suture. A dressing was placed. The patient tolerated the procedure well without immediate postprocedural complication. FINDINGS: The existing nephrostomy catheter is appropriately positioned and  functioning. After successful fluoroscopic guided exchange, the new nephrostomy catheter is coiled and locked within the left renal pelvis. IMPRESSION: Successful fluoroscopic guided exchange of left sided 10.2 French percutaneous nephrostomy catheter. Electronically Signed   By: Sandi Mariscal M.D.   On: 04/12/2017 18:36    Lab Data:  CBC:  Recent Labs Lab 04/28/17 1348 04/29/17 0459 04/30/17 0408 05/01/17 0523  WBC 3.1* 4.2 8.0 8.8  NEUTROABS 0.6*  --   --   --   HGB  10.9* 8.7* 8.8* 8.4*  HCT 34.0* 27.8* 27.9* 26.7*  MCV 91.9 91.7 91.5 91.1  PLT 259 296 362 323*   Basic Metabolic Panel:  Recent Labs Lab 04/28/17 1348 04/29/17 0459 04/30/17 0408 05/01/17 0523  NA 130* 133* 137 139  K 4.3 3.7 3.7 3.4*  CL 97* 101 106 109  CO2 22 26 25 25   GLUCOSE 144* 100* 115* 109*  BUN 12 10 8 10   CREATININE 1.72* 1.80* 1.59* 1.19  CALCIUM 8.1* 7.6* 8.0* 7.6*  MG 1.2*  --   --   --   PHOS 3.8  --   --   --    GFR: Estimated Creatinine Clearance: 49.8 mL/min (by C-G formula based on SCr of 1.19 mg/dL). Liver Function Tests:  Recent Labs Lab 04/28/17 1348  AST 24  ALT 20  ALKPHOS 66  BILITOT 1.2  PROT 5.5*  ALBUMIN 2.1*   No results for input(s): LIPASE, AMYLASE in the last 168 hours. No results for input(s): AMMONIA in the last 168 hours. Coagulation Profile: No results for input(s): INR, PROTIME in the last 168 hours. Cardiac Enzymes: No results for input(s): CKTOTAL, CKMB, CKMBINDEX, TROPONINI in the last 168 hours. BNP (last 3 results) No results for input(s): PROBNP in the last 8760 hours. HbA1C: No results for input(s): HGBA1C in the last 72 hours. CBG: No results for input(s): GLUCAP in the last 168 hours. Lipid Profile: No results for input(s): CHOL, HDL, LDLCALC, TRIG, CHOLHDL, LDLDIRECT in the last 72 hours. Thyroid Function Tests: No results for input(s): TSH, T4TOTAL, FREET4, T3FREE, THYROIDAB in the last 72 hours. Anemia Panel: No results for input(s):  VITAMINB12, FOLATE, FERRITIN, TIBC, IRON, RETICCTPCT in the last 72 hours. Urine analysis:    Component Value Date/Time   COLORURINE YELLOW 04/28/2017 1728   APPEARANCEUR CLEAR 04/28/2017 1728   LABSPEC 1.011 04/28/2017 1728   PHURINE 6.0 04/28/2017 1728   GLUCOSEU NEGATIVE 04/28/2017 1728   HGBUR SMALL (A) 04/28/2017 1728   BILIRUBINUR NEGATIVE 04/28/2017 1728   KETONESUR NEGATIVE 04/28/2017 1728   PROTEINUR NEGATIVE 04/28/2017 1728   NITRITE NEGATIVE 04/28/2017 1728   LEUKOCYTESUR NEGATIVE 04/28/2017 1728     Gregory [name] M.D. [facility] 05/01/2017, 10:34 AM  Pager: [phone] Between 7am to 7pm - call Pager - [phone]  After 7pm go to [email or web address] - password [email or web address]  Call night coverage person covering after 7pm

## 2017-05-02 LAB — BASIC METABOLIC PANEL
Anion gap: 6 (ref 5–15)
BUN: 10 mg/dL (ref 6–20)
CO2: 24 mmol/L (ref 22–32)
CREATININE: 0.98 mg/dL (ref 0.61–1.24)
Calcium: 8.4 mg/dL — ABNORMAL LOW (ref 8.9–10.3)
Chloride: 108 mmol/L (ref 101–111)
GFR calc Af Amer: >60 mL/min (ref >60)
Glucose, Bld: 92 mg/dL (ref 65–99)
Potassium: 3.7 mmol/L (ref 3.5–5.1)
SODIUM: 138 mmol/L (ref 135–145)

## 2017-05-02 LAB — CBC
HCT: 30.6 % — ABNORMAL LOW (ref 39.0–52.0)
Hemoglobin: 9.7 g/dL — ABNORMAL LOW (ref 13.0–17.0)
MCH: 28.5 pg (ref 26.0–34.0)
MCHC: 31.7 g/dL (ref 30.0–36.0)
MCV: 90 fL (ref 78.0–100.0)
PLATELETS: 609 10*3/uL — AB (ref 150–400)
RBC: 3.4 MIL/uL — ABNORMAL LOW (ref 4.22–5.81)
RDW: 17.6 % — ABNORMAL HIGH (ref 11.5–15.5)
WBC: 13.3 10*3/uL — ABNORMAL HIGH (ref 4.0–10.5)

## 2017-05-02 MED ORDER — ADULT MULTIVITAMIN W/MINERALS CH: 1.0000 | ORAL_TABLET | Freq: Every day | ORAL | 3 refills | Status: AC

## 2017-05-02 MED ORDER — ONDANSETRON 4 MG PO TBDP: 4.0000 mg | ORAL_TABLET | Freq: Three times a day (TID) | ORAL | 0 refills | Status: DC | PRN

## 2017-05-02 MED ORDER — OXYCODONE HCL 5 MG PO TABS: 5.0000 mg | ORAL_TABLET | ORAL | 0 refills | Status: DC | PRN

## 2017-05-02 MED ORDER — SODIUM CHLORIDE 0.9% FLUSH: 5.0000 mL | Freq: Three times a day (TID) | INTRAVENOUS | 1 refills | Status: DC

## 2017-05-02 MED ORDER — MIDODRINE HCL 10 MG PO TABS: 10.0000 mg | ORAL_TABLET | Freq: Two times a day (BID) | ORAL | 3 refills | Status: DC

## 2017-05-02 MED ORDER — SERTRALINE HCL 50 MG PO TABS: 50.0000 mg | ORAL_TABLET | Freq: Every day | ORAL | 1 refills | Status: DC

## 2017-05-02 MED ORDER — CIPROFLOXACIN HCL 500 MG PO TABS: 500.0000 mg | ORAL_TABLET | Freq: Two times a day (BID) | ORAL | 0 refills | Status: DC

## 2017-05-02 MED ORDER — PRO-STAT SUGAR FREE PO LIQD: 30.0000 mL | Freq: Two times a day (BID) | ORAL | 0 refills | Status: DC

## 2017-05-02 MED ORDER — ENSURE ENLIVE PO LIQD: 237.0000 mL | Freq: Two times a day (BID) | ORAL | 12 refills | Status: DC

## 2017-05-02 NOTE — Discharge Summary (Signed)
Physician Discharge Summary   Patient ID: Gregory [name] MRN: [id number] DOB/AGE: [birth year] 56 y.o.  Admit date: 04/28/2017 Discharge date: 05/02/2017  Primary Care Physician:  Patient, No Pcp Per  Discharge Diagnoses:    . Anuresis   Left pyelonephritis  Nephrostomy tube dysfunction  Nephrolithiasis . Crohn's disease of small and large intestines with complication ([place]) . moderate to severe protein calorie malnutrition GERD Chronic pain syndrome Depression  Consults: Urology Interventional radiology  Recommendations for Outpatient Follow-up:  1. Home health PT OT, RN to be arranged by case management 2. Please repeat CBC/BMET at next visit 3. Per IR, Dr Annamaria Boots, no flush the nephrostomy tube needed   DIET: heart healthy diet    Allergies:   Allergies  Allergen Reactions  . Methotrexate Derivatives Other (See Comments)    Anemia, low WBC, severe GI symptoms,   . Humira [Adalimumab] Other (See Comments)    Intolerance  . Penicillins Other (See Comments)    Has patient had a PCN reaction causing immediate rash, facial/tongue/throat swelling, SOB or lightheadedness with hypotension: Unknown Has patient had a PCN reaction causing severe rash involving mucus membranes or skin necrosis: Unknown Has patient had a PCN reaction that required hospitalization: Unknown Has patient had a PCN reaction occurring within the last 10 years: No If all of the above answers are "NO", then may proceed with Cephalosporin use.   . Remicade [Infliximab] Other (See Comments)    Intolerance     DISCHARGE MEDICATIONS: Current Discharge Medication List    START taking these medications   Details  Amino Acids-Protein Hydrolys (FEEDING SUPPLEMENT, PRO-STAT SUGAR FREE 64,) LIQD Take 30 mLs by mouth 2 (two) times daily. Qty: 900 mL, Refills: 0    feeding supplement, ENSURE ENLIVE, (ENSURE ENLIVE) LIQD Take 237 mLs by mouth 2 (two) times daily between meals. Qty: 237 mL, Refills: 12     midodrine (PROAMATINE) 10 MG tablet Take 1 tablet (10 mg total) by mouth 2 (two) times daily with a meal. Qty: 60 tablet, Refills: 3    Multiple Vitamin (MULTIVITAMIN WITH MINERALS) TABS tablet Take 1 tablet by mouth daily. Qty: 30 tablet, Refills: 3    sodium chloride flush (NS) 0.9 % SOLN 5 mLs by Other route every 8 (eight) hours. Qty: 60 Syringe, Refills: 1      CONTINUE these medications which have CHANGED   Details  ciprofloxacin (CIPRO) 500 MG tablet Take 1 tablet (500 mg total) by mouth 2 (two) times daily. X 2 weeks Qty: 28 tablet, Refills: 0    ondansetron (ZOFRAN-ODT) 4 MG disintegrating tablet Take 1 tablet (4 mg total) by mouth every 8 (eight) hours as needed for nausea or vomiting. Qty: 30 tablet, Refills: 0    oxyCODONE (OXY IR/ROXICODONE) 5 MG immediate release tablet Take 1-2 tablets (5-10 mg total) by mouth every 4 (four) hours as needed for severe pain. Qty: 20 tablet, Refills: 0    sertraline (ZOLOFT) 50 MG tablet Take 1 tablet (50 mg total) by mouth daily. Qty: 30 tablet, Refills: 1      CONTINUE these medications which have NOT CHANGED   Details  acetaminophen (TYLENOL) 500 MG tablet Take 2 tablets (1,000 mg total) by mouth every 6 (six) hours. Qty: 30 tablet, Refills: 0    cholecalciferol (VITAMIN D) 400 units TABS tablet Take 400 Units by mouth daily.    diphenoxylate-atropine (LOMOTIL) 2.5-0.025 MG tablet Take 1 tablet by mouth 4 (four) times daily. Qty: 120 tablet, Refills: 3  famotidine (PEPCID) 20 MG tablet Take 1 tablet (20 mg total) by mouth at bedtime. Qty: 90 tablet, Refills: 3    folic acid (FOLVITE) 1 MG tablet Take 1 tablet (1 mg total) by mouth daily. Qty: 30 tablet, Refills: 3    potassium chloride SA (K-DUR,KLOR-CON) 20 MEQ tablet Take 1 tablet (20 mEq total) by mouth as directed. Qty: 90 tablet, Refills: 3    ranitidine (ZANTAC) 150 MG tablet Take 1 tablet (150 mg total) by mouth 2 (two) times daily. Qty: 60 tablet, Refills: 6     tamsulosin (FLOMAX) 0.4 MG CAPS capsule Take 0.4 mg by mouth daily. Refills: 11      STOP taking these medications     predniSONE (DELTASONE) 10 MG tablet          Brief H and P: For complete details please refer to admission H and P, but in brief Per admit note by Dr. Barbaraann Faster on 8/29 Gregory [name] a 56 y.o.malewith medical history significant of endocarditis, Crohn's disease s/p colectomy and ileostomy, GERD, and most recently obstructing nephrolithiasis s/p nephrostomy placement on 04/12/17. Patient presenting from urology office after being found to be profoundly weak with an elevated heart rate and little to no urine output. Patient was most recently discharged [facility] hospital on 04/23/2017 after treatment of nausea vomiting and possible UTI with at least some of his symptoms being attributed to methotrexate. At time of discharge pt states he was feeling fairly well but has gradually become one more week and tired with onset of nausea and virtually no oral intake over the last 24 hours. Patient also states he's had only approximately 6 mL of drainage from his nephrostomy tube on the day of discharge and has not had any since that time. Patient completed his course of ciprofloxacin on 04/27/2017. Patient also complaining of progressive abdominal pain predominantly in the left upper quadrant with radiation to the back. Additionally patient finished a steroid taper on 04/20/2017 for treatment of a Crohn's flare. Patient denied any actual fevers, chest pain, palpitations, shortness of breath, change in ileostomy output, focal neurological deficits, nuchal rigidity, focal neurological deficits. Patient stated that on date admission he was too weak to even get out of bed and had to be assisted by family members to get him to his car and into his urology appointment.  Hospital Course:   Abdominal pain, left upper quadrant likely secondary to nephrostomy dysfunction/hydronephrosis, left  pyelonephritis - 141023 mm left UPJ calculus. First noted on 04/10/2017. Left nephrostomy placed on 04/12/2017. No output from drain over the last 4 days. Increasing abdominal and flank discomfort on time of admission. Followed by [facility] who is planning percutaneous stone surgery on 05/14/2017 - urology consulted, patient seen by Dr. Luberta Robertson, recommended exchange of left PCN, percutaneous nephrolithotomy on 9/14. Left percutaneous nephrostomy tube placed - no fevers, overall improving, patient was continued on IV cefepime, will discharge on oral ciprofloxacin until urology surgery on 9/14 -  blood cultures 8/30 negative so far - discussed with Dr Annamaria Boots, interventional radiology, no need to flush the nephrostomy tube until the etiology surgery on 9/14     Nephrolithiasis - see #1    Crohn's disease of small and large intestines with complication ([place]) - Status post colectomy and ostomy,completed steroid taper on 8/21, stopped methotrexate - Continue Lomotil, entyvio Outpatient    Physical deconditioning - PTOT evaluation, nutrition consulted - home health PT OT, RN arranged by the case management    Protein  calorie malnutrition ([place]), moderate - nutrition consulted, Pre-albumin10.4, , albumin 2.1 - BMI 15.6    GERD (gastroesophageal reflux disease) - Continue Pepcid    Chronic pain -Continue oxycodone as needed for pain, prescription for #20 pills given      Depression - continue Zoloft  Hypotension: baseline, asymptomatic Patient was placed on midodrine  Day of Discharge BP (!) 89/57 (BP Location: Right Arm)   Pulse 75   Temp 98.2 F (36.8 C) (Oral)   Resp 16   Ht 5' 11"  (1.803 m)   Wt 50.8 kg (111 lb 14.4 oz)   SpO2 96%   BMI 15.61 kg/m   Physical Exam: General: Alert and awake oriented x3 not in any acute distress. HEENT: anicteric sclera, pupils reactive to light and accommodation CVS: S1-S2 clear no murmur rubs or gallops Chest: clear to  auscultation bilaterally, no wheezing rales or rhonchi Abdomen: soft nontender, nondistended, normal bowel sounds, PCN + Extremities: no cyanosis, clubbing or edema noted bilaterally Neuro: Cranial nerves II-XII intact, no focal neurological deficits   The results of significant diagnostics from this hospitalization (including imaging, microbiology, ancillary and laboratory) are listed below for reference.    LAB RESULTS: Basic Metabolic Panel:  Recent Labs Lab 04/28/17 1348  05/01/17 0523 05/02/17 0544  NA 130*  < > 139 138  K 4.3  < > 3.4* 3.7  CL 97*  < > 109 108  CO2 22  < > 25 24  GLUCOSE 144*  < > 109* 92  BUN 12  < > 10 10  CREATININE 1.72*  < > 1.19 0.98  CALCIUM 8.1*  < > 7.6* 8.4*  MG 1.2*  --   --   --   PHOS 3.8  --   --   --   < > = values in this interval not displayed. Liver Function Tests:  Recent Labs Lab 04/28/17 1348  AST 24  ALT 20  ALKPHOS 66  BILITOT 1.2  PROT 5.5*  ALBUMIN 2.1*   No results for input(s): LIPASE, AMYLASE in the last 168 hours. No results for input(s): AMMONIA in the last 168 hours. CBC:  Recent Labs Lab 04/28/17 1348  05/01/17 0523 05/02/17 0544  WBC 3.1*  < > 8.8 13.3*  NEUTROABS 0.6*  --   --   --   HGB 10.9*  < > 8.4* 9.7*  HCT 34.0*  < > 26.7* 30.6*  MCV 91.9  < > 91.1 90.0  PLT 259  < > 446* 609*  < > = values in this interval not displayed. Cardiac Enzymes: No results for input(s): CKTOTAL, CKMB, CKMBINDEX, TROPONINI in the last 168 hours. BNP: Invalid input(s): POCBNP CBG: No results for input(s): GLUCAP in the last 168 hours.  Significant Diagnostic Studies:  Dg Abd 1 View  Result Date: 04/28/2017 CLINICAL DATA:  Increasing abdominal pain. Nephrostomy tube with possible blockage. EXAM: ABDOMEN - 1 VIEW COMPARISON:  None. FINDINGS: Nephrostomy tube tip is loosely coiled over the left flank. No tight kink is seen. Postoperative bowel seen in the pelvis and left abdomen with ostomy seen over the left lower  quadrant. No overt signs of bowel obstruction. No concerning mass effect or calcification. Mild atelectatic or scar-like opacity behind the heart. IMPRESSION: 1. Left nephrostomy tube with retention loop coiled over the left flank. No kinking or catheter fracture. 2. Postoperative bowel.  Nonobstructive bowel gas pattern. Electronically Signed   By: Monte Fantasia M.D.   On: 04/28/2017 13:28   US  Renal  Result Date: 04/28/2017 CLINICAL DATA:  Abdominal pain for 2 hours. Patient is post recent left nephrostomy catheter exchange. EXAM: RENAL / URINARY TRACT ULTRASOUND COMPLETE COMPARISON:  Radiograph 04/28/2017 FINDINGS: Right Kidney: Length: 11.6 cm. Echogenicity within normal limits. No mass or hydronephrosis visualized. Left Kidney: Length: 11.9 cm. There is a severe hydronephrosis. 1.8 cm linear shadowing structure is seen in the lower pole of the left kidney. Bladder: Not seen. IMPRESSION: Normal appearance of the right kidney. Severe left hydronephrosis. 1.8 cm linear shadowing structure seen in the lower pole of the left kidney may represent a shadowing calculus or portion of the nephrostomy catheter. Electronically Signed   By: Fidela Salisbury M.D.   On: 04/28/2017 18:04   Ir Nephrostomy Placement Left  Result Date: 04/29/2017 CLINICAL DATA:  Obstructing renal calculus, status post percutaneous nephrostomy catheter placement with most recent exchange 04/12/2017. No output x4 days. Sepsis. EXAM: NEPHROSTOGRAM THROUGH EXISTING CATHETER (ATTEMPTED) LEFT PERCUTANEOUS NEPHROSTOMY CATHETER PLACEMENT UNDER ULTRASOUND AND FLUOROSCOPIC GUIDANCE FLUOROSCOPY TIME:  3.5 minutes, 73 uGym2 DAP seconds TECHNIQUE: The procedure, risks (including but not limited to bleeding, infection, organ damage ), benefits, and alternatives were explained to the patient. Questions regarding the procedure were encouraged and answered. The patient understands and consents to the procedure. Left previous placed catheter andFlank  region prepped with Betadine, draped in usual sterile fashion, infiltrated locally with 1% lidocaine. Contrast was injected through the catheter. Extrarenal retroperitoneal positioning of the drain catheter was identified with no communication to the renal collecting system. 1.9 cm UPJ calculus noted. Intravenous Fentanyl and Versed were administered as conscious sedation during continuous monitoring of the patient's level of consciousness and physiological / cardiorespiratory status by the radiology RN, with a total moderate sedation time of 30 minutes. The previous catheter was cut and removed. Skin entry site infiltrated with 1% lidocaine. Under real-time ultrasound guidance, a 21-gauge trocar needle was advanced into a posterior lower pole calyx. Ultrasound image documentation was saved. Urine spontaneously returned through the needle. Needle was exchanged over a guidewire for transitional dilator. Contrast injection confirmed appropriate positioning. Catheter was exchanged over a guidewire for a 10 French pigtail catheter, formed centrally within the left renal collecting system. Contrast injection confirms appropriate positioning and patency. Catheter secured externally with 0 Prolene suture and placed to external drain bag. COMPLICATIONS: COMPLICATIONS none IMPRESSION: 1. Malpositioned extrarenal drain catheter was cut and removed. 2. Technically successful left percutaneous nephrostomy catheter placement. Electronically Signed   By: Lucrezia Europe M.D.   On: 04/29/2017 16:54    2D ECHO:   Disposition and Follow-up: Discharge Instructions    Diet - low sodium heart healthy    Complete by:  As directed    Discharge instructions    Complete by:  As directed    Per Interventional Radiology No flushes needed outpatient until surgery  Please call [phone] (IR drain Clinic) if you have any issues with the nephrostomy tube, worsening pain, fevers.   Please continue ciprofloxacin twice a day till  surgery. Stay hydrated, drink ensure, prostat daily   Increase activity slowly    Complete by:  As directed        DISPOSITION: home    [facility]. Go on 05/11/2017.   Why:   post hospital follow up scheduled for 05/11/2017  at [place] information:     [phone]       [name], MD. Call on 05/04/2017.  Specialty:  Urology Why:  for hospital follow-up appt  Contact information: [place] [id number] [phone]            Time spent on Discharge: 28mns   Signed:   REstill CottaM.D. [facility] 05/02/2017, 11:44 AM Pager: [phone]

## 2017-05-02 NOTE — Progress Notes (Signed)
[name] Gregory [name] to be D/C'd Home per MD order.  Discussed with the patient and all questions fully answered.  VSS, Skin clean, dry and intact without evidence of skin break down, no evidence of skin tears noted. IV catheter discontinued intact. Site without signs and symptoms of complications. Dressing and pressure applied.  An After Visit Summary was printed and given to the patient. Patient received prescription.  D/c education completed with patient/family including follow up instructions, medication list, d/c activities limitations if indicated, with other d/c instructions as indicated by MD - patient able to verbalize understanding, all questions fully answered.   Patient instructed to return to ED, call 911, or call MD for any changes in condition.   Patient escorted via [place], and D/C home via private auto.  [place] 05/02/2017 12:51 PM

## 2017-05-02 NOTE — Care Management Note (Signed)
Case Management Note  Patient Details  Name: Gregory [name] MRN: [id number] Date of Birth: Jan 20, 1961  Subjective/Objective:   Left pyelonephritis, Nephrostomy tube dysfunction                Action/Plan: Discharge Planning: NCM spoke to pt and states the tubes do not need to flushed. He is declining HH RN, PT and OT at this time. States his sister and friend are at home to assist with care. NCM explained function and importance of having [place] RN. Pt declined. NCM made Unit RN aware.    Expected Discharge Date:  05/02/17               Expected Discharge Plan:  [place]  In-House Referral:  NA  Discharge planning Services  CM Consult  Post Acute Care Choice:  Home Health Choice offered to:  Patient  DME Arranged:  N/A DME Agency:  NA  HH Arranged:  Patient Refused [place] Agency:  NA  Status of Service:  Completed, signed off  If discussed at [place] of Stay Meetings, dates discussed:    Additional Comments:  Erenest Rasher, RN 05/02/2017, 12:11 PM

## 2017-05-02 NOTE — Discharge Instructions (Signed)
Percutaneous Nephrostomy Percutaneous nephrostomy is a procedure to insert a flexible tube into your kidney so that urine can leave your body. This procedure may be done if a medical condition prevents urine from leaving your kidney in the usual way. Urine is normally carried from the kidneys to the bladder through narrow tubes called ureters. A ureter can become blocked because of conditions such as kidney stones, tumors, infection, or blood clots. The nephrostomy tube will be inserted through your back. After the procedure, the tube will remain in place, and urine will drain from the kidney into a drainage bag outside your body. Draining the urine will relieve pressure and help prevent infection that could damage the kidney. Often, this procedure allows your health care provider to identify the cause of the blockage and plan appropriate treatment. Tell a health care provider about:  Any allergies you have.  All medicines you are taking, including vitamins, herbs, eye drops, creams, and over-the-counter medicines.  Any problems you or family members have had with anesthetic medicines.  Any blood disorders you have.  Any surgeries you have had.  Any medical conditions you have.  Whether you are pregnant or may be pregnant. What are the risks? Generally, this is a safe procedure. However, problems may occur, including:  Infection.  Bleeding.  Allergic reactions to medicines or dyes used in the procedure.  Damage to other structures or organs.  What happens before the procedure?  Follow instructions from your health care provider about eating or drinking restrictions.  Ask your health care provider about: ? Changing or stopping your regular medicines. This is especially important if you are taking diabetes medicines or blood thinners. ? Taking medicines such as aspirin and ibuprofen. These medicines can thin your blood. Do not take these medicines before your procedure if your health  care provider instructs you not to.  You may be given antibiotic medicine to help prevent infection.  You may have blood tests to see how well your kidneys and liver are working and to see how well your blood can clot.  Plan to have someone take you home from the hospital or clinic. What happens during the procedure?  To reduce your risk of infection: ? Your health care team will wash or sanitize their hands. ? Your skin will be washed with soap.  An IV tube will be inserted into one of your veins. You may be given medicines through this IV tube to help prevent nausea and pain.  You will be positioned on your abdomen.  You will be given one or more of the following: ? A medicine to help you relax (sedative). ? A medicine to numb the area (local anesthetic) where the nephrostomy tube will be inserted.  The nephrostomy tube, which is thin and flexible, will be inserted into a needle.  The needle will be inserted into your body and guided to your kidney. An imaging method that uses X-ray images (fluoroscopy) will be used to help guide the needle to the kidney.  A dye will be injected through the nephrostomy tube. Then, X-ray images that highlight your kidney will be taken.  Next, the needle will be removed, but the nephrostomy tube will be left in your kidney. The tube may be secured to your skin with stitches (sutures).  A drainage bag will be attached to the nephrostomy tube. Urine will be able to drain from your kidney to this drainage bag outside your body. The procedure may vary among health care providers  and hospitals. What happens after the procedure?  Your blood pressure, heart rate, breathing rate, and blood oxygen level will be monitored until the medicines you were given have worn off.  You will need to remain lying down for several hours.  You will be taught how to care for the nephrostomy tube and the drainage bag.  Donot drive for 24 hours if you were given a  sedative. This information is not intended to replace advice given to you by your health care provider. Make sure you discuss any questions you have with your health care provider. Document Released: 06/07/2013 Document Revised: 05/29/2016 Document Reviewed: 05/29/2016 Elsevier Interactive Patient Education  [employer].

## 2017-05-04 DIAGNOSIS — K5090 Crohn's disease, unspecified, without complications: Secondary | ICD-10-CM | POA: Diagnosis not present

## 2017-05-04 LAB — CULTURE, BLOOD (ROUTINE X 2)
CULTURE: NO GROWTH
Culture: NO GROWTH
SPECIAL REQUESTS: ADEQUATE

## 2017-05-11 ENCOUNTER — Encounter: Payer: Self-pay | Admitting: Family Medicine

## 2017-05-11 ENCOUNTER — Ambulatory Visit (INDEPENDENT_AMBULATORY_CARE_PROVIDER_SITE_OTHER): Payer: Medicare Other | Admitting: Family Medicine

## 2017-05-11 VITALS — BP 98/72 | HR 99 | Temp 98.5°F | Ht 69.25 in | Wt 112.1 lb

## 2017-05-11 DIAGNOSIS — Z7689 Persons encountering health services in other specified circumstances: Secondary | ICD-10-CM

## 2017-05-11 DIAGNOSIS — N200 Calculus of kidney: Secondary | ICD-10-CM

## 2017-05-11 DIAGNOSIS — Z23 Encounter for immunization: Secondary | ICD-10-CM | POA: Diagnosis not present

## 2017-05-11 DIAGNOSIS — F32A Depression, unspecified: Secondary | ICD-10-CM

## 2017-05-11 DIAGNOSIS — K50118 Crohn's disease of large intestine with other complication: Secondary | ICD-10-CM | POA: Diagnosis not present

## 2017-05-11 DIAGNOSIS — F329 Major depressive disorder, single episode, unspecified: Secondary | ICD-10-CM

## 2017-05-11 DIAGNOSIS — E43 Unspecified severe protein-calorie malnutrition: Secondary | ICD-10-CM | POA: Diagnosis not present

## 2017-05-11 NOTE — Progress Notes (Signed)
Patient presents to clinic today to establish care and discuss chronic issues.  Pt is accompanied by his sister, Hilda Blades.  SUBJECTIVE: PMH:  Pt is a 56 yo CM with pmh sig for Crohn's dz, s/p iliostomy, renal calculi, severe protein calorie malnutrition, chronic pain, depression.  Pt states he never really had a PCP, but was followed by GI in [place].  Crohn's Dz: -chronic, with complications -currently on invinteo?   -Rimacaid failed.   Had a colostomy, then an ileostomy 2/2 abscess and other issues in 1980 -Followed by GI, Dr. Enis Gash -Has issues with frequent diarrhea leading to dehydration.   Renal Calculi -Followed by Urology, Dr. Louis Meckel -2cm stone in L kidney -Nephrostomy tube in place.  Surgery scheduled for Sept 14th for stone removal -recent hospitalization 8/29 for infection/tube dysfunction  Depression -Per pt sometimes he gets "snappy" at people. -currently concerned about getting kidney stone taken care of. -No SI/HI -states talks to his sister, who is his best friend. -Open to counseling.  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  Makes pt feel sick. -Has been eating protein cakes with some success.  Allergies: -Methotrexate: anemia, decreased WBCs and RBCs -Humira: rashes, sores, did not help crohns  PSurgHx: -colostomy then ileostomy in 1980s -Surgeries for hepatic abscess and bowel abscesses. 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  He moved to [place] and got married, but that ended in divorce.  Pt states he will never go back to [place].  He moved to [place] to be closer to his sister.  Pt was in [place] yrs ago.  Pt no longer uses tobacco, he does not drink, and does not use drugs.  Pt smoked 1/2-1ppd  x 10 yrs.  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  He  states he could see the worm floating around in his eye.  Every now and then he has floaters in his vision.   Health Maintenance: Dental -- edentulous. Vision -- wears glasses   FMHx: Mom: alive, but pt and sis do not communicate with her.  Heart dz.-stents Dad: desc.at 69 Heart dz. Sis: Hilda Blades) melanoma skin cancer, lymphedema in L leg s/p procedure Brother (stephen): desc Colon Cancer MGF: Etohism PGM: DM, heart issues    Past Medical History:  Diagnosis Date  . Acute endocarditis   . Crohn's disease ([place])   . Depression   . Eating disorder   . GERD (gastroesophageal reflux disease)   . Short gut syndrome     Past Surgical History:  Procedure Laterality Date  . ILEOSTOMY    . IR NEPHROSTOMY EXCHANGE LEFT  04/12/2017  . IR NEPHROSTOMY PLACEMENT LEFT  04/12/2017  . IR NEPHROSTOMY PLACEMENT LEFT  04/29/2017  . SMALL INTESTINE SURGERY    . SUBTOTAL COLECTOMY      Current Outpatient Prescriptions on File Prior to Visit  Medication Sig Dispense Refill  . acetaminophen (TYLENOL) 500 MG tablet Take 2 tablets (1,000 mg total) by mouth every 6 (six) hours. (Patient taking differently: Take 1,000 mg by mouth every 6 (six) hours as needed for moderate pain or headache. ) 30 tablet 0  . Amino Acids-Protein Hydrolys (FEEDING SUPPLEMENT, PRO-STAT SUGAR FREE 64,) LIQD Take 30 mLs by mouth 2 (two) times daily. 900 mL 0  . cholecalciferol (VITAMIN D) 400 units TABS tablet Take 400 Units  by mouth daily.    . ciprofloxacin (CIPRO) 500 MG tablet Take 1 tablet (500 mg total) by mouth 2 (two) times daily. X 2 weeks 28 tablet 0  . diphenoxylate-atropine (LOMOTIL) 2.5-0.025 MG tablet Take 1 tablet by mouth 4 (four) times daily. 120 tablet 3  . famotidine (PEPCID) 20 MG tablet Take 1 tablet (20 mg total) by mouth at bedtime. 90 tablet 3  . folic acid (FOLVITE) 1 MG tablet Take 1 tablet (1 mg total) by mouth daily. 30 tablet 3  . midodrine (PROAMATINE) 10 MG tablet Take 1 tablet (10 mg total) by  mouth 2 (two) times daily with a meal. 60 tablet 3  . Multiple Vitamin (MULTIVITAMIN WITH MINERALS) TABS tablet Take 1 tablet by mouth daily. 30 tablet 3  . ondansetron (ZOFRAN-ODT) 4 MG disintegrating tablet Take 1 tablet (4 mg total) by mouth every 8 (eight) hours as needed for nausea or vomiting. 30 tablet 0  . potassium chloride SA (K-DUR,KLOR-CON) 20 MEQ tablet Take 1 tablet (20 mEq total) by mouth as directed. (Patient taking differently: Take 20 mEq by mouth See admin instructions. Take 59mq by mouth daily on Mondays and Thursdays) 90 tablet 3  . ranitidine (ZANTAC) 150 MG tablet Take 1 tablet (150 mg total) by mouth 2 (two) times daily. 60 tablet 6  . sertraline (ZOLOFT) 50 MG tablet Take 1 tablet (50 mg total) by mouth daily. 30 tablet 1  . sodium chloride flush (NS) 0.9 % SOLN 5 mLs by Other route every 8 (eight) hours. 60 Syringe 1  . tamsulosin (FLOMAX) 0.4 MG CAPS capsule Take 0.4 mg by mouth daily.  11   No current facility-administered medications on file prior to visit.     Allergies  Allergen Reactions  . Methotrexate Derivatives Other (See Comments)    Anemia, low WBC, severe GI symptoms,   . Humira [Adalimumab] Other (See Comments)    Intolerance  . Penicillins Other (See Comments)    Has patient had a PCN reaction causing immediate rash, facial/tongue/throat swelling, SOB or lightheadedness with hypotension: Unknown Has patient had a PCN reaction causing severe rash involving mucus membranes or skin necrosis: Unknown Has patient had a PCN reaction that required hospitalization: Unknown Has patient had a PCN reaction occurring within the last 10 years: No If all of the above answers are "NO", then may proceed with Cephalosporin use.   . Remicade [Infliximab] Other (See Comments)    Intolerance    Family History  Problem Relation Age of Onset  . Heart disease Mother   . Diabetes Father   . Heart disease Father   . Heart attack Father   . Melanoma Sister   .  Crohn's disease Brother   . Inflammatory bowel disease Brother   . Diabetes Paternal Grandmother     Social History   Social History  . Marital status: Divorced    Spouse name: N/A  . Number of children: N/A  . Years of education: N/A   Occupational History  . Retired    Social History Main Topics  . Smoking status: Former Smoker    Packs/day: 1.00    Years: 20.00    Types: Cigarettes    Quit date: 08/31/2006  . Smokeless tobacco: Never Used  . Alcohol use No  . Drug use: No  . Sexual activity: Not on file   Other Topics Concern  . Not on file   Social History Narrative  . No narrative on file    ROS  General: Denies fever, chills, night sweats, changes in appetite   +Changes in wt HEENT: Denies headaches, ear pain, changes in vision, rhinorrhea, sore throat CV: Denies CP, palpitations, SOB, orthopnea Pulm: Denies SOB, cough, wheezing GI: Denies abdominal pain, nausea, vomiting, constipation   +diarrhea, abdominal pain 2/2 crohn's dz GU: Denies dysuria, hematuria, frequency, vaginal discharge Msk: Denies muscle cramps, joint pains Neuro: Denies weakness, numbness, tingling Skin: Denies rashes, bruising Psych: Denies anxiety, hallucinations  +depression   BP 98/72 (BP Location: Right Arm, Patient Position: Sitting)   Pulse 99   Temp 98.5 F (36.9 C) (Oral)   Ht 5' 9.25" (1.759 m)   Wt 112 lb 1.6 oz (50.8 kg)   SpO2 95%   BMI 16.43 kg/m   Physical Exam Gen. Pleasant, malnourished, frail, in NAD.  Wearing glasses. HEENT - [place]/AT, PERRL, no scleral icterus, dried nasal drainage, edentulous, pharynx without erythema or exudate. Neck: No JVD, no thyromegaly Lungs: no accessory muscle use, CTAB, no wheezes, rales or rhonchi Cardiovascular: RRR, No r/g/m, no peripheral edema Abdomen: BS present, soft, nontender,nondistended, no hepatosplenomegaly, iliostomy present- clean, dry.  L percutaneous nephrostomy tube in place, dressings clean, dry, partially coming off.   Nephrostomy bag with pale yellow urine. Musculoskeletal: No deformities, moves all four extremities, no cyanosis or clubbing, normal tone Neuro:  A&Ox3, CN II-XII intact, normal gait Skin:  Warm, dry, intact, well healed midline surgical scar on abdomen Psych: normal affect, mood slightly depressed   Assessment/Plan: Crohn's disease of colon with other complication ([place]) -continue current meds.   -Follow up with GI  Renal calculi -renal tube redressed. -has procedure Sept 14th. -Monitor for fever/signs of infection  Depression, unspecified depression type -currently stable -Given handout on counseling.  Need for influenza vaccination - Plan: Flu Vaccine QUAD 6+ mos PF IM (Fluarix Quad PF)  Protein-calorie malnutrition, severe -Discussed ways to increase protein -Will try to find a good nutritionist for pt  Encounter to establish care with new doctor -records relase   F/u prn

## 2017-05-11 NOTE — Patient Instructions (Addendum)
Malnutrition Malnutrition is any condition in which nutrition is poor. There are many forms of malnutrition. A common form is having too little of one kind of nutrient (nutritional deficiency). Nutrients include proteins, minerals, carbohydrates, fats, and vitamins. They provide the body with energy and keep the body working normally. Malnutrition ranges from mild to severe. The condition affects the body's defense system (immune system). Because of this, people who are malnourished are more likely to develop health problems and get sick. What are the causes? Causes of malnutrition include:  Eating an unbalanced diet.  Eating too much of certain foods.  Eating too little.  Conditions that decrease the body's ability to use nutrients.  What increases the risk? Risk factors include:  Pregnancy and lactation. Women who are pregnant may become malnourished if they do not increase their nutrient intake. They are also susceptible to folic acid deficiency.  Increasing age. The body's ability to absorb nutrients decreases with age. This can contribute to iron, calcium, and vitamin D deficiencies.  Alcohol or drug dependency. Addiction often leads to a lifestyle in which proper nourishment is ignored. Dependency can also hurt the metabolism and the body's ability to absorb nutrients. Alcoholism is a major cause of thiamine deficiency and can lead to deficiencies of magnesium, zinc, and other vitamins.  Eating disorders, such as anorexia nervosa. People with these disorders may eat too little or too much.  Chewing or swallowing problems. People with these disorders may not eat enough.  Certain diseases, including: ? Long-lasting (chronic) diseases. Chronic diseases tend to affect the absorption of calcium, iron, and vitamins B12, A, D, E, and K. ? Liver disease. Liver disease affects the storage of vitamins A and B12. It also interferes with the metabolism of protein and energy sources. ? Kidney  disease. Kidney disease may cause deficiencies of protein, iron, and vitamin D. ? Cancer or AIDS. These diseases can cause a loss of appetite. ? Cystic fibrosis. This disease can make it difficult for the body to absorb nutrients.  Certain diets, including. ? The vegetarian diet. Vegetarians are at risk for iron deficiency. ? The vegan diet. Vegans are susceptible to vitamin B12, calcium, iron, vitamin D, and zinc deficiencies. ? The fruitarian diet. This diet can be deficient in protein, sodium, and many micronutrients. ? Many commercial "fad" diets, including those that claim to enhance well-being and reduce weight. ? Very low calorie diets.  Low income. People with a low income may have trouble paying for nutritious foods.  What are the signs or symptoms? Signs and symptoms depend on the kind of malnutrition you have. Common symptoms include:  Fatigue.  Weakness.  Dizziness.  Fainting  Weight loss.  Poor immune response.  Lack of menstruation.  Hair loss.  Poor memory.  How is this diagnosed? Malnutrition may be diagnosed by:  A medical history.  A dietary history.  A physical exam. This may include a measurement of your body mass index (BMI).  Blood tests.  How is this treated? Treatments vary depending on the cause of the malnutrition. Common treatments include:  Dietary changes.  Dietary supplements, such as vitamins and minerals.  Treatment of any underlying conditions.  Follow these instructions at home:  Eat a balanced diet.  Take dietary supplements as directed by your health care provider.  Exercise regularly. Exercising can improve appetite.  Keep all follow-up visits as directed by your health care provider. This is important. How is this prevented? Eating a well-balanced diet helps to prevent most forms  of malnutrition. Contact a health care provider if:  You have increased weakness or fatigue.  You faint.  You stop  menstruating.  You have rapid hair loss.  You have unexpected weight loss. This information is not intended to replace advice given to you by your health care provider. Make sure you discuss any questions you have with your health care provider. Document Released: 07/03/2005 Document Revised: 01/23/2016 Document Reviewed: 04/13/2014 Elsevier Interactive Patient Education  [employer].

## 2017-05-13 MED ORDER — DEXTROSE 5 % IV SOLN
5.0000 mg/kg | INTRAVENOUS | Status: AC
  Administered 2017-05-14: 250 mg via INTRAVENOUS
  Filled 2017-05-13 (×2): qty 6.25

## 2017-05-13 NOTE — Progress Notes (Signed)
Cbc, bmet 05-02-17 faxed by epic to dr Louis Meckel to [facility] and dr Debby Bud fax. Spoke with patient by phone and gave new surgery. date and arrival time 05-14-17 530 am [facility] short stay npo after midnight. Take sertraline, tamsulosin and tylenol prn with sip of water.

## 2017-05-14 ENCOUNTER — Ambulatory Visit (HOSPITAL_COMMUNITY): Payer: Medicare Other | Admitting: Anesthesiology

## 2017-05-14 ENCOUNTER — Encounter (HOSPITAL_COMMUNITY)
Admission: RE | Disposition: A | Discharge status: Skilled Nursing Facility | Payer: Self-pay | Source: Ambulatory Visit | Attending: Urology

## 2017-05-14 ENCOUNTER — Ambulatory Visit (HOSPITAL_COMMUNITY): Payer: Medicare Other

## 2017-05-14 ENCOUNTER — Inpatient Hospital Stay (HOSPITAL_COMMUNITY)
Admission: RE | Admit: 2017-05-14 | Discharge: 2017-05-17 | DRG: 668 | Disposition: A | Discharge status: Skilled Nursing Facility | Payer: Medicare Other | Source: Ambulatory Visit | Attending: Urology | Admitting: Urology

## 2017-05-14 ENCOUNTER — Encounter (HOSPITAL_COMMUNITY): Payer: Self-pay | Admitting: *Deleted

## 2017-05-14 DIAGNOSIS — N202 Calculus of kidney with calculus of ureter: Principal | ICD-10-CM | POA: Diagnosis present

## 2017-05-14 DIAGNOSIS — Z888 Allergy status to other drugs, medicaments and biological substances status: Secondary | ICD-10-CM

## 2017-05-14 DIAGNOSIS — Z880 Allergy status to penicillin: Secondary | ICD-10-CM

## 2017-05-14 DIAGNOSIS — Z8379 Family history of other diseases of the digestive system: Secondary | ICD-10-CM

## 2017-05-14 DIAGNOSIS — E43 Unspecified severe protein-calorie malnutrition: Secondary | ICD-10-CM | POA: Diagnosis present

## 2017-05-14 DIAGNOSIS — D72829 Elevated white blood cell count, unspecified: Secondary | ICD-10-CM | POA: Diagnosis not present

## 2017-05-14 DIAGNOSIS — K5080 Crohn's disease of both small and large intestine without complications: Secondary | ICD-10-CM | POA: Diagnosis not present

## 2017-05-14 DIAGNOSIS — K219 Gastro-esophageal reflux disease without esophagitis: Secondary | ICD-10-CM | POA: Diagnosis present

## 2017-05-14 DIAGNOSIS — Z681 Body mass index (BMI) 19 or less, adult: Secondary | ICD-10-CM | POA: Diagnosis not present

## 2017-05-14 DIAGNOSIS — F329 Major depressive disorder, single episode, unspecified: Secondary | ICD-10-CM | POA: Diagnosis not present

## 2017-05-14 DIAGNOSIS — N200 Calculus of kidney: Secondary | ICD-10-CM | POA: Diagnosis not present

## 2017-05-14 DIAGNOSIS — E860 Dehydration: Secondary | ICD-10-CM | POA: Diagnosis not present

## 2017-05-14 DIAGNOSIS — Z9049 Acquired absence of other specified parts of digestive tract: Secondary | ICD-10-CM

## 2017-05-14 DIAGNOSIS — Z79899 Other long term (current) drug therapy: Secondary | ICD-10-CM

## 2017-05-14 DIAGNOSIS — T380X5A Adverse effect of glucocorticoids and synthetic analogues, initial encounter: Secondary | ICD-10-CM | POA: Diagnosis not present

## 2017-05-14 DIAGNOSIS — Z932 Ileostomy status: Secondary | ICD-10-CM

## 2017-05-14 DIAGNOSIS — E46 Unspecified protein-calorie malnutrition: Secondary | ICD-10-CM | POA: Diagnosis not present

## 2017-05-14 DIAGNOSIS — R109 Unspecified abdominal pain: Secondary | ICD-10-CM | POA: Diagnosis not present

## 2017-05-14 HISTORY — PX: NEPHROLITHOTOMY: SHX5134

## 2017-05-14 LAB — CBC
HCT: 29.3 % — ABNORMAL LOW (ref 39.0–52.0)
HCT: 25.7 % — ABNORMAL LOW (ref 39.0–52.0)
HEMOGLOBIN: 9.5 g/dL — AB (ref 13.0–17.0)
Hemoglobin: 8.5 g/dL — ABNORMAL LOW (ref 13.0–17.0)
MCH: 30.1 pg (ref 26.0–34.0)
MCH: 30.1 pg (ref 26.0–34.0)
MCHC: 32.4 g/dL (ref 30.0–36.0)
MCHC: 33.1 g/dL (ref 30.0–36.0)
MCV: 92.7 fL (ref 78.0–100.0)
MCV: 91.1 fL (ref 78.0–100.0)
PLATELETS: 428 10*3/uL — AB (ref 150–400)
PLATELETS: 419 10*3/uL — AB (ref 150–400)
RBC: 3.16 MIL/uL — AB (ref 4.22–5.81)
RBC: 2.82 MIL/uL — AB (ref 4.22–5.81)
RDW: 19.2 % — ABNORMAL HIGH (ref 11.5–15.5)
RDW: 19.1 % — AB (ref 11.5–15.5)
WBC: 52.7 10*3/uL (ref 4.0–10.5)
WBC: 57.7 10*3/uL — AB (ref 4.0–10.5)

## 2017-05-14 LAB — BASIC METABOLIC PANEL
ANION GAP: 8 (ref 5–15)
BUN: 10 mg/dL (ref 6–20)
CALCIUM: 7.3 mg/dL — AB (ref 8.9–10.3)
CO2: 15 mmol/L — ABNORMAL LOW (ref 22–32)
CREATININE: 1.02 mg/dL (ref 0.61–1.24)
Chloride: 115 mmol/L — ABNORMAL HIGH (ref 101–111)
Glucose, Bld: 160 mg/dL — ABNORMAL HIGH (ref 65–99)
Potassium: 4 mmol/L (ref 3.5–5.1)
Sodium: 138 mmol/L (ref 135–145)

## 2017-05-14 SURGERY — NEPHROLITHOTOMY PERCUTANEOUS
Anesthesia: General | Laterality: Left

## 2017-05-14 MED ORDER — ADULT MULTIVITAMIN W/MINERALS CH
1.0000 | ORAL_TABLET | Freq: Every day | ORAL | Status: DC
  Administered 2017-05-15 – 2017-05-17 (×3): 1 via ORAL
  Filled 2017-05-14 (×3): qty 1

## 2017-05-14 MED ORDER — FENTANYL CITRATE (PF) 100 MCG/2ML IJ SOLN
INTRAMUSCULAR | Status: AC
  Filled 2017-05-14: qty 2

## 2017-05-14 MED ORDER — PROPOFOL 10 MG/ML IV BOLUS
INTRAVENOUS | Status: DC | PRN
  Administered 2017-05-14: 100 mg via INTRAVENOUS

## 2017-05-14 MED ORDER — ROCURONIUM BROMIDE 10 MG/ML (PF) SYRINGE
PREFILLED_SYRINGE | INTRAVENOUS | Status: DC | PRN
  Administered 2017-05-14: 10 mg via INTRAVENOUS
  Administered 2017-05-14: 20 mg via INTRAVENOUS
  Administered 2017-05-14: 40 mg via INTRAVENOUS

## 2017-05-14 MED ORDER — MIDAZOLAM HCL 5 MG/5ML IJ SOLN
INTRAMUSCULAR | Status: DC | PRN
  Administered 2017-05-14: 2 mg via INTRAVENOUS

## 2017-05-14 MED ORDER — ACETAMINOPHEN 10 MG/ML IV SOLN
1000.0000 mg | Freq: Four times a day (QID) | INTRAVENOUS | Status: DC
  Administered 2017-05-14 – 2017-05-15 (×3): 1000 mg via INTRAVENOUS
  Filled 2017-05-14 (×4): qty 100

## 2017-05-14 MED ORDER — CIPROFLOXACIN HCL 500 MG PO TABS
500.0000 mg | ORAL_TABLET | Freq: Two times a day (BID) | ORAL | Status: DC
  Administered 2017-05-14 – 2017-05-17 (×6): 500 mg via ORAL
  Filled 2017-05-14 (×6): qty 1

## 2017-05-14 MED ORDER — PRO-STAT SUGAR FREE PO LIQD
30.0000 mL | Freq: Two times a day (BID) | ORAL | Status: DC
  Administered 2017-05-15 – 2017-05-17 (×3): 30 mL via ORAL
  Filled 2017-05-14 (×4): qty 30

## 2017-05-14 MED ORDER — IOHEXOL 300 MG/ML  SOLN
INTRAMUSCULAR | Status: DC | PRN
  Administered 2017-05-14: 50 mL via URETHRAL

## 2017-05-14 MED ORDER — ROCURONIUM BROMIDE 50 MG/5ML IV SOSY
PREFILLED_SYRINGE | INTRAVENOUS | Status: AC
  Filled 2017-05-14: qty 5

## 2017-05-14 MED ORDER — FOLIC ACID 1 MG PO TABS
1.0000 mg | ORAL_TABLET | Freq: Every day | ORAL | Status: DC
  Administered 2017-05-15 – 2017-05-17 (×3): 1 mg via ORAL
  Filled 2017-05-14 (×3): qty 1

## 2017-05-14 MED ORDER — LIDOCAINE 2% (20 MG/ML) 5 ML SYRINGE
INTRAMUSCULAR | Status: DC | PRN
  Administered 2017-05-14: 100 mg via INTRAVENOUS

## 2017-05-14 MED ORDER — CHOLECALCIFEROL 10 MCG (400 UNIT) PO TABS
400.0000 [IU] | ORAL_TABLET | Freq: Every day | ORAL | Status: DC
  Administered 2017-05-15 – 2017-05-17 (×3): 400 [IU] via ORAL
  Filled 2017-05-14 (×3): qty 1

## 2017-05-14 MED ORDER — PROMETHAZINE HCL 25 MG/ML IJ SOLN
6.2500 mg | INTRAMUSCULAR | Status: AC | PRN
  Administered 2017-05-14 (×2): 6.25 mg via INTRAVENOUS

## 2017-05-14 MED ORDER — SERTRALINE HCL 50 MG PO TABS
50.0000 mg | ORAL_TABLET | Freq: Every day | ORAL | Status: DC
  Administered 2017-05-15 – 2017-05-17 (×3): 50 mg via ORAL
  Filled 2017-05-14 (×3): qty 1

## 2017-05-14 MED ORDER — ONDANSETRON HCL 4 MG/2ML IJ SOLN
INTRAMUSCULAR | Status: DC | PRN
Start: 2017-05-14 — End: 2017-05-14
  Administered 2017-05-14: 4 mg via INTRAVENOUS

## 2017-05-14 MED ORDER — PROPOFOL 10 MG/ML IV BOLUS
INTRAVENOUS | Status: AC
  Filled 2017-05-14: qty 20

## 2017-05-14 MED ORDER — MIDODRINE HCL 5 MG PO TABS
10.0000 mg | ORAL_TABLET | Freq: Two times a day (BID) | ORAL | Status: DC
  Administered 2017-05-14 – 2017-05-17 (×7): 10 mg via ORAL
  Filled 2017-05-14 (×7): qty 2

## 2017-05-14 MED ORDER — HYDROMORPHONE HCL-NACL 0.5-0.9 MG/ML-% IV SOSY
PREFILLED_SYRINGE | INTRAVENOUS | Status: AC
  Filled 2017-05-14: qty 2

## 2017-05-14 MED ORDER — DIPHENOXYLATE-ATROPINE 2.5-0.025 MG PO TABS
1.0000 | ORAL_TABLET | Freq: Four times a day (QID) | ORAL | Status: DC
  Administered 2017-05-14 – 2017-05-17 (×13): 1 via ORAL
  Filled 2017-05-14 (×13): qty 1

## 2017-05-14 MED ORDER — SUGAMMADEX SODIUM 200 MG/2ML IV SOLN
INTRAVENOUS | Status: DC | PRN
  Administered 2017-05-14: 200 mg via INTRAVENOUS

## 2017-05-14 MED ORDER — CEFAZOLIN SODIUM-DEXTROSE 2-3 GM-% IV SOLR
2.0000 g | Freq: Once | INTRAVENOUS | Status: AC
  Administered 2017-05-14: 2 g via INTRAVENOUS

## 2017-05-14 MED ORDER — POTASSIUM CHLORIDE CRYS ER 20 MEQ PO TBCR
20.0000 meq | EXTENDED_RELEASE_TABLET | ORAL | Status: DC
  Administered 2017-05-17: 20 meq via ORAL
  Filled 2017-05-14: qty 1

## 2017-05-14 MED ORDER — DEXAMETHASONE SODIUM PHOSPHATE 10 MG/ML IJ SOLN
INTRAMUSCULAR | Status: AC
  Filled 2017-05-14: qty 1

## 2017-05-14 MED ORDER — CEFAZOLIN SODIUM-DEXTROSE 2-4 GM/100ML-% IV SOLN
INTRAVENOUS | Status: AC
  Filled 2017-05-14: qty 100

## 2017-05-14 MED ORDER — DEXAMETHASONE SODIUM PHOSPHATE 10 MG/ML IJ SOLN
INTRAMUSCULAR | Status: DC | PRN
  Administered 2017-05-14: 10 mg via INTRAVENOUS

## 2017-05-14 MED ORDER — HYDROMORPHONE HCL-NACL 0.5-0.9 MG/ML-% IV SOSY
1.0000 mg | PREFILLED_SYRINGE | INTRAVENOUS | Status: DC | PRN
  Administered 2017-05-14 – 2017-05-16 (×11): 1 mg via INTRAVENOUS
  Filled 2017-05-14 (×11): qty 2

## 2017-05-14 MED ORDER — PROMETHAZINE HCL 25 MG/ML IJ SOLN
INTRAMUSCULAR | Status: AC
  Filled 2017-05-14: qty 1

## 2017-05-14 MED ORDER — LIDOCAINE 2% (20 MG/ML) 5 ML SYRINGE
INTRAMUSCULAR | Status: AC
  Filled 2017-05-14: qty 5

## 2017-05-14 MED ORDER — HYDROMORPHONE HCL-NACL 0.5-0.9 MG/ML-% IV SOSY
0.5000 mg | PREFILLED_SYRINGE | INTRAVENOUS | Status: DC | PRN
  Administered 2017-05-14 (×2): 0.5 mg via INTRAVENOUS
  Filled 2017-05-14 (×2): qty 1

## 2017-05-14 MED ORDER — MIDAZOLAM HCL 2 MG/2ML IJ SOLN
INTRAMUSCULAR | Status: AC
  Filled 2017-05-14: qty 2

## 2017-05-14 MED ORDER — SUCCINYLCHOLINE CHLORIDE 200 MG/10ML IV SOSY
PREFILLED_SYRINGE | INTRAVENOUS | Status: DC | PRN
Start: 2017-05-14 — End: 2017-05-14
  Administered 2017-05-14: 100 mg via INTRAVENOUS

## 2017-05-14 MED ORDER — BUPIVACAINE-EPINEPHRINE (PF) 0.5% -1:200000 IJ SOLN
INTRAMUSCULAR | Status: DC | PRN
  Administered 2017-05-14: 30 mL

## 2017-05-14 MED ORDER — KCL IN DEXTROSE-NACL 20-5-0.45 MEQ/L-%-% IV SOLN
INTRAVENOUS | Status: DC
Start: 2017-05-14 — End: 2017-05-16
  Administered 2017-05-14 – 2017-05-16 (×4): via INTRAVENOUS
  Filled 2017-05-14 (×6): qty 1000

## 2017-05-14 MED ORDER — ONDANSETRON HCL 4 MG/2ML IJ SOLN
INTRAMUSCULAR | Status: AC
  Filled 2017-05-14: qty 2

## 2017-05-14 MED ORDER — TAMSULOSIN HCL 0.4 MG PO CAPS
0.4000 mg | ORAL_CAPSULE | Freq: Every day | ORAL | Status: DC
  Administered 2017-05-15 – 2017-05-17 (×3): 0.4 mg via ORAL
  Filled 2017-05-14 (×3): qty 1

## 2017-05-14 MED ORDER — ENSURE ENLIVE PO LIQD: 237.0000 mL | Freq: Two times a day (BID) | ORAL | Status: DC

## 2017-05-14 MED ORDER — HYDROMORPHONE HCL-NACL 0.5-0.9 MG/ML-% IV SOSY
0.2500 mg | PREFILLED_SYRINGE | INTRAVENOUS | Status: DC | PRN
  Administered 2017-05-14 (×4): 0.25 mg via INTRAVENOUS

## 2017-05-14 MED ORDER — DIAZEPAM 5 MG PO TABS
10.0000 mg | ORAL_TABLET | Freq: Four times a day (QID) | ORAL | Status: DC | PRN
  Administered 2017-05-14 – 2017-05-17 (×7): 10 mg via ORAL
  Filled 2017-05-14 (×7): qty 2

## 2017-05-14 MED ORDER — SUCCINYLCHOLINE CHLORIDE 200 MG/10ML IV SOSY
PREFILLED_SYRINGE | INTRAVENOUS | Status: AC
  Filled 2017-05-14: qty 10

## 2017-05-14 MED ORDER — FENTANYL CITRATE (PF) 100 MCG/2ML IJ SOLN
INTRAMUSCULAR | Status: DC | PRN
  Administered 2017-05-14 (×3): 25 ug via INTRAVENOUS
  Administered 2017-05-14 (×2): 50 ug via INTRAVENOUS
  Administered 2017-05-14: 25 ug via INTRAVENOUS
  Administered 2017-05-14: 50 ug via INTRAVENOUS

## 2017-05-14 MED ORDER — ONDANSETRON 4 MG PO TBDP
4.0000 mg | ORAL_TABLET | Freq: Three times a day (TID) | ORAL | Status: DC | PRN
  Administered 2017-05-14 – 2017-05-16 (×3): 4 mg via ORAL
  Filled 2017-05-14 (×3): qty 1

## 2017-05-14 MED ORDER — LACTATED RINGERS IV SOLN
INTRAVENOUS | Status: DC
  Administered 2017-05-14 (×3): via INTRAVENOUS

## 2017-05-14 MED ORDER — BUPIVACAINE-EPINEPHRINE (PF) 0.5% -1:200000 IJ SOLN
INTRAMUSCULAR | Status: AC
  Filled 2017-05-14: qty 30

## 2017-05-14 MED ORDER — SODIUM CHLORIDE 0.9 % IR SOLN
Status: DC | PRN
  Administered 2017-05-14: 30000 mL

## 2017-05-14 MED ORDER — FAMOTIDINE IN NACL 20-0.9 MG/50ML-% IV SOLN
20.0000 mg | Freq: Two times a day (BID) | INTRAVENOUS | Status: DC
  Administered 2017-05-14 – 2017-05-15 (×2): 20 mg via INTRAVENOUS
  Filled 2017-05-14 (×2): qty 50

## 2017-05-14 MED ORDER — OXYCODONE HCL 5 MG PO TABS
5.0000 mg | ORAL_TABLET | ORAL | Status: DC | PRN
  Administered 2017-05-14 – 2017-05-17 (×9): 10 mg via ORAL
  Filled 2017-05-14 (×9): qty 2

## 2017-05-14 SURGICAL SUPPLY — 52 items
BAG URINE DRAINAGE (UROLOGICAL SUPPLIES) ×6 IMPLANT
BASKET STNLS GEMINI 4WIRE 3FR (BASKET) IMPLANT
BASKET STONE NCOMPASS (UROLOGICAL SUPPLIES) IMPLANT
BASKET ZERO TIP NITINOL 2.4FR (BASKET) ×3 IMPLANT
BENZOIN TINCTURE PRP APPL 2/3 (GAUZE/BANDAGES/DRESSINGS) ×6 IMPLANT
BLADE SURG 15 STRL LF DISP TIS (BLADE) ×1 IMPLANT
BLADE SURG 15 STRL SS (BLADE) ×2
CATH AINSWORTH 30CC 24FR (CATHETERS) IMPLANT
CATH FOLEY 2WAY SLVR  5CC 16FR (CATHETERS) ×2
CATH FOLEY 2WAY SLVR 5CC 16FR (CATHETERS) ×1 IMPLANT
CATH IMAGER II 65CM (CATHETERS) ×3 IMPLANT
CATH URET 5FR 28IN OPEN ENDED (CATHETERS) ×3 IMPLANT
CATH URET DUAL LUMEN 6-10FR 50 (CATHETERS) ×3 IMPLANT
CATH X-FORCE N30 NEPHROSTOMY (TUBING) ×3 IMPLANT
CHLORAPREP W/TINT 26ML (MISCELLANEOUS) ×3 IMPLANT
DRAPE C-ARM 42X120 X-RAY (DRAPES) ×3 IMPLANT
DRAPE INCISE IOBAN 66X45 STRL (DRAPES) ×3 IMPLANT
DRAPE LINGEMAN PERC (DRAPES) ×3 IMPLANT
DRSG PAD ABDOMINAL 8X10 ST (GAUZE/BANDAGES/DRESSINGS) ×6 IMPLANT
DRSG TEGADERM 8X12 (GAUZE/BANDAGES/DRESSINGS) ×6 IMPLANT
FIBER LASER FLEXIVA 1000 (UROLOGICAL SUPPLIES) IMPLANT
FIBER LASER FLEXIVA 365 (UROLOGICAL SUPPLIES) IMPLANT
FIBER LASER FLEXIVA 550 (UROLOGICAL SUPPLIES) IMPLANT
FIBER LASER TRAC TIP (UROLOGICAL SUPPLIES) IMPLANT
GAUZE SPONGE 4X4 12PLY STRL (GAUZE/BANDAGES/DRESSINGS) ×3 IMPLANT
GLOVE BIOGEL M STRL SZ7.5 (GLOVE) ×3 IMPLANT
GOWN STRL REUS W/TWL XL LVL3 (GOWN DISPOSABLE) ×3 IMPLANT
GUIDEWIRE ANG ZIPWIRE 038X150 (WIRE) IMPLANT
GUIDEWIRE STR DUAL SENSOR (WIRE) ×3 IMPLANT
IV SET EXTENSION CATH 6 NF (IV SETS) ×3 IMPLANT
KIT BASIN OR (CUSTOM PROCEDURE TRAY) ×3 IMPLANT
MANIFOLD NEPTUNE II (INSTRUMENTS) ×3 IMPLANT
NEEDLE SPNL 20GX3.5 QUINCKE YW (NEEDLE) ×3 IMPLANT
NEEDLE TROCAR 18X15 ECHO (NEEDLE) IMPLANT
NEEDLE TROCAR 18X20 (NEEDLE) ×3 IMPLANT
NS IRRIG 1000ML POUR BTL (IV SOLUTION) ×3 IMPLANT
PACK CYSTO (CUSTOM PROCEDURE TRAY) ×3 IMPLANT
PAD ABD 8X10 STRL (GAUZE/BANDAGES/DRESSINGS) ×6 IMPLANT
PROBE LITHOCLAST ULTRA 3.8X403 (UROLOGICAL SUPPLIES) ×3 IMPLANT
PROBE PNEUMATIC 1.0MMX570MM (UROLOGICAL SUPPLIES) ×3 IMPLANT
SHEATH PEELAWAY SET 9 (SHEATH) ×3 IMPLANT
SPONGE LAP 4X18 X RAY DECT (DISPOSABLE) ×3 IMPLANT
STENT URET 6FRX26 CONTOUR (STENTS) ×3 IMPLANT
STONE CATCHER W/TUBE ADAPTER (UROLOGICAL SUPPLIES) IMPLANT
SUT ETHILON 2 0 PS N (SUTURE) ×3 IMPLANT
SYR 10ML LL (SYRINGE) ×3 IMPLANT
SYR 20CC LL (SYRINGE) ×6 IMPLANT
SYR 50ML LL SCALE MARK (SYRINGE) ×3 IMPLANT
SYRINGE IRR TOOMEY STRL 70CC (SYRINGE) IMPLANT
TOWEL OR 17X26 10 PK STRL BLUE (TOWEL DISPOSABLE) ×3 IMPLANT
TUBING CONNECTING 10 (TUBING) ×4 IMPLANT
TUBING CONNECTING 10' (TUBING) ×2

## 2017-05-14 NOTE — Progress Notes (Signed)
Patients WBC count increased. Gregory [name] with Urology notified. Will continue to monitor.

## 2017-05-14 NOTE — Progress Notes (Signed)
WBC 52.7. Result called to MD. Will follow up on any new orders.

## 2017-05-14 NOTE — Op Note (Signed)
Pre-operative diagnosis: left renal pelvis stones, <2.0 cm Post-operative diagnosis: as above   Procedure performed: cystoscopy, left retrograde pyelogram with interpretation, left percutaneous renal access, left nephrolithotomy, left nephrostogram,  left nephrostomy tube placement.    Surgeon: Dr.  W.    Anesthesia: General  Complications: None  Specimens: The majority of the stones were removed and will be sent to the [facility] for further analysis.  Findings: 1. Small perforation of the renal pelvis with some small stone fragments noted outside the collecting system. 2. A 28 cm times 6 French double-J stent was left.  EBL: Approximately 150 cc  Specimens: stone from collecting system - taken to [facility]  Indication: Gregory E Preiss Jr. is a 56 y.o. patient with a history of nephrolithiasis with a large stone burden in the left kidney. After reviewing the management options for treatment, he elected to proceed with the above surgical procedure(s). We have discussed the potential benefits and risks of the procedure, side effects of the proposed treatment, the likelihood of the patient achieving the goals of the procedure, and any potential problems that might occur during the procedure or recuperation. Informed consent has been obtained.   Description:  Consent was obtained in the preoperative holding area. The patient was marked appropriately and then taken back to the operating room where he was intubated on the gurney. The patient was flipped prone onto the split leg OR table. Large jelly rolls were placed in the anterior axillary line on both sides allowing the patient's chest and abdomen to fall inbetween. The patient was then prepped and draped in the routine sterile fashion in the left flank and genital area. A timeout was then held confirming the proper side and procedure as well as antibiotics were administered.  I injected the  nephrostomy tube that was placed previously and opacified the collecting system. I noted that the nephrostomy tube was placed in the renal pelvis and as such I opted to obtain access in a different location. As such, using the C-arm rotated at 25 and the bulls-eye technique with an 18-gauge coaxial needle the mid pole lateral calyx was targeted. Then rotating the C-arm AP depth of our needle was noted to be within the calyx and the inner part of the coaxial needle was removed. Urine was noted to return. A 0.038 sensor wire was then passed through the sheath of the coaxial needle and into the left renal collecting system. The wire was then passed down the ureter and into the bladder using fluoroscopic guide and the sheath of the needle was removed.  An angiographic catheter was then advanced into the bladder and the wire removed.  A Super Stiff wire was then passed into the angiographic catheter and the angiographic catheter removed. A 9 French peel-away dilator was then advanced over the Super Stiff wire and passed into the renal pelvis and across the UPJ under fluoroscopic guidance.  The inner part of this dilator was removed and the 0.38 sensor wire was passed alongside the Super Stiff wire through the dilator and into the left ureter and down into the bladder. The angiographic catheter again was passed over the guidewire and advanced into the bladder, the wire was then removed. A Super Stiff wire was then passed through angiographic catheter and angiographic catheter removed. The outer part of the sheath was then removed, establishing 2 superstiff wires through the targeted calyx and into the bladder.   The 30 French NephroMax balloon was then passed   over one of the Super Stiff wires and the tip guided down into the targeted calyx. The balloon was then inflated to approximately 12 atm, and once there was no waist noted under fluoroscopy the access sheath was advanced over the balloon. The balloon was then  removed. The wires were then placed back into the sheaths and snapped to the drape.   Using the rigid nephroscope to explore the targeted calyx and kidney the UPJ stone was noted. I used the lithoclast device to fragment or the stone fragments.  There was a injury to the renal pelvis from the lithoclast device. There are some stone noted to pushed outside the collecting system which I left behind.  Then using the flexiblecystoscope the ureter was navigated in antegrade fashion. Using a 0 tip basket the stones were grasped and removed from the ureter. Once the ureter was clear the scope was advanced into the bladder and a 0.038 sensor wire was left in the bladder and the scope backed out over wire. The sensor wire was then backloaded over the rigid nephroscope using the stent pusher and a 28 cm x 6 French double-J ureteral stent was passed antegrade over the sensor wire down into the bladder under fluoroscopic guidance. Once the stent was in the bladder the wire was gently pulled back and a nice curl noted in the bladder. The wire completely removed from the stent, and nice curl on the proximal end of the stent was noted in the renal pelvis. The sheath was then backed out slowly to ensure that all calyces had been inspected and there was nothing behind the sheath.   A 27F ainsworth tip catheter was then passed over one of the Super Stiff wires through the sheath and into the renal pelvis. The sheath was then backed out of the kidney and cut over the red rubber catheter. A nephrostogram was then performed confirming the position of our nephrostomy tube and reassuring that there were no longer any filling defects from the patient's collecting system.  25 cc of local anesthesia was then injected into the patient's wound,and the nephrostomy tube was secured with a 3-0 nylon suture. The tube was left to gravity drainage.. The incision was then padded using a bundle of 4 x 4's and Hypafix tape. Patient was  subsequently rolled over to the supine position and extubated. The patient was returned to the PACU in excellent condition. At the end of the case all lap and needle and sponges were accounted for. There are no perioperative complications.

## 2017-05-14 NOTE — Transfer of Care (Signed)
Immediate Anesthesia Transfer of Care Note  Patient: Gregory [name].  Procedure(s) Performed: Procedure(s): LEFT NEPHROLITHOTOMY PERCUTANEOUS WITH SURGEON ACCESS (Left)  Patient Location: PACU  Anesthesia Type:General  Level of Consciousness: sedated  Airway & Oxygen Therapy: Patient Spontanous Breathing and Patient connected to face mask oxygen  Post-op Assessment: Report given to RN and Post -op Vital signs reviewed and stable  Post vital signs: Reviewed and stable  Last Vitals:  Vitals:   05/14/17 0522  BP: 109/70  Pulse: 91  Resp: 16  Temp: 36.7 C  SpO2: 99%    Last Pain:  Vitals:   05/14/17 0607  TempSrc:   PainSc: 3       Patients Stated Pain Goal: 4 (35/52/17 4715)  Complications: No apparent anesthesia complications

## 2017-05-14 NOTE — Progress Notes (Signed)
Initial Nutrition Assessment  DOCUMENTATION CODES:   Severe malnutrition in context of chronic illness, Underweight  INTERVENTION:   30 ml Prostat BID, each supplement provides 100 kcals and 15 grams protein.   Ensure Enlive po BID, each supplement provides 350 kcal and 20 grams of protein  Provide MVI daily  NUTRITION DIAGNOSIS:   Malnutrition (Severe) related to chronic illness (Crohn's disease/Short gut syndrome) as evidenced by percent weight loss, severe depletion of body fat, severe depletion of muscle mass.  GOAL:   Patient will meet greater than or equal to 90% of their needs  MONITOR:   PO intake, Supplement acceptance, Labs, Weight trends  REASON FOR ASSESSMENT:   Malnutrition Screening Tool    ASSESSMENT:   Pt with PMH of endocarditis, Crohn's disease s/p colectomy (short gut syndrome) and ileostomy, recent obstructing nephrolithiasis s/p nephrostomy placement 04/12/17. Pt discharged home without antibiotics and is now at the end of his prednisone taper for Crohn's flare up. Presents this admission with possible pyelonephritis or urosepsis. Pt noted to have intermitted fevers with n/v. Scheduled for large kidney stone removal 05/14/17.   Attempted to speak with pt at bedside, too lethargic to answer questions.Records indicate pt has lost 5% of body wt in 3 months. This percentage in this time frame is not significant. Wt noted to be somewhat stable since last admission but still very low. A limited Nutrition-Focused physical exam was completed. Findings are severe fat depletion, severe muscle depletion, and no edema. This pt remains severely malnourished. Gave tips on how to gain wt last [place] admission. Pt seems to be struggling given his health condition and recurrent admissions. Will re-visit and discuss intake when pt wakes up.    Medications reviewed and include: Vit D, folic acid, KCl, NaCl with Kcl and D5 @ 100 ml/hr, IV abx Labs reviewed: Calcium 8.4 (L)    Diet Order:  Diet regular Room service appropriate? Yes; Fluid consistency: Thin  Skin:   (left back closed incision)  Last BM:  9/14 - ostomy  Height:   Ht Readings from Last 1 Encounters:  05/14/17 5' 11"  (1.803 m)    Weight:   Wt Readings from Last 1 Encounters:  05/14/17 111 lb 8 oz (50.6 kg)    Ideal Body Weight:  78.2 kg  BMI:  Body mass index is 15.55 kg/m.  Estimated Nutritional Needs:   Kcal:  1800-2000 (35-40 kcal/kg)  Protein:  100-115 grams (2-2.3 g/kg)  Fluid:  >1.8 L/day  EDUCATION NEEDS:   No education needs identified at this time  [place], LDN Clinical Nutrition Pager # - [phone]

## 2017-05-14 NOTE — Anesthesia Postprocedure Evaluation (Signed)
Anesthesia Post Note  Patient: Gregory [name].  Procedure(s) Performed: Procedure(s) (LRB): LEFT NEPHROLITHOTOMY PERCUTANEOUS WITH SURGEON ACCESS (Left)     Patient location during evaluation: PACU Anesthesia Type: General Level of consciousness: awake and alert Pain management: pain level controlled Vital Signs Assessment: post-procedure vital signs reviewed and stable Respiratory status: spontaneous breathing, nonlabored ventilation, respiratory function stable and patient connected to nasal cannula oxygen Cardiovascular status: blood pressure returned to baseline and stable Postop Assessment: no apparent nausea or vomiting Anesthetic complications: no    Last Vitals:  Vitals:   05/14/17 1200 05/14/17 1225  BP: 117/77 120/82  Pulse: 91 90  Resp: 17 16  Temp: (!) 36.3 C (!) 36.4 C  SpO2: 100% 100%    Last Pain:  Vitals:   05/14/17 1240  TempSrc:   PainSc: 7                  Hodari Chuba S

## 2017-05-14 NOTE — Interval H&P Note (Signed)
History and Physical Interval Note:  05/14/2017 7:31 AM  Gregory [name].  has presented today for surgery, with the diagnosis of LEFT KIDNEY STONE  The various methods of treatment have been discussed with the patient and family. After consideration of risks, benefits and other options for treatment, the patient has consented to  Procedure(s): LEFT NEPHROLITHOTOMY PERCUTANEOUS WITH SURGEON ACCESS (Left) as a surgical intervention .  The patient's history has been reviewed, patient examined, no change in status, stable for surgery.  I have reviewed the patient's chart and labs.  Questions were answered to the patient's satisfaction.     Louis Meckel W

## 2017-05-14 NOTE — Anesthesia Procedure Notes (Signed)
Procedure Name: Intubation Date/Time: 05/14/2017 7:41 AM Performed by: Lind Covert Pre-anesthesia Checklist: Patient identified, Emergency Drugs available, Suction available, Patient being monitored and Timeout performed Patient Re-evaluated:Patient Re-evaluated prior to induction Oxygen Delivery Method: Circle system utilized Preoxygenation: Pre-oxygenation with 100% oxygen Induction Type: IV induction Laryngoscope Size: Mac and 4 Grade View: Grade I Tube type: Oral Tube size: 7.5 mm Number of attempts: 1 Airway Equipment and Method: Stylet Placement Confirmation: ETT inserted through vocal cords under direct vision,  positive ETCO2 and breath sounds checked- equal and bilateral Secured at: 22 cm Tube secured with: Tape Dental Injury: Teeth and Oropharynx as per pre-operative assessment

## 2017-05-14 NOTE — Progress Notes (Signed)
Post-op check  S: Pain in back, mild nausea.  No abdominal pain.  O: Vitals:   05/14/17 1145 05/14/17 1200 05/14/17 1225 05/14/17 1535  BP: 115/79 117/77 120/82 115/75  Pulse: 93 91 90 86  Resp: 16 17 16 17   Temp:  (!) 97.3 F (36.3 C) (!) 97.5 F (36.4 C) 97.6 F (36.4 C)  TempSrc:    Oral  SpO2: 100% 100% 100% 100%  Weight:      Height:       Abdomen is soft Neph tube draining straw colored urine Foley draining clear urine Flank incision site is dressed/clean   Recent Labs  05/14/17 1502  WBC 52.7*  HGB 9.5*  HCT 29.3*    Recent Labs  05/14/17 1502  NA 138  K 4.0  CL 115*  CO2 15*  GLUCOSE 160*  BUN 10  CREATININE 1.02  CALCIUM 7.3*   No results for input(s): LABPT, INR in the last 72 hours. No results for input(s): PSA in the last 72 hours. No results for input(s): LABURIN in the last 72 hours. Results for orders placed or performed during the hospital encounter of 04/28/17  Urine culture     Status: Abnormal   Collection Time: 04/28/17  5:28 PM  Result Value Ref Range Status   Specimen Description URINE, CLEAN CATCH  Final   Special Requests NONE  Final   Culture MULTIPLE SPECIES PRESENT, SUGGEST RECOLLECTION (A)  Final   Report Status 04/29/2017 FINAL  Final  Culture, blood (Routine X 2) w Reflex to ID Panel     Status: None   Collection Time: 04/29/17  8:03 AM  Result Value Ref Range Status   Specimen Description BLOOD LEFT HAND  Final   Special Requests   Final    BOTTLES DRAWN AEROBIC AND ANAEROBIC Blood Culture results may not be optimal due to an excessive volume of blood received in culture bottles   Culture NO GROWTH 5 DAYS  Final   Report Status 05/04/2017 FINAL  Final  Culture, blood (Routine X 2) w Reflex to ID Panel     Status: None   Collection Time: 04/29/17  8:03 AM  Result Value Ref Range Status   Specimen Description BLOOD RIGHT ANTECUBITAL  Final   Special Requests   Final    BOTTLES DRAWN AEROBIC AND ANAEROBIC Blood Culture  adequate volume   Culture NO GROWTH 5 DAYS  Final   Report Status 05/04/2017 FINAL  Final    A: Stable from left PCNL, pain not well controlled.  Leukocystosis.  P: increase pain medication.  Recheck labs now.  Continue with cipro post-op.  Re-evaluate in AM.

## 2017-05-14 NOTE — Anesthesia Preprocedure Evaluation (Signed)
Anesthesia Evaluation  Patient identified by MRN, date of birth, ID band Patient awake    Reviewed: Allergy & Precautions, NPO status , Patient's Chart, lab work & pertinent test results  Airway Mallampati: II  TM Distance: >3 FB Neck ROM: Full    Dental no notable dental hx.    Pulmonary neg pulmonary ROS, former smoker,    Pulmonary exam normal breath sounds clear to auscultation       Cardiovascular negative cardio ROS Normal cardiovascular exam Rhythm:Regular Rate:Normal     Neuro/Psych negative neurological ROS  negative psych ROS   GI/Hepatic Neg liver ROS, GERD  ,chrohns dz   Endo/Other  negative endocrine ROS  Renal/GU negative Renal ROS  negative genitourinary   Musculoskeletal negative musculoskeletal ROS (+)   Abdominal   Peds negative pediatric ROS (+)  Hematology negative hematology ROS (+)   Anesthesia Other Findings   Reproductive/Obstetrics negative OB ROS                             Anesthesia Physical Anesthesia Plan  ASA: II  Anesthesia Plan: General   Post-op Pain Management:    Induction: Intravenous  PONV Risk Score and Plan: 1 and Ondansetron and Dexamethasone  Airway Management Planned: Oral ETT  Additional Equipment:   Intra-op Plan:   Post-operative Plan: Extubation in OR  Informed Consent: I have reviewed the patients History and Physical, chart, labs and discussed the procedure including the risks, benefits and alternatives for the proposed anesthesia with the patient or authorized representative who has indicated his/her understanding and acceptance.   Dental advisory given  Plan Discussed with: CRNA and Surgeon  Anesthesia Plan Comments:         Anesthesia Quick Evaluation

## 2017-05-14 NOTE — H&P (Signed)
Chief Complaint: I feel like i'm dying  History of Present Illness: Gregory [name] is a 56 y.o. year old admitted for clinic for nausea and vomiting and intermittent fevers. In clinic, the patient had a 102 fever and a heart rate of 132. The patient is complaining of some generalized pain and weakness and lack of appetite.  The patient is scheduled for removal of a large kidney stone on Thursday (2 days).  The patient has a large left-sided obstructing proximal ureteral stone.  He was admitted to the hospital approximately one week ago for which the nephrostomy tube was placed.  He was discharged home after several days in the hospital.  The patient states that while at home he never really felt like he was getting better.  All cultures from the hospital were negative, the patient was not discharged on any antibiotics.  The patient has a past medical history significant for Crohn's disease and was recently treated for a flare.  He is now at the end of his prednisone taper.  He also is taking methotrexate weekly.         Past Medical History:  Diagnosis Date  . Acute endocarditis   . Crohn's disease ([place])   . GERD (gastroesophageal reflux disease)   . Short gut syndrome          Past Surgical History:  Procedure Laterality Date  . ILEOSTOMY    . IR NEPHROSTOMY EXCHANGE LEFT  04/12/2017  . IR NEPHROSTOMY PLACEMENT LEFT  04/12/2017  . SMALL INTESTINE SURGERY    . SUBTOTAL COLECTOMY      Home Medications:    Allergies:       Allergies  Allergen Reactions  . Humira [Adalimumab]     Intolerance  . Penicillins Other (See Comments)    Has patient had a PCN reaction causing immediate rash, facial/tongue/throat swelling, SOB or lightheadedness with hypotension: Unknown Has patient had a PCN reaction causing severe rash involving mucus membranes or skin necrosis: Unknown Has patient had a PCN reaction that required hospitalization: Unknown Has patient had a  PCN reaction occurring within the last 10 years: No If all of the above answers are "NO", then may proceed with Cephalosporin use.   . Remicade [Infliximab]     Intolerance         Family History  Problem Relation Age of Onset  . Heart disease Mother   . Diabetes Father   . Heart disease Father   . Melanoma Sister   . Crohn's disease Brother   . Inflammatory bowel disease Brother   . Diabetes Paternal Grandmother     Social History:  reports that he has never smoked. He has never used smokeless tobacco. He reports that he does not drink alcohol or use drugs.  ROS: A complete review of systems was performed.  All systems are negative except for pertinent findings as noted.  Physical Exam:  Vital signs in last 24 hours: Temp:  [98.9 F (37.2 C)-99.8 F (37.7 C)] 99.8 F (37.7 C) (08/21 2008) Pulse Rate:  [89-90] 90 (08/21 2008) Resp:  [12-15] 12 (08/21 2042) BP: (91-114)/(63-78) 91/63 (08/21 2008) SpO2:  [95 %-98 %] 97 % (08/21 2042) Weight:  [49.3 kg (108 lb 11 oz)] 49.3 kg (108 lb 11 oz) (08/21 1801) Constitutional:  Alert and oriented, very thin, weak Cardiovascular: Regular rate and rhythm, No JVD Respiratory: Normal respiratory effort, Lungs clear bilaterally GI: Abdomen is soft, nontender, nondistended, no abdominal masses GU: mild left flank pain, nephrostomy  tube draining straw colored urine. Neurologic: Grossly intact, no focal deficits Psychiatric: Normal mood and affect   Laboratory Data:   Recent Labs (last 2 labs)    Recent Labs  04/20/17 1851  WBC 1.7*  HGB 11.4*  HCT 34.9*      Recent Labs (last 2 labs)    Recent Labs  04/20/17 1851  NA 136  K 3.9  CL 100*  CO2 26  GLUCOSE 111*  BUN 13  CREATININE 1.03  CALCIUM 8.6*     Recent Labs (last 2 labs)   No results for input(s): LABPT, INR in the last 72 hours.   Recent Labs (last 2 labs)   No results for input(s): LABURIN in the last 72 hours.           Results for orders placed or performed during the hospital encounter of 04/10/17  Culture, Urine     Status: None   Collection Time: 04/11/17  3:10 PM  Result Value Ref Range Status   Specimen Description URINE, CLEAN CATCH  Final   Special Requests NONE  Final   Culture   Final    NO GROWTH Performed at [facility], [place] [address] [place], [place] [address]    Report Status 04/12/2017 FINAL  Final     Radiologic Imaging: Imaging Results (Last 48 hours)  No results found.    Impression/Assessment:  The patient is immunocompromised and likely has either pyelonephritis or urosepsis and associated dehydration.fortunately, with IV fluids his lactate has cleared.  Concerning is his white blood cell count is 1.7K, which may be from the methotrexate or bacteremia.  Plan:  Neuro - PCA for pain, APAP PRN CV - Lactated ringers at 125cc, stress dose steroids - hydrocortisone 50m IV q6 with quick taper (1/2 daily).   On telemetry GI - Holding methotrexate and oral predisone for the time being, resume other crohn's meds/vitamins.  Regular diet as tolerated with nutritional supplements.  Zofran PRN.  KUB pending. ID - meropenem 126m Q8 (started on 8/21).  Blood and urine cultures are pending.  Narrow when able.   Prop- famotidine, SCDs, heparin, encourage ambulation   Patient is scheduled for surgery on Thursday.  At this point, this is likely to be postponed.  He is likely to be in the hospital for 2-4 days.

## 2017-05-15 ENCOUNTER — Observation Stay (HOSPITAL_COMMUNITY): Payer: Medicare Other

## 2017-05-15 DIAGNOSIS — R112 Nausea with vomiting, unspecified: Secondary | ICD-10-CM | POA: Diagnosis not present

## 2017-05-15 DIAGNOSIS — M6281 Muscle weakness (generalized): Secondary | ICD-10-CM | POA: Diagnosis not present

## 2017-05-15 DIAGNOSIS — R2689 Other abnormalities of gait and mobility: Secondary | ICD-10-CM | POA: Diagnosis not present

## 2017-05-15 DIAGNOSIS — I959 Hypotension, unspecified: Secondary | ICD-10-CM | POA: Diagnosis not present

## 2017-05-15 DIAGNOSIS — E43 Unspecified severe protein-calorie malnutrition: Secondary | ICD-10-CM | POA: Diagnosis present

## 2017-05-15 DIAGNOSIS — Z880 Allergy status to penicillin: Secondary | ICD-10-CM | POA: Diagnosis not present

## 2017-05-15 DIAGNOSIS — T380X5A Adverse effect of glucocorticoids and synthetic analogues, initial encounter: Secondary | ICD-10-CM | POA: Diagnosis not present

## 2017-05-15 DIAGNOSIS — N202 Calculus of kidney with calculus of ureter: Secondary | ICD-10-CM | POA: Diagnosis present

## 2017-05-15 DIAGNOSIS — Z8379 Family history of other diseases of the digestive system: Secondary | ICD-10-CM | POA: Diagnosis not present

## 2017-05-15 DIAGNOSIS — N400 Enlarged prostate without lower urinary tract symptoms: Secondary | ICD-10-CM | POA: Diagnosis not present

## 2017-05-15 DIAGNOSIS — R278 Other lack of coordination: Secondary | ICD-10-CM | POA: Diagnosis not present

## 2017-05-15 DIAGNOSIS — G8929 Other chronic pain: Secondary | ICD-10-CM | POA: Diagnosis not present

## 2017-05-15 DIAGNOSIS — N390 Urinary tract infection, site not specified: Secondary | ICD-10-CM | POA: Diagnosis not present

## 2017-05-15 DIAGNOSIS — Z9049 Acquired absence of other specified parts of digestive tract: Secondary | ICD-10-CM | POA: Diagnosis not present

## 2017-05-15 DIAGNOSIS — R109 Unspecified abdominal pain: Secondary | ICD-10-CM | POA: Diagnosis not present

## 2017-05-15 DIAGNOSIS — J189 Pneumonia, unspecified organism: Secondary | ICD-10-CM | POA: Diagnosis not present

## 2017-05-15 DIAGNOSIS — Z888 Allergy status to other drugs, medicaments and biological substances status: Secondary | ICD-10-CM | POA: Diagnosis not present

## 2017-05-15 DIAGNOSIS — E860 Dehydration: Secondary | ICD-10-CM | POA: Diagnosis present

## 2017-05-15 DIAGNOSIS — K219 Gastro-esophageal reflux disease without esophagitis: Secondary | ICD-10-CM | POA: Diagnosis present

## 2017-05-15 DIAGNOSIS — N99528 Other complication of other external stoma of urinary tract: Secondary | ICD-10-CM | POA: Diagnosis not present

## 2017-05-15 DIAGNOSIS — D72829 Elevated white blood cell count, unspecified: Secondary | ICD-10-CM | POA: Diagnosis not present

## 2017-05-15 DIAGNOSIS — Z681 Body mass index (BMI) 19 or less, adult: Secondary | ICD-10-CM | POA: Diagnosis not present

## 2017-05-15 DIAGNOSIS — Z932 Ileostomy status: Secondary | ICD-10-CM | POA: Diagnosis not present

## 2017-05-15 DIAGNOSIS — K5080 Crohn's disease of both small and large intestine without complications: Secondary | ICD-10-CM | POA: Diagnosis present

## 2017-05-15 DIAGNOSIS — K50819 Crohn's disease of both small and large intestine with unspecified complications: Secondary | ICD-10-CM | POA: Diagnosis not present

## 2017-05-15 DIAGNOSIS — Z79899 Other long term (current) drug therapy: Secondary | ICD-10-CM | POA: Diagnosis not present

## 2017-05-15 DIAGNOSIS — F329 Major depressive disorder, single episode, unspecified: Secondary | ICD-10-CM | POA: Diagnosis present

## 2017-05-15 LAB — CBC
HCT: 27 % — ABNORMAL LOW (ref 39.0–52.0)
HEMOGLOBIN: 8.7 g/dL — AB (ref 13.0–17.0)
MCH: 29.4 pg (ref 26.0–34.0)
MCHC: 32.2 g/dL (ref 30.0–36.0)
MCV: 91.2 fL (ref 78.0–100.0)
Platelets: 496 10*3/uL — ABNORMAL HIGH (ref 150–400)
RBC: 2.96 MIL/uL — AB (ref 4.22–5.81)
RDW: 19.3 % — ABNORMAL HIGH (ref 11.5–15.5)
WBC: 56.5 10*3/uL (ref 4.0–10.5)

## 2017-05-15 MED ORDER — FAMOTIDINE 20 MG PO TABS
20.0000 mg | ORAL_TABLET | Freq: Two times a day (BID) | ORAL | Status: DC
  Administered 2017-05-15 – 2017-05-17 (×4): 20 mg via ORAL
  Filled 2017-05-15 (×4): qty 1

## 2017-05-15 MED ORDER — ACETAMINOPHEN 500 MG PO TABS
1000.0000 mg | ORAL_TABLET | Freq: Once | ORAL | Status: AC
  Administered 2017-05-15: 1000 mg via ORAL
  Filled 2017-05-15: qty 2

## 2017-05-15 NOTE — Progress Notes (Signed)
Progress note  S: Pain in back improved.NAEON. No complaints this morning.   O: Vitals:   05/14/17 1849 05/14/17 2032 05/15/17 0231 05/15/17 0547  BP: 117/82 115/86 130/81 119/80  Pulse: 96 87 76 82  Resp: 14 16 16 16   Temp: 98.3 F (36.8 C) 98.7 F (37.1 C) 99.3 F (37.4 C) 98.5 F (36.9 C)  TempSrc: Oral Oral Oral Oral  SpO2: 99% 99% 98% 98%  Weight:      Height:       Abdomen is soft Neph tube draining straw colored urine Foley draining clear urine Flank incision site is dressed/clean   Recent Labs  05/14/17 1502 05/14/17 1821 05/15/17 0430  WBC 52.7* 57.7* 56.5*  HGB 9.5* 8.5* 8.7*  HCT 29.3* 25.7* 27.0*    Recent Labs  05/14/17 1502  NA 138  K 4.0  CL 115*  CO2 15*  GLUCOSE 160*  BUN 10  CREATININE 1.02  CALCIUM 7.3*   No results for input(s): LABPT, INR in the last 72 hours. No results for input(s): PSA in the last 72 hours. No results for input(s): LABURIN in the last 72 hours. Results for orders placed or performed during the hospital encounter of 04/28/17  Urine culture     Status: Abnormal   Collection Time: 04/28/17  5:28 PM  Result Value Ref Range Status   Specimen Description URINE, CLEAN CATCH  Final   Special Requests NONE  Final   Culture MULTIPLE SPECIES PRESENT, SUGGEST RECOLLECTION (A)  Final   Report Status 04/29/2017 FINAL  Final  Culture, blood (Routine X 2) w Reflex to ID Panel     Status: None   Collection Time: 04/29/17  8:03 AM  Result Value Ref Range Status   Specimen Description BLOOD LEFT HAND  Final   Special Requests   Final    BOTTLES DRAWN AEROBIC AND ANAEROBIC Blood Culture results may not be optimal due to an excessive volume of blood received in culture bottles   Culture NO GROWTH 5 DAYS  Final   Report Status 05/04/2017 FINAL  Final  Culture, blood (Routine X 2) w Reflex to ID Panel     Status: None   Collection Time: 04/29/17  8:03 AM  Result Value Ref Range Status   Specimen Description BLOOD RIGHT  ANTECUBITAL  Final   Special Requests   Final    BOTTLES DRAWN AEROBIC AND ANAEROBIC Blood Culture adequate volume   Culture NO GROWTH 5 DAYS  Final   Report Status 05/04/2017 FINAL  Final    A: 56 y.o. male stable from left PCNL. Doing well this AM with normal vitals, subjectively looks well, no complaining of pain or nausea. Leukocytosis to >50k persists. Will have GI see this morning.   P: - Neph tube removed this morning - Foley out in 3 hours. - Med-lock, regular diet - Consult GI for leukocytosis in the setting of Crohns and multiple GI meds - Home likely tomorrow

## 2017-05-15 NOTE — Progress Notes (Signed)
Pharmacy IV to PO conversion  The patient is receiving Pepcid and acetaminophen by the intravenous route.  Based on criteria approved by the Pharmacy and [facility], the medication is being converted to the equivalent oral dose form.   No active GI bleeding or impaired absorption  Not s/p esophagectomy  Documented ability to take oral medications for > 24 hr  Plan to continue treatment for at least 1 day  If you have any questions about this conversion, please contact the Pharmacy Department (ext [phone]).  Thank you.  Reuel Boom, PharmD Pager: [phone] 05/15/2017, 11:04 AM

## 2017-05-15 NOTE — Progress Notes (Addendum)
Patient c/o severe pain in bladder and left side flank pain. All medications were given that were available. Nephrostomy tube not draining much and foley not draining at all. Bladder scan revealed 527m. Dr. MJess Barterswith urology paged.   0428- Urology paged again. Verbal order to manually irrigate foley catheter received.

## 2017-05-15 NOTE — Progress Notes (Signed)
Progress note  S: Pain in back improved.NAEON. No complaints this morning.   O: Vitals:   05/14/17 1849 05/14/17 2032 05/15/17 0231 05/15/17 0547  BP: 117/82 115/86 130/81 119/80  Pulse: 96 87 76 82  Resp: 14 16 16 16   Temp: 98.3 F (36.8 C) 98.7 F (37.1 C) 99.3 F (37.4 C) 98.5 F (36.9 C)  TempSrc: Oral Oral Oral Oral  SpO2: 99% 99% 98% 98%  Weight:      Height:       Abdomen is soft Neph tube draining straw colored urine Foley draining clear urine Flank incision site is dressed/clean   Recent Labs  05/14/17 1502 05/14/17 1821 05/15/17 0430  WBC 52.7* 57.7* 56.5*  HGB 9.5* 8.5* 8.7*  HCT 29.3* 25.7* 27.0*    Recent Labs  05/14/17 1502  NA 138  K 4.0  CL 115*  CO2 15*  GLUCOSE 160*  BUN 10  CREATININE 1.02  CALCIUM 7.3*   No results for input(s): LABPT, INR in the last 72 hours. No results for input(s): PSA in the last 72 hours. No results for input(s): LABURIN in the last 72 hours. Results for orders placed or performed during the hospital encounter of 04/28/17  Urine culture     Status: Abnormal   Collection Time: 04/28/17  5:28 PM  Result Value Ref Range Status   Specimen Description URINE, CLEAN CATCH  Final   Special Requests NONE  Final   Culture MULTIPLE SPECIES PRESENT, SUGGEST RECOLLECTION (A)  Final   Report Status 04/29/2017 FINAL  Final  Culture, blood (Routine X 2) w Reflex to ID Panel     Status: None   Collection Time: 04/29/17  8:03 AM  Result Value Ref Range Status   Specimen Description BLOOD LEFT HAND  Final   Special Requests   Final    BOTTLES DRAWN AEROBIC AND ANAEROBIC Blood Culture results may not be optimal due to an excessive volume of blood received in culture bottles   Culture NO GROWTH 5 DAYS  Final   Report Status 05/04/2017 FINAL  Final  Culture, blood (Routine X 2) w Reflex to ID Panel     Status: None   Collection Time: 04/29/17  8:03 AM  Result Value Ref Range Status   Specimen Description BLOOD RIGHT  ANTECUBITAL  Final   Special Requests   Final    BOTTLES DRAWN AEROBIC AND ANAEROBIC Blood Culture adequate volume   Culture NO GROWTH 5 DAYS  Final   Report Status 05/04/2017 FINAL  Final    A: 56 y.o. male stable from left PCNL. Doing well this AM with normal vitals, subjectively looks well, no complaining of pain or nausea. Leukocytosis to >50k persists. Will have GI see this morning.  I note that no CT scan was ordered to evaluate for residual fragments.  With his markedly elevated white blood cell count I was going to obtain a CT scan of the kidneys however GI will be contacted in order to determine if they would benefit from imaging of both the abdomen and the pelvis and whether they feel he would benefit further from the contrast for the study.  P: - Neph tube removed this morning - Foley out in 3 hours. - Med-lock, regular diet - Consult GI for leukocytosis in the setting of Crohns and multiple GI meds - CT scan - Home likely tomorrow  Patient was seen, examined,treatment plan was discussed with the resident.  I have directly reviewed the clinical findings, lab,  imaging studies and management of this patient in detail. I have made the necessary changes and/or additions to the above noted documentation, and agree with the documentation, as recorded by the resident.

## 2017-05-16 LAB — BASIC METABOLIC PANEL
ANION GAP: 6 (ref 5–15)
BUN: 14 mg/dL (ref 6–20)
CALCIUM: 7.9 mg/dL — AB (ref 8.9–10.3)
CO2: 18 mmol/L — AB (ref 22–32)
Chloride: 112 mmol/L — ABNORMAL HIGH (ref 101–111)
Creatinine, Ser: 1.63 mg/dL — ABNORMAL HIGH (ref 0.61–1.24)
GFR calc Af Amer: 53 mL/min — ABNORMAL LOW (ref >60)
GFR, EST NON AFRICAN AMERICAN: 46 mL/min — AB (ref >60)
GLUCOSE: 97 mg/dL (ref 65–99)
Potassium: 4.8 mmol/L (ref 3.5–5.1)
Sodium: 136 mmol/L (ref 135–145)

## 2017-05-16 LAB — CBC
HEMATOCRIT: 23.4 % — AB (ref 39.0–52.0)
Hemoglobin: 7.9 g/dL — ABNORMAL LOW (ref 13.0–17.0)
MCH: 29.6 pg (ref 26.0–34.0)
MCHC: 33.8 g/dL (ref 30.0–36.0)
MCV: 87.6 fL (ref 78.0–100.0)
PLATELETS: 324 10*3/uL (ref 150–400)
RBC: 2.67 MIL/uL — ABNORMAL LOW (ref 4.22–5.81)
RDW: 19.8 % — AB (ref 11.5–15.5)
WBC: 31.4 10*3/uL — ABNORMAL HIGH (ref 4.0–10.5)

## 2017-05-16 MED ORDER — LIDOCAINE 5 % EX PTCH
1.0000 | MEDICATED_PATCH | CUTANEOUS | Status: DC
  Administered 2017-05-16 – 2017-05-17 (×2): 1 via TRANSDERMAL
  Filled 2017-05-16 (×3): qty 1

## 2017-05-16 MED ORDER — OXYCODONE HCL 5 MG PO TABS: 5.0000 mg | ORAL_TABLET | ORAL | 0 refills | Status: DC | PRN

## 2017-05-16 MED ORDER — GABAPENTIN 300 MG PO CAPS
300.0000 mg | ORAL_CAPSULE | Freq: Three times a day (TID) | ORAL | Status: DC
  Administered 2017-05-16 – 2017-05-17 (×5): 300 mg via ORAL
  Filled 2017-05-16 (×5): qty 1

## 2017-05-16 NOTE — Progress Notes (Addendum)
Progress note  S: Pain in back persists. Compains of some nausea.NAEON. No complaints this morning.   O: Vitals:   05/15/17 0231 05/15/17 0547 05/15/17 1427 05/15/17 2032  BP: 130/81 119/80 120/73 118/79  Pulse: 76 82 75 93  Resp: 16 16 18 18   Temp: 99.3 F (37.4 C) 98.5 F (36.9 C) 98.5 F (36.9 C) 98.3 F (36.8 C)  TempSrc: Oral Oral Oral Oral  SpO2: 98% 98% 98% 96%  Weight:      Height:       Abdomen is soft Former neph tube site dressed with gauze Foley out, voiding spontaneously, clear yellow urine in urinal.  Flank incision site is dressed/clean   Recent Labs  05/14/17 1502 05/14/17 1821 05/15/17 0430  WBC 52.7* 57.7* 56.5*  HGB 9.5* 8.5* 8.7*  HCT 29.3* 25.7* 27.0*    Recent Labs  05/14/17 1502  NA 138  K 4.0  CL 115*  CO2 15*  GLUCOSE 160*  BUN 10  CREATININE 1.02  CALCIUM 7.3*   No results for input(s): LABPT, INR in the last 72 hours. No results for input(s): PSA in the last 72 hours. No results for input(s): LABURIN in the last 72 hours. Results for orders placed or performed during the hospital encounter of 04/28/17  Urine culture     Status: Abnormal   Collection Time: 04/28/17  5:28 PM  Result Value Ref Range Status   Specimen Description URINE, CLEAN CATCH  Final   Special Requests NONE  Final   Culture MULTIPLE SPECIES PRESENT, SUGGEST RECOLLECTION (A)  Final   Report Status 04/29/2017 FINAL  Final  Culture, blood (Routine X 2) w Reflex to ID Panel     Status: None   Collection Time: 04/29/17  8:03 AM  Result Value Ref Range Status   Specimen Description BLOOD LEFT HAND  Final   Special Requests   Final    BOTTLES DRAWN AEROBIC AND ANAEROBIC Blood Culture results may not be optimal due to an excessive volume of blood received in culture bottles   Culture NO GROWTH 5 DAYS  Final   Report Status 05/04/2017 FINAL  Final  Culture, blood (Routine X 2) w Reflex to ID Panel     Status: None   Collection Time: 04/29/17  8:03 AM  Result  Value Ref Range Status   Specimen Description BLOOD RIGHT ANTECUBITAL  Final   Special Requests   Final    BOTTLES DRAWN AEROBIC AND ANAEROBIC Blood Culture adequate volume   Culture NO GROWTH 5 DAYS  Final   Report Status 05/04/2017 FINAL  Final    A: 56 y.o. male stable from left PCNL. Doing well this AM with normal vitals, subjectively looks well, no complaining of pain or nausea. Leukocytosis post-op, down to 30k today. Spoke with GI who relates this to a combination of post-op status and sterioid taper. Reassured by no abscess on CT. CT yesterday revealed no significant stone burden. Maybe a small stone in the ureter. Pain is the barrier to discharge.   P: - Normalized.  - D/C IV pain medication. Will adjust PO meds.  - Home likely tomorrow  Patient was seen, examined,treatment plan was discussed with the resident.  I have directly reviewed the clinical findings, lab, imaging studies and management of this patient in detail. I have made the necessary changes and/or additions to the above noted documentation, and agree with the documentation, as recorded by the resident.

## 2017-05-16 NOTE — Plan of Care (Signed)
Problem: Education: Goal: Knowledge of [facility] Education information/materials will improve Outcome: Completed/Met Date Met: 05/16/17 .  Problem: Health Behavior/Discharge Planning: Goal: Ability to manage health-related needs will improve Outcome: Progressing .  Problem: Pain Managment: Goal: General experience of comfort will improve Outcome: Not Progressing Pt c/o still being in pain.  Oxy IR given as MD discontinued Dilaudid.  Distraction helpful.  Will continue to monitor and continue with plan of care.   Problem: Physical Regulation: Goal: Ability to maintain clinical measurements within normal limits will improve Outcome: Progressing . Goal: Will remain free from infection Outcome: Progressing .  Problem: Skin Integrity: Goal: Risk for impaired skin integrity will decrease Outcome: Progressing .  Problem: Tissue Perfusion: Goal: Risk factors for ineffective tissue perfusion will decrease Outcome: Completed/Met Date Met: 05/16/17 .  Problem: Activity: Goal: Risk for activity intolerance will decrease Outcome: Progressing .  Problem: Fluid Volume: Goal: Ability to maintain a balanced intake and output will improve Outcome: Progressing .

## 2017-05-17 ENCOUNTER — Telehealth: Payer: Self-pay

## 2017-05-17 ENCOUNTER — Other Ambulatory Visit: Payer: Self-pay

## 2017-05-17 DIAGNOSIS — Z87891 Personal history of nicotine dependence: Secondary | ICD-10-CM | POA: Diagnosis not present

## 2017-05-17 DIAGNOSIS — S199XXA Unspecified injury of neck, initial encounter: Secondary | ICD-10-CM | POA: Diagnosis not present

## 2017-05-17 DIAGNOSIS — R278 Other lack of coordination: Secondary | ICD-10-CM | POA: Diagnosis not present

## 2017-05-17 DIAGNOSIS — N390 Urinary tract infection, site not specified: Secondary | ICD-10-CM | POA: Diagnosis not present

## 2017-05-17 DIAGNOSIS — Z681 Body mass index (BMI) 19 or less, adult: Secondary | ICD-10-CM | POA: Diagnosis not present

## 2017-05-17 DIAGNOSIS — K50819 Crohn's disease of both small and large intestine with unspecified complications: Secondary | ICD-10-CM | POA: Diagnosis not present

## 2017-05-17 DIAGNOSIS — K50818 Crohn's disease of both small and large intestine with other complication: Secondary | ICD-10-CM | POA: Diagnosis not present

## 2017-05-17 DIAGNOSIS — K912 Postsurgical malabsorption, not elsewhere classified: Secondary | ICD-10-CM | POA: Diagnosis present

## 2017-05-17 DIAGNOSIS — Z833 Family history of diabetes mellitus: Secondary | ICD-10-CM | POA: Diagnosis not present

## 2017-05-17 DIAGNOSIS — R2689 Other abnormalities of gait and mobility: Secondary | ICD-10-CM | POA: Diagnosis not present

## 2017-05-17 DIAGNOSIS — S0181XA Laceration without foreign body of other part of head, initial encounter: Secondary | ICD-10-CM | POA: Diagnosis not present

## 2017-05-17 DIAGNOSIS — J189 Pneumonia, unspecified organism: Secondary | ICD-10-CM | POA: Diagnosis not present

## 2017-05-17 DIAGNOSIS — D649 Anemia, unspecified: Secondary | ICD-10-CM | POA: Diagnosis not present

## 2017-05-17 DIAGNOSIS — Z880 Allergy status to penicillin: Secondary | ICD-10-CM | POA: Diagnosis not present

## 2017-05-17 DIAGNOSIS — Z808 Family history of malignant neoplasm of other organs or systems: Secondary | ICD-10-CM | POA: Diagnosis not present

## 2017-05-17 DIAGNOSIS — N400 Enlarged prostate without lower urinary tract symptoms: Secondary | ICD-10-CM | POA: Diagnosis not present

## 2017-05-17 DIAGNOSIS — Z888 Allergy status to other drugs, medicaments and biological substances status: Secondary | ICD-10-CM | POA: Diagnosis not present

## 2017-05-17 DIAGNOSIS — N136 Pyonephrosis: Secondary | ICD-10-CM | POA: Diagnosis present

## 2017-05-17 DIAGNOSIS — R918 Other nonspecific abnormal finding of lung field: Secondary | ICD-10-CM | POA: Diagnosis not present

## 2017-05-17 DIAGNOSIS — D72829 Elevated white blood cell count, unspecified: Secondary | ICD-10-CM

## 2017-05-17 DIAGNOSIS — K5090 Crohn's disease, unspecified, without complications: Secondary | ICD-10-CM | POA: Diagnosis present

## 2017-05-17 DIAGNOSIS — S0993XA Unspecified injury of face, initial encounter: Secondary | ICD-10-CM | POA: Diagnosis not present

## 2017-05-17 DIAGNOSIS — Z87442 Personal history of urinary calculi: Secondary | ICD-10-CM | POA: Diagnosis not present

## 2017-05-17 DIAGNOSIS — R531 Weakness: Secondary | ICD-10-CM | POA: Diagnosis not present

## 2017-05-17 DIAGNOSIS — F329 Major depressive disorder, single episode, unspecified: Secondary | ICD-10-CM | POA: Diagnosis present

## 2017-05-17 DIAGNOSIS — I959 Hypotension, unspecified: Secondary | ICD-10-CM | POA: Diagnosis present

## 2017-05-17 DIAGNOSIS — S098XXA Other specified injuries of head, initial encounter: Secondary | ICD-10-CM | POA: Diagnosis not present

## 2017-05-17 DIAGNOSIS — Z933 Colostomy status: Secondary | ICD-10-CM | POA: Diagnosis not present

## 2017-05-17 DIAGNOSIS — N99528 Other complication of other external stoma of urinary tract: Secondary | ICD-10-CM | POA: Diagnosis not present

## 2017-05-17 DIAGNOSIS — G8929 Other chronic pain: Secondary | ICD-10-CM | POA: Diagnosis not present

## 2017-05-17 DIAGNOSIS — Z8249 Family history of ischemic heart disease and other diseases of the circulatory system: Secondary | ICD-10-CM | POA: Diagnosis not present

## 2017-05-17 DIAGNOSIS — M6281 Muscle weakness (generalized): Secondary | ICD-10-CM | POA: Diagnosis not present

## 2017-05-17 DIAGNOSIS — S0990XA Unspecified injury of head, initial encounter: Secondary | ICD-10-CM | POA: Diagnosis not present

## 2017-05-17 DIAGNOSIS — R109 Unspecified abdominal pain: Secondary | ICD-10-CM | POA: Diagnosis not present

## 2017-05-17 DIAGNOSIS — D62 Acute posthemorrhagic anemia: Secondary | ICD-10-CM | POA: Diagnosis not present

## 2017-05-17 DIAGNOSIS — N200 Calculus of kidney: Secondary | ICD-10-CM | POA: Diagnosis not present

## 2017-05-17 DIAGNOSIS — K219 Gastro-esophageal reflux disease without esophagitis: Secondary | ICD-10-CM | POA: Diagnosis present

## 2017-05-17 DIAGNOSIS — Y95 Nosocomial condition: Secondary | ICD-10-CM | POA: Diagnosis present

## 2017-05-17 DIAGNOSIS — E43 Unspecified severe protein-calorie malnutrition: Secondary | ICD-10-CM | POA: Diagnosis present

## 2017-05-17 NOTE — Telephone Encounter (Signed)
Okay we can await his discharge and follow up with him at that time. Can you check on him in 2-3 days? Thanks

## 2017-05-17 NOTE — Evaluation (Signed)
Physical Therapy Evaluation Patient Details Name: Gregory [name]. MRN: [id number] DOB: 10-31-1960 Today's Date: 05/17/2017   History of Present Illness  pt is a 56 y/o male with pmh significant for endocarditis, Crohn's ds s/p colectomy and ileostomy, and most recently obstructing nephrolithiasis s/p nephrostomy placement on 8/13. Admitted 05/14/17 [place] a 56 y.o.year old admitted for clinic for nausea and vomiting and intermittent fevers. In clinic, the patient had a 102 fever and a heart rate of 132. generalized pain and weakness and lack of appetite. S/P cysto, removal  kidney stones, percutaneous renal abcess drain.  [facility] DC from hospital 05/02/17.  Clinical Impression  The patient is very lethargic, barely aroused to ambulate and required 2 assist with RW x 10'. The patient reports 7/10 pain. RN aware. Linens soaked from urine and/or drainage. CNA reports patient has been incontinent. The patient  Would not be able to care for self  In his current state of MS and functional mobility.. Recommend short term Rehab. Pt admitted with above diagnosis. Pt currently with functional limitations due to the deficits listed below (see PT Problem List).  Pt will benefit from skilled PT to increase their independence and safety with mobility to allow discharge to the venue listed below.       Follow Up Recommendations SNF;Supervision/Assistance - 24 hour    Equipment Recommendations  None recommended by PT    Recommendations for Other Services       Precautions / Restrictions Precautions Precautions: Fall Precaution Comments: very weak, draining from left posterior site, ileostomy. incontinent Restrictions Weight Bearing Restrictions: No      Mobility  Bed Mobility Overal bed mobility: Needs Assistance Bed Mobility: Supine to Sit     Supine to sit: Mod assist;HOB elevated     General bed mobility comments: slow and deliberate due to pain.  Transfers Overall transfer  level: Needs assistance Equipment used: Rolling walker (2 wheeled) Transfers: Sit to/from Stand Sit to Stand: Max assist;+2 safety/equipment;From elevated surface         General transfer comment: slow due  assist to power up from bed, head forward and knees flexed. eyes closed, does not make eye contact.  Ambulation/Gait Ambulation/Gait assistance: Max assist;+2 physical assistance;+2 safety/equipment Ambulation Distance (Feet): 15 Feet Assistive device: Rolling walker (2 wheeled) Gait Pattern/deviations: Step-to pattern;Staggering left;Staggering right;Trunk flexed;Narrow base of support;Decreased weight shift to left;Shuffle     General Gait Details: max assist for maintiang standing, knees flexed while ambulating short distance, head forward, trunk flexed. Multimodal cues to look forward , keep eyes open. required assistance to turn RW and steady gait.  Stairs            Wheelchair Mobility    Modified Rankin (Stroke Patients Only)       Balance Overall balance assessment: Needs assistance Sitting-balance support: Bilateral upper extremity supported;Feet supported Sitting balance-Leahy Scale: Poor Sitting balance - Comments: forward flexed   Standing balance support: During functional activity;Bilateral upper extremity supported Standing balance-Leahy Scale: Zero                               Pertinent Vitals/Pain Pain Assessment: 0-10 Pain Score: 7  Pain Location: back Pain Descriptors / Indicators: Discomfort;Dull;Grimacing;Guarding;Nagging;Moaning Pain Intervention(s): Premedicated before session;Monitored during session    Raymond expects to be discharged to:: Private residence Living Arrangements: Alone Available Help at Discharge: Family;Available PRN/intermittently Type of Home: Mobile home Home Access: Stairs to enter  Entrance Stairs-Rails: Right;Left Entrance Stairs-Number of Steps: 3-4 Home Layout: One level Home  Equipment: Cane - single point Additional Comments: info from previous encounter mostly due to patient is lethargic    Prior Function Level of Independence: Independent with assistive device(s)               Hand Dominance        Extremity/Trunk Assessment   Upper Extremity Assessment Upper Extremity Assessment: Generalized weakness    Lower Extremity Assessment Lower Extremity Assessment: Generalized weakness    Cervical / Trunk Assessment Cervical / Trunk Assessment: Other exceptions Cervical / Trunk Exceptions: head flexed forward  Communication   Communication: No difficulties (vouce very quiet)  Cognition Arousal/Alertness: Suspect due to medications;Lethargic Behavior During Therapy: Flat affect Overall Cognitive Status: Impaired/Different from baseline Area of Impairment: Orientation                 Orientation Level: Time                    General Comments      Exercises     Assessment/Plan    PT Assessment Patient needs continued PT services  PT Problem List Decreased strength;Decreased activity tolerance;Decreased balance;Decreased mobility;Pain;Decreased knowledge of use of DME       PT Treatment Interventions Gait training;Functional mobility training;Therapeutic activities;Balance training;Patient/family education;DME instruction    PT Goals (Current goals can be found in the Care Plan section)  Acute Rehab PT Goals Patient Stated Goal: did not state, did appear to agree to rehab when discussed with patient, patient is very lethargic PT Goal Formulation: Patient unable to participate in goal setting Time For Goal Achievement: 05/31/17 Potential to Achieve Goals: Fair    Frequency Min 3X/week   Barriers to discharge Decreased caregiver support      Co-evaluation               AM-PAC PT "6 Clicks" Daily Activity  Outcome Measure Difficulty turning over in bed (including adjusting bedclothes, sheets and blankets)?:  Unable Difficulty moving from lying on back to sitting on the side of the bed? : Unable Difficulty sitting down on and standing up from a chair with arms (e.g., wheelchair, bedside commode, etc,.)?: Unable Help needed moving to and from a bed to chair (including a wheelchair)?: Total Help needed walking in hospital room?: Total Help needed climbing 3-5 steps with a railing? : Total 6 Click Score: 6    End of Session Equipment Utilized During Treatment: Gait belt Activity Tolerance: Patient limited by fatigue;Patient limited by pain Patient left: in chair;with call bell/phone within reach;with chair alarm set Nurse Communication: Mobility status PT Visit Diagnosis: Unsteadiness on feet (R26.81);Muscle weakness (generalized) (M62.81);Pain Pain - Right/Left: Left    Time: 2297-9892 PT Time Calculation (min) (ACUTE ONLY): 35 min   Charges:   PT Evaluation $PT Eval Moderate Complexity: 1 Mod PT Treatments $Gait Training: 8-22 mins   PT G CodesTresa Endo PT [phone]   Claretha Cooper 05/17/2017, 9:25 AM

## 2017-05-17 NOTE — Progress Notes (Signed)
D/C instructions reviewed w/ pt sister. She verbalizes understanding and all questions answered. Sister in possession of d/c packet, script, and all personal belongings. Pt sent with 2 back up ileostomy 2-piece pouches/flanges and dsg supplies. Pt d/c in w/c in stable condition to sister's car by NT. Sister to transport pt to rehab.

## 2017-05-17 NOTE — Discharge Instructions (Signed)
Discharge instructions following PCNL  Call your doctor for: Fevers greater than 100.5 Severe nausea or vomiting Increasing pain not controlled by pain medication Increasing redness or drainage from incisions Decreased urine output or a catheter is no longer draining  The number for questions is [phone].  Activity: Gradually increase activity with short frequent walks, 3-4 times a day.  Avoid strenuous activities, like sports, lawn-mowing, or heavy lifting (more than 10-15 pounds).  Wear loose, comfortable clothing that pull or kink the tube or tubes.  Do not drive while taking pain medication, or until your doctor permitts it.  Bathing and dressing changes: You should not shower for 48 hours after surgery.  Do not soak your back in a bathtub.  Drainage bag care: You may be discharged with a drainage bag around the site of your surgery.  The drainage bag should be secured such that it never pulls or loosens to prevent it from leaking.  It is important to wash her hands before and after emptying the drainage bag to help prevent the spread of infection.  The drainage bag should be emptied as needed.  When the wound stops draining or it is manageable with a dry gauze dressing, you can remove the bag.  Diet: It is extremely important to drink plenty of fluids after surgery, especially water.  You may resume your regular diet, unless otherwise instructed.  Medications: May take Tylenol (acetaminophen) or ibuprofen (Advil, Motrin) as directed over-the-counter. Take any prescriptions as directed.  Follow-up appointments: Follow-up appointment will be scheduled with Dr. Louis Meckel in 10-14 days for hospital check and stent removal.

## 2017-05-17 NOTE — Telephone Encounter (Signed)
Called patient, he is currently in hospital. Patient very incoherent and unable to talk.

## 2017-05-17 NOTE — Telephone Encounter (Signed)
-----   Message from Yetta Flock, MD sent at 05/17/2017  7:15 AM EDT ----- Regarding: RE:  needs follow-up Thanks [name], Yes complicated guy. CT scan didn't look like much active inflammation of his bowel.   Almyra Free this patient should be on Entyvio at this point, can you help obtain a CBC for him at some point mid week to ensure stable? Also can you clarify how much prednisone he is on? Thanks   ----- Message ----- From: Irene Shipper, MD Sent: 05/16/2017   2:20 PM To: Yetta Flock, MD, Doristine Counter, RN Subject:  needs follow-up                                Gregory [name], Complicated Crohn's patient as you know... I was contacted by urology today regarding a markedly elevated white blood cell count. He  was admitted and underwent urologic procedures.  They wanted to send him home but wanted to touch base with GI first. No fevers. No GI complaints. They were wondering if the leukocytosis was related to his Crohn's or his current Crohn's medications.  Noncontrast scan did not show abscess.  Initial white blood cell count was 50 K.Subsequent blood count decrease to 30k.  He is on steroids. I recommended that we would have him come into the office this week for follow-up CBC. You may wish to see him as well for clinical overview as he is also on Biologics.  Thanks. Jenny Reichmann

## 2017-05-17 NOTE — Discharge Summary (Signed)
Date of admission: 05/14/2017  Date of discharge: 05/17/2017  Admission diagnosis: left large kidney stone  Discharge diagnosis: same  Secondary diagnoses:  Patient Active Problem List   Diagnosis Date Noted  . Protein-calorie malnutrition, severe 04/30/2017  . Anuresis 04/28/2017  . Physical deconditioning 04/28/2017  . Protein calorie malnutrition ([place]) 04/28/2017  . Abdominal pain, left upper quadrant 04/28/2017  . GERD (gastroesophageal reflux disease) 04/28/2017  . Nephrostomy complication ([place]) 37/85/8850  . Chronic pain 04/28/2017  . Depression 04/28/2017  . Nausea & vomiting   . Crohn's disease of small and large intestines with complication ([place])   . Nephrolithiasis 04/10/2017    Procedures performed: Procedure(s): LEFT NEPHROLITHOTOMY PERCUTANEOUS WITH SURGEON ACCESS  History and Physical: For full details, please see admission history and physical. Briefly, [name]. is a 56 y.o. year old patient with a large left sided kidney stone.  Hospital Course: Patient tolerated the procedure well.  He was then transferred to the floor after an uneventful PACU stay.  His hospital course was uncomplicated.  On POD#3 he had met discharge criteria: was eating a regular diet, was up and ambulating independently,  pain was well controlled, was voiding without a catheter, and was ready to for discharge.  His discharge was delayed by a leukocytosis which improved and was thought to be due to surgery and his steroid taper.  His pain was also slow to improve.  However, he was effectively treated using oxycodone.   Laboratory values:   Recent Labs  05/14/17 1821 05/15/17 0430 05/16/17 0726  WBC 57.7* 56.5* 31.4*  HGB 8.5* 8.7* 7.9*  HCT 25.7* 27.0* 23.4*    Recent Labs  05/16/17 0726  NA 136  K 4.8  CL 112*  CO2 18*  GLUCOSE 97  BUN 14  CREATININE 1.63*  CALCIUM 7.9*   No results for input(s): LABPT, INR in the last 72 hours. No results for input(s):  LABURIN in the last 72 hours. Results for orders placed or performed during the hospital encounter of 04/28/17  Urine culture     Status: Abnormal   Collection Time: 04/28/17  5:28 PM  Result Value Ref Range Status   Specimen Description URINE, CLEAN CATCH  Final   Special Requests NONE  Final   Culture MULTIPLE SPECIES PRESENT, SUGGEST RECOLLECTION (A)  Final   Report Status 04/29/2017 FINAL  Final  Culture, blood (Routine X 2) w Reflex to ID Panel     Status: None   Collection Time: 04/29/17  8:03 AM  Result Value Ref Range Status   Specimen Description BLOOD LEFT HAND  Final   Special Requests   Final    BOTTLES DRAWN AEROBIC AND ANAEROBIC Blood Culture results may not be optimal due to an excessive volume of blood received in culture bottles   Culture NO GROWTH 5 DAYS  Final   Report Status 05/04/2017 FINAL  Final  Culture, blood (Routine X 2) w Reflex to ID Panel     Status: None   Collection Time: 04/29/17  8:03 AM  Result Value Ref Range Status   Specimen Description BLOOD RIGHT ANTECUBITAL  Final   Special Requests   Final    BOTTLES DRAWN AEROBIC AND ANAEROBIC Blood Culture adequate volume   Culture NO GROWTH 5 DAYS  Final   Report Status 05/04/2017 FINAL  Final    Disposition: Home  Discharge instruction: The patient was instructed to be ambulatory but told to refrain from heavy lifting, strenuous activity, or driving.  Discharge medications: Allergies as of 05/17/2017      Reactions   Methotrexate Derivatives Other (See Comments)   Anemia, low WBC, severe GI symptoms,    Humira [adalimumab] Other (See Comments)   Intolerance   Penicillins Other (See Comments)   Has patient had a PCN reaction causing immediate rash, facial/tongue/throat swelling, SOB or lightheadedness with hypotension: Unknown Has patient had a PCN reaction causing severe rash involving mucus membranes or skin necrosis: Unknown Has patient had a PCN reaction that required hospitalization:  Unknown Has patient had a PCN reaction occurring within the last 10 years: No If all of the above answers are "NO", then may proceed with Cephalosporin use.   Remicade [infliximab] Other (See Comments)   Intolerance      Medication List    TAKE these medications   cholecalciferol 400 units Tabs tablet Commonly known as:  VITAMIN D Take 400 Units by mouth daily.   ciprofloxacin 500 MG tablet Commonly known as:  CIPRO Take 1 tablet (500 mg total) by mouth 2 (two) times daily. X 2 weeks   diphenoxylate-atropine 2.5-0.025 MG tablet Commonly known as:  LOMOTIL Take 1 tablet by mouth 4 (four) times daily.   famotidine 20 MG tablet Commonly known as:  PEPCID Take 1 tablet (20 mg total) by mouth at bedtime.   feeding supplement (PRO-STAT SUGAR FREE 64) Liqd Take 30 mLs by mouth 2 (two) times daily.   folic acid 1 MG tablet Commonly known as:  FOLVITE Take 1 tablet (1 mg total) by mouth daily.   midodrine 10 MG tablet Commonly known as:  PROAMATINE Take 1 tablet (10 mg total) by mouth 2 (two) times daily with a meal.   multivitamin with minerals Tabs tablet Take 1 tablet by mouth daily.   ondansetron 4 MG disintegrating tablet Commonly known as:  ZOFRAN-ODT Take 1 tablet (4 mg total) by mouth every 8 (eight) hours as needed for nausea or vomiting.   oxyCODONE 5 MG immediate release tablet Commonly known as:  Oxy IR/ROXICODONE Take 1-2 tablets (5-10 mg total) by mouth every 4 (four) hours as needed for severe pain.   potassium chloride SA 20 MEQ tablet Commonly known as:  K-DUR,KLOR-CON Take 1 tablet (20 mEq total) by mouth as directed. What changed:  when to take this  additional instructions   ranitidine 150 MG tablet Commonly known as:  ZANTAC Take 1 tablet (150 mg total) by mouth 2 (two) times daily.   sertraline 50 MG tablet Commonly known as:  ZOLOFT Take 1 tablet (50 mg total) by mouth daily.   sodium chloride flush 0.9 % Soln Commonly known as:  NS 5  mLs by Other route every 8 (eight) hours.   tamsulosin 0.4 MG Caps capsule Commonly known as:  FLOMAX Take 0.4 mg by mouth daily.            Discharge Care Instructions        Start     Ordered   05/16/17 0000  oxyCODONE (OXY IR/ROXICODONE) 5 MG immediate release tablet  Every 4 hours PRN     05/16/17 1158      Followup:   Contact information for follow-up providers    [name], MD On 05/24/2017.   Specialty:  Urology Why:  2pm, Stent removal, For suture removal Contact information: [address] [place] [address] [phone]        [facility] In 1 week.   Why:  Leukocystosis, abdomainl pain  Contact information for after-discharge care    Destination    [facility] .   Specialty:  [place] information: [address] [place] [phone]

## 2017-05-17 NOTE — NC FL2 (Signed)
[place] LEVEL OF CARE SCREENING TOOL     IDENTIFICATION  Patient Name: Gregory [name]. Birthdate: 11-Jun-1961 Sex: male Admission Date (Current Location): 05/14/2017  [facility] and [place] Number:  Herbalist and Address:  [facility],  [place], [place]      Provider Number: [id number]  Attending Physician Name and Address:  [name], MD  Relative Name and Phone Number:       Current Level of Care: Hospital Recommended Level of Care: [place] Prior Approval Number:    Date Approved/Denied:   PASRR Number: [id number]  Discharge Plan: SNF    Current Diagnoses: Patient Active Problem List   Diagnosis Date Noted  . Protein-calorie malnutrition, severe 04/30/2017  . Anuresis 04/28/2017  . Physical deconditioning 04/28/2017  . Protein calorie malnutrition ([place]) 04/28/2017  . Abdominal pain, left upper quadrant 04/28/2017  . GERD (gastroesophageal reflux disease) 04/28/2017  . Nephrostomy complication ([place]) 65/68/1275  . Chronic pain 04/28/2017  . Depression 04/28/2017  . Nausea & vomiting   . Crohn's disease of small and large intestines with complication ([place])   . Nephrolithiasis 04/10/2017    Orientation RESPIRATION BLADDER Height & Weight     Self, Time, Situation, Place  O2 (2L) Indwelling catheter Weight: 111 lb 8 oz (50.6 kg) Height:  5' 11"  (180.3 cm)  BEHAVIORAL SYMPTOMS/MOOD NEUROLOGICAL BOWEL NUTRITION STATUS      Ileostomy Diet (regular diet thin lfuid consistency)  AMBULATORY STATUS COMMUNICATION OF NEEDS Skin   Extensive Assist Verbally Surgical wounds (closed, location back)                       Personal Care Assistance Level of Assistance  Bathing, Feeding, Dressing Bathing Assistance: Limited assistance Feeding assistance: Independent Dressing Assistance: Independent     Functional Limitations Info  Sight, Hearing, Speech Sight Info: Adequate Hearing  Info: Adequate Speech Info: Adequate    SPECIAL CARE FACTORS FREQUENCY  PT (By licensed PT), OT (By licensed OT)     PT Frequency: 5x OT Frequency: 5x            Contractures Contractures Info: Not present    Additional Factors Info  Code Status, Allergies Code Status Info: full code Allergies Info:  Methotrexate Derivatives, Humira Adalimumab, Penicillins, Remicade Infliximab           Current Medications (05/17/2017):  This is the current hospital active medication list Current Facility-Administered Medications  Medication Dose Route Frequency Provider Last Rate Last Dose  . cholecalciferol (VITAMIN D) tablet 400 Units  400 Units Oral Daily [name], MD   400 Units at 05/17/17 [phone]  . ciprofloxacin (CIPRO) tablet 500 mg  500 mg Oral BID [name], MD   500 mg at 05/17/17 1749  . diazepam (VALIUM) tablet 10 mg  10 mg Oral Q6H PRN [name], MD   10 mg at 05/17/17 0550  . diphenoxylate-atropine (LOMOTIL) 2.5-0.025 MG per tablet 1 tablet  1 tablet Oral QID [name], MD   1 tablet at 05/17/17 0916  . famotidine (PEPCID) tablet 20 mg  20 mg Oral BID Polly Cobia, RPH   20 mg at 05/17/17 0916  . feeding supplement (ENSURE ENLIVE) (ENSURE ENLIVE) liquid 237 mL  237 mL Oral BID BM [name], MD      . feeding supplement (PRO-STAT SUGAR FREE 64) liquid 30 mL  30 mL Oral BID Louis Meckel,  Viona Gilmore, MD   30 mL at 05/17/17 0916  . folic acid (FOLVITE) tablet 1 mg  1 mg Oral Daily [name], MD   1 mg at 05/17/17 0916  . gabapentin (NEURONTIN) capsule 300 mg  300 mg Oral TID Stasia Cavalier, MD   300 mg at 05/17/17 0916  . lidocaine (LIDODERM) 5 % 1 patch  1 patch Transdermal Q24H Stasia Cavalier, MD   1 patch at 05/16/17 1358  . midodrine (PROAMATINE) tablet 10 mg  10 mg Oral BID WC [name], MD   10 mg at 05/17/17 4599  . multivitamin with minerals tablet 1 tablet  1 tablet Oral Daily [name], MD   1 tablet at 05/17/17 [phone]  . ondansetron (ZOFRAN-ODT) disintegrating tablet 4 mg  4 mg Oral Q8H PRN [name], MD   4 mg at 05/16/17 1124  . oxyCODONE (Oxy IR/ROXICODONE) immediate release tablet 5-10 mg  5-10 mg Oral Q4H PRN [name], MD   10 mg at 05/17/17 4239  . potassium chloride SA (K-DUR,KLOR-CON) CR tablet 20 mEq  20 mEq Oral Once per day on Mon Thu Herrick, Benjamin W, MD   20 mEq at 05/17/17 5320  . sertraline (ZOLOFT) tablet 50 mg  50 mg Oral Daily [name], MD   50 mg at 05/17/17 0916  . tamsulosin (FLOMAX) capsule 0.4 mg  0.4 mg Oral Daily [name], MD   0.4 mg at 05/17/17 2334     Discharge Medications: Please see discharge summary for a list of discharge medications.  Relevant Imaging Results:  Relevant Lab Results:   Additional Information SS# [id number]  Nila Nephew, LCSW

## 2017-05-17 NOTE — Clinical Social Work Placement (Signed)
Pt discharging today to [facility] for ST rehab.  Report # [phone] Facility selected by pt/sister after being provided bed offers. CSW selected facility in [place], will send DC summary including medications once available.  Pt's sister is transporting pt, and she will go to [facility] to complete admission paperwork for pt prior to coming to hospital to transport pt.  See below for placement details  CLINICAL SOCIAL WORK PLACEMENT  NOTE  Date:  05/17/2017  Patient Details  Name: Gregory [name]. MRN: [id number] Date of Birth: 04-24-1961  Clinical Social Work is seeking post-discharge placement for this patient at the [place] level of care (*CSW will initial, date and re-position this form in  chart as items are completed):  Yes   Patient/family provided with [place] Work Department's list of facilities offering this level of care within the geographic area requested by the patient (or if unable, by the patient's family).  Yes   Patient/family informed of their freedom to choose among providers that offer the needed level of care, that participate in Medicare, Medicaid or managed care program needed by the patient, have an available bed and are willing to accept the patient.  Yes   Patient/family informed of 's ownership interest in [facility] and [facility], as well as of the fact that they are under no obligation to receive care at these facilities.  PASRR submitted to EDS on       PASRR number received on 05/17/17     Existing PASRR number confirmed on       FL2 transmitted to all facilities in geographic area requested by pt/family on 05/17/17     FL2 transmitted to all facilities within larger geographic area on       Patient informed that his/her managed care company has contracts with or will negotiate with certain facilities, including the following:        Yes   Patient/family informed of bed offers  received.  Patient chooses bed at [facility]     Physician recommends and patient chooses bed at [facility]    Patient to be transferred to [facility] on 05/17/17.  Patient to be transferred to facility by family     Patient family notified on 05/17/17 of transfer.  Name of family member notified:  Neoma Laming, sister     PHYSICIAN Please prepare priority discharge summary, including medications, Please prepare prescriptions, Please sign FL2     Additional Comment:    _______________________________________________ Nila Nephew, LCSW 05/17/2017, 2:13 PM  [phone]

## 2017-05-17 NOTE — Progress Notes (Signed)
Report given to [place] at [facility]. All questions answered. Contact number provided.

## 2017-05-17 NOTE — Clinical Social Work Note (Signed)
Clinical Social Work Assessment  Patient Details  Name: Gregory [name]. MRN: [id number] Date of Birth: [birth year]  Date of referral:  05/17/17               Reason for consult:  Facility Placement                Permission sought to share information with:  Family Supports Permission granted to share information::  Yes, Verbal Permission Granted  Name::     Sister Tax adviser::     Relationship::     Contact Information:     Housing/Transportation Living arrangements for the past 2 months:  [place] of Information:  Patient (sister) Patient Interpreter Needed:  None Criminal Activity/Legal Involvement Pertinent to Current Situation/Hospitalization:  No - Comment as needed Significant Relationships:  Other Family Members Lives with:  Self Do you feel safe going back to the place where you live?  Yes Need for family participation in patient care:  No (Coment)  Care giving concerns:  Pt from home where he resides alone. Has family in area who are highly supportive. Walks with cane "sometimes" at baseline but otherwise independent with all daily needs. Currently requiring assistance to ambulate 15 ft with walker- family not available to assist him at the level in his home upon [employer]   Social Worker assessment / plan:  CSW consulted to assist with SNF placement. Met with pt at bedside- pt engaged while drowsy. Reports he feels ST rehab is appropriate for him after discussing recommendation with CSW. Lives in [place] but sister lives in [place] so agreed to referrals in both areas.  Obtained Pass[place], completed FL2 and referred to area SNFs.  Plan: ST rehab at SNF upon DC. Will follow up with bed offers today.   Employment status:  Disabled (Comment on whether or not currently receiving Disability) (receives disability) Insurance information:  Medicare PT Recommendations:  [place], [place] / Referral to community  resources:  [place]  Patient/Family's Response to care:  Engaged and appreciative of care  Patient/Family's Understanding of and Emotional Response to Diagnosis, Current Treatment, and Prognosis:  Both pt and family demonstrate adequate understanding of treatment received and of DC plan. Pt's emotional response was minimal- he was drowsy but pleasant. Sister highly positive about plan.   Emotional Assessment Appearance:  Appears older than stated age Attitude/Demeanor/Rapport:   (cooperative/drowsy) Affect (typically observed):  Calm, Accepting Orientation:  Oriented to Self, Oriented to Place, Oriented to  Time, Oriented to Situation Alcohol / Substance use:  Not Applicable Psych involvement (Current and /or in the community):  No (Comment)  Discharge Needs  Concerns to be addressed:  No discharge needs identified Readmission within the last 30 days:  Yes Current discharge risk:  None Barriers to Discharge:  No Barriers Identified   Nila Nephew, LCSW 05/17/2017, 10:49 AM [phone]

## 2017-05-17 NOTE — Progress Notes (Signed)
Progress note  S: Pain in back persists, but better. Compains of some nausea.NAEON. No complaints this morning.   O: Vitals:   05/15/17 1427 05/15/17 2032 05/16/17 2056 05/17/17 0548  BP: 120/73 118/79 119/73 (!) 90/55  Pulse: 75 93 (!) 107 99  Resp: 18 18 20 16   Temp: 98.5 F (36.9 C) 98.3 F (36.8 C) 99 F (37.2 C) 98.2 F (36.8 C)  TempSrc: Oral Oral Oral Oral  SpO2: 98% 96% 99% 93%  Weight:      Height:       Abdomen is soft Former neph tube site dressed with gauze Foley - condom cath.  Flank incision site is dressed/clean   Recent Labs  05/14/17 1821 05/15/17 0430 05/16/17 0726  WBC 57.7* 56.5* 31.4*  HGB 8.5* 8.7* 7.9*  HCT 25.7* 27.0* 23.4*    Recent Labs  05/14/17 1502 05/16/17 0726  NA 138 136  K 4.0 4.8  CL 115* 112*  CO2 15* 18*  GLUCOSE 160* 97  BUN 10 14  CREATININE 1.02 1.63*  CALCIUM 7.3* 7.9*   No results for input(s): LABPT, INR in the last 72 hours. No results for input(s): PSA in the last 72 hours. No results for input(s): LABURIN in the last 72 hours. Results for orders placed or performed during the hospital encounter of 04/28/17  Urine culture     Status: Abnormal   Collection Time: 04/28/17  5:28 PM  Result Value Ref Range Status   Specimen Description URINE, CLEAN CATCH  Final   Special Requests NONE  Final   Culture MULTIPLE SPECIES PRESENT, SUGGEST RECOLLECTION (A)  Final   Report Status 04/29/2017 FINAL  Final  Culture, blood (Routine X 2) w Reflex to ID Panel     Status: None   Collection Time: 04/29/17  8:03 AM  Result Value Ref Range Status   Specimen Description BLOOD LEFT HAND  Final   Special Requests   Final    BOTTLES DRAWN AEROBIC AND ANAEROBIC Blood Culture results may not be optimal due to an excessive volume of blood received in culture bottles   Culture NO GROWTH 5 DAYS  Final   Report Status 05/04/2017 FINAL  Final  Culture, blood (Routine X 2) w Reflex to ID Panel     Status: None   Collection Time:  04/29/17  8:03 AM  Result Value Ref Range Status   Specimen Description BLOOD RIGHT ANTECUBITAL  Final   Special Requests   Final    BOTTLES DRAWN AEROBIC AND ANAEROBIC Blood Culture adequate volume   Culture NO GROWTH 5 DAYS  Final   Report Status 05/04/2017 FINAL  Final    A: 56 y.o. male stable from left PCNL. Doing well this AM with normal vitals, subjectively looks well, no complaining of nausea. Leukocytosis post-op but clinically improving and insignificant.  Pain improving.  P: - Needs to ambulate with PT today - assess for home needs. - d/c foley   Hopefully we can get him home today.

## 2017-05-18 ENCOUNTER — Emergency Department (HOSPITAL_COMMUNITY): Payer: Medicare Other

## 2017-05-18 ENCOUNTER — Encounter (HOSPITAL_COMMUNITY): Payer: Self-pay | Admitting: Emergency Medicine

## 2017-05-18 ENCOUNTER — Inpatient Hospital Stay (HOSPITAL_COMMUNITY)
Admission: EM | Admit: 2017-05-18 | Discharge: 2017-05-22 | DRG: 689 | Disposition: A | Discharge status: Skilled Nursing Facility | Payer: Medicare Other | Attending: Internal Medicine | Admitting: Internal Medicine

## 2017-05-18 DIAGNOSIS — K5090 Crohn's disease, unspecified, without complications: Secondary | ICD-10-CM | POA: Diagnosis present

## 2017-05-18 DIAGNOSIS — F329 Major depressive disorder, single episode, unspecified: Secondary | ICD-10-CM | POA: Diagnosis present

## 2017-05-18 DIAGNOSIS — Z933 Colostomy status: Secondary | ICD-10-CM | POA: Diagnosis not present

## 2017-05-18 DIAGNOSIS — Z808 Family history of malignant neoplasm of other organs or systems: Secondary | ICD-10-CM | POA: Diagnosis not present

## 2017-05-18 DIAGNOSIS — I959 Hypotension, unspecified: Secondary | ICD-10-CM | POA: Diagnosis present

## 2017-05-18 DIAGNOSIS — N390 Urinary tract infection, site not specified: Secondary | ICD-10-CM | POA: Diagnosis not present

## 2017-05-18 DIAGNOSIS — R531 Weakness: Secondary | ICD-10-CM | POA: Diagnosis not present

## 2017-05-18 DIAGNOSIS — Z681 Body mass index (BMI) 19 or less, adult: Secondary | ICD-10-CM | POA: Diagnosis not present

## 2017-05-18 DIAGNOSIS — K50818 Crohn's disease of both small and large intestine with other complication: Secondary | ICD-10-CM | POA: Diagnosis not present

## 2017-05-18 DIAGNOSIS — Z87442 Personal history of urinary calculi: Secondary | ICD-10-CM | POA: Diagnosis not present

## 2017-05-18 DIAGNOSIS — S0993XA Unspecified injury of face, initial encounter: Secondary | ICD-10-CM | POA: Diagnosis not present

## 2017-05-18 DIAGNOSIS — D649 Anemia, unspecified: Secondary | ICD-10-CM

## 2017-05-18 DIAGNOSIS — N200 Calculus of kidney: Secondary | ICD-10-CM

## 2017-05-18 DIAGNOSIS — N99528 Other complication of other external stoma of urinary tract: Secondary | ICD-10-CM | POA: Diagnosis not present

## 2017-05-18 DIAGNOSIS — D539 Nutritional anemia, unspecified: Secondary | ICD-10-CM | POA: Diagnosis present

## 2017-05-18 DIAGNOSIS — R109 Unspecified abdominal pain: Secondary | ICD-10-CM | POA: Diagnosis not present

## 2017-05-18 DIAGNOSIS — Z87891 Personal history of nicotine dependence: Secondary | ICD-10-CM | POA: Diagnosis not present

## 2017-05-18 DIAGNOSIS — K50819 Crohn's disease of both small and large intestine with unspecified complications: Secondary | ICD-10-CM

## 2017-05-18 DIAGNOSIS — E43 Unspecified severe protein-calorie malnutrition: Secondary | ICD-10-CM | POA: Diagnosis present

## 2017-05-18 DIAGNOSIS — N136 Pyonephrosis: Secondary | ICD-10-CM | POA: Diagnosis present

## 2017-05-18 DIAGNOSIS — Z8249 Family history of ischemic heart disease and other diseases of the circulatory system: Secondary | ICD-10-CM | POA: Diagnosis not present

## 2017-05-18 DIAGNOSIS — J189 Pneumonia, unspecified organism: Secondary | ICD-10-CM

## 2017-05-18 DIAGNOSIS — S0181XA Laceration without foreign body of other part of head, initial encounter: Secondary | ICD-10-CM | POA: Diagnosis not present

## 2017-05-18 DIAGNOSIS — Z833 Family history of diabetes mellitus: Secondary | ICD-10-CM | POA: Diagnosis not present

## 2017-05-18 DIAGNOSIS — K219 Gastro-esophageal reflux disease without esophagitis: Secondary | ICD-10-CM | POA: Diagnosis present

## 2017-05-18 DIAGNOSIS — D62 Acute posthemorrhagic anemia: Secondary | ICD-10-CM | POA: Diagnosis not present

## 2017-05-18 DIAGNOSIS — K912 Postsurgical malabsorption, not elsewhere classified: Secondary | ICD-10-CM | POA: Diagnosis present

## 2017-05-18 DIAGNOSIS — F32A Depression, unspecified: Secondary | ICD-10-CM | POA: Diagnosis present

## 2017-05-18 DIAGNOSIS — R2689 Other abnormalities of gait and mobility: Secondary | ICD-10-CM | POA: Diagnosis not present

## 2017-05-18 DIAGNOSIS — Z880 Allergy status to penicillin: Secondary | ICD-10-CM | POA: Diagnosis not present

## 2017-05-18 DIAGNOSIS — Y95 Nosocomial condition: Secondary | ICD-10-CM | POA: Diagnosis present

## 2017-05-18 DIAGNOSIS — N400 Enlarged prostate without lower urinary tract symptoms: Secondary | ICD-10-CM | POA: Diagnosis not present

## 2017-05-18 DIAGNOSIS — R918 Other nonspecific abnormal finding of lung field: Secondary | ICD-10-CM | POA: Diagnosis not present

## 2017-05-18 DIAGNOSIS — G8929 Other chronic pain: Secondary | ICD-10-CM | POA: Diagnosis not present

## 2017-05-18 DIAGNOSIS — N1330 Unspecified hydronephrosis: Secondary | ICD-10-CM | POA: Diagnosis not present

## 2017-05-18 DIAGNOSIS — R609 Edema, unspecified: Secondary | ICD-10-CM | POA: Diagnosis not present

## 2017-05-18 DIAGNOSIS — Z888 Allergy status to other drugs, medicaments and biological substances status: Secondary | ICD-10-CM

## 2017-05-18 DIAGNOSIS — K5080 Crohn's disease of both small and large intestine without complications: Secondary | ICD-10-CM | POA: Diagnosis present

## 2017-05-18 DIAGNOSIS — I82409 Acute embolism and thrombosis of unspecified deep veins of unspecified lower extremity: Secondary | ICD-10-CM

## 2017-05-18 DIAGNOSIS — S0990XA Unspecified injury of head, initial encounter: Secondary | ICD-10-CM | POA: Diagnosis not present

## 2017-05-18 DIAGNOSIS — R278 Other lack of coordination: Secondary | ICD-10-CM | POA: Diagnosis not present

## 2017-05-18 DIAGNOSIS — S098XXA Other specified injuries of head, initial encounter: Secondary | ICD-10-CM | POA: Diagnosis not present

## 2017-05-18 DIAGNOSIS — Z466 Encounter for fitting and adjustment of urinary device: Secondary | ICD-10-CM | POA: Diagnosis not present

## 2017-05-18 DIAGNOSIS — M6281 Muscle weakness (generalized): Secondary | ICD-10-CM | POA: Diagnosis not present

## 2017-05-18 DIAGNOSIS — S199XXA Unspecified injury of neck, initial encounter: Secondary | ICD-10-CM | POA: Diagnosis not present

## 2017-05-18 HISTORY — DX: Calculus of kidney: N20.0

## 2017-05-18 LAB — COMPREHENSIVE METABOLIC PANEL
ALT: 27 U/L (ref 17–63)
ANION GAP: 8 (ref 5–15)
AST: 40 U/L (ref 15–41)
Albumin: 1.7 g/dL — ABNORMAL LOW (ref 3.5–5.0)
Alkaline Phosphatase: 121 U/L (ref 38–126)
BUN: 16 mg/dL (ref 6–20)
CALCIUM: 8.2 mg/dL — AB (ref 8.9–10.3)
CHLORIDE: 106 mmol/L (ref 101–111)
CO2: 22 mmol/L (ref 22–32)
CREATININE: 1.26 mg/dL — AB (ref 0.61–1.24)
Glucose, Bld: 112 mg/dL — ABNORMAL HIGH (ref 65–99)
POTASSIUM: 3.5 mmol/L (ref 3.5–5.1)
SODIUM: 136 mmol/L (ref 135–145)
TOTAL PROTEIN: 4.9 g/dL — AB (ref 6.5–8.1)
Total Bilirubin: 0.7 mg/dL (ref 0.3–1.2)

## 2017-05-18 LAB — URINALYSIS, ROUTINE W REFLEX MICROSCOPIC
BILIRUBIN URINE: NEGATIVE
Glucose, UA: NEGATIVE mg/dL
Ketones, ur: NEGATIVE mg/dL
NITRITE: NEGATIVE
PROTEIN: 30 mg/dL — AB
Specific Gravity, Urine: 1.015 (ref 1.005–1.030)
Squamous Epithelial / LPF: NONE SEEN
pH: 5 (ref 5.0–8.0)

## 2017-05-18 LAB — CBC WITH DIFFERENTIAL/PLATELET
BASOS PCT: 0 %
Basophils Absolute: 0 10*3/uL (ref 0.0–0.1)
EOS ABS: 0 10*3/uL (ref 0.0–0.7)
Eosinophils Relative: 0 %
HCT: 20.1 % — ABNORMAL LOW (ref 39.0–52.0)
Hemoglobin: 6.7 g/dL — CL (ref 13.0–17.0)
LYMPHS ABS: 0.6 10*3/uL — AB (ref 0.7–4.0)
Lymphocytes Relative: 4 %
MCH: 28.9 pg (ref 26.0–34.0)
MCHC: 33.3 g/dL (ref 30.0–36.0)
MCV: 86.6 fL (ref 78.0–100.0)
MONO ABS: 1.9 10*3/uL — AB (ref 0.1–1.0)
Monocytes Relative: 12 %
NEUTROS PCT: 84 %
Neutro Abs: 13.7 10*3/uL — ABNORMAL HIGH (ref 1.7–7.7)
PLATELETS: 318 10*3/uL (ref 150–400)
RBC: 2.32 MIL/uL — ABNORMAL LOW (ref 4.22–5.81)
RDW: 19 % — AB (ref 11.5–15.5)
WBC: 16.2 10*3/uL — ABNORMAL HIGH (ref 4.0–10.5)

## 2017-05-18 LAB — I-STAT TROPONIN, ED: Troponin i, poc: 0.03 ng/mL (ref 0.00–0.08)

## 2017-05-18 LAB — PROTIME-INR
INR: 1.18
PROTHROMBIN TIME: 14.9 s (ref 11.4–15.2)

## 2017-05-18 LAB — I-STAT CG4 LACTIC ACID, ED: LACTIC ACID, VENOUS: 0.56 mmol/L (ref 0.5–1.9)

## 2017-05-18 LAB — PREPARE RBC (CROSSMATCH)

## 2017-05-18 LAB — ABO/RH: ABO/RH(D): O POS

## 2017-05-18 MED ORDER — MORPHINE SULFATE (PF) 2 MG/ML IV SOLN
2.0000 mg | INTRAVENOUS | Status: DC | PRN
  Administered 2017-05-18: 2 mg via INTRAVENOUS
  Filled 2017-05-18: qty 1

## 2017-05-18 MED ORDER — FOLIC ACID 1 MG PO TABS
1.0000 mg | ORAL_TABLET | Freq: Every day | ORAL | Status: DC
  Administered 2017-05-19 – 2017-05-22 (×4): 1 mg via ORAL
  Filled 2017-05-18 (×4): qty 1

## 2017-05-18 MED ORDER — SODIUM CHLORIDE 0.9 % IV SOLN
Freq: Once | INTRAVENOUS | Status: AC
  Administered 2017-05-18: 18:00:00 via INTRAVENOUS

## 2017-05-18 MED ORDER — DEXTROSE 5 % IV SOLN
1.0000 g | Freq: Two times a day (BID) | INTRAVENOUS | Status: DC
  Administered 2017-05-19: 1 g via INTRAVENOUS
  Filled 2017-05-18: qty 1

## 2017-05-18 MED ORDER — IOPAMIDOL (ISOVUE-300) INJECTION 61%
INTRAVENOUS | Status: AC
  Filled 2017-05-18: qty 30

## 2017-05-18 MED ORDER — ONDANSETRON HCL 4 MG/2ML IJ SOLN
4.0000 mg | Freq: Four times a day (QID) | INTRAMUSCULAR | Status: DC
  Administered 2017-05-18 – 2017-05-22 (×13): 4 mg via INTRAVENOUS
  Filled 2017-05-18 (×13): qty 2

## 2017-05-18 MED ORDER — VANCOMYCIN HCL 500 MG IV SOLR
500.0000 mg | Freq: Two times a day (BID) | INTRAVENOUS | Status: DC
  Filled 2017-05-18: qty 500

## 2017-05-18 MED ORDER — DEXTROSE 5 % IV SOLN
1.0000 g | INTRAVENOUS | Status: AC
  Administered 2017-05-18: 1 g via INTRAVENOUS
  Filled 2017-05-18: qty 1

## 2017-05-18 MED ORDER — MORPHINE SULFATE (PF) 4 MG/ML IV SOLN
2.0000 mg | INTRAVENOUS | Status: DC | PRN
  Administered 2017-05-18: 2 mg via INTRAVENOUS
  Filled 2017-05-18: qty 1

## 2017-05-18 MED ORDER — IOPAMIDOL (ISOVUE-300) INJECTION 61%: 30.0000 mL | Freq: Once | INTRAVENOUS | Status: DC | PRN

## 2017-05-18 MED ORDER — SODIUM CHLORIDE 0.9 % IV BOLUS (SEPSIS)
1000.0000 mL | Freq: Once | INTRAVENOUS | Status: AC
  Administered 2017-05-18: 1000 mL via INTRAVENOUS

## 2017-05-18 MED ORDER — FENTANYL CITRATE (PF) 100 MCG/2ML IJ SOLN
50.0000 ug | Freq: Once | INTRAMUSCULAR | Status: AC
  Administered 2017-05-18: 50 ug via INTRAVENOUS
  Filled 2017-05-18: qty 2

## 2017-05-18 MED ORDER — VANCOMYCIN HCL IN DEXTROSE 1-5 GM/200ML-% IV SOLN
1000.0000 mg | INTRAVENOUS | Status: AC
  Administered 2017-05-18: 1000 mg via INTRAVENOUS
  Filled 2017-05-18: qty 200

## 2017-05-18 MED ORDER — TAMSULOSIN HCL 0.4 MG PO CAPS
0.4000 mg | ORAL_CAPSULE | Freq: Every day | ORAL | Status: DC
  Administered 2017-05-19 – 2017-05-22 (×4): 0.4 mg via ORAL
  Filled 2017-05-18 (×4): qty 1

## 2017-05-18 MED ORDER — FAMOTIDINE 20 MG PO TABS
20.0000 mg | ORAL_TABLET | Freq: Every day | ORAL | Status: DC
  Administered 2017-05-18 – 2017-05-21 (×4): 20 mg via ORAL
  Filled 2017-05-18 (×4): qty 1

## 2017-05-18 MED ORDER — POTASSIUM CHLORIDE CRYS ER 20 MEQ PO TBCR
20.0000 meq | EXTENDED_RELEASE_TABLET | Freq: Every day | ORAL | Status: DC
  Administered 2017-05-19 – 2017-05-22 (×4): 20 meq via ORAL
  Filled 2017-05-18 (×4): qty 1

## 2017-05-18 MED ORDER — SERTRALINE HCL 50 MG PO TABS
50.0000 mg | ORAL_TABLET | Freq: Every day | ORAL | Status: DC
  Administered 2017-05-19 – 2017-05-22 (×4): 50 mg via ORAL
  Filled 2017-05-18 (×4): qty 1

## 2017-05-18 NOTE — Care Management (Signed)
ED CM reviewed CM consult. Patient is being admitted. Care Management will follow for discharge needs. Venita Sheffield RN CCM

## 2017-05-18 NOTE — ED Notes (Signed)
This RN attempted IV access x2 without sucess

## 2017-05-18 NOTE — Progress Notes (Signed)
A consult was received from an ED physician for Vancomycin and Cefepime per pharmacy dosing.  The patient's profile has been reviewed for ht/wt/allergies/indication/available labs. Noted penicillin allergy, but patient has received Cefepime on previous admission.  A one time order has been placed for Vancomycin 1g IV and Cefepime 1g IV.  Further antibiotics/pharmacy consults should be ordered by admitting physician if indicated.                       Thank you,  Lindell Spar, PharmD, BCPS Pager: [phone] 05/18/2017 5:44 PM

## 2017-05-18 NOTE — ED Provider Notes (Signed)
[facility] DEPT Provider Note   CSN: [id number] Arrival date & time: 05/18/17  1456     History   Chief Complaint Chief Complaint  Patient presents with  . Anemia  . Fall    HPI Gregory [name]. is a 56 y.o. male with PMHx Crohn's disease, GERD, protein calorie malnutrition, deconditioning and kidney stones who presents today with chief complaint acute onset, presently worsening lightheadedness and generalized weakness. He underwent left nephrolithotomy percutaneously four days ago with urologist Dr. Louis Meckel and was discharged on POD #3 to a rehabilitation facility yesterday. He states that on 3 separate occasions he got up out of bed and immediately felt lightheaded and fell. One time he did endorse hitting his head but denies loss of consciousness. He endorses a right-sided frontal headache which is throbbing in nature with associated diplopia. He also endorses DOE and shortness of breath. Denies chest pain, abdominal pain, nausea, vomiting, diarrhea, constipation, melena, hematochezia, or hematuria. He denies fevers or chills. He states he has mild pain from his incision site. States that at the rehabilitation facility, he was told he had a hemoglobin of 5. Also endorses worsening memory since his falls. He is not on any bloodthinners.   The history is provided by the patient.  Fall  Associated symptoms include headaches and shortness of breath. Pertinent negatives include no chest pain and no abdominal pain.    Past Medical History:  Diagnosis Date  . Acute endocarditis   . Crohn's disease ([place])   . Depression   . Eating disorder   . GERD (gastroesophageal reflux disease)   . Kidney stones   . Short gut syndrome     Patient Active Problem List   Diagnosis Date Noted  . Protein-calorie malnutrition, severe 04/30/2017  . Anuresis 04/28/2017  . Physical deconditioning 04/28/2017  . Protein calorie malnutrition ([place]) 04/28/2017  . Abdominal pain, left upper quadrant  04/28/2017  . GERD (gastroesophageal reflux disease) 04/28/2017  . 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 Chronic pain 04/28/2017  . Depression 04/28/2017  . Nausea & vomiting   . Crohn's disease of small and large intestines with complication ([place])   . Nephrolithiasis 04/10/2017    Past Surgical History:  Procedure Laterality Date  . ILEOSTOMY    . IR NEPHROSTOMY EXCHANGE LEFT  04/12/2017  . IR NEPHROSTOMY PLACEMENT LEFT  04/12/2017  . IR NEPHROSTOMY PLACEMENT LEFT  04/29/2017  . NEPHROLITHOTOMY Left 05/14/2017   Procedure: LEFT NEPHROLITHOTOMY PERCUTANEOUS WITH SURGEON ACCESS;  Surgeon: [name], MD;  Location: [facility];  Service: Urology;  Laterality: Left;  . SMALL INTESTINE SURGERY    . SUBTOTAL COLECTOMY         Home Medications    Prior to Admission medications   Medication Sig Start Date End Date Taking? Authorizing Provider  cholecalciferol (VITAMIN D) 400 units TABS tablet Take 400 Units by mouth daily.   Yes [provider]  ciprofloxacin (CIPRO) 500 MG tablet Take 500 mg by mouth 2 (two) times daily. UTI   Yes [provider]  diphenoxylate-atropine (LOMOTIL) 2.5-0.025 MG tablet Take 1 tablet by mouth 4 (four) times daily. 02/23/17  Yes Armbruster, Carlota Raspberry, MD  famotidine (PEPCID) 20 MG tablet Take 1 tablet (20 mg total) by mouth at bedtime. 04/24/17  Yes Alexis Frock, MD  folic acid (FOLVITE) 1 MG tablet Take 1 tablet (1 mg total) by mouth daily. 03/01/17  Yes Armbruster, Carlota Raspberry, MD  midodrine (PROAMATINE) 10 MG tablet Take 1 tablet (10  mg total) by mouth 2 (two) times daily with a meal. 05/02/17  Yes Rai, Ripudeep K, MD  Multiple Vitamin (MULTIVITAMIN WITH MINERALS) TABS tablet Take 1 tablet by mouth daily. 05/03/17  Yes Rai, Ripudeep K, MD  potassium chloride SA (K-DUR,KLOR-CON) 20 MEQ tablet Take 1 tablet (20 mEq total) by mouth as directed. Patient taking differently: Take 20 mEq by mouth daily.  04/02/17  Yes Armbruster, Carlota Raspberry, MD    ranitidine (ZANTAC) 150 MG tablet Take 1 tablet (150 mg total) by mouth 2 (two) times daily. 03/09/17  Yes Armbruster, Carlota Raspberry, MD  sertraline (ZOLOFT) 50 MG tablet Take 1 tablet (50 mg total) by mouth daily. 05/02/17  Yes Rai, Ripudeep K, MD  tamsulosin (FLOMAX) 0.4 MG CAPS capsule Take 0.4 mg by mouth daily. 04/05/17  Yes [provider]  Amino Acids-Protein Hydrolys (FEEDING SUPPLEMENT, PRO-STAT SUGAR FREE 64,) LIQD Take 30 mLs by mouth 2 (two) times daily. 05/02/17   Rai, Vernelle Emerald, MD  ciprofloxacin (CIPRO) 500 MG tablet Take 1 tablet (500 mg total) by mouth 2 (two) times daily. X 2 weeks Patient not taking: Reported on 05/18/2017 05/02/17   Rai, Vernelle Emerald, MD  ondansetron (ZOFRAN-ODT) 4 MG disintegrating tablet Take 1 tablet (4 mg total) by mouth every 8 (eight) hours as needed for nausea or vomiting. 05/02/17   Rai, Ripudeep K, MD  oxyCODONE (OXY IR/ROXICODONE) 5 MG immediate release tablet Take 1-2 tablets (5-10 mg total) by mouth every 4 (four) hours as needed for severe pain. 05/16/17   Stasia Cavalier, MD  sodium chloride flush (NS) 0.9 % SOLN 5 mLs by Other route every 8 (eight) hours. Patient not taking: Reported on 05/18/2017 05/02/17   Mendel Corning, MD    Family History Family History  Problem Relation Age of Onset  . Heart disease Mother   . Diabetes Father   . Heart disease Father   . Heart attack Father   . Melanoma Sister   . Crohn's disease Brother   . Inflammatory bowel disease Brother   . Diabetes Paternal Grandmother     Social History Social History  Substance Use Topics  . Smoking status: Former Smoker    Packs/day: 1.00    Years: 20.00    Types: Cigarettes    Quit date: 08/31/2006  . Smokeless tobacco: Never Used  . Alcohol use No     Allergies   Methotrexate derivatives; Humira [adalimumab]; Penicillins; and Remicade [infliximab]   Review of Systems Review of Systems  Constitutional: Negative for chills and fever.  Respiratory: Positive for  shortness of breath.   Cardiovascular: Negative for chest pain.  Gastrointestinal: Negative for abdominal pain, blood in stool, diarrhea, nausea and vomiting.  Genitourinary: Negative for hematuria.  Neurological: Positive for weakness (generalized), light-headedness and headaches.  Psychiatric/Behavioral: Positive for confusion.     Physical Exam Updated Vital Signs BP (!) 94/58 (BP Location: Left Arm)   Pulse 81   Temp 98.7 F (37.1 C) (Rectal)   Resp 13   Ht 5' 11"  (1.803 m)   Wt 49.9 kg (110 lb)   SpO2 97%   BMI 15.34 kg/m   Physical Exam  Constitutional: He appears well-developed and well-nourished. No distress.  Thin, chronically ill-appearing  HENT:  Head: Normocephalic and atraumatic.  Eyes: Pupils are equal, round, and reactive to light. Conjunctivae are normal. Right eye exhibits no discharge. Left eye exhibits no discharge.  Pain with EOMs upward bilaterally, no tenderness on palpation of the orbits, no  periorbital swelling or erythema. No proptosis, chemosis, or consensual photophobia  Neck: Normal range of motion. Neck supple. No JVD present. No tracheal deviation present.  Cardiovascular: Normal rate, regular rhythm, normal heart sounds and intact distal pulses.   Pulmonary/Chest: Effort normal and breath sounds normal. No respiratory distress. He has no wheezes. He has no rales. He exhibits no tenderness.  Abdominal: Soft. Bowel sounds are normal. He exhibits no distension. There is no tenderness.  Ileostomy bag in place in the left lower quadrant without surrounding erythema or tenderness to palpation  Musculoskeletal: He exhibits no edema.  No midline spine TTP, no paraspinal muscle tenderness, no deformity, crepitus, or step-off noted. 4+/5 strength of BUE and BLE major muscle groups. No deformity, crepitus, or swelling noted on palpation of the extremities  Lymphadenopathy:    He has no cervical adenopathy.  Neurological: He is alert.  Fluent speech, no  facial droop, cranial nerves II through XII tested and intact. Alert and oriented to person and place but not time. Sensation intact to soft touch of extremities.   Skin: Skin is warm and dry. No erythema.  Superficial skin abrasion to the dorsal aspect of the left forearm, bleeding controlled. Multiple areas of ecchymosis to the extremities noted with no underlying swelling, deformity, or crepitus. Incision in the left lumbar region without surrounding erythema, tenderness, bleeding, or abnormal drainage  Psychiatric: He has a normal mood and affect. His behavior is normal.  Nursing note and vitals reviewed.    ED Treatments / Results  Labs (all labs ordered are listed, but only abnormal results are displayed) Labs Reviewed  CBC WITH DIFFERENTIAL/PLATELET - Abnormal; Notable for the following:       Result Value   WBC 16.2 (*)    RBC 2.32 (*)    Hemoglobin 6.7 (*)    HCT 20.1 (*)    RDW 19.0 (*)    Neutro Abs 13.7 (*)    Lymphs Abs 0.6 (*)    Monocytes Absolute 1.9 (*)    All other components within normal limits  COMPREHENSIVE METABOLIC PANEL - Abnormal; Notable for the following:    Glucose, Bld 112 (*)    Creatinine, Ser 1.26 (*)    Calcium 8.2 (*)    Total Protein 4.9 (*)    Albumin 1.7 (*)    All other components within normal limits  CULTURE, BLOOD (ROUTINE X 2)  CULTURE, BLOOD (ROUTINE X 2)  PROTIME-INR  URINALYSIS, ROUTINE W REFLEX MICROSCOPIC  I-STAT TROPONIN, ED  I-STAT CG4 LACTIC ACID, ED  TYPE AND SCREEN  ABO/RH  PREPARE RBC (CROSSMATCH)    EKG  EKG Interpretation None       Radiology Dg Chest 2 View  Result Date: 05/18/2017 CLINICAL DATA:  Fall yesterday. Decreased hemoglobin. Shortness of breath. EXAM: CHEST  2 VIEW COMPARISON:  None. FINDINGS: Heart size is within normal limits. 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 No pneumothorax visualized. Small left pleural effusion versus pleural thickening. Bibasilar pulmonary opacity is also seen which may be  due to atelectasis, infection, or contusion. IMPRESSION: Bibasilar pulmonary opacity, which may be due to atelectasis, infection, or contusion . Small left hemothorax or other pleural effusion cannot be excluded. No pneumothorax visualized. Bullous emphysema. Electronically Signed   By: Earle Gell M.D.   On: 05/18/2017 16:59   Ct Head Wo Contrast  Result Date: 05/18/2017 CLINICAL DATA:  Unwitnessed fall at rehab facility, head and neck trauma EXAM: CT HEAD WITHOUT CONTRAST CT MAXILLOFACIAL WITHOUT CONTRAST CT CERVICAL SPINE  WITHOUT CONTRAST TECHNIQUE: Multidetector CT imaging of the head, cervical spine, and maxillofacial structures were performed using the standard protocol without intravenous contrast. Multiplanar CT image reconstructions of the cervical spine and maxillofacial structures were also generated. COMPARISON:  None. FINDINGS: CT HEAD FINDINGS Brain: No evidence of acute infarction, hemorrhage, hydrocephalus, extra-axial collection or mass lesion/mass effect. Vascular: No hyperdense vessel or unexpected calcification. Skull: Normal. Negative for fracture or focal lesion. Other: None. CT MAXILLOFACIAL FINDINGS Osseous: No fracture or mandibular dislocation. No destructive process. Orbits: Negative. No traumatic or inflammatory finding. Sinuses: Clear. Soft tissues: Negative. CT CERVICAL SPINE FINDINGS Alignment: Normal. Skull base and vertebrae: No acute fracture. No primary bone lesion or focal pathologic process. Soft tissues and spinal canal: No prevertebral fluid or swelling. No visible canal hematoma. Disc levels: Minor cervical degenerative spondylosis at C5-6 and C6-7. Preserved vertebral body heights. No acute osseous finding or fracture. Facets are aligned. Upper chest: Apical bullous disease noted. Other: None. IMPRESSION: Normal head CT without contrast.  No acute intracranial abnormality. No acute facial bony trauma or fracture. No acute cervical spine fracture or malalignment by CT.  Minor cervical spondylosis. Electronically Signed   By: Jerilynn Mages.  Shick M.D.   On: 05/18/2017 17:34   Ct Cervical Spine Wo Contrast  Result Date: 05/18/2017 CLINICAL DATA:  Unwitnessed fall at rehab facility, head and neck trauma EXAM: CT HEAD WITHOUT CONTRAST CT MAXILLOFACIAL WITHOUT CONTRAST CT CERVICAL SPINE WITHOUT CONTRAST TECHNIQUE: Multidetector CT imaging of the head, cervical spine, and maxillofacial structures were performed using the standard protocol without intravenous contrast. Multiplanar CT image reconstructions of the cervical spine and maxillofacial structures were also generated. COMPARISON:  None. FINDINGS: CT HEAD FINDINGS Brain: No evidence of acute infarction, hemorrhage, hydrocephalus, extra-axial collection or mass lesion/mass effect. Vascular: No hyperdense vessel or unexpected calcification. Skull: Normal. Negative for fracture or focal lesion. Other: None. CT MAXILLOFACIAL FINDINGS Osseous: No fracture or mandibular dislocation. No destructive process. Orbits: Negative. No traumatic or inflammatory finding. Sinuses: Clear. Soft tissues: Negative. CT CERVICAL SPINE FINDINGS Alignment: Normal. Skull base and vertebrae: No acute fracture. No primary bone lesion or focal pathologic process. Soft tissues and spinal canal: No prevertebral fluid or swelling. No visible canal hematoma. Disc levels: Minor cervical degenerative spondylosis at C5-6 and C6-7. Preserved vertebral body heights. No acute osseous finding or fracture. Facets are aligned. Upper chest: Apical bullous disease noted. Other: None. IMPRESSION: Normal head CT without contrast.  No acute intracranial abnormality. No acute facial bony trauma or fracture. No acute cervical spine fracture or malalignment by CT. Minor cervical spondylosis. Electronically Signed   By: Jerilynn Mages.  Shick M.D.   On: 05/18/2017 17:34   Ct Maxillofacial Wo Contrast  Result Date: 05/18/2017 CLINICAL DATA:  Unwitnessed fall at rehab facility, head and neck trauma  EXAM: CT HEAD WITHOUT CONTRAST CT MAXILLOFACIAL WITHOUT CONTRAST CT CERVICAL SPINE WITHOUT CONTRAST TECHNIQUE: Multidetector CT imaging of the head, cervical spine, and maxillofacial structures were performed using the standard protocol without intravenous contrast. Multiplanar CT image reconstructions of the cervical spine and maxillofacial structures were also generated. COMPARISON:  None. FINDINGS: CT HEAD FINDINGS Brain: No evidence of acute infarction, hemorrhage, hydrocephalus, extra-axial collection or mass lesion/mass effect. Vascular: No hyperdense vessel or unexpected calcification. Skull: Normal. Negative for fracture or focal lesion. Other: None. CT MAXILLOFACIAL FINDINGS Osseous: No fracture or mandibular dislocation. No destructive process. Orbits: Negative. No traumatic or inflammatory finding. Sinuses: Clear. Soft tissues: Negative. CT CERVICAL SPINE FINDINGS Alignment: Normal. Skull base and vertebrae: No acute  fracture. No primary bone lesion or focal pathologic process. Soft tissues and spinal canal: No prevertebral fluid or swelling. No visible canal hematoma. Disc levels: Minor cervical degenerative spondylosis at C5-6 and C6-7. Preserved vertebral body heights. No acute osseous finding or fracture. Facets are aligned. Upper chest: Apical bullous disease noted. Other: None. IMPRESSION: Normal head CT without contrast.  No acute intracranial abnormality. No acute facial bony trauma or fracture. No acute cervical spine fracture or malalignment by CT. Minor cervical spondylosis. Electronically Signed   By: Jerilynn Mages.  Shick M.D.   On: 05/18/2017 17:34    Procedures Procedures (including critical care time)  Medications Ordered in ED Medications  ceFEPIme (MAXIPIME) 1 g in dextrose 5 % 50 mL IVPB (1 g Intravenous New Bag/Given 05/18/17 1810)  vancomycin (VANCOCIN) IVPB 1000 mg/200 mL premix (not administered)  sodium chloride 0.9 % bolus 1,000 mL (0 mLs Intravenous Stopped 05/18/17 1800)  fentaNYL  (SUBLIMAZE) injection 50 mcg (50 mcg Intravenous Given 05/18/17 1646)  0.9 %  sodium chloride infusion ( Intravenous New Bag/Given 05/18/17 1810)     Initial Impression / Assessment and Plan / ED Course  I have reviewed the triage vital signs and the nursing notes.  Pertinent labs & imaging results that were available during my care of the patient were reviewed by me and considered in my medical decision making (see chart for details).     Patient 4 days from percutaneous nephrolithotomy presents with lightheadedness, near syncope, generalized weakness. He is afebrile, but hypotensive while in the ED. Found to have a hemoglobin of 6.7, down from 8.74 days ago. No focal neurological deficits on examination, and imaging of the head neck and face are without acute abnormality including bleed or fracture. No evidence of cellulitis surrounding his incision. Chest x-ray does show bibasilar pulmonary opacities suggestive of infection, possible aspiration pneumonia. This is concerning for a hospital acquired pneumonia, initiated thinking cefepime. Blood transfusion initiated in the ED. Spoke with Dr. Denton Brick hospitalist, who agrees to assume care of patient and bring him into the hospital for further observation and management. Patient seen and evaluated by Dr. Darl Householder, who agrees with assessment and plan at this time.  CRITICAL CARE Performed by: Renita Papa   Total critical care time: 40 minutes  Critical care time was exclusive of separately billable procedures and treating other patients.  Critical care was necessary to treat or prevent imminent or life-threatening deterioration.  Critical care was time spent personally by me on the following activities: development of treatment plan with patient and/or surrogate as well as nursing, discussions with consultants, evaluation of patient's response to treatment, examination of patient, obtaining history from patient or surrogate, ordering and performing  treatments and interventions, ordering and review of laboratory studies, ordering and review of radiographic studies, pulse oximetry and re-evaluation of patient's condition.   Final Clinical Impressions(s) / ED Diagnoses   Final diagnoses:  Symptomatic anemia  HCAP (healthcare-associated pneumonia)    New Prescriptions New Prescriptions   No medications on file     Debroah Baller 05/18/17 1841    Drenda Freeze, MD 05/21/17 [phone]

## 2017-05-18 NOTE — H&P (Addendum)
History and Physical    Dantre Yearwood. GNF:[id number] DOB: 1961/01/18 DOA: 05/18/2017  PCP: Billie Ruddy, MD   Patient coming from: [facility]  Chief Complaint: low hemoglobin, Falls, Shortness of breath.  HPI: Ramari Bray. is a 56 y.o. male with medical history significant for- Crohn's disease- status post colectomy and ostomykidney stones status post recentnephrostomy tube placement and nephrolithotomy, depression.  Patient fell 3 times since discharge from the hospital yesterday, at SNF. Hgb was done and was reportedly low at 5, patient was sent to the ED. Patient reports dizziness standing since discharge from hospital, with subsequent falls. Patient also reports difficulty breathing worse with activity, that started 2-3 days ago. Patient also endorses a dry cough- 2-3 days' duration. Patient endorses chills, but no fevers.  He also reports uncontrolled mostly left flank and some abdominal pain since discharge from the hospital to SNF, as pain medications were sent electronically, and family did not pick up paper prescription.  Patient also endorses chest pain, intermittent, nonradiating, lower central chest. Patient also endorsed dysuria- with passage of dark urine, that started this afternoon. Patient also endorsed black stools, noticed this morning. At baseline patient has 4-5 stools per day. Patient reports last colonoscopy was about 4 years ago, and was told he didn't need subsequent colonoscopies.  Recent multiple hospital admissions. Patient was just discharged yesterday from the hospital after admission 9/14-9/17, from the urology service. Patient had large left kidney stone, as subsequently underwent elective surgery- 9/14- cystoscopy, left retrograde pyelogram with interpretation, left percutaneous renal access, left nephrolithotomy, left nephrostogram,  left nephrostomy tube placement.  Other recent admissions-  04/28/17- 05/02/17- managed for nephrostomy  dysfunction/hydronephrosis left pyelonephritis. 04/20/17- 04/24/17- failure to thrive, dehydration. 04/10/17- 04/13/17- Left UPJ stone, moderate hydronephrosis, had left percutaneous nephrostomy tube placement 04/12/17.  ED Course: stable vitals, percent on room air. Hemoglobin low at 6.7- from 7.9 05/16/17. Elevated WBC 16- improved from 31 05/16/17. Creatinine 1.2- improved compared to discharge hemoglobin 1.6. hest x-ray showed bibasilar pulmonary opacity- atelectasis, infection, contusion. Head CT, maxillofacial CT, cervical spine CT- all negative for acute abnormality. Recent was started on IV vancomycin and Cefepime in the ED for HCAP.   Review of Systems:  CONSTITUTIONAL- No Fever, + reduced Po intake SKIN- No Rash, colour changes or itching. Mouth/throat- No Sorethroat, or bleeding gums. GI- Dry heaves, abd pain, Stable 4-5 loose bowel movements daily. NEUROLOGIC- No Numbness, syncope, seizures or burning. [facility]- Denies depression or anxiety.  Past Medical History:  Diagnosis Date  . Acute endocarditis   . Crohn's disease ([place])   . Depression   . Eating disorder   . GERD (gastroesophageal reflux disease)   . Kidney stones   . Short gut syndrome     Past Surgical History:  Procedure Laterality Date  . ILEOSTOMY    . IR NEPHROSTOMY EXCHANGE LEFT  04/12/2017  . IR NEPHROSTOMY PLACEMENT LEFT  04/12/2017  . IR NEPHROSTOMY PLACEMENT LEFT  04/29/2017  . NEPHROLITHOTOMY Left 05/14/2017   Procedure: LEFT NEPHROLITHOTOMY PERCUTANEOUS WITH SURGEON ACCESS;  Surgeon: [name], MD;  Location: [facility];  Service: Urology;  Laterality: Left;  . SMALL INTESTINE SURGERY    . SUBTOTAL COLECTOMY      reports that he quit smoking about 10 years ago. His smoking use included Cigarettes. He has a 20.00 pack-year smoking history. He has never used smokeless tobacco. He reports that he does not drink alcohol or use drugs.  Allergies  Allergen Reactions  .  Methotrexate Derivatives Other (See  Comments)    Anemia, low WBC, severe GI symptoms,   . Humira [Adalimumab] Other (See Comments)    Intolerance  . Penicillins Other (See Comments)    Has patient had a PCN reaction causing immediate rash, facial/tongue/throat swelling, SOB or lightheadedness with hypotension: Unknown Has patient had a PCN reaction causing severe rash involving mucus membranes or skin necrosis: Unknown Has patient had a PCN reaction that required hospitalization: Unknown Has patient had a PCN reaction occurring within the last 10 years: No If all of the above answers are "NO", then may proceed with Cephalosporin use.   . Remicade [Infliximab] Other (See Comments)    Intolerance    Family History  Problem Relation Age of Onset  . Heart disease Mother   . Diabetes Father   . Heart disease Father   . Heart attack Father   . Melanoma Sister   . Crohn's disease Brother   . Inflammatory bowel disease Brother   . Diabetes Paternal Grandmother     Prior to Admission medications   Medication Sig Start Date End Date Taking? Authorizing Provider  cholecalciferol (VITAMIN D) 400 units TABS tablet Take 400 Units by mouth daily.   Yes [provider]  ciprofloxacin (CIPRO) 500 MG tablet Take 500 mg by mouth 2 (two) times daily. UTI   Yes [provider]  diphenoxylate-atropine (LOMOTIL) 2.5-0.025 MG tablet Take 1 tablet by mouth 4 (four) times daily. 02/23/17  Yes Armbruster, Carlota Raspberry, MD  famotidine (PEPCID) 20 MG tablet Take 1 tablet (20 mg total) by mouth at bedtime. 04/24/17  Yes Alexis Frock, MD  folic acid (FOLVITE) 1 MG tablet Take 1 tablet (1 mg total) by mouth daily. 03/01/17  Yes Armbruster, Carlota Raspberry, MD  midodrine (PROAMATINE) 10 MG tablet Take 1 tablet (10 mg total) by mouth 2 (two) times daily with a meal. 05/02/17  Yes Rai, Ripudeep K, MD  Multiple Vitamin (MULTIVITAMIN WITH MINERALS) TABS tablet Take 1 tablet by mouth daily. 05/03/17  Yes Rai, Ripudeep K, MD  potassium chloride SA  (K-DUR,KLOR-CON) 20 MEQ tablet Take 1 tablet (20 mEq total) by mouth as directed. Patient taking differently: Take 20 mEq by mouth daily.  04/02/17  Yes Armbruster, Carlota Raspberry, MD  ranitidine (ZANTAC) 150 MG tablet Take 1 tablet (150 mg total) by mouth 2 (two) times daily. 03/09/17  Yes Armbruster, Carlota Raspberry, MD  sertraline (ZOLOFT) 50 MG tablet Take 1 tablet (50 mg total) by mouth daily. 05/02/17  Yes Rai, Ripudeep K, MD  tamsulosin (FLOMAX) 0.4 MG CAPS capsule Take 0.4 mg by mouth daily. 04/05/17  Yes [provider]  Amino Acids-Protein Hydrolys (FEEDING SUPPLEMENT, PRO-STAT SUGAR FREE 64,) LIQD Take 30 mLs by mouth 2 (two) times daily. 05/02/17   Rai, Vernelle Emerald, MD  ciprofloxacin (CIPRO) 500 MG tablet Take 1 tablet (500 mg total) by mouth 2 (two) times daily. X 2 weeks Patient not taking: Reported on 05/18/2017 05/02/17   Rai, Vernelle Emerald, MD  ondansetron (ZOFRAN-ODT) 4 MG disintegrating tablet Take 1 tablet (4 mg total) by mouth every 8 (eight) hours as needed for nausea or vomiting. 05/02/17   Rai, Ripudeep K, MD  oxyCODONE (OXY IR/ROXICODONE) 5 MG immediate release tablet Take 1-2 tablets (5-10 mg total) by mouth every 4 (four) hours as needed for severe pain. 05/16/17   Stasia Cavalier, MD  sodium chloride flush (NS) 0.9 % SOLN 5 mLs by Other route every 8 (eight) hours. Patient not taking:  Reported on 05/18/2017 05/02/17   Mendel Corning, MD    Physical Exam: Vitals:   05/18/17 1900 05/18/17 1909 05/18/17 1915 05/18/17 1924  BP: 111/72 111/72  105/72  Pulse: 80 82 88 80  Resp: 15 15 14 14   Temp:  97.7 F (36.5 C)  98.4 F (36.9 C)  TempSrc:  Oral  Oral  SpO2: 96%  96% 96%  Weight:      Height:        Constitutional: NAD, calm, comfortable Vitals:   05/18/17 1900 05/18/17 1909 05/18/17 1915 05/18/17 1924  BP: 111/72 111/72  105/72  Pulse: 80 82 88 80  Resp: 15 15 14 14   Temp:  97.7 F (36.5 C)  98.4 F (36.9 C)  TempSrc:  Oral  Oral  SpO2: 96%  96% 96%  Weight:        Height:       Eyes: PERRL, lids and conjunctivae normal ENMT: Mucous membranes are moist. Posterior pharynx clear of any exudate or lesions.Normal dentition.  Neck: normal, supple, no masses, no thyromegaly Respiratory: clear but reduced breath sounds bibasilar bilaterally, no wheezing, no crackles. Normal respiratory effort. No accessory muscle use.  Cardiovascular: Regular rate and rhythm, no murmurs / rubs / gallops. No extremity edema. 2+ pedal pulses. No carotid bruits.  Abdomen: supra pubic tenderness, with guarding, no masses palpated. No hepatosplenomegaly. Bowel sounds positive. Left lower iliostomy. Left flank tenderness, with clean dry dressing, area looks clean and dry. Musculoskeletal: no clubbing / cyanosis. No joint deformity upper and lower extremities. Good ROM, no contractures. Normal muscle tone.  Skin: no rashes, lesions, ulcers. No induration Neurologic: CN 2-12 grossly intact. Sensation intact, DTR normal. Strength 5/5 in all 4.  Psychiatric: Normal judgment and insight. Alert and oriented x 3. Normal mood.   Labs on Admission: I have personally reviewed following labs and imaging studies  CBC:  Recent Labs Lab 05/14/17 1502 05/14/17 1821 05/15/17 0430 05/16/17 0726 05/18/17 1628  WBC 52.7* 57.7* 56.5* 31.4* 16.2*  NEUTROABS  --   --   --   --  13.7*  HGB 9.5* 8.5* 8.7* 7.9* 6.7*  HCT 29.3* 25.7* 27.0* 23.4* 20.1*  MCV 92.7 91.1 91.2 87.6 86.6  PLT 428* 419* 496* 324 725   Basic Metabolic Panel:  Recent Labs Lab 05/14/17 1502 05/16/17 0726 05/18/17 1628  NA 138 136 136  K 4.0 4.8 3.5  CL 115* 112* 106  CO2 15* 18* 22  GLUCOSE 160* 97 112*  BUN 10 14 16   CREATININE 1.02 1.63* 1.26*  CALCIUM 7.3* 7.9* 8.2*   GFR: Estimated Creatinine Clearance: 46.2 mL/min (A) (by C-G formula based on SCr of 1.26 mg/dL (H)). Liver Function Tests:  Recent Labs Lab 05/18/17 1628  AST 40  ALT 27  ALKPHOS 121  BILITOT 0.7  PROT 4.9*  ALBUMIN 1.7*    Coagulation Profile:  Recent Labs Lab 05/18/17 1628  INR 1.18   Urine analysis:    Component Value Date/Time   COLORURINE YELLOW 05/18/2017 1900   APPEARANCEUR HAZY (A) 05/18/2017 1900   LABSPEC 1.015 05/18/2017 1900   PHURINE 5.0 05/18/2017 1900   GLUCOSEU NEGATIVE 05/18/2017 1900   HGBUR LARGE (A) 05/18/2017 1900   BILIRUBINUR NEGATIVE 05/18/2017 1900   KETONESUR NEGATIVE 05/18/2017 1900   PROTEINUR 30 (A) 05/18/2017 1900   NITRITE NEGATIVE 05/18/2017 1900   LEUKOCYTESUR MODERATE (A) 05/18/2017 1900    Radiological Exams on Admission: Dg Chest 2 View  Result Date: 05/18/2017 CLINICAL DATA:  Fall yesterday. Decreased hemoglobin. Shortness of breath. EXAM: CHEST  2 VIEW COMPARISON:  None. FINDINGS: Heart size is within normal limits. 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 No pneumothorax visualized. Small left pleural effusion versus pleural thickening. Bibasilar pulmonary opacity is also seen which may be due to atelectasis, infection, or contusion. IMPRESSION: Bibasilar pulmonary opacity, which may be due to atelectasis, infection, or contusion . Small left hemothorax or other pleural effusion cannot be excluded. No pneumothorax visualized. Bullous emphysema. Electronically Signed   By: Earle Gell M.D.   On: 05/18/2017 16:59   Ct Head Wo Contrast  Result Date: 05/18/2017 CLINICAL DATA:  Unwitnessed fall at rehab facility, head and neck trauma EXAM: CT HEAD WITHOUT CONTRAST CT MAXILLOFACIAL WITHOUT CONTRAST CT CERVICAL SPINE WITHOUT CONTRAST TECHNIQUE: Multidetector CT imaging of the head, cervical spine, and maxillofacial structures were performed using the standard protocol without intravenous contrast. Multiplanar CT image reconstructions of the cervical spine and maxillofacial structures were also generated. COMPARISON:  None. FINDINGS: CT HEAD FINDINGS Brain: No evidence of acute infarction, hemorrhage, hydrocephalus, extra-axial collection or mass lesion/mass effect. Vascular:  No hyperdense vessel or unexpected calcification. Skull: Normal. Negative for fracture or focal lesion. Other: None. CT MAXILLOFACIAL FINDINGS Osseous: No fracture or mandibular dislocation. No destructive process. Orbits: Negative. No traumatic or inflammatory finding. Sinuses: Clear. Soft tissues: Negative. CT CERVICAL SPINE FINDINGS Alignment: Normal. Skull base and vertebrae: No acute fracture. No primary bone lesion or focal pathologic process. Soft tissues and spinal canal: No prevertebral fluid or swelling. No visible canal hematoma. Disc levels: Minor cervical degenerative spondylosis at C5-6 and C6-7. Preserved vertebral body heights. No acute osseous finding or fracture. Facets are aligned. Upper chest: Apical bullous disease noted. Other: None. IMPRESSION: Normal head CT without contrast.  No acute intracranial abnormality. No acute facial bony trauma or fracture. No acute cervical spine fracture or malalignment by CT. Minor cervical spondylosis. Electronically Signed   By: Jerilynn Mages.  Shick M.D.   On: 05/18/2017 17:34   Ct Cervical Spine Wo Contrast  Result Date: 05/18/2017 CLINICAL DATA:  Unwitnessed fall at rehab facility, head and neck trauma EXAM: CT HEAD WITHOUT CONTRAST CT MAXILLOFACIAL WITHOUT CONTRAST CT CERVICAL SPINE WITHOUT CONTRAST TECHNIQUE: Multidetector CT imaging of the head, cervical spine, and maxillofacial structures were performed using the standard protocol without intravenous contrast. Multiplanar CT image reconstructions of the cervical spine and maxillofacial structures were also generated. COMPARISON:  None. FINDINGS: CT HEAD FINDINGS Brain: No evidence of acute infarction, hemorrhage, hydrocephalus, extra-axial collection or mass lesion/mass effect. Vascular: No hyperdense vessel or unexpected calcification. Skull: Normal. Negative for fracture or focal lesion. Other: None. CT MAXILLOFACIAL FINDINGS Osseous: No fracture or mandibular dislocation. No destructive process. Orbits:  Negative. No traumatic or inflammatory finding. Sinuses: Clear. Soft tissues: Negative. CT CERVICAL SPINE FINDINGS Alignment: Normal. Skull base and vertebrae: No acute fracture. No primary bone lesion or focal pathologic process. Soft tissues and spinal canal: No prevertebral fluid or swelling. No visible canal hematoma. Disc levels: Minor cervical degenerative spondylosis at C5-6 and C6-7. Preserved vertebral body heights. No acute osseous finding or fracture. Facets are aligned. Upper chest: Apical bullous disease noted. Other: None. IMPRESSION: Normal head CT without contrast.  No acute intracranial abnormality. No acute facial bony trauma or fracture. No acute cervical spine fracture or malalignment by CT. Minor cervical spondylosis. Electronically Signed   By: Jerilynn Mages.  Shick M.D.   On: 05/18/2017 17:34   Ct Maxillofacial Wo Contrast  Result Date: 05/18/2017 CLINICAL DATA:  Unwitnessed fall at rehab  facility, head and neck trauma EXAM: CT HEAD WITHOUT CONTRAST CT MAXILLOFACIAL WITHOUT CONTRAST CT CERVICAL SPINE WITHOUT CONTRAST TECHNIQUE: Multidetector CT imaging of the head, cervical spine, and maxillofacial structures were performed using the standard protocol without intravenous contrast. Multiplanar CT image reconstructions of the cervical spine and maxillofacial structures were also generated. COMPARISON:  None. FINDINGS: CT HEAD FINDINGS Brain: No evidence of acute infarction, hemorrhage, hydrocephalus, extra-axial collection or mass lesion/mass effect. Vascular: No hyperdense vessel or unexpected calcification. Skull: Normal. Negative for fracture or focal lesion. Other: None. CT MAXILLOFACIAL FINDINGS Osseous: No fracture or mandibular dislocation. No destructive process. Orbits: Negative. No traumatic or inflammatory finding. Sinuses: Clear. Soft tissues: Negative. CT CERVICAL SPINE FINDINGS Alignment: Normal. Skull base and vertebrae: No acute fracture. No primary bone lesion or focal pathologic  process. Soft tissues and spinal canal: No prevertebral fluid or swelling. No visible canal hematoma. Disc levels: Minor cervical degenerative spondylosis at C5-6 and C6-7. Preserved vertebral body heights. No acute osseous finding or fracture. Facets are aligned. Upper chest: Apical bullous disease noted. Other: None. IMPRESSION: Normal head CT without contrast.  No acute intracranial abnormality. No acute facial bony trauma or fracture. No acute cervical spine fracture or malalignment by CT. Minor cervical spondylosis. Electronically Signed   By: Jerilynn Mages.  Shick M.D.   On: 05/18/2017 17:34   EKG: Independently reviewed. Qtc- 482. Normal intevals. No significant change from prior.   Assessment/Plan Principal Problem:   Anemia Active Problems:   Nephrolithiasis   Crohn's disease of small and large intestines with complication (HCC)   Depression   Protein-calorie malnutrition, severe   Acute symptomatic Anemia- with dizziness and shortness of breath, hypotension on admission 94/58. Hemoglobin 6.7 from 7.9-  9/16. Hgb over the past month- 8-9. S/p recent surgery.  Etiology- likely surgical or Post surgical blood loss. Renal stone study- 05/15/17- perinephric hematoma. Pt also reports melena and has hematuria. - repeat CT abdomen wo contrast, rule out worsening perinephric hematoma - Transfuse 1 unit of blood today, can order 2nd unit in a.m. - CBC a.m.  Possible HCAP- SOB which may be due to acute anemia or infection, also dry cough. Hypotension on admission. No fever, WBC- Improving- now 16. Immunocompromised pt. 2 view Chest x-ray- By basilar pulmonary opacity- atelectasis or infection. Patient reports uncontrolled abd and flank pain- likely cause of atelectasis on imaging, also seen on CT abd 9/15. - Will treat as HCAP with IV vancomycin and cefepime per pharm, pending clinical course and blood cultures - follow-up blood cultures drawn in ED - urine strep - incentive spirometry  Chest pain- EKG  unchanged I-STAT troponin negative. ?relation to anemia. - Trops x 3 - EKG am  Nephrolithiasis- s/p Stent, nephrolithotomy 05/14/17. Renal stone study 9/15- Status post placement of a left ureteric stent. Stone fragments, Small volume left perinephric hematoma.  Abdominal pain, flank pain- With WBC 16<<< 57 (was thought to be due to steroids and surgery). History of Crohn's - CT abdomen and pelvis without contrast  Crohn's disease- s/p colectomy and ostomy. Off steroids- 8/21. Abd pain- ?relation to crohns or surgery. Stable 4-5 bowel movements daily, pt reports melena but no blood. Now on entyvio infusion, due today. Off methotrexate- possible allergic reaction. Last Ct suggested acute enteritis.  - Ct Abd Wo Contrast - Stool FOBT  DVT prophylaxis: SCD Code Status: Full  Family Communication: Spouse at bedside  Disposition Plan: To be determined  Consults called: None Admission status: Inpt, Tele   Salome Cozby Arlyce Dice MD [facility] Pager [phone]  If 7PM-7AM, please contact night-coverage [email or web address] Password [facility]  05/18/2017, 8:05 PM

## 2017-05-18 NOTE — ED Notes (Signed)
Blood draw delayed at this time due to receiving blood transfusion.

## 2017-05-18 NOTE — ED Notes (Signed)
Patient transported to CT 

## 2017-05-18 NOTE — ED Triage Notes (Signed)
Pt comes from [facility] via EMS after having a kidney stone surgery yesterday with complaints of an unwitnessed fall after having labs drawn showing a HGB of 5. A&O x4.  Pt reports he did strike his head during fall but did not lose consciousness.  Still having left flank pain from surgical site. No blood thinners. Vitals 96/50 (this is pt's baseline), HR 88.

## 2017-05-18 NOTE — ED Notes (Signed)
Bed: IL57 Expected date:  Expected time:  Means of arrival:  Comments: Ems- 56yo M, low hgb/multiple falls

## 2017-05-18 NOTE — ED Notes (Signed)
Pt refused CT

## 2017-05-18 NOTE — Progress Notes (Signed)
Pharmacy Antibiotic Note  Gregory [name]. is a 56 y.o. male admitted on 05/18/2017 with pneumonia.  Patient presents with falls from SNF after hospital discharge yesterday.  Patient admitted 9/14-9/17 for kidney stones and is s/p percutaneous nephrostomy tube placement 04/12/17.  Pharmacy has been consulted for vancomycin and cefepime dosing for possible HCAP.  Vancomycin 1g IV and Cefepime 1g IV one time doses ordered in ED.  Only Cefepime administered.  Vancomycin 1g dose delayed due to blood transfusion per RN.  Plan: Vancomycin 500 mg IV q12h.  Start after loading dose given.  Cefepime 1g IV q12h. Daily SCr given AKI on presentation.  Height: 5' 11"  (180.3 cm) Weight: 119 lb 14.9 oz (54.4 kg) IBW/kg (Calculated) : 75.3  Temp (24hrs), Avg:98.2 F (36.8 C), Min:97.7 F (36.5 C), Max:98.7 F (37.1 C)   Recent Labs Lab 05/14/17 1502 05/14/17 1821 05/15/17 0430 05/16/17 0726 05/18/17 1628 05/18/17 1805  WBC 52.7* 57.7* 56.5* 31.4* 16.2*  --   CREATININE 1.02  --   --  1.63* 1.26*  --   LATICACIDVEN  --   --   --   --   --  0.56    Estimated Creatinine Clearance: 50.4 mL/min (A) (by C-G formula based on SCr of 1.26 mg/dL (H)).    Allergies  Allergen Reactions  . Methotrexate Derivatives Other (See Comments)    Anemia, low WBC, severe GI symptoms,   . Humira [Adalimumab] Other (See Comments)    Intolerance  . Penicillins Other (See Comments)    Has patient had a PCN reaction causing immediate rash, facial/tongue/throat swelling, SOB or lightheadedness with hypotension: Unknown Has patient had a PCN reaction causing severe rash involving mucus membranes or skin necrosis: Unknown Has patient had a PCN reaction that required hospitalization: Unknown Has patient had a PCN reaction occurring within the last 10 years: No If all of the above answers are "NO", then may proceed with Cephalosporin use.   . Remicade [Infliximab] Other (See Comments)    Intolerance     Antimicrobials this admission: 9/18 Vancomycin >>  9/18 Cefepime >>   Dose adjustments this admission:  Microbiology results: 9/18 BCx:   Thank you for allowing pharmacy to be a part of this patient's care.  Hershal Coria 05/18/2017 9:35 PM

## 2017-05-18 NOTE — ED Notes (Signed)
Call report to [facility] [phone] @ [phone]

## 2017-05-19 ENCOUNTER — Inpatient Hospital Stay (HOSPITAL_COMMUNITY): Payer: Medicare Other

## 2017-05-19 DIAGNOSIS — D62 Acute posthemorrhagic anemia: Secondary | ICD-10-CM

## 2017-05-19 LAB — BASIC METABOLIC PANEL
ANION GAP: 6 (ref 5–15)
BUN: 9 mg/dL (ref 6–20)
CHLORIDE: 107 mmol/L (ref 101–111)
CO2: 22 mmol/L (ref 22–32)
Calcium: 8 mg/dL — ABNORMAL LOW (ref 8.9–10.3)
Creatinine, Ser: 0.94 mg/dL (ref 0.61–1.24)
GFR calc non Af Amer: >60 mL/min (ref >60)
Glucose, Bld: 121 mg/dL — ABNORMAL HIGH (ref 65–99)
Potassium: 3.3 mmol/L — ABNORMAL LOW (ref 3.5–5.1)
Sodium: 135 mmol/L (ref 135–145)

## 2017-05-19 LAB — BPAM RBC
Blood Product Expiration Date: 201810112359
ISSUE DATE / TIME: 201809181856
Unit Type and Rh: 5100

## 2017-05-19 LAB — OCCULT BLOOD X 1 CARD TO LAB, STOOL: FECAL OCCULT BLD: NEGATIVE

## 2017-05-19 LAB — TYPE AND SCREEN
ABO/RH(D): O POS
Antibody Screen: NEGATIVE
Unit division: 0

## 2017-05-19 LAB — URINALYSIS, COMPLETE (UACMP) WITH MICROSCOPIC
Bilirubin Urine: NEGATIVE
Glucose, UA: NEGATIVE mg/dL
Ketones, ur: NEGATIVE mg/dL
NITRITE: NEGATIVE
PROTEIN: NEGATIVE mg/dL
Specific Gravity, Urine: 1.01 (ref 1.005–1.030)
Squamous Epithelial / LPF: NONE SEEN
pH: 6 (ref 5.0–8.0)

## 2017-05-19 LAB — HEMOGLOBIN AND HEMATOCRIT, BLOOD
HEMATOCRIT: 21.3 % — AB (ref 39.0–52.0)
Hemoglobin: 7.3 g/dL — ABNORMAL LOW (ref 13.0–17.0)

## 2017-05-19 LAB — TROPONIN I
TROPONIN I: 0.03 ng/mL — AB (ref <0.03)
Troponin I: <0.03 ng/mL (ref <0.03)

## 2017-05-19 LAB — CBC
HCT: 22.8 % — ABNORMAL LOW (ref 39.0–52.0)
HEMOGLOBIN: 7.8 g/dL — AB (ref 13.0–17.0)
MCH: 29.2 pg (ref 26.0–34.0)
MCHC: 34.2 g/dL (ref 30.0–36.0)
MCV: 85.4 fL (ref 78.0–100.0)
Platelets: 308 10*3/uL (ref 150–400)
RBC: 2.67 MIL/uL — AB (ref 4.22–5.81)
RDW: 19 % — ABNORMAL HIGH (ref 11.5–15.5)
WBC: 16.2 10*3/uL — AB (ref 4.0–10.5)

## 2017-05-19 LAB — MRSA PCR SCREENING: MRSA BY PCR: NEGATIVE

## 2017-05-19 LAB — STREP PNEUMONIAE URINARY ANTIGEN: STREP PNEUMO URINARY ANTIGEN: NEGATIVE

## 2017-05-19 MED ORDER — DEXTROSE 5 % IV SOLN
1.0000 g | Freq: Three times a day (TID) | INTRAVENOUS | Status: DC
  Administered 2017-05-19 – 2017-05-21 (×6): 1 g via INTRAVENOUS
  Filled 2017-05-19 (×8): qty 1

## 2017-05-19 MED ORDER — PRO-STAT SUGAR FREE PO LIQD
30.0000 mL | Freq: Three times a day (TID) | ORAL | Status: DC
  Administered 2017-05-19 – 2017-05-22 (×10): 30 mL via ORAL
  Filled 2017-05-19 (×10): qty 30

## 2017-05-19 MED ORDER — MORPHINE SULFATE (PF) 4 MG/ML IV SOLN
4.0000 mg | INTRAVENOUS | Status: DC | PRN
  Administered 2017-05-19 – 2017-05-21 (×14): 4 mg via INTRAVENOUS
  Filled 2017-05-19 (×14): qty 1

## 2017-05-19 MED ORDER — ACETAMINOPHEN 325 MG PO TABS
650.0000 mg | ORAL_TABLET | ORAL | Status: DC | PRN
  Administered 2017-05-19 – 2017-05-21 (×4): 650 mg via ORAL
  Filled 2017-05-19 (×4): qty 2

## 2017-05-19 MED ORDER — ADULT MULTIVITAMIN W/MINERALS CH
1.0000 | ORAL_TABLET | Freq: Every day | ORAL | Status: DC
  Administered 2017-05-19 – 2017-05-22 (×4): 1 via ORAL
  Filled 2017-05-19 (×4): qty 1

## 2017-05-19 MED ORDER — DIAZEPAM 2 MG PO TABS
2.0000 mg | ORAL_TABLET | Freq: Once | ORAL | Status: AC
  Administered 2017-05-19: 2 mg via ORAL
  Filled 2017-05-19: qty 1

## 2017-05-19 NOTE — Progress Notes (Signed)
Pharmacy Antibiotic Note  Gregory [name]. is a 56 y.o. male was hospitalized 9/14-9/17 for kidney stones and is s/p percutaneous nephrostomy tube placement 04/12/17.  She presented back to the ED from SNF on  05/18/2017 s/p fall. Vancomycin and cefepime started on admission for suspected PNA.  Today, 05/19/2017: - scr down to 0.94 (crcl~67) - Tmax 100.5, wbc 16.2 - vancomycin d/ced d/t neg MRSA PCR    Plan: - adjust cefepime dose to 1 gm IV q8h for improving renal function - with negative MRSA PCR, consider d/c vancomycin  _________________________________  Height: 5' 11"  (180.3 cm) Weight: 119 lb 14.9 oz (54.4 kg) IBW/kg (Calculated) : 75.3  Temp (24hrs), Avg:99 F (37.2 C), Min:97.7 F (36.5 C), Max:100.5 F (38.1 C)   Recent Labs Lab 05/14/17 1502 05/14/17 1821 05/15/17 0430 05/16/17 0726 05/18/17 1628 05/18/17 1805 05/19/17 0430  WBC 52.7* 57.7* 56.5* 31.4* 16.2*  --  16.2*  CREATININE 1.02  --   --  1.63* 1.26*  --  0.94  LATICACIDVEN  --   --   --   --   --  0.56  --     Estimated Creatinine Clearance: 67.5 mL/min (by C-G formula based on SCr of 0.94 mg/dL).    Allergies  Allergen Reactions  . Methotrexate Derivatives Other (See Comments)    Anemia, low WBC, severe GI symptoms,   . Humira [Adalimumab] Other (See Comments)    Intolerance  . Penicillins Other (See Comments)    Has patient had a PCN reaction causing immediate rash, facial/tongue/throat swelling, SOB or lightheadedness with hypotension: Unknown Has patient had a PCN reaction causing severe rash involving mucus membranes or skin necrosis: Unknown Has patient had a PCN reaction that required hospitalization: Unknown Has patient had a PCN reaction occurring within the last 10 years: No If all of the above answers are "NO", then may proceed with Cephalosporin use.   . Remicade [Infliximab] Other (See Comments)    Intolerance   Antimicrobials this admission: 9/18 Vancomycin >>  9/18 Cefepime  >>    Dose adjustments this admission: --   Microbiology results: 9/18 BCx x2:  9/18 MRSA PCR (-) 9/18 ucx:  9/18 ur strep: neg 9/18 UA: rare bact, moderate leuk  Thank you for allowing pharmacy to be a part of this patient's care.  Lynelle Doctor 05/19/2017 9:37 AM

## 2017-05-19 NOTE — Progress Notes (Signed)
MD updated with critical troponin. Pt denies pain or discomfort.

## 2017-05-19 NOTE — Progress Notes (Signed)
PT Cancellation Note  Patient Details Name: Hillel Card. MRN: [id number] DOB: 10-Oct-1960   Cancelled Treatment:     PT order received but eval deferred at pt request 2* fatigue.  Pt states willing to participate in am.  Will follow.   Almendra Loria 05/19/2017, 1:40 PM

## 2017-05-19 NOTE — Progress Notes (Signed)
Attempted to change dressing to back. Pt refused. When educating pt, pt states "i dont need you to tell me what to do." Unable to complete dressing change

## 2017-05-19 NOTE — Progress Notes (Signed)
MD saw pt at bedside. Verbal order make NPO at midnight for procedure tomorrow 9/20

## 2017-05-19 NOTE — Progress Notes (Signed)
PROGRESS NOTE    Gregory [name].  SHF:[id number] DOB: Jul 31, 1961 DOA: 05/18/2017 PCP: Billie Ruddy, MD    Brief Narrative:  This is a 56 year old Caucasian male with history significant for Crohn's disease who was recently discharged to inpatient rehabilitation on 9/17 after a left percutaneous nephrolithotomy along with stent placement for treatment of a large left kidney stone. He was brought to the ED the following day for further evaluation after the patient fell 3 times at rehabilitation. He was noted to have acute symptomatic anemia with a hemoglobin of 6.7 for which he has received 1 unit of PRBC with improvement. He has had dark urine output as well as dark stools and he has also complained of some mild left upper quadrant and epigastric abdominal pain. He is also noted to have possible HCAP And has been started on treatment with IV vancomycin and cefepime. CT imaging of his abdomen and pelvis appears to demonstrate a poorly functioning stent on the left side with some developing hydronephrosis to the left kidney.  This morning he states that his abdominal pain has improved, but he continues to pass dark urine. He did have a mild fever overnight of 100.5 Fahrenheit which has resolved this morning. He has had no further bowel movements since admission and denies any acute complaints or concerns. No acute overnight events noted.  Assessment & Plan:   Principal Problem:   Anemia Active Problems:   Nephrolithiasis   Crohn's disease of small and large intestines with complication (HCC)   Depression   Protein-calorie malnutrition, severe   Acute symptomatic Anemia-improved with transfusion- with dizziness and shortness of breath, hypotension on admission 94/58. Hemoglobin 6.7 from 7.9-  9/16. Hgb over the past month- 8-9. S/p recent surgery.  Etiology- likely surgical or Post surgical blood loss. Renal stone study- 05/15/17- perinephric hematoma. Pt also reports melena and has  hematuria. - FOBT pending, continue to monitor with AM labs -Transfuse for Hgb<7  Possible bilateral HCAP- SOB which may be due to acute anemia or infection, also dry cough. Hypotension on admission. No fever, WBC- Improving- now 16. Immunocompromised pt. 2 view Chest x-ray- By basilar pulmonary opacity- atelectasis or infection. Patient reports uncontrolled abd and flank pain- likely cause of atelectasis on imaging, also seen on CT abd 9/15. - Will treat as HCAP with IV cefepime per pharm; DC Vancomycin - follow-up blood cultures drawn in ED; negative thus far - urine strep negative - incentive spirometry  Chest pain resolved with minimal troponin elevation -Continue to trend cardiac enzymes  Nephrolithiasis with L hydronephrosis- s/p Stent, nephrolithotomy 05/14/17. Renal stone study 9/15- Status post placement of a left ureteric stent. Stone fragments, Small volume left perinephric hematoma. -UA repeat -Urology evaluation appreciated; spoke with Dr. Diona Fanti  Crohn's disease - Currently asymptomatic; outpatient Entyvio treatment  DVT prophylaxis: SCD Code Status: Full  Family Communication: Spouse at bedside  Disposition Plan: To be determined  Consults called: None Admission status: Inpt, Tele   Consultants:   Urology- Dr. Diona Fanti   Procedures: None   Antimicrobials: IV Cefepime day 2; IV Vancomycin DC 9/19   Objective: Vitals:   05/18/17 2238 05/19/17 0443 05/19/17 0608 05/19/17 1023  BP:  109/70    Pulse:  83    Resp:  14    Temp: 100.2 F (37.9 C) (!) 100.5 F (38.1 C) 99.7 F (37.6 C) 98.7 F (37.1 C)  TempSrc: Oral Oral Oral Oral  SpO2:  94%    Weight:  Height:        Intake/Output Summary (Last 24 hours) at 05/19/17 1121 Last data filed at 05/19/17 0611  Gross per 24 hour  Intake             1730 ml  Output              775 ml  Net              955 ml   Filed Weights   05/18/17 1524 05/18/17 2133  Weight: 49.9 kg (110 lb) 54.4 kg  (119 lb 14.9 oz)    Examination:  General exam: Appears calm and comfortable  Respiratory system: Clear to auscultation. Respiratory effort normal. Cardiovascular system: S1 & S2 heard, RRR. No JVD, murmurs, rubs, gallops or clicks. No pedal edema. Gastrointestinal system: Abdomen is nondistended, soft and nontender. No organomegaly or masses felt. Normal bowel sounds heard. Central nervous system: Alert and oriented. No focal neurological deficits. Extremities: Symmetric 5 x 5 power. Skin: No rashes, lesions or ulcers Psychiatry: Judgement and insight appear normal. Mood & affect appropriate.     Data Reviewed: I have personally reviewed following labs and imaging studies  CBC:  Recent Labs Lab 05/14/17 1821 05/15/17 0430 05/16/17 0726 05/18/17 1628 05/19/17 0056 05/19/17 0430  WBC 57.7* 56.5* 31.4* 16.2*  --  16.2*  NEUTROABS  --   --   --  13.7*  --   --   HGB 8.5* 8.7* 7.9* 6.7* 7.3* 7.8*  HCT 25.7* 27.0* 23.4* 20.1* 21.3* 22.8*  MCV 91.1 91.2 87.6 86.6  --  85.4  PLT 419* 496* 324 318  --  161   Basic Metabolic Panel:  Recent Labs Lab 05/14/17 1502 05/16/17 0726 05/18/17 1628 05/19/17 0430  NA 138 136 136 135  K 4.0 4.8 3.5 3.3*  CL 115* 112* 106 107  CO2 15* 18* 22 22  GLUCOSE 160* 97 112* 121*  BUN 10 14 16 9   CREATININE 1.02 1.63* 1.26* 0.94  CALCIUM 7.3* 7.9* 8.2* 8.0*   GFR: Estimated Creatinine Clearance: 67.5 mL/min (by C-G formula based on SCr of 0.94 mg/dL). Liver Function Tests:  Recent Labs Lab 05/18/17 1628  AST 40  ALT 27  ALKPHOS 121  BILITOT 0.7  PROT 4.9*  ALBUMIN 1.7*   No results for input(s): LIPASE, AMYLASE in the last 168 hours. No results for input(s): AMMONIA in the last 168 hours. Coagulation Profile:  Recent Labs Lab 05/18/17 1628  INR 1.18   Cardiac Enzymes:  Recent Labs Lab 05/19/17 0856  TROPONINI 0.03*   BNP (last 3 results) No results for input(s): PROBNP in the last 8760 hours. HbA1C: No results  for input(s): HGBA1C in the last 72 hours. CBG: No results for input(s): GLUCAP in the last 168 hours. Lipid Profile: No results for input(s): CHOL, HDL, LDLCALC, TRIG, CHOLHDL, LDLDIRECT in the last 72 hours. Thyroid Function Tests: No results for input(s): TSH, T4TOTAL, FREET4, T3FREE, THYROIDAB in the last 72 hours. Anemia Panel: No results for input(s): VITAMINB12, FOLATE, FERRITIN, TIBC, IRON, RETICCTPCT in the last 72 hours. Sepsis Labs:  Recent Labs Lab 05/18/17 1805  LATICACIDVEN 0.56    Recent Results (from the past 240 hour(s))  MRSA PCR Screening     Status: None   Collection Time: 05/18/17 10:39 PM  Result Value Ref Range Status   MRSA by PCR NEGATIVE NEGATIVE Final    Comment:        The GeneXpert MRSA Assay (FDA approved for NASAL  specimens only), is one component of a comprehensive MRSA colonization surveillance program. It is not intended to diagnose MRSA infection nor to guide or monitor treatment for MRSA infections.          Radiology Studies: Ct Abdomen Pelvis Wo Contrast  Result Date: 05/19/2017 CLINICAL DATA:  Left flank pain. Decreased hemoglobin. Previous nephrolithotomy 05/14/2017. Left nephrostomy tube placement 04/12/2017. History of Crohn disease. EXAM: CT ABDOMEN AND PELVIS WITHOUT CONTRAST TECHNIQUE: Multidetector CT imaging of the abdomen and pelvis was performed following the standard protocol without IV contrast. COMPARISON:  05/15/2017 FINDINGS: Lower chest: Moderate bilateral pleural effusions with basilar atelectasis. Bullous emphysematous changes in the lung bases. Hepatobiliary: No focal liver abnormality is seen. Gallbladder is decompressed. No gallstones, gallbladder wall thickening, or biliary dilatation. Pancreas: Unremarkable. No pancreatic ductal dilatation or surrounding inflammatory changes. Spleen: Normal in size without focal abnormality. Adrenals/Urinary Tract: No adrenal gland nodules. Left ureteral stent is present with  proximal pigtail in the left renal pelvis and distal pigtail in the bladder. The left kidney is enlarged with respect to the right. There is moderate left hydronephrosis. This could indicate poor stent function or reflux. There is diffuse stranding around the left kidney and extending along the left pericolic gutter and ileus psoas region. Some of the material in the pararenal spaces of increased density consistent with hematoma. This is decreasing since the previous study suggesting interval evolution. There is focal extraluminal gas in the left pararenal fat, also decreasing since previous study. This may be postoperative. Small amount of gas within the left intrarenal collecting system and in the bladder is also likely the iatrogenic and is decreasing since previous study. Infection is not entirely excluded. Tiny intrarenal stones are demonstrated in the left. Tiny stone fragments in the left renal pelvis and in the left ureter along the catheter. Right kidney and ureter are unremarkable. No bladder wall thickening or bladder stones. Stomach/Bowel: Postoperative changes with a left lower quadrant colostomy and rectal stump. Stomach, small bowel, and colon are not abnormally distended. Wall thickening demonstrated in the ascending colon and in multiple small bowel loops mid abdominal small bowel anastomosis with dilated segment and wall thickening. Appearances are similar to previous study and are consistent with changes of Crohn's disease. Active enteritis/colitis not excluded. Vascular/Lymphatic: Normal caliber abdominal aorta. No calcification. Prominent retroperitoneal lymph nodes without pathologic enlargement are likely reactive. Reproductive: Prostate is unremarkable. Other: No free air or free fluid in the abdomen. Abdominal wall musculature appears intact. Musculoskeletal: Degenerative changes in the spine. No destructive bone lesions. IMPRESSION: 1. Postoperative changes in and around the left kidney are  improving since previous study. There is residual perinephric and ileus psoas hematoma, extraluminal gas in the left pararenal space, and left ureteral stent. 2. Multiple tiny stone fragments demonstrated in the left renal pelvis and along the left ureter. 3. Left hydronephrosis may indicate poor stent function. 4. Left lower quadrant colostomy. Multiple areas of small bowel and colonic wall thickening consistent with changes of Crohn's disease. Prominent retroperitoneal lymph nodes are likely reactive. 5. Bilateral pleural effusions with basilar atelectasis. Electronically Signed   By: Lucienne Capers M.D.   On: 05/19/2017 00:43   Dg Chest 2 View  Result Date: 05/18/2017 CLINICAL DATA:  Fall yesterday. Decreased hemoglobin. Shortness of breath. EXAM: CHEST  2 VIEW COMPARISON:  None. FINDINGS: Heart size is within normal limits. 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 No pneumothorax visualized. Small left pleural effusion versus pleural thickening. Bibasilar pulmonary opacity is also seen which may  be due to atelectasis, infection, or contusion. IMPRESSION: Bibasilar pulmonary opacity, which may be due to atelectasis, infection, or contusion . Small left hemothorax or other pleural effusion cannot be excluded. No pneumothorax visualized. Bullous emphysema. Electronically Signed   By: Earle Gell M.D.   On: 05/18/2017 16:59   Ct Head Wo Contrast  Result Date: 05/18/2017 CLINICAL DATA:  Unwitnessed fall at rehab facility, head and neck trauma EXAM: CT HEAD WITHOUT CONTRAST CT MAXILLOFACIAL WITHOUT CONTRAST CT CERVICAL SPINE WITHOUT CONTRAST TECHNIQUE: Multidetector CT imaging of the head, cervical spine, and maxillofacial structures were performed using the standard protocol without intravenous contrast. Multiplanar CT image reconstructions of the cervical spine and maxillofacial structures were also generated. COMPARISON:  None. FINDINGS: CT HEAD FINDINGS Brain: No evidence of acute infarction, hemorrhage,  hydrocephalus, extra-axial collection or mass lesion/mass effect. Vascular: No hyperdense vessel or unexpected calcification. Skull: Normal. Negative for fracture or focal lesion. Other: None. CT MAXILLOFACIAL FINDINGS Osseous: No fracture or mandibular dislocation. No destructive process. Orbits: Negative. No traumatic or inflammatory finding. Sinuses: Clear. Soft tissues: Negative. CT CERVICAL SPINE FINDINGS Alignment: Normal. Skull base and vertebrae: No acute fracture. No primary bone lesion or focal pathologic process. Soft tissues and spinal canal: No prevertebral fluid or swelling. No visible canal hematoma. Disc levels: Minor cervical degenerative spondylosis at C5-6 and C6-7. Preserved vertebral body heights. No acute osseous finding or fracture. Facets are aligned. Upper chest: Apical bullous disease noted. Other: None. IMPRESSION: Normal head CT without contrast.  No acute intracranial abnormality. No acute facial bony trauma or fracture. No acute cervical spine fracture or malalignment by CT. Minor cervical spondylosis. Electronically Signed   By: Jerilynn Mages.  Shick M.D.   On: 05/18/2017 17:34   Ct Cervical Spine Wo Contrast  Result Date: 05/18/2017 CLINICAL DATA:  Unwitnessed fall at rehab facility, head and neck trauma EXAM: CT HEAD WITHOUT CONTRAST CT MAXILLOFACIAL WITHOUT CONTRAST CT CERVICAL SPINE WITHOUT CONTRAST TECHNIQUE: Multidetector CT imaging of the head, cervical spine, and maxillofacial structures were performed using the standard protocol without intravenous contrast. Multiplanar CT image reconstructions of the cervical spine and maxillofacial structures were also generated. COMPARISON:  None. FINDINGS: CT HEAD FINDINGS Brain: No evidence of acute infarction, hemorrhage, hydrocephalus, extra-axial collection or mass lesion/mass effect. Vascular: No hyperdense vessel or unexpected calcification. Skull: Normal. Negative for fracture or focal lesion. Other: None. CT MAXILLOFACIAL FINDINGS Osseous:  No fracture or mandibular dislocation. No destructive process. Orbits: Negative. No traumatic or inflammatory finding. Sinuses: Clear. Soft tissues: Negative. CT CERVICAL SPINE FINDINGS Alignment: Normal. Skull base and vertebrae: No acute fracture. No primary bone lesion or focal pathologic process. Soft tissues and spinal canal: No prevertebral fluid or swelling. No visible canal hematoma. Disc levels: Minor cervical degenerative spondylosis at C5-6 and C6-7. Preserved vertebral body heights. No acute osseous finding or fracture. Facets are aligned. Upper chest: Apical bullous disease noted. Other: None. IMPRESSION: Normal head CT without contrast.  No acute intracranial abnormality. No acute facial bony trauma or fracture. No acute cervical spine fracture or malalignment by CT. Minor cervical spondylosis. Electronically Signed   By: Jerilynn Mages.  Shick M.D.   On: 05/18/2017 17:34   Ct Maxillofacial Wo Contrast  Result Date: 05/18/2017 CLINICAL DATA:  Unwitnessed fall at rehab facility, head and neck trauma EXAM: CT HEAD WITHOUT CONTRAST CT MAXILLOFACIAL WITHOUT CONTRAST CT CERVICAL SPINE WITHOUT CONTRAST TECHNIQUE: Multidetector CT imaging of the head, cervical spine, and maxillofacial structures were performed using the standard protocol without intravenous contrast. Multiplanar CT image reconstructions  of the cervical spine and maxillofacial structures were also generated. COMPARISON:  None. FINDINGS: CT HEAD FINDINGS Brain: No evidence of acute infarction, hemorrhage, hydrocephalus, extra-axial collection or mass lesion/mass effect. Vascular: No hyperdense vessel or unexpected calcification. Skull: Normal. Negative for fracture or focal lesion. Other: None. CT MAXILLOFACIAL FINDINGS Osseous: No fracture or mandibular dislocation. No destructive process. Orbits: Negative. No traumatic or inflammatory finding. Sinuses: Clear. Soft tissues: Negative. CT CERVICAL SPINE FINDINGS Alignment: Normal. Skull base and  vertebrae: No acute fracture. No primary bone lesion or focal pathologic process. Soft tissues and spinal canal: No prevertebral fluid or swelling. No visible canal hematoma. Disc levels: Minor cervical degenerative spondylosis at C5-6 and C6-7. Preserved vertebral body heights. No acute osseous finding or fracture. Facets are aligned. Upper chest: Apical bullous disease noted. Other: None. IMPRESSION: Normal head CT without contrast.  No acute intracranial abnormality. No acute facial bony trauma or fracture. No acute cervical spine fracture or malalignment by CT. Minor cervical spondylosis. Electronically Signed   By: Jerilynn Mages.  Shick M.D.   On: 05/18/2017 17:34        Scheduled Meds: . famotidine  20 mg Oral QHS  . feeding supplement (PRO-STAT SUGAR FREE 64)  30 mL Oral TID  . folic acid  1 mg Oral Daily  . multivitamin with minerals  1 tablet Oral Daily  . ondansetron (ZOFRAN) IV  4 mg Intravenous Q6H  . potassium chloride SA  20 mEq Oral Daily  . sertraline  50 mg Oral Daily  . tamsulosin  0.4 mg Oral Daily   Continuous Infusions: . ceFEPime (MAXIPIME) IV       LOS: 1 day    Time spent: 30 minutes    Haldon Carley Darleen Crocker, MD [facility] Pager [phone]  If 7PM-7AM, please contact night-coverage [email or web address] Password [email or web address] 05/19/2017, 11:21 AM

## 2017-05-19 NOTE — Progress Notes (Signed)
Patient assessed 2 days ago on 05/17/2017 see clinical social work note, no psychosocial changes reported. CSW spoke with patient and patient's friend Tonie Griffith). Patient granted CSW verbal permission to speak with friend, patient drowsy during assessment. Patient's friend reported that patient is from [facility] for rehab and that they did not do a bed hold so they are unaware if patient would be able to return. Patient's friend reported that she thinks patient will need to return to rehab at dc. CSW agreed to complete FL2 and assist with dc planning. PT consulted to see patient. CSW will continue to follow and assist with dc planning.   Abundio Miu, [place] Social Worker [facility] Cell#: [phone]

## 2017-05-19 NOTE — Progress Notes (Signed)
Initial Nutrition Assessment  DOCUMENTATION CODES:   Underweight, Severe malnutrition in context of chronic illness  INTERVENTION:   30 ml Prostat TID, each supplement provides 100 kcals and 15 grams protein.   Provide MVI daily  NUTRITION DIAGNOSIS:   Malnutrition (Severe) related to chronic illness (Crohn's disease/short gut syndrome) as evidenced by severe depletion of body fat, severe depletion of muscle mass.   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 Presents to ED with c/o three falls at SNF, shortness of breath upon exertion, and abdominal/flank pain.  Admitted for acute symptomatic anemia and possible HCAP.    Pt has multiple recurring admissions that include: 04/28/17- nephrostomy dysfunction/hydronephrosis left pyelonephritis. 04/20/17-failure to thrive, dehydration. 04/10/17- Left UPJ stone with left percutaneous nephrostomy tube placement 04/12/17. 05/14/17-elective surgery for kidney stone removal  Spoke with pt at bedside who reports appetite increased since being discharged to [facility] 9/17. States he eats 4-5 meals a day that consist of rice, yogurt, and chicken. Appetite is well this admission. Pt noted to gain 8 lbs since last admission date of 9/14. Pt sensitive to most supplementation products but can tolerate Prostat. RD to provide TID with MVI. Nutrition-Focused physical exam completed. Findings are severe fat depletion, severe muscle depletion, and no edema. Pt remains severely malnourished but looks to be on right track for wt gain. Educated pt multiple times on ways to gain wt after discharge,. Reinforced this education during visit.    Medications reviewed and include: folic acid, KCl, IV abx with D5   Labs reviewed: K 3.3 (L)  Diet Order:  Diet regular Room service appropriate? Yes; Fluid consistency: Thin  Skin:   (closed incision left flank)  Last BM:  9/18-ileostomy  Height:   Ht Readings from Last 1 Encounters:  05/18/17 5' 11"  (1.803 m)    Weight:   Wt Readings from Last 1 Encounters:  05/18/17 119 lb 14.9 oz (54.4 kg)    Ideal Body Weight:  78.2 kg  BMI:  Body mass index is 16.73 kg/m.  Estimated Nutritional Needs:   Kcal:  1800-2000 (33-37 kcal/kg)  Protein:  100-115 grams (1.8-2 g/kg)  Fluid:  >1.8 L/day  EDUCATION NEEDS:   No education needs identified at this time  [place], LDN Clinical Nutrition Pager # - [phone]

## 2017-05-19 NOTE — Consult Note (Signed)
Urology Consult Note   Requesting Attending Physician:  Rodena Goldmann, DO Service Providing Consult: Urology  Consulting Attending: Dr. Diona Fanti   Reason for Consult:  Left flank pain, hydroneprosis  HPI: Gregory Karnes. is seen in consultation for reasons noted above at the request of Rodena Goldmann, DO for evaluation of left hydronephrosis and flank pain.  This is a 56 y.o. male with a history of Crohn's disease s/p colostomy as well as nephrolithiasis s/p left PCNL on 05/14/17. The procedure was complicated by a small renal pelvis perforation, but the patient did well post-operatively aside from expected left flank pain. CT on POD1 revealed a stent in good position, with a small 61m stone fragment in the mid ureter alongside the stent.   The patient was discharged to a SNF after this PCN and Foley were removed. Unfortunately, the patient was readmitted on 05/18/17 after several falls at the SNF.   CT here revealed distal migration of his left ureteral stent, with several small stone fragments in the ureter, as well as worsened left hydronephrosis. Vitals and Cr normal. Equivocal UA, WBC 17k, down from 50k in house (likely related to previous steroid taper for Crohn's flare). No fevers, chills, nausea, or vomiting. On Cipro at the SNF.    Past Medical History: Past Medical History:  Diagnosis Date  . Acute endocarditis   . Cro[place]'s disease ([facility] [place]   . Depression   . Eating disorder   . GERD (gastroesophageal reflux disease)   . Kidney stones   . Short gut syndrome     Past Surgical History:  Past Surgical History:  Procedure Laterality Date  . ILEOSTOMY    . IR NEPHROSTOMY EXCHANGE LEFT  04/12/2017  . IR NEPHROSTOMY PLACEMENT LEFT  04/12/2017  . IR NEPHROSTOMY PLACEMENT LEFT  04/29/2017  . NEPHROLITHOTOMY Left 05/14/2017   Procedure: LEFT NEPHROLITHOTOMY PERCUTANEOUS WITH SURGEON ACCESS;  Surgeon: HArdis Hughs MD;  Location: [facility];  Service: Urology;  Laterality: Left;   . SMALL INTESTINE SURGERY    . SUBTOTAL COLECTOMY      Medication: Current Facility-Administered Medications  Medication Dose Route Frequency Provider Last Rate Last Dose  . acetaminophen (TYLENOL) tablet 650 mg  650 mg Oral Q4H PRN Schorr, KRhetta Mura NP   650 mg at 05/19/17 0518  . ceFEPIme (MAXIPIME) 1 g in dextrose 5 % 50 mL IVPB  1 g Intravenous Q8H Pham, Anh P, RPH   Stopped at 05/19/17 1538  . famotidine (PEPCID) tablet 20 mg  20 mg Oral QHS Emokpae, Ejiroghene E, MD   20 mg at 05/18/17 2237  . feeding supplement (PRO-STAT SUGAR FREE 64) liquid 30 mL  30 mL Oral TID SManuella Ghazi Pratik D, DO   30 mL at 05/19/17 1200  . folic acid (FOLVITE) tablet 1 mg  1 mg Oral Daily Emokpae, Ejiroghene E, MD   1 mg at 05/19/17 0913  . iopamidol (ISOVUE-300) 61 % injection 30 mL  30 mL Oral Once PRN Emokpae, Ejiroghene E, MD      . morphine 4 MG/ML injection 4 mg  4 mg Intravenous Q3H PRN Emokpae, Ejiroghene E, MD   4 mg at 05/19/17 1541  . multivitamin with minerals tablet 1 tablet  1 tablet Oral Daily SManuella Ghazi Pratik D, DO      . ondansetron (ZOFRAN) injection 4 mg  4 mg Intravenous Q6H Emokpae, Ejiroghene E, MD   4 mg at 05/19/17 1252  . potassium chloride SA (K-DUR,KLOR-CON) CR tablet 20 mEq  20 mEq Oral Daily Emokpae, Ejiroghene E, MD   20 mEq at 05/19/17 0912  . sertraline (ZOLOFT) tablet 50 mg  50 mg Oral Daily Emokpae, Ejiroghene E, MD   50 mg at 05/19/17 0913  . tamsulosin (FLOMAX) capsule 0.4 mg  0.4 mg Oral Daily Emokpae, Ejiroghene E, MD   0.4 mg at 05/19/17 0913    Allergies: Allergies  Allergen Reactions  . Methotrexate Derivatives Other (See Comments)    Anemia, low WBC, severe GI symptoms,   . Humira [Adalimumab] Other (See Comments)    Intolerance  . Penicillins Other (See Comments)    Has patient had a PCN reaction causing immediate rash, facial/tongue/throat swelling, SOB or lightheadedness with hypotension: Unknown Has patient had a PCN reaction causing severe rash involving mucus  membranes or skin necrosis: Unknown Has patient had a PCN reaction that required hospitalization: Unknown Has patient had a PCN reaction occurring within the last 10 years: No If all of the above answers are "NO", then may proceed with Cephalosporin use.   . Remicade [Infliximab] Other (See Comments)    Intolerance    Social History: Social History  Substance Use Topics  . Smoking status: Former Smoker    Packs/day: 1.00    Years: 20.00    Types: Cigarettes    Quit date: 08/31/2006  . Smokeless tobacco: Never Used  . Alcohol use No    Family History Family History  Problem Relation Age of Onset  . Heart disease Mother   . Diabetes Father   . Heart disease Father   . Heart attack Father   . Melanoma Sister   . Crohn's disease Brother   . Inflammatory bowel disease Brother   . Diabetes Paternal Grandmother     Review of Systems 10 systems were reviewed and are negative except as noted specifically in the HPI.  Objective   BP 113/79 (BP Location: Right Leg)   Pulse 83   Temp 98.8 F (37.1 C) (Oral)   Resp 14   Ht 5' 11"  (1.803 m)   Wt 54.4 kg (119 lb 14.9 oz)   SpO2 97%   BMI 16.73 kg/m   Physical Exam General: NAD, A&O, resting, appropriate HEENT: /AT, EOMI, MMM Pulmonary: Normal work of breathing Cardiovascular: HDS, adequate peripheral perfusion Abdomen: Soft, NTTP, nondistended. GU: Normal circumcised penis, + left CVA tenderness. Condom cath in place with clear yellow urine in bag.  Extremities: warm and well perfused Neuro: Appropriate, no focal neurological deficits  Most Recent Labs: Lab Results  Component Value Date   WBC 16.2 (H) 05/19/2017   HGB 7.8 (L) 05/19/2017   HCT 22.8 (L) 05/19/2017   PLT 308 05/19/2017    Lab Results  Component Value Date   NA 135 05/19/2017   K 3.3 (L) 05/19/2017   CL 107 05/19/2017   CO2 22 05/19/2017   BUN 9 05/19/2017   CREATININE 0.94 05/19/2017   CALCIUM 8.0 (L) 05/19/2017   MG 1.2 (L) 04/28/2017    PHOS 3.8 04/28/2017    IMAGING: Ct Abdomen Pelvis Wo Contrast: 05/19/2017  IMPRESSION: 1. Postoperative changes in and around the left kidney are improving since previous study. There is residual perinephric and ileus psoas hematoma, extraluminal gas in the left pararenal space, and left ureteral stent. 2. Multiple tiny stone fragments demonstrated in the left renal pelvis and along the left ureter. 3. Left hydronephrosis may indicate poor stent function. 4. Left lower quadrant colostomy. Multiple areas of small bowel and colonic wall thickening  consistent with changes of Crohn's disease. Prominent retroperitoneal lymph nodes are likely reactive. 5. Bilateral pleural effusions with basilar atelectasis. Electronically Signed   By: Lucienne Capers M.D.   On: 05/19/2017 00:43     ------  Assessment:  56 y.o. male with hx Crohns disease s/p colostomy, as well as a nephrolithiasis s/p L PCNL on 05/14/17. Foley and PCN removed in house prior to d/c. Admitted now after several falls at SNF, found on CT to have worsening left hydro likely related to distal migration of stent as well as several 3-110m stone fragments in the mid ureter. Normal Cr, no sign of infection.   Recommendations: - Will need cysto, ureteroscopy and stent exchange non-urgently to remove his ureteral stones.  - Will attempt to add this on for 05/20/17. Please make NPO at midnight.  - Urology will continue to follow     Thank you for this consult. Please contact the urology consult pager with any further questions/concerns.    I have discussed this case with Dr. MJess Barters  I have interviewed the patient.  I have reviewed imaging studies.  I agree with his assessment and plan.  We will see about getting him on tomorrow for this procedure.

## 2017-05-19 NOTE — Progress Notes (Signed)
At start of shift, reminded pt to call before emptying colostomy. Needed fecal occult sample. Pt with BM during shift. Pt in bathroom and emptying colostomy pouch. Reminded pt to let staff help and collect sample. Pt refused. Continues to empty pouch into toilet. Pt refused PT. C/o "feeling too weak." Encouraged pt to participate in some PT. REFUSED. MD updated. External cath placed. Urine blood tinged. MD updated. Attempted to changed dressing to back, pt refused twice, asking me to come back later.

## 2017-05-20 ENCOUNTER — Encounter (HOSPITAL_COMMUNITY): Payer: Self-pay

## 2017-05-20 ENCOUNTER — Encounter (HOSPITAL_COMMUNITY)
Admission: EM | Disposition: A | Discharge status: Skilled Nursing Facility | Payer: Self-pay | Source: Home / Self Care | Attending: Internal Medicine

## 2017-05-20 ENCOUNTER — Encounter: Payer: Self-pay | Admitting: Family Medicine

## 2017-05-20 ENCOUNTER — Inpatient Hospital Stay (HOSPITAL_COMMUNITY): Payer: Medicare Other | Admitting: Anesthesiology

## 2017-05-20 ENCOUNTER — Inpatient Hospital Stay (HOSPITAL_COMMUNITY): Payer: Medicare Other

## 2017-05-20 DIAGNOSIS — R609 Edema, unspecified: Secondary | ICD-10-CM

## 2017-05-20 HISTORY — PX: CYSTOSCOPY WITH STENT PLACEMENT: SHX5790

## 2017-05-20 LAB — BASIC METABOLIC PANEL
ANION GAP: 9 (ref 5–15)
BUN: 11 mg/dL (ref 6–20)
CO2: 24 mmol/L (ref 22–32)
Calcium: 8.6 mg/dL — ABNORMAL LOW (ref 8.9–10.3)
Chloride: 107 mmol/L (ref 101–111)
Creatinine, Ser: 0.84 mg/dL (ref 0.61–1.24)
Glucose, Bld: 92 mg/dL (ref 65–99)
POTASSIUM: 3.6 mmol/L (ref 3.5–5.1)
SODIUM: 140 mmol/L (ref 135–145)

## 2017-05-20 LAB — CBC
HCT: 27.7 % — ABNORMAL LOW (ref 39.0–52.0)
HEMOGLOBIN: 9.1 g/dL — AB (ref 13.0–17.0)
MCH: 28 pg (ref 26.0–34.0)
MCHC: 32.9 g/dL (ref 30.0–36.0)
MCV: 85.2 fL (ref 78.0–100.0)
Platelets: 344 10*3/uL (ref 150–400)
RBC: 3.25 MIL/uL — AB (ref 4.22–5.81)
RDW: 18.8 % — ABNORMAL HIGH (ref 11.5–15.5)
WBC: 13.6 10*3/uL — AB (ref 4.0–10.5)

## 2017-05-20 LAB — URINE CULTURE: Culture: NO GROWTH

## 2017-05-20 SURGERY — CYSTOSCOPY, WITH STENT INSERTION
Anesthesia: General | Laterality: Left

## 2017-05-20 MED ORDER — EPHEDRINE 5 MG/ML INJ
INTRAVENOUS | Status: AC
  Filled 2017-05-20: qty 10

## 2017-05-20 MED ORDER — PHENYLEPHRINE 40 MCG/ML (10ML) SYRINGE FOR IV PUSH (FOR BLOOD PRESSURE SUPPORT)
PREFILLED_SYRINGE | INTRAVENOUS | Status: DC | PRN
Start: 2017-05-20 — End: 2017-05-20
  Administered 2017-05-20 (×3): 80 ug via INTRAVENOUS
  Administered 2017-05-20: 160 ug via INTRAVENOUS

## 2017-05-20 MED ORDER — FENTANYL CITRATE (PF) 100 MCG/2ML IJ SOLN
INTRAMUSCULAR | Status: DC | PRN
  Administered 2017-05-20: 50 ug via INTRAVENOUS

## 2017-05-20 MED ORDER — PROMETHAZINE HCL 25 MG/ML IJ SOLN: 6.2500 mg | INTRAMUSCULAR | Status: DC | PRN

## 2017-05-20 MED ORDER — MIDAZOLAM HCL 2 MG/2ML IJ SOLN
INTRAMUSCULAR | Status: AC
  Filled 2017-05-20: qty 2

## 2017-05-20 MED ORDER — PROPOFOL 10 MG/ML IV BOLUS
INTRAVENOUS | Status: AC
  Filled 2017-05-20: qty 20

## 2017-05-20 MED ORDER — LIDOCAINE 2% (20 MG/ML) 5 ML SYRINGE
INTRAMUSCULAR | Status: DC | PRN
  Administered 2017-05-20: 100 mg via INTRAVENOUS

## 2017-05-20 MED ORDER — PHENYLEPHRINE 40 MCG/ML (10ML) SYRINGE FOR IV PUSH (FOR BLOOD PRESSURE SUPPORT)
PREFILLED_SYRINGE | INTRAVENOUS | Status: AC
  Filled 2017-05-20: qty 10

## 2017-05-20 MED ORDER — FENTANYL CITRATE (PF) 100 MCG/2ML IJ SOLN
INTRAMUSCULAR | Status: AC
  Filled 2017-05-20: qty 2

## 2017-05-20 MED ORDER — ONDANSETRON HCL 4 MG/2ML IJ SOLN
INTRAMUSCULAR | Status: DC | PRN
  Administered 2017-05-20: 4 mg via INTRAVENOUS

## 2017-05-20 MED ORDER — LACTATED RINGERS IV SOLN
INTRAVENOUS | Status: DC
  Administered 2017-05-20: 1000 mL via INTRAVENOUS

## 2017-05-20 MED ORDER — FENTANYL CITRATE (PF) 100 MCG/2ML IJ SOLN: 25.0000 ug | INTRAMUSCULAR | Status: DC | PRN

## 2017-05-20 MED ORDER — SODIUM CHLORIDE 0.9 % IV SOLN
INTRAVENOUS | Status: DC | PRN
  Administered 2017-05-20: 50 mL

## 2017-05-20 MED ORDER — DEXAMETHASONE SODIUM PHOSPHATE 10 MG/ML IJ SOLN
INTRAMUSCULAR | Status: DC | PRN
  Administered 2017-05-20: 10 mg via INTRAVENOUS

## 2017-05-20 MED ORDER — DEXAMETHASONE SODIUM PHOSPHATE 10 MG/ML IJ SOLN
INTRAMUSCULAR | Status: AC
  Filled 2017-05-20: qty 1

## 2017-05-20 MED ORDER — PROPOFOL 10 MG/ML IV BOLUS
INTRAVENOUS | Status: DC | PRN
  Administered 2017-05-20: 100 mg via INTRAVENOUS

## 2017-05-20 MED ORDER — EPHEDRINE SULFATE-NACL 50-0.9 MG/10ML-% IV SOSY
PREFILLED_SYRINGE | INTRAVENOUS | Status: DC | PRN
Start: 2017-05-20 — End: 2017-05-20
  Administered 2017-05-20 (×2): 10 mg via INTRAVENOUS

## 2017-05-20 MED ORDER — MIDAZOLAM HCL 5 MG/5ML IJ SOLN
INTRAMUSCULAR | Status: DC | PRN
  Administered 2017-05-20: 2 mg via INTRAVENOUS

## 2017-05-20 MED ORDER — LIDOCAINE 2% (20 MG/ML) 5 ML SYRINGE
INTRAMUSCULAR | Status: AC
  Filled 2017-05-20: qty 5

## 2017-05-20 MED ORDER — ONDANSETRON HCL 4 MG/2ML IJ SOLN
INTRAMUSCULAR | Status: AC
  Filled 2017-05-20: qty 2

## 2017-05-20 SURGICAL SUPPLY — 13 items
BAG URO CATCHER STRL LF (MISCELLANEOUS) ×3 IMPLANT
CATH INTERMIT  6FR 70CM (CATHETERS) ×3 IMPLANT
CLOTH BEACON ORANGE TIMEOUT ST (SAFETY) ×3 IMPLANT
COVER FOOTSWITCH UNIV (MISCELLANEOUS) ×3 IMPLANT
COVER SURGICAL LIGHT HANDLE (MISCELLANEOUS) ×3 IMPLANT
GLOVE BIOGEL M STRL SZ7.5 (GLOVE) ×3 IMPLANT
GOWN STRL REUS W/TWL LRG LVL3 (GOWN DISPOSABLE) ×6 IMPLANT
GUIDEWIRE STR DUAL SENSOR (WIRE) ×3 IMPLANT
MANIFOLD NEPTUNE II (INSTRUMENTS) ×3 IMPLANT
PACK CYSTO (CUSTOM PROCEDURE TRAY) ×3 IMPLANT
STENT CONTOUR 6FRX28X.038 (STENTS) ×3 IMPLANT
TUBING CONNECTING 10 (TUBING) ×2 IMPLANT
TUBING CONNECTING 10' (TUBING) ×1

## 2017-05-20 NOTE — Anesthesia Postprocedure Evaluation (Signed)
Anesthesia Post Note  Patient: Vint Pola.  Procedure(s) Performed: Procedure(s) (LRB): CYSTOSCOPY WITH STENT PLACEMENT (Left)     Patient location during evaluation: PACU Anesthesia Type: General Level of consciousness: awake and alert Pain management: pain level controlled Vital Signs Assessment: post-procedure vital signs reviewed and stable Respiratory status: spontaneous breathing, nonlabored ventilation and respiratory function stable Cardiovascular status: blood pressure returned to baseline and stable Postop Assessment: no apparent nausea or vomiting Anesthetic complications: no    Last Vitals:  Vitals:   05/20/17 1200 05/20/17 1215  BP: 104/65 100/70  Pulse: 82 84  Resp: 12 13  Temp: 36.6 C (!) 36.4 C  SpO2: 93% 97%    Last Pain:  Vitals:   05/20/17 1200  TempSrc:   PainSc: 0-No pain                 Audry Pili

## 2017-05-20 NOTE — Progress Notes (Signed)
PT Cancellation Note  Patient Details Name: Gregory [name]. MRN: [id number] DOB: [birth year]   Cancelled Treatment:    Reason Eval/Treat Not Completed: Patient not medically ready. Patient having procedure today. Will check back tomorrow.   Claretha Cooper 05/20/2017, 10:29 AM Tresa Endo PT [phone]

## 2017-05-20 NOTE — Progress Notes (Signed)
Pt has tolerated full liquids well, will advance to soft diet

## 2017-05-20 NOTE — Interval H&P Note (Signed)
History and Physical Interval Note: Plan to exchange his stent for stent migration down into the proximal ureter resulting in mild hydro and ongoing pain. 05/20/2017 10:28 AM  Gregory [name].  has presented today for surgery, with the diagnosis of need for left stent exchange  The various methods of treatment have been discussed with the patient and family. After consideration of risks, benefits and other options for treatment, the patient has consented to  Procedure(s): [place] (Left) as a surgical intervention .  The patient's history has been reviewed, patient examined, no change in status, stable for surgery.  I have reviewed the patient's chart and labs.  Questions were answered to the patient's satisfaction.     Louis Meckel W

## 2017-05-20 NOTE — Progress Notes (Signed)
PROGRESS NOTE    Gregory [name].  LOV:[id number] DOB: [birth year] DOA: 05/18/2017 PCP: Billie Ruddy, MD    Brief Narrative:  This is a 56 year old Caucasian male with history significant for Crohn's disease who was recently discharged to inpatient rehabilitation on 9/17 after a left percutaneous nephrolithotomy along with stent placement for treatment of a large left kidney stone. He was brought to the ED the following day for further evaluation after the patient fell 3 times at rehabilitation. He was noted to have acute symptomatic anemia with a hemoglobin of 6.7 for which he has received 1 unit of PRBC with improvement. He has had dark urine output as well as dark stools and he has also complained of some mild left upper quadrant and epigastric abdominal pain. He is also noted to have possible HCAP And has been started on treatment with IV vancomycin and cefepime. CT imaging of his abdomen and pelvis appears to demonstrate a poorly functioning stent on the left side with some developing hydronephrosis to the left kidney. This case was discussed with urology and plans are currently underway for cystoscopy with stent exchange for today.   This morning he denies any symptomatic complaints or concerns. No acute overnight events have been noted. He has remained nothing by mouth after midnight in anticipation for cystoscopy with stent exchange today.  Assessment & Plan:   Principal Problem:   Anemia Active Problems:   Nephrolithiasis   Crohn's disease of small and large intestines with complication (HCC)   Depression   Protein-calorie malnutrition, severe   Acute symptomatic Anemia-improved with transfusion; stable- with dizziness and shortness of breath, hypotension on admission 94/58. Hemoglobin 6.7 from 7.9-  9/16. Hgb over the past month- 8-9. S/p recent surgery.  Etiology- likely surgical or Post surgical blood loss. Renal stone study- 05/15/17- perinephric hematoma. Pt also reports  melena and has hematuria. - FOBT negative -Transfuse for Hgb<7  Possible bilateral HCAP-improving-  No fever, WBC- Improving- now 16. Immunocompromised pt. 2 view Chest x-ray- By basilar pulmonary opacity- atelectasis or infection. Patient reports uncontrolled abd and flank pain- likely cause of atelectasis on imaging, also seen on CT abd 9/15. - Will treat as HCAP with IV cefepime per pharm - follow-up blood cultures drawn in ED; negative thus far - urine with no growth on cultures - incentive spirometry  Chest pain resolved with minimal troponin elevation -trend with no elevations noted  Nephrolithiasis with L hydronephrosis- s/p Stent, nephrolithotomy 05/14/17. Renal stone study 9/15- Status post placement of a left ureteric stent. Stone fragments, Small volume left perinephric hematoma. -Urology for cystoscopy and stent exchange today -No growth on urine cultures  Crohn's disease - Currently asymptomatic; outpatient Entyvio treatment  DVT prophylaxis: SCD Code Status: Full  Family Communication: Spouse at bedside  Disposition Plan: To be determined  Consults called: None Admission status: Inpt, Tele   Consultants:   Urology- Dr. Diona Fanti   Procedures: Anticipated cystoscopy and stent exchange today   Antimicrobials: IV Cefepime day 3; IV Vancomycin DC 9/19   Objective: Vitals:   05/19/17 1400 05/19/17 2322 05/20/17 0431 05/20/17 0433  BP: 113/79 106/62 (!) 85/55 97/75  Pulse: 83 87 75   Resp:  18 14   Temp: 98.8 F (37.1 C) 100.2 F (37.9 C) 98.2 F (36.8 C)   TempSrc: Oral Oral Oral   SpO2: 97% 96% 92%   Weight:      Height:        Intake/Output Summary (Last 24 hours) at  05/20/17 0811 Last data filed at 05/20/17 8756  Gross per 24 hour  Intake              290 ml  Output             3500 ml  Net            -3210 ml   Filed Weights   05/18/17 1524 05/18/17 2133  Weight: 49.9 kg (110 lb) 54.4 kg (119 lb 14.9 oz)    Examination:  General  exam: Appears calm and comfortable  Respiratory system: Clear to auscultation. Respiratory effort normal. Cardiovascular system: S1 & S2 heard, RRR. No JVD, murmurs, rubs, gallops or clicks. No pedal edema. Gastrointestinal system: Abdomen is nondistended, soft and nontender. No organomegaly or masses felt. Normal bowel sounds heard. Central nervous system: Alert and oriented. No focal neurological deficits. Extremities: Symmetric 5 x 5 power. Skin: No rashes, lesions or ulcers Psychiatry: Judgement and insight appear normal. Mood & affect appropriate.     Data Reviewed: I have personally reviewed following labs and imaging studies  CBC:  Recent Labs Lab 05/15/17 0430 05/16/17 0726 05/18/17 1628 05/19/17 0056 05/19/17 0430 05/20/17 0409  WBC 56.5* 31.4* 16.2*  --  16.2* 13.6*  NEUTROABS  --   --  13.7*  --   --   --   HGB 8.7* 7.9* 6.7* 7.3* 7.8* 9.1*  HCT 27.0* 23.4* 20.1* 21.3* 22.8* 27.7*  MCV 91.2 87.6 86.6  --  85.4 85.2  PLT 496* 324 318  --  308 433   Basic Metabolic Panel:  Recent Labs Lab 05/14/17 1502 05/16/17 0726 05/18/17 1628 05/19/17 0430 05/20/17 0409  NA 138 136 136 135 140  K 4.0 4.8 3.5 3.3* 3.6  CL 115* 112* 106 107 107  CO2 15* 18* 22 22 24   GLUCOSE 160* 97 112* 121* 92  BUN 10 14 16 9 11   CREATININE 1.02 1.63* 1.26* 0.94 0.84  CALCIUM 7.3* 7.9* 8.2* 8.0* 8.6*   GFR: Estimated Creatinine Clearance: 75.6 mL/min (by C-G formula based on SCr of 0.84 mg/dL). Liver Function Tests:  Recent Labs Lab 05/18/17 1628  AST 40  ALT 27  ALKPHOS 121  BILITOT 0.7  PROT 4.9*  ALBUMIN 1.7*   No results for input(s): LIPASE, AMYLASE in the last 168 hours. No results for input(s): AMMONIA in the last 168 hours. Coagulation Profile:  Recent Labs Lab 05/18/17 1628  INR 1.18   Cardiac Enzymes:  Recent Labs Lab 05/19/17 0856 05/19/17 1440 05/19/17 2033  TROPONINI 0.03* <0.03 <0.03   BNP (last 3 results) No results for input(s): PROBNP in  the last 8760 hours. HbA1C: No results for input(s): HGBA1C in the last 72 hours. CBG: No results for input(s): GLUCAP in the last 168 hours. Lipid Profile: No results for input(s): CHOL, HDL, LDLCALC, TRIG, CHOLHDL, LDLDIRECT in the last 72 hours. Thyroid Function Tests: No results for input(s): TSH, T4TOTAL, FREET4, T3FREE, THYROIDAB in the last 72 hours. Anemia Panel: No results for input(s): VITAMINB12, FOLATE, FERRITIN, TIBC, IRON, RETICCTPCT in the last 72 hours. Sepsis Labs:  Recent Labs Lab 05/18/17 1805  LATICACIDVEN 0.56    Recent Results (from the past 240 hour(s))  Blood culture (routine x 2)     Status: None (Preliminary result)   Collection Time: 05/18/17  5:55 PM  Result Value Ref Range Status   Specimen Description BLOOD LEFT ARM  Final   Special Requests   Final    BOTTLES DRAWN AEROBIC  AND ANAEROBIC Blood Culture adequate volume   Culture   Final    NO GROWTH < 24 HOURS Performed at [facility], [place] [address], [place], [place] [address]    Report Status PENDING  Incomplete  Culture, Urine     Status: None   Collection Time: 05/18/17  7:00 PM  Result Value Ref Range Status   Specimen Description URINE, RANDOM  Final   Special Requests NONE  Final   Culture   Final    NO GROWTH Performed at [facility], [place] [address] [place], [place] [address]    Report Status 05/20/2017 FINAL  Final  MRSA PCR Screening     Status: None   Collection Time: 05/18/17 10:39 PM  Result Value Ref Range Status   MRSA by PCR NEGATIVE NEGATIVE Final    Comment:        The GeneXpert MRSA Assay (FDA approved for NASAL specimens only), is one component of a comprehensive MRSA colonization surveillance program. It is not intended to diagnose MRSA infection nor to guide or monitor treatment for MRSA infections.          Radiology Studies: Ct Abdomen Pelvis Wo Contrast  Result Date: 05/19/2017 CLINICAL DATA:  Left flank pain. Decreased hemoglobin.  Previous nephrolithotomy 05/14/2017. Left nephrostomy tube placement 04/12/2017. History of Crohn disease. EXAM: CT ABDOMEN AND PELVIS WITHOUT CONTRAST TECHNIQUE: Multidetector CT imaging of the abdomen and pelvis was performed following the standard protocol without IV contrast. COMPARISON:  05/15/2017 FINDINGS: Lower chest: Moderate bilateral pleural effusions with basilar atelectasis. Bullous emphysematous changes in the lung bases. Hepatobiliary: No focal liver abnormality is seen. Gallbladder is decompressed. No gallstones, gallbladder wall thickening, or biliary dilatation. Pancreas: Unremarkable. No pancreatic ductal dilatation or surrounding inflammatory changes. Spleen: Normal in size without focal abnormality. Adrenals/Urinary Tract: No adrenal gland nodules. Left ureteral stent is present with proximal pigtail in the left renal pelvis and distal pigtail in the bladder. The left kidney is enlarged with respect to the right. There is moderate left hydronephrosis. This could indicate poor stent function or reflux. There is diffuse stranding around the left kidney and extending along the left pericolic gutter and ileus psoas region. Some of the material in the pararenal spaces of increased density consistent with hematoma. This is decreasing since the previous study suggesting interval evolution. There is focal extraluminal gas in the left pararenal fat, also decreasing since previous study. This may be postoperative. Small amount of gas within the left intrarenal collecting system and in the bladder is also likely the iatrogenic and is decreasing since previous study. Infection is not entirely excluded. Tiny intrarenal stones are demonstrated in the left. Tiny stone fragments in the left renal pelvis and in the left ureter along the catheter. Right kidney and ureter are unremarkable. No bladder wall thickening or bladder stones. Stomach/Bowel: Postoperative changes with a left lower quadrant colostomy and  rectal stump. Stomach, small bowel, and colon are not abnormally distended. Wall thickening demonstrated in the ascending colon and in multiple small bowel loops mid abdominal small bowel anastomosis with dilated segment and wall thickening. Appearances are similar to previous study and are consistent with changes of Crohn's disease. Active enteritis/colitis not excluded. Vascular/Lymphatic: Normal caliber abdominal aorta. No calcification. Prominent retroperitoneal lymph nodes without pathologic enlargement are likely reactive. Reproductive: Prostate is unremarkable. Other: No free air or free fluid in the abdomen. Abdominal wall musculature appears intact. Musculoskeletal: Degenerative changes in the spine. No destructive bone lesions. IMPRESSION:  1. Postoperative changes in and around the left kidney are improving since previous study. There is residual perinephric and ileus psoas hematoma, extraluminal gas in the left pararenal space, and left ureteral stent. 2. Multiple tiny stone fragments demonstrated in the left renal pelvis and along the left ureter. 3. Left hydronephrosis may indicate poor stent function. 4. Left lower quadrant colostomy. Multiple areas of small bowel and colonic wall thickening consistent with changes of Crohn's disease. Prominent retroperitoneal lymph nodes are likely reactive. 5. Bilateral pleural effusions with basilar atelectasis. Electronically Signed   By: Lucienne Capers M.D.   On: 05/19/2017 00:43   Dg Chest 2 View  Result Date: 05/18/2017 CLINICAL DATA:  Fall yesterday. Decreased hemoglobin. Shortness of breath. EXAM: CHEST  2 VIEW COMPARISON:  None. FINDINGS: Heart size is within normal limits. 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 No pneumothorax visualized. Small left pleural effusion versus pleural thickening. Bibasilar pulmonary opacity is also seen which may be due to atelectasis, infection, or contusion. IMPRESSION: Bibasilar pulmonary opacity, which may be due to  atelectasis, infection, or contusion . Small left hemothorax or other pleural effusion cannot be excluded. No pneumothorax visualized. Bullous emphysema. Electronically Signed   By: Earle Gell M.D.   On: 05/18/2017 16:59   Ct Head Wo Contrast  Result Date: 05/18/2017 CLINICAL DATA:  Unwitnessed fall at rehab facility, head and neck trauma EXAM: CT HEAD WITHOUT CONTRAST CT MAXILLOFACIAL WITHOUT CONTRAST CT CERVICAL SPINE WITHOUT CONTRAST TECHNIQUE: Multidetector CT imaging of the head, cervical spine, and maxillofacial structures were performed using the standard protocol without intravenous contrast. Multiplanar CT image reconstructions of the cervical spine and maxillofacial structures were also generated. COMPARISON:  None. FINDINGS: CT HEAD FINDINGS Brain: No evidence of acute infarction, hemorrhage, hydrocephalus, extra-axial collection or mass lesion/mass effect. Vascular: No hyperdense vessel or unexpected calcification. Skull: Normal. Negative for fracture or focal lesion. Other: None. CT MAXILLOFACIAL FINDINGS Osseous: No fracture or mandibular dislocation. No destructive process. Orbits: Negative. No traumatic or inflammatory finding. Sinuses: Clear. Soft tissues: Negative. CT CERVICAL SPINE FINDINGS Alignment: Normal. Skull base and vertebrae: No acute fracture. No primary bone lesion or focal pathologic process. Soft tissues and spinal canal: No prevertebral fluid or swelling. No visible canal hematoma. Disc levels: Minor cervical degenerative spondylosis at C5-6 and C6-7. Preserved vertebral body heights. No acute osseous finding or fracture. Facets are aligned. Upper chest: Apical bullous disease noted. Other: None. IMPRESSION: Normal head CT without contrast.  No acute intracranial abnormality. No acute facial bony trauma or fracture. No acute cervical spine fracture or malalignment by CT. Minor cervical spondylosis. Electronically Signed   By: Jerilynn Mages.  Shick M.D.   On: 05/18/2017 17:34   Ct Cervical  Spine Wo Contrast  Result Date: 05/18/2017 CLINICAL DATA:  Unwitnessed fall at rehab facility, head and neck trauma EXAM: CT HEAD WITHOUT CONTRAST CT MAXILLOFACIAL WITHOUT CONTRAST CT CERVICAL SPINE WITHOUT CONTRAST TECHNIQUE: Multidetector CT imaging of the head, cervical spine, and maxillofacial structures were performed using the standard protocol without intravenous contrast. Multiplanar CT image reconstructions of the cervical spine and maxillofacial structures were also generated. COMPARISON:  None. FINDINGS: CT HEAD FINDINGS Brain: No evidence of acute infarction, hemorrhage, hydrocephalus, extra-axial collection or mass lesion/mass effect. Vascular: No hyperdense vessel or unexpected calcification. Skull: Normal. Negative for fracture or focal lesion. Other: None. CT MAXILLOFACIAL FINDINGS Osseous: No fracture or mandibular dislocation. No destructive process. Orbits: Negative. No traumatic or inflammatory finding. Sinuses: Clear. Soft tissues: Negative. CT CERVICAL SPINE FINDINGS Alignment: Normal. Skull base and  vertebrae: No acute fracture. No primary bone lesion or focal pathologic process. Soft tissues and spinal canal: No prevertebral fluid or swelling. No visible canal hematoma. Disc levels: Minor cervical degenerative spondylosis at C5-6 and C6-7. Preserved vertebral body heights. No acute osseous finding or fracture. Facets are aligned. Upper chest: Apical bullous disease noted. Other: None. IMPRESSION: Normal head CT without contrast.  No acute intracranial abnormality. No acute facial bony trauma or fracture. No acute cervical spine fracture or malalignment by CT. Minor cervical spondylosis. Electronically Signed   By: Jerilynn Mages.  Shick M.D.   On: 05/18/2017 17:34   Ct Maxillofacial Wo Contrast  Result Date: 05/18/2017 CLINICAL DATA:  Unwitnessed fall at rehab facility, head and neck trauma EXAM: CT HEAD WITHOUT CONTRAST CT MAXILLOFACIAL WITHOUT CONTRAST CT CERVICAL SPINE WITHOUT CONTRAST TECHNIQUE:  Multidetector CT imaging of the head, cervical spine, and maxillofacial structures were performed using the standard protocol without intravenous contrast. Multiplanar CT image reconstructions of the cervical spine and maxillofacial structures were also generated. COMPARISON:  None. FINDINGS: CT HEAD FINDINGS Brain: No evidence of acute infarction, hemorrhage, hydrocephalus, extra-axial collection or mass lesion/mass effect. Vascular: No hyperdense vessel or unexpected calcification. Skull: Normal. Negative for fracture or focal lesion. Other: None. CT MAXILLOFACIAL FINDINGS Osseous: No fracture or mandibular dislocation. No destructive process. Orbits: Negative. No traumatic or inflammatory finding. Sinuses: Clear. Soft tissues: Negative. CT CERVICAL SPINE FINDINGS Alignment: Normal. Skull base and vertebrae: No acute fracture. No primary bone lesion or focal pathologic process. Soft tissues and spinal canal: No prevertebral fluid or swelling. No visible canal hematoma. Disc levels: Minor cervical degenerative spondylosis at C5-6 and C6-7. Preserved vertebral body heights. No acute osseous finding or fracture. Facets are aligned. Upper chest: Apical bullous disease noted. Other: None. IMPRESSION: Normal head CT without contrast.  No acute intracranial abnormality. No acute facial bony trauma or fracture. No acute cervical spine fracture or malalignment by CT. Minor cervical spondylosis. Electronically Signed   By: Jerilynn Mages.  Shick M.D.   On: 05/18/2017 17:34        Scheduled Meds: . famotidine  20 mg Oral QHS  . feeding supplement (PRO-STAT SUGAR FREE 64)  30 mL Oral TID  . folic acid  1 mg Oral Daily  . multivitamin with minerals  1 tablet Oral Daily  . ondansetron (ZOFRAN) IV  4 mg Intravenous Q6H  . potassium chloride SA  20 mEq Oral Daily  . sertraline  50 mg Oral Daily  . tamsulosin  0.4 mg Oral Daily   Continuous Infusions: . ceFEPime (MAXIPIME) IV 1 g (05/20/17 0605)     LOS: 2 days    Time  spent: 30 minutes    Kateryna Grantham Darleen Crocker, MD [facility] Pager [phone]  If 7PM-7AM, please contact night-coverage [email or web address] Password [email or web address] 05/20/2017, 8:11 AM

## 2017-05-20 NOTE — Transfer of Care (Signed)
Immediate Anesthesia Transfer of Care Note  Patient: Gregory [name].  Procedure(s) Performed: Procedure(s): CYSTOSCOPY WITH STENT PLACEMENT (Left)  Patient Location: PACU  Anesthesia Type:General  Level of Consciousness: sedated  Airway & Oxygen Therapy: Patient Spontanous Breathing and Patient connected to face mask oxygen  Post-op Assessment: Report given to RN and Post -op Vital signs reviewed and stable  Post vital signs: Reviewed and stable  Last Vitals:  Vitals:   05/20/17 0431 05/20/17 0433  BP: (!) 85/55 97/75  Pulse: 75   Resp: 14   Temp: 36.8 C   SpO2: 92%     Last Pain:  Vitals:   05/20/17 0911  TempSrc:   PainSc: 6       Patients Stated Pain Goal: 3 (85/46/27 0350)  Complications: No apparent anesthesia complications

## 2017-05-20 NOTE — H&P (View-Only) (Signed)
Urology Consult Note   Requesting Attending Physician:  Rodena Goldmann, DO Service Providing Consult: Urology  Consulting Attending: Dr. Diona Fanti   Reason for Consult:  Left flank pain, hydroneprosis  HPI: Gregory [name]. is seen in consultation for reasons noted above at the request of Rodena Goldmann, DO for evaluation of left hydronephrosis and flank pain.  This is a 55 y.o. male with a history of Crohn's disease s/p colostomy as well as nephrolithiasis s/p left PCNL on 05/14/17. The procedure was complicated by a small renal pelvis perforation, but the patient did well post-operatively aside from expected left flank pain. CT on POD1 revealed a stent in good position, with a small 29m stone fragment in the mid ureter alongside the stent.   The patient was discharged to a SNF after this PCN and Foley were removed. Unfortunately, the patient was readmitted on 05/18/17 after several falls at the SNF.   CT here revealed distal migration of his left ureteral stent, with several small stone fragments in the ureter, as well as worsened left hydronephrosis. Vitals and Cr normal. Equivocal UA, WBC 17k, down from 50k in house (likely related to previous steroid taper for Crohn's flare). No fevers, chills, nausea, or vomiting. On Cipro at the SNF.    Past Medical History: Past Medical History:  Diagnosis Date  . Acute endocarditis   . Cro[place]'s disease [place]   . Depression   . Eating disorder   . GERD (gastroesophageal reflux disease)   . Kidney stones   . Short gut syndrome     Past Surgical History:  Past Surgical History:  Procedure Laterality Date  . ILEOSTOMY    . IR NEPHROSTOMY EXCHANGE LEFT  04/12/2017  . IR NEPHROSTOMY PLACEMENT LEFT  04/12/2017  . IR NEPHROSTOMY PLACEMENT LEFT  04/29/2017  . NEPHROLITHOTOMY Left 05/14/2017   Procedure: LEFT NEPHROLITHOTOMY PERCUTANEOUS WITH SURGEON ACCESS;  Surgeon: HArdis Hughs MD;  Location: [facility];  Service: Urology;  Laterality: Left;   . SMALL INTESTINE SURGERY    . SUBTOTAL COLECTOMY      Medication: Current Facility-Administered Medications  Medication Dose Route Frequency Provider Last Rate Last Dose  . acetaminophen (TYLENOL) tablet 650 mg  650 mg Oral Q4H PRN Schorr, KRhetta Mura NP   650 mg at 05/19/17 0518  . ceFEPIme (MAXIPIME) 1 g in dextrose 5 % 50 mL IVPB  1 g Intravenous Q8H Pham, Anh P, RPH   Stopped at 05/19/17 1538  . famotidine (PEPCID) tablet 20 mg  20 mg Oral QHS Emokpae, Ejiroghene E, MD   20 mg at 05/18/17 2237  . feeding supplement (PRO-STAT SUGAR FREE 64) liquid 30 mL  30 mL Oral TID SManuella Ghazi Pratik D, DO   30 mL at 05/19/17 1200  . folic acid (FOLVITE) tablet 1 mg  1 mg Oral Daily Emokpae, Ejiroghene E, MD   1 mg at 05/19/17 0913  . iopamidol (ISOVUE-300) 61 % injection 30 mL  30 mL Oral Once PRN Emokpae, Ejiroghene E, MD      . morphine 4 MG/ML injection 4 mg  4 mg Intravenous Q3H PRN Emokpae, Ejiroghene E, MD   4 mg at 05/19/17 1541  . multivitamin with minerals tablet 1 tablet  1 tablet Oral Daily SManuella Ghazi Pratik D, DO      . ondansetron (ZOFRAN) injection 4 mg  4 mg Intravenous Q6H Emokpae, Ejiroghene E, MD   4 mg at 05/19/17 1252  . potassium chloride SA (K-DUR,KLOR-CON) CR tablet 20 mEq  20 mEq Oral Daily Emokpae, Ejiroghene E, MD   20 mEq at 05/19/17 0912  . sertraline (ZOLOFT) tablet 50 mg  50 mg Oral Daily Emokpae, Ejiroghene E, MD   50 mg at 05/19/17 0913  . tamsulosin (FLOMAX) capsule 0.4 mg  0.4 mg Oral Daily Emokpae, Ejiroghene E, MD   0.4 mg at 05/19/17 0913    Allergies: Allergies  Allergen Reactions  . Methotrexate Derivatives Other (See Comments)    Anemia, low WBC, severe GI symptoms,   . Humira [Adalimumab] Other (See Comments)    Intolerance  . Penicillins Other (See Comments)    Has patient had a PCN reaction causing immediate rash, facial/tongue/throat swelling, SOB or lightheadedness with hypotension: Unknown Has patient had a PCN reaction causing severe rash involving mucus  membranes or skin necrosis: Unknown Has patient had a PCN reaction that required hospitalization: Unknown Has patient had a PCN reaction occurring within the last 10 years: No If all of the above answers are "NO", then may proceed with Cephalosporin use.   . Remicade [Infliximab] Other (See Comments)    Intolerance    Social History: Social History  Substance Use Topics  . Smoking status: Former Smoker    Packs/day: 1.00    Years: 20.00    Types: Cigarettes    Quit date: 08/31/2006  . Smokeless tobacco: Never Used  . Alcohol use No    Family History Family History  Problem Relation Age of Onset  . Heart disease Mother   . Diabetes Father   . Heart disease Father   . Heart attack Father   . Melanoma Sister   . Crohn's disease Brother   . Inflammatory bowel disease Brother   . Diabetes Paternal Grandmother     Review of Systems 10 systems were reviewed and are negative except as noted specifically in the HPI.  Objective   BP 113/79 (BP Location: Right Leg)   Pulse 83   Temp 98.8 F (37.1 C) (Oral)   Resp 14   Ht 5' 11"  (1.803 m)   Wt 54.4 kg (119 lb 14.9 oz)   SpO2 97%   BMI 16.73 kg/m   Physical Exam General: NAD, A&O, resting, appropriate HEENT: [place]/AT, EOMI, MMM Pulmonary: Normal work of breathing Cardiovascular: HDS, adequate peripheral perfusion Abdomen: Soft, NTTP, nondistended. GU: Normal circumcised penis, + left CVA tenderness. Condom cath in place with clear yellow urine in bag.  Extremities: warm and well perfused Neuro: Appropriate, no focal neurological deficits  Most Recent Labs: Lab Results  Component Value Date   WBC 16.2 (H) 05/19/2017   HGB 7.8 (L) 05/19/2017   HCT 22.8 (L) 05/19/2017   PLT 308 05/19/2017    Lab Results  Component Value Date   NA 135 05/19/2017   K 3.3 (L) 05/19/2017   CL 107 05/19/2017   CO2 22 05/19/2017   BUN 9 05/19/2017   CREATININE 0.94 05/19/2017   CALCIUM 8.0 (L) 05/19/2017   MG 1.2 (L) 04/28/2017    PHOS 3.8 04/28/2017    IMAGING: Ct Abdomen Pelvis Wo Contrast: 05/19/2017  IMPRESSION: 1. Postoperative changes in and around the left kidney are improving since previous study. There is residual perinephric and ileus psoas hematoma, extraluminal gas in the left pararenal space, and left ureteral stent. 2. Multiple tiny stone fragments demonstrated in the left renal pelvis and along the left ureter. 3. Left hydronephrosis may indicate poor stent function. 4. Left lower quadrant colostomy. Multiple areas of small bowel and colonic wall thickening  consistent with changes of Crohn's disease. Prominent retroperitoneal lymph nodes are likely reactive. 5. Bilateral pleural effusions with basilar atelectasis. Electronically Signed   By: Lucienne Capers M.D.   On: 05/19/2017 00:43     ------  Assessment:  56 y.o. male with hx Crohns disease s/p colostomy, as well as a nephrolithiasis s/p L PCNL on 05/14/17. Foley and PCN removed in house prior to d/c. Admitted now after several falls at SNF, found on CT to have worsening left hydro likely related to distal migration of stent as well as several 3-41m stone fragments in the mid ureter. Normal Cr, no sign of infection.   Recommendations: - Will need cysto, ureteroscopy and stent exchange non-urgently to remove his ureteral stones.  - Will attempt to add this on for 05/20/17. Please make NPO at midnight.  - Urology will continue to follow     Thank you for this consult. Please contact the urology consult pager with any further questions/concerns.    I have discussed this case with Dr. MJess Barters  I have interviewed the patient.  I have reviewed imaging studies.  I agree with his assessment and plan.  We will see about getting him on tomorrow for this procedure.

## 2017-05-20 NOTE — Anesthesia Procedure Notes (Signed)
Procedure Name: LMA Insertion Date/Time: 05/20/2017 10:57 AM Performed by: Lind Covert Pre-anesthesia Checklist: Patient identified, Emergency Drugs available, Suction available, Patient being monitored and Timeout performed Patient Re-evaluated:Patient Re-evaluated prior to induction LMA: LMA inserted LMA Size: 4.0 Number of attempts: 1 Placement Confirmation: positive ETCO2 and breath sounds checked- equal and bilateral Tube secured with: Tape Dental Injury: Teeth and Oropharynx as per pre-operative assessment

## 2017-05-20 NOTE — Progress Notes (Signed)
Did dressing change on right lower back.

## 2017-05-20 NOTE — Anesthesia Preprocedure Evaluation (Addendum)
Anesthesia Evaluation  Patient identified by MRN, date of birth, ID band Patient awake    Reviewed: Allergy & Precautions, NPO status , Patient's Chart, lab work & pertinent test results  Airway Mallampati: II  TM Distance: >3 FB Neck ROM: Full    Dental  (+) Edentulous Lower, Edentulous Upper   Pulmonary former smoker,    Pulmonary exam normal breath sounds clear to auscultation       Cardiovascular negative cardio ROS Normal cardiovascular exam Rhythm:Regular Rate:Normal  Hx endocarditis   Neuro/Psych PSYCHIATRIC DISORDERS Depression negative neurological ROS     GI/Hepatic Neg liver ROS, GERD  Medicated,Crohn's disease, short gut syndrome   Endo/Other  negative endocrine ROS  Renal/GU negative Renal ROS  negative genitourinary   Musculoskeletal negative musculoskeletal ROS (+)   Abdominal   Peds negative pediatric ROS (+)  Hematology  (+) anemia ,   Anesthesia Other Findings   Reproductive/Obstetrics negative OB ROS                            Anesthesia Physical  Anesthesia Plan  ASA: II  Anesthesia Plan: General   Post-op Pain Management:    Induction: Intravenous  PONV Risk Score and Plan: 3 and Ondansetron, Dexamethasone, Midazolam and Treatment may vary due to age or medical condition  Airway Management Planned: LMA  Additional Equipment: None  Intra-op Plan:   Post-operative Plan: Extubation in OR  Informed Consent: I have reviewed the patients History and Physical, chart, labs and discussed the procedure including the risks, benefits and alternatives for the proposed anesthesia with the patient or authorized representative who has indicated his/her understanding and acceptance.   Dental advisory given  Plan Discussed with: CRNA  Anesthesia Plan Comments:         Anesthesia Quick Evaluation

## 2017-05-20 NOTE — Progress Notes (Signed)
VASCULAR LAB PRELIMINARY  PRELIMINARY  PRELIMINARY  PRELIMINARY  Left upper extremity venous duplex completed.    Preliminary report:  Left :  No evidence of DVT or superficial thrombosis.    Margurette Brener, RVS 05/20/2017, 1:37 PM

## 2017-05-20 NOTE — Progress Notes (Addendum)
The patient was taken to the operating room for exchange of his left ureteral stent. There were no stones along the expected trajectory of the ureter, thus seen on the CAT scan are extraluminal. There was some mild hydro-fro cysts. Hopefully, after exchanging the patient's stents his kidney will drain more effectively.  The remaining aspect of the patient's care should be focused on physical therapy, nutrition, and gaining strength so that he can return to the nursing home safely.  I will continue to follow along with this patient.

## 2017-05-20 NOTE — Op Note (Signed)
Preoperative diagnosis:  1. Left obstructed stent   Postoperative diagnosis:  1. Same 2. Left hydronephrosis   Procedure: 1. Left retrograde pyelogram with interpretation 2. Left ureteral stent exchange  Surgeon: [name], MD  Anesthesia: General  Complications: None  Intraoperative findings:  A left retrograde pyelogram was performed through a dual-lumen catheter. The systems treated a caliber ureter up to the renal pelvis where there was some extra of contrast in a contained small pocket consistent with the patient's previously known perforation. Where the patient's stones were noted to be on the CT scan, around L3, there were no filling defects suggesting that the stone seen on scan were extra luminal. There was mild hydronephrosis within the kidney itself.  The stent was positioned in the patient's upper pole, stent tether was left behind.  EBL: Minimal  Specimens: None  Indication: Gregory [name]. is a 56 y.o. patient with reason history of PCNL on the left, at which time intraoperatively he was noted to have a small renal pelvic perforation and stones extraluminal. He did have some perinephric hemorrhage associated with the operation as well as some extravasation of saline which was clearly delineated on his CAT scan. He had been discharged to his cold nursing facility but then returned after falling several times and noted to be anemic with some bilateral pleural effusions. He also was noted to have some hydroureteronephrosis with concern of an obstructing stent. As such, we did opt to proceed with stent exchange and retrograde pyelogram..  After reviewing the management options for treatment, he elected to proceed with the above surgical procedure(s). We have discussed the potential benefits and risks of the procedure, side effects of the proposed treatment, the likelihood of the patient achieving the goals of the procedure, and any potential problems that might occur  during the procedure or recuperation. Informed consent has been obtained.  Description of procedure:  The patient was taken to the operating room and general anesthesia was induced.  The patient was placed in the dorsal lithotomy position, prepped and draped in the usual sterile fashion, and preoperative antibiotics were administered. A preoperative time-out was performed.   21 French 30 cystoscope was gently passed through the patient's urethra and into the bladder under visual guidance. A 360 cystoscopic evaluation noted no stones within the ladder or any bladder pathology. Stent was emanating from the patient's left ureteral orifice was was grabbed with a stent grasper and brought to the urethral meatus. A 0.038 sensor wire was then advanced through the stent and into the left renal pelvis. The stent was removed over the wire. I then advanced the dual lumen catheter over the wire into the distal ureter and performed a retrograde pyelogram using 10 mL of Omnipaque contrast with the above findings. Upon evaluation of the retrograde, I did not appreciate any stones within the collecting system. There were some filling defects in the extraluminal contained perforation, but nothing along the lumen of the ureter. As such, I opted to place a stent and not proceed with ureteroscopy.  I then backloaded the sensor wire into the cystoscope and reintroduced the cystoscope into the patient's bladder. I then advanced the 28 cm times 6 French double-J ureteral stent over the wire using the Seldinger technique. The position of the stent was then confirmed using fluoroscopic guidance. The stent was noted to be in the upper pole. Once in the upper pole, slowly backed out the wire and advanced the stent up to the bladder neck. Once  the wire was completely removed and nice curl was noted at the ureteral meatus.  The bladder was subsequently emptied. The patient was then awoken and returned to the PACU in stable  condition.  [name], M.D.

## 2017-05-21 LAB — BASIC METABOLIC PANEL
Anion gap: 8 (ref 5–15)
BUN: 16 mg/dL (ref 6–20)
CHLORIDE: 105 mmol/L (ref 101–111)
CO2: 23 mmol/L (ref 22–32)
CREATININE: 0.74 mg/dL (ref 0.61–1.24)
Calcium: 8.3 mg/dL — ABNORMAL LOW (ref 8.9–10.3)
Glucose, Bld: 124 mg/dL — ABNORMAL HIGH (ref 65–99)
Potassium: 4.2 mmol/L (ref 3.5–5.1)
Sodium: 136 mmol/L (ref 135–145)

## 2017-05-21 LAB — CBC
HCT: 25.2 % — ABNORMAL LOW (ref 39.0–52.0)
Hemoglobin: 8.4 g/dL — ABNORMAL LOW (ref 13.0–17.0)
MCH: 28.6 pg (ref 26.0–34.0)
MCHC: 33.3 g/dL (ref 30.0–36.0)
MCV: 85.7 fL (ref 78.0–100.0)
PLATELETS: 369 10*3/uL (ref 150–400)
RBC: 2.94 MIL/uL — AB (ref 4.22–5.81)
RDW: 18.5 % — ABNORMAL HIGH (ref 11.5–15.5)
WBC: 14.7 10*3/uL — AB (ref 4.0–10.5)

## 2017-05-21 MED ORDER — KETOROLAC TROMETHAMINE 15 MG/ML IJ SOLN
15.0000 mg | Freq: Four times a day (QID) | INTRAMUSCULAR | Status: DC | PRN
  Administered 2017-05-21: 15 mg via INTRAVENOUS
  Filled 2017-05-21: qty 1

## 2017-05-21 MED ORDER — TRAMADOL HCL 50 MG PO TABS
50.0000 mg | ORAL_TABLET | Freq: Four times a day (QID) | ORAL | Status: DC | PRN
  Administered 2017-05-21 – 2017-05-22 (×2): 50 mg via ORAL
  Administered 2017-05-22: 100 mg via ORAL
  Filled 2017-05-21 (×2): qty 2
  Filled 2017-05-21: qty 1

## 2017-05-21 MED ORDER — ACETAMINOPHEN 10 MG/ML IV SOLN
1000.0000 mg | Freq: Four times a day (QID) | INTRAVENOUS | Status: AC
  Administered 2017-05-21 – 2017-05-22 (×4): 1000 mg via INTRAVENOUS
  Filled 2017-05-21 (×4): qty 100

## 2017-05-21 MED ORDER — LEVOFLOXACIN 750 MG PO TABS
750.0000 mg | ORAL_TABLET | Freq: Every day | ORAL | Status: DC
  Administered 2017-05-21 – 2017-05-22 (×2): 750 mg via ORAL
  Filled 2017-05-21 (×2): qty 1

## 2017-05-21 NOTE — Telephone Encounter (Signed)
Looks like he is admitted but WBC significantly improved. I think the WBC elevation more likely reflected his infection / renal issues than Crohns. We can coordinate follow up once he is discharged, would you mind checking on him again next week? Thanks

## 2017-05-21 NOTE — Progress Notes (Signed)
I agree with the previous nurse's assessment

## 2017-05-21 NOTE — Progress Notes (Signed)
Urology Inpatient Progress Report  HCAP (healthcare-associated pneumonia) [J18.9] Symptomatic anemia [D64.9]  Procedure(s): CYSTOSCOPY WITH STENT PLACEMENT  1 Day Post-Op   Intv/Subj: No acute events overnight. Still having pain in his flank, but his pain is improving Has new cough Hasn't worked with PT Appetite is good  Principal Problem:   Anemia Active Problems:   Nephrolithiasis   Crohn's disease of small and large intestines with complication (HCC)   Depression   Protein-calorie malnutrition, severe  Current Facility-Administered Medications  Medication Dose Route Frequency Provider Last Rate Last Dose  . acetaminophen (TYLENOL) tablet 650 mg  650 mg Oral Q4H PRN Schorr, Rhetta Mura, NP   650 mg at 05/21/17 1400  . famotidine (PEPCID) tablet 20 mg  20 mg Oral QHS Emokpae, Ejiroghene E, MD   20 mg at 05/20/17 2236  . feeding supplement (PRO-STAT SUGAR FREE 64) liquid 30 mL  30 mL Oral TID Manuella Ghazi, Pratik D, DO   30 mL at 05/21/17 1551  . folic acid (FOLVITE) tablet 1 mg  1 mg Oral Daily Emokpae, Ejiroghene E, MD   1 mg at 05/21/17 0958  . iopamidol (ISOVUE-300) 61 % injection 30 mL  30 mL Oral Once PRN Emokpae, Ejiroghene E, MD      . ketorolac (TORADOL) 15 MG/ML injection 15 mg  15 mg Intravenous Q6H PRN Manuella Ghazi, Pratik D, DO      . levofloxacin (LEVAQUIN) tablet 750 mg  750 mg Oral Daily Manuella Ghazi, Pratik D, DO   750 mg at 05/21/17 1551  . multivitamin with minerals tablet 1 tablet  1 tablet Oral Daily Manuella Ghazi, Pratik D, DO   1 tablet at 05/21/17 [phone]  . ondansetron (ZOFRAN) injection 4 mg  4 mg Intravenous Q6H Emokpae, Ejiroghene E, MD   4 mg at 05/21/17 0634  . potassium chloride SA (K-DUR,KLOR-CON) CR tablet 20 mEq  20 mEq Oral Daily Emokpae, Ejiroghene E, MD   20 mEq at 05/21/17 0958  . sertraline (ZOLOFT) tablet 50 mg  50 mg Oral Daily Emokpae, Ejiroghene E, MD   50 mg at 05/21/17 0958  . tamsulosin (FLOMAX) capsule 0.4 mg  0.4 mg Oral Daily Emokpae, Ejiroghene E, MD   0.4 mg at  05/21/17 0958     Objective: Vital: Vitals:   05/20/17 1354 05/20/17 2313 05/21/17 0644 05/21/17 1402  BP: 105/68 99/70 104/77 98/75  Pulse: 84 75 65   Resp:  18 16   Temp: 97.6 F (36.4 C) 98 F (36.7 C) 97.8 F (36.6 C)   TempSrc:  Oral Oral   SpO2: 97% 100% 96%   Weight:      Height:       I/Os: I/O last 3 completed shifts: In: 6 [P.O.:850; I.V.:800; IV Piggyback:250] Out: 3810 [Urine:2360; Stool:1450]  Physical Exam:  General: Patient is in no apparent distress Lungs: Normal respiratory effort, chest expands symmetrically. GI: Incisions are c/d/i.  The abdomen is soft and nontender without mass. Ext: lower extremities symmetric  Lab Results:  Recent Labs  05/19/17 0430 05/20/17 0409 05/21/17 0410  WBC 16.2* 13.6* 14.7*  HGB 7.8* 9.1* 8.4*  HCT 22.8* 27.7* 25.2*    Recent Labs  05/19/17 0430 05/20/17 0409 05/21/17 0410  NA 135 140 136  K 3.3* 3.6 4.2  CL 107 107 105  CO2 22 24 23   GLUCOSE 121* 92 124*  BUN 9 11 16   CREATININE 0.94 0.84 0.74  CALCIUM 8.0* 8.6* 8.3*   No results for input(s): LABPT, INR in the last  72 hours. No results for input(s): LABURIN in the last 72 hours. Results for orders placed or performed during the hospital encounter of 05/18/17  Blood culture (routine x 2)     Status: None (Preliminary result)   Collection Time: 05/18/17 12:47 AM  Result Value Ref Range Status   Specimen Description BLOOD LEFT HAND  Final   Special Requests   Final    BOTTLES DRAWN AEROBIC AND ANAEROBIC Blood Culture adequate volume   Culture   Final    NO GROWTH 2 DAYS Performed at [facility], [place] [address], [place], [place] [address]    Report Status PENDING  Incomplete  Blood culture (routine x 2)     Status: None (Preliminary result)   Collection Time: 05/18/17  5:55 PM  Result Value Ref Range Status   Specimen Description BLOOD LEFT ARM  Final   Special Requests   Final    BOTTLES DRAWN AEROBIC AND ANAEROBIC Blood Culture  adequate volume   Culture   Final    NO GROWTH 3 DAYS Performed at [facility], [address], [place], [place] [address]    Report Status PENDING  Incomplete  Culture, Urine     Status: None   Collection Time: 05/18/17  7:00 PM  Result Value Ref Range Status   Specimen Description URINE, RANDOM  Final   Special Requests NONE  Final   Culture   Final    NO GROWTH Performed at [facility], [place] [address], [place], [place] [address]    Report Status 05/20/2017 FINAL  Final  MRSA PCR Screening     Status: None   Collection Time: 05/18/17 10:39 PM  Result Value Ref Range Status   MRSA by PCR NEGATIVE NEGATIVE Final    Comment:        The GeneXpert MRSA Assay (FDA approved for NASAL specimens only), is one component of a comprehensive MRSA colonization surveillance program. It is not intended to diagnose MRSA infection nor to guide or monitor treatment for MRSA infections.     Studies/Results: Dg C-arm 1-60 Min-no Report  Result Date: 05/20/2017 Fluoroscopy was utilized by the requesting physician.  No radiographic interpretation.    Assessment: Procedure(s): CYSTOSCOPY WITH STENT PLACEMENT, 1 Day Post-Op  doing well.  Plan: At this point, the patient needs aggressive physical therapy which is likely going to require skilled nursing facility. Agree with transitioning him to oral antibiotics. I don't he gets urinary tract infection, but he may have pneumonia. I will leave this to the hospitalist to decide duration of antibiotic. I have increased the patient's pain regimen slightly and repeated labs tomorrow morning.  Ascending that his labs are stable, I would expect that he be ready for discharge to the [facility] nursing [facility] tomorrow.  The patient will follow-up with me as scheduled for stent removal..  Please page with any additional questions or concerns, the urologist on call over the weekend is Dr. Jeffie Pollock.   Louis Meckel, MD Urology 05/21/2017,  5:37 PM

## 2017-05-21 NOTE — Telephone Encounter (Signed)
Called patient, no answer, left voice message for patient to call back.

## 2017-05-21 NOTE — Progress Notes (Signed)
PROGRESS NOTE    Gregory [name].  WUJ:[id number] DOB: 10-08-1960 DOA: 05/18/2017 PCP: Billie Ruddy, MD    Brief Narrative:  This is a 56 year old Caucasian male with history significant for Crohn's disease who was recently discharged to inpatient rehabilitation on 9/17 after a left percutaneous nephrolithotomy along with stent placement for treatment of a large left kidney stone. He was brought to the ED the following day for further evaluation after the patient fell 3 times at rehabilitation. He was noted to have acute symptomatic anemia with a hemoglobin of 6.7 for which he has received 1 unit of PRBC with improvement. He has had dark urine output as well as dark stools and he has also complained of some mild left upper quadrant and epigastric abdominal pain. He is also noted to have possible HCAP And has been started on treatment with IV vancomycin and cefepime. CT imaging of his abdomen and pelvis appears to demonstrate a poorly functioning stent on the left side with some developing hydronephrosis to the left kidney. The patient underwent cystoscopy with stent exchange with no noted complications on 9/56.   This morning he denies any symptomatic complaints or concerns aside from some persistent hematuria. He has still not been seen by physical therapy and continues to remain lightheaded while ambulating.  Assessment & Plan:   Principal Problem:   Anemia Active Problems:   Nephrolithiasis   Crohn's disease of small and large intestines with complication (HCC)   Depression   Protein-calorie malnutrition, severe   Acute symptomatic Anemia-improved with transfusion; stable- with dizziness and shortness of breath, hypotension on admission 94/58. Hemoglobin 6.7 from 7.9-  9/16. Hgb over the past month- 8-9. S/p recent surgery.  Etiology- likely surgical or Post surgical blood loss. Renal stone study- 05/15/17- perinephric hematoma. Patient has ongoing mild hematuria. - FOBT  negative -Transfuse for Hgb<7 -Orthostatic vitals currently pending -Am labs  Possible bilateral HCAP-improving-  No fever, WBC- Improving- now 16. Immunocompromised pt. 2 view Chest x-ray- By basilar pulmonary opacity- atelectasis or infection. Patient reports uncontrolled abd and flank pain- likely cause of atelectasis on imaging, also seen on CT abd 9/15. - No growth on cultures negative thus far, will switch to oral Levaquin 750 mg 3 days - incentive spirometry  Chest pain resolved with minimal troponin elevation -trend with no elevations noted  Nephrolithiasis with L hydronephrosis- s/p Stent, nephrolithotomy 05/14/17. Renal stone study 9/15- Status post placement of a left ureteric stent. Stone fragments, Small volume left perinephric hematoma. -Appreciate further urology recommendations -No growth on urine cultures -Switch from IV cefepime to oral Levaquin 750 mg 3 days  Crohn's disease - Currently asymptomatic; outpatient Entyvio treatment  DVT prophylaxis: SCDs Code Status: Full  Family Communication: Spouse at bedside  Disposition Plan: Back to inpt Rehab in 1-2 days if stable  Consults called: None Admission status: Inpt, Tele   Consultants:   Urology- Dr. Diona Fanti   Procedures: Anticipated cystoscopy and stent exchange today   Antimicrobials: IV Cefepime day DC 9/21; IV Vancomycin DC 9/19; Oral Levaquin to start 9/21   Objective: Vitals:   05/20/17 1215 05/20/17 1354 05/20/17 2313 05/21/17 0644  BP: 100/70 105/68 99/70 104/77  Pulse: 84 84 75 65  Resp: 13  18 16   Temp: (!) 97.5 F (36.4 C) 97.6 F (36.4 C) 98 F (36.7 C) 97.8 F (36.6 C)  TempSrc:   Oral Oral  SpO2: 97% 97% 100% 96%  Weight:      Height:  Intake/Output Summary (Last 24 hours) at 05/21/17 1320 Last data filed at 05/21/17 1000  Gross per 24 hour  Intake              810 ml  Output             1110 ml  Net             -300 ml   Filed Weights   05/18/17 1524  05/18/17 2133  Weight: 49.9 kg (110 lb) 54.4 kg (119 lb 14.9 oz)    Examination:  General exam: Appears calm and comfortable  Respiratory system: Clear to auscultation. Respiratory effort normal. Cardiovascular system: S1 & S2 heard, RRR. No JVD, murmurs, rubs, gallops or clicks. No pedal edema. Gastrointestinal system: Abdomen is nondistended, soft and nontender. Colostomy bag c/d/i. No organomegaly or masses felt. Normal bowel sounds heard. Central nervous system: Alert and oriented. No focal neurological deficits. Extremities: Symmetric 5 x 5 power. Skin: No rashes, lesions or ulcers Psychiatry: Judgement and insight appear normal. Mood & affect appropriate.     Data Reviewed: I have personally reviewed following labs and imaging studies  CBC:  Recent Labs Lab 05/16/17 0726 05/18/17 1628 05/19/17 0056 05/19/17 0430 05/20/17 0409 05/21/17 0410  WBC 31.4* 16.2*  --  16.2* 13.6* 14.7*  NEUTROABS  --  13.7*  --   --   --   --   HGB 7.9* 6.7* 7.3* 7.8* 9.1* 8.4*  HCT 23.4* 20.1* 21.3* 22.8* 27.7* 25.2*  MCV 87.6 86.6  --  85.4 85.2 85.7  PLT 324 318  --  308 344 938   Basic Metabolic Panel:  Recent Labs Lab 05/16/17 0726 05/18/17 1628 05/19/17 0430 05/20/17 0409 05/21/17 0410  NA 136 136 135 140 136  K 4.8 3.5 3.3* 3.6 4.2  CL 112* 106 107 107 105  CO2 18* 22 22 24 23   GLUCOSE 97 112* 121* 92 124*  BUN 14 16 9 11 16   CREATININE 1.63* 1.26* 0.94 0.84 0.74  CALCIUM 7.9* 8.2* 8.0* 8.6* 8.3*   GFR: Estimated Creatinine Clearance: 79.3 mL/min (by C-G formula based on SCr of 0.74 mg/dL). Liver Function Tests:  Recent Labs Lab 05/18/17 1628  AST 40  ALT 27  ALKPHOS 121  BILITOT 0.7  PROT 4.9*  ALBUMIN 1.7*   No results for input(s): LIPASE, AMYLASE in the last 168 hours. No results for input(s): AMMONIA in the last 168 hours. Coagulation Profile:  Recent Labs Lab 05/18/17 1628  INR 1.18   Cardiac Enzymes:  Recent Labs Lab 05/19/17 0856  05/19/17 1440 05/19/17 2033  TROPONINI 0.03* <0.03 <0.03   BNP (last 3 results) No results for input(s): PROBNP in the last 8760 hours. HbA1C: No results for input(s): HGBA1C in the last 72 hours. CBG: No results for input(s): GLUCAP in the last 168 hours. Lipid Profile: No results for input(s): CHOL, HDL, LDLCALC, TRIG, CHOLHDL, LDLDIRECT in the last 72 hours. Thyroid Function Tests: No results for input(s): TSH, T4TOTAL, FREET4, T3FREE, THYROIDAB in the last 72 hours. Anemia Panel: No results for input(s): VITAMINB12, FOLATE, FERRITIN, TIBC, IRON, RETICCTPCT in the last 72 hours. Sepsis Labs:  Recent Labs Lab 05/18/17 1805  LATICACIDVEN 0.56    Recent Results (from the past 240 hour(s))  Blood culture (routine x 2)     Status: None (Preliminary result)   Collection Time: 05/18/17 12:47 AM  Result Value Ref Range Status   Specimen Description BLOOD LEFT HAND  Final   Special Requests  Final    BOTTLES DRAWN AEROBIC AND ANAEROBIC Blood Culture adequate volume   Culture   Final    NO GROWTH 1 DAY Performed at [facility], [place] [address] [place], [place] [address]    Report Status PENDING  Incomplete  Blood culture (routine x 2)     Status: None (Preliminary result)   Collection Time: 05/18/17  5:55 PM  Result Value Ref Range Status   Specimen Description BLOOD LEFT ARM  Final   Special Requests   Final    BOTTLES DRAWN AEROBIC AND ANAEROBIC Blood Culture adequate volume   Culture   Final    NO GROWTH 2 DAYS Performed at [facility], [place] [address], [place], [place] [address]    Report Status PENDING  Incomplete  Culture, Urine     Status: None   Collection Time: 05/18/17  7:00 PM  Result Value Ref Range Status   Specimen Description URINE, RANDOM  Final   Special Requests NONE  Final   Culture   Final    NO GROWTH Performed at [facility], [place] [address], [place], [place] [address]    Report Status 05/20/2017 FINAL  Final  MRSA PCR  Screening     Status: None   Collection Time: 05/18/17 10:39 PM  Result Value Ref Range Status   MRSA by PCR NEGATIVE NEGATIVE Final    Comment:        The GeneXpert MRSA Assay (FDA approved for NASAL specimens only), is one component of a comprehensive MRSA colonization surveillance program. It is not intended to diagnose MRSA infection nor to guide or monitor treatment for MRSA infections.          Radiology Studies: Dg C-arm 1-60 Min-no Report  Result Date: 05/20/2017 Fluoroscopy was utilized by the requesting physician.  No radiographic interpretation.        Scheduled Meds: . famotidine  20 mg Oral QHS  . feeding supplement (PRO-STAT SUGAR FREE 64)  30 mL Oral TID  . folic acid  1 mg Oral Daily  . multivitamin with minerals  1 tablet Oral Daily  . ondansetron (ZOFRAN) IV  4 mg Intravenous Q6H  . potassium chloride SA  20 mEq Oral Daily  . sertraline  50 mg Oral Daily  . tamsulosin  0.4 mg Oral Daily   Continuous Infusions: . ceFEPime (MAXIPIME) IV Stopped (05/21/17 0704)     LOS: 3 days    Time spent: 30 minutes    Pratik Darleen Crocker, MD [facility] Pager [phone]  If 7PM-7AM, please contact night-coverage [email or web address] Password [email or web address] 05/21/2017, 1:20 PM

## 2017-05-21 NOTE — Progress Notes (Signed)
Pharmacy Antibiotic Note  Gregory [name]. is a 56 y.o. male was hospitalized 9/14-9/17 for kidney stones and is s/p percutaneous nephrostomy tube placement 04/12/17.  She presented back to the ED from SNF on  05/18/2017 s/p fall. Vancomycin and cefepime started on admission for suspected PNA.  Today, 05/21/2017: Day #3 antibiotic - AKI resolved - WBC elevated, stable - vancomycin d/ced d/t neg MRSA PCR - Cultures unrevealing   Plan:  -cefepime 1 gm IV q8h - pharmacy will sign off - Consider change to PO antibiotic (? cefpodoxime 254m BID x 5 days or levofloxacin 7566mdaily x 3 days)  _________________________________  Height: 5' 11"  (180.3 cm) Weight: 119 lb 14.9 oz (54.4 kg) IBW/kg (Calculated) : 75.3  Temp (24hrs), Avg:97.8 F (36.6 C), Min:97.5 F (36.4 C), Max:98.2 F (36.8 C)   Recent Labs Lab 05/16/17 0726 05/18/17 1628 05/18/17 1805 05/19/17 0430 05/20/17 0409 05/21/17 0410  WBC 31.4* 16.2*  --  16.2* 13.6* 14.7*  CREATININE 1.63* 1.26*  --  0.94 0.84 0.74  LATICACIDVEN  --   --  0.56  --   --   --     Estimated Creatinine Clearance: 79.3 mL/min (by C-G formula based on SCr of 0.74 mg/dL).    Allergies  Allergen Reactions  . Methotrexate Derivatives Other (See Comments)    Anemia, low WBC, severe GI symptoms,   . Humira [Adalimumab] Other (See Comments)    Intolerance  . Penicillins Other (See Comments)    Has patient had a PCN reaction causing immediate rash, facial/tongue/throat swelling, SOB or lightheadedness with hypotension: Unknown Has patient had a PCN reaction causing severe rash involving mucus membranes or skin necrosis: Unknown Has patient had a PCN reaction that required hospitalization: Unknown Has patient had a PCN reaction occurring within the last 10 years: No If all of the above answers are "NO", then may proceed with Cephalosporin use.   . Remicade [Infliximab] Other (See Comments)    Intolerance   Antimicrobials this  admission: 9/18 Vancomycin >> 9/19 9/18 Cefepime >>    Dose adjustments this admission:   Microbiology results: 9/18 BCx x2: NGTD 9/18 MRSA PCR (-) 9/18 ucx: NG 9/18 ur strep: neg 9/18 UA: rare bact, moderate leuk Thank you for allowing pharmacy to be a part of this patient's care.  DuDoreene ElandPharmD, BCPS.   Pager: [phone]/21/2018 10:50 AM

## 2017-05-21 NOTE — Care Management Important Message (Signed)
Important Message  Patient Details  Name: Gregory [name]. MRN: [id number] Date of Birth: [birth year]   Medicare Important Message Given:  Yes    Kerin Salen 05/21/2017, 1:13 [place] Message  Patient Details  Name: Gregory [name]. MRN: [id number] Date of Birth: 07-Mar-1961   Medicare Important Message Given:  Yes    Kerin Salen 05/21/2017, 1:13 PM

## 2017-05-21 NOTE — Evaluation (Signed)
Physical Therapy Evaluation Patient Details Name: Gregory [name]. MRN: [id number] DOB: 1961-01-12 Today's Date: 05/21/2017   History of Present Illness  56 year old Caucasian male with history significant for Crohn's disease, s/p colectomy and ileostomy who was recently discharged to SNF on 9/17 after a left percutaneous nephrolithotomy along with stent placement for treatment of a large left kidney stone. He was brought to the ED the following day for further evaluation after the patient fell 3 times at rehabilitation. He was noted to have acute symptomatic anemia with a hemoglobin of 6.7 for which he has received 1 unit of PRBC with improvement.  Pt s/p cystoscopy with stent exchange with no noted complications on 8/30.   Clinical Impression  Pt admitted with above diagnosis. Pt currently with functional limitations due to the deficits listed below (see PT Problem List).  Pt will benefit from skilled PT to increase their independence and safety with mobility to allow discharge to the venue listed below.  Pt assisted with ambulating short distance which was limited by increased pain and dizziness.  BP upon return to sitting 98/75 mmHg and SPO2 94% on room air (RN notified).  Pt also requesting water (had warm water pitcher beside sink) so provided pitcher of ice and water as well as a fresh cup and encouraged pt's fluid intake.  Pt hopeful for d/c home however states he lives alone.  Would recommend d/c back to SNF, however if pt not agreeable, then HHPT and RW.     Follow Up Recommendations SNF;Supervision/Assistance - 24 hour    Equipment Recommendations  Rolling walker with 5" wheels    Recommendations for Other Services       Precautions / Restrictions Precautions Precautions: Fall Precaution Comments: very weak, ileostomy      Mobility  Bed Mobility Overal bed mobility: Needs Assistance Bed Mobility: Supine to Sit;Sit to Supine     Supine to sit: Min guard;HOB elevated Sit  to supine: Min guard;HOB elevated   General bed mobility comments: increased time, utilizes HOB elevated and rail to self assist  Transfers Overall transfer level: Needs assistance Equipment used: Rolling walker (2 wheeled) Transfers: Sit to/from Stand Sit to Stand: Min assist         General transfer comment: assist to rise and steady  Ambulation/Gait Ambulation/Gait assistance: Min assist Ambulation Distance (Feet): 40 Feet Assistive device: Rolling walker (2 wheeled) Gait Pattern/deviations: Step-through pattern;Decreased stride length;Narrow base of support     General Gait Details: verbal cues for RW positioning, pt reports increased pain and moderate dizziness which did not worsen or alleviate, assisted back to room for safety, BP upon return to sitting 98/75 mmHg (RN aware notified)  Stairs            Wheelchair Mobility    Modified Rankin (Stroke Patients Only)       Balance Overall balance assessment: Needs assistance         Standing balance support: Bilateral upper extremity supported Standing balance-Leahy Scale: Poor Standing balance comment: requires UE support                             Pertinent Vitals/Pain Pain Assessment: 0-10 Pain Score: 8  Pain Location: surgical site Pain Descriptors / Indicators: Aching;Sore;Discomfort Pain Intervention(s): Monitored during session;Repositioned;Limited activity within patient's tolerance;RN gave pain meds during session    Home Living Family/patient expects to be discharged to:: Private residence Living Arrangements: Alone Available Help at Discharge: Family;Available  PRN/intermittently Type of Home: Mobile home Home Access: Stairs to enter Entrance Stairs-Rails: Psychiatric nurse of Steps: 3-4 Home Layout: One level Home Equipment: Cane - single point      Prior Function Level of Independence: Independent with assistive device(s)         Comments: admitted  from SNF, typically mod I at home, reports he has a pug he hopes to get back to at home     Hand Dominance        Extremity/Trunk Assessment   Upper Extremity Assessment Upper Extremity Assessment: Generalized weakness    Lower Extremity Assessment Lower Extremity Assessment: Generalized weakness    Cervical / Trunk Assessment Cervical / Trunk Assessment: Other exceptions Cervical / Trunk Exceptions: forward head posture  Communication   Communication: No difficulties  Cognition Arousal/Alertness: Awake/alert Behavior During Therapy: WFL for tasks assessed/performed Overall Cognitive Status: Within Functional Limits for tasks assessed                                        General Comments      Exercises     Assessment/Plan    PT Assessment Patient needs continued PT services  PT Problem List Decreased strength;Decreased activity tolerance;Decreased balance;Decreased mobility;Pain;Decreased knowledge of use of DME       PT Treatment Interventions Gait training;Functional mobility training;Therapeutic activities;Balance training;Patient/family education;DME instruction;Therapeutic exercise    PT Goals (Current goals can be found in the Care Plan section)  Acute Rehab PT Goals PT Goal Formulation: With patient Time For Goal Achievement: 05/28/17 Potential to Achieve Goals: Good    Frequency Min 3X/week   Barriers to discharge        Co-evaluation               AM-PAC PT "6 Clicks" Daily Activity  Outcome Measure Difficulty turning over in bed (including adjusting bedclothes, sheets and blankets)?: A Little Difficulty moving from lying on back to sitting on the side of the bed? : Unable Difficulty sitting down on and standing up from a chair with arms (e.g., wheelchair, bedside commode, etc,.)?: Unable Help needed moving to and from a bed to chair (including a wheelchair)?: A Little Help needed walking in hospital room?: A Lot Help  needed climbing 3-5 steps with a railing? : Total 6 Click Score: 11    End of Session Equipment Utilized During Treatment: Gait belt Activity Tolerance: Patient limited by fatigue;Patient limited by pain Patient left: in bed;with bed alarm set;with call bell/phone within reach;with nursing/sitter in room Nurse Communication: Mobility status PT Visit Diagnosis: Unsteadiness on feet (R26.81);Muscle weakness (generalized) (M62.81)    Time: 5859-2924 PT Time Calculation (min) (ACUTE ONLY): 21 min   Charges:   PT Evaluation $PT Eval Moderate Complexity: 1 Mod     PT G Codes:        Carmelia Bake, PT, DPT 05/21/2017 Pager: [phone]  York Ram E 05/21/2017, 3:38 PM

## 2017-05-22 DIAGNOSIS — K50819 Crohn's disease of both small and large intestine with unspecified complications: Secondary | ICD-10-CM | POA: Diagnosis not present

## 2017-05-22 DIAGNOSIS — R278 Other lack of coordination: Secondary | ICD-10-CM | POA: Diagnosis not present

## 2017-05-22 DIAGNOSIS — M6281 Muscle weakness (generalized): Secondary | ICD-10-CM | POA: Diagnosis not present

## 2017-05-22 DIAGNOSIS — N400 Enlarged prostate without lower urinary tract symptoms: Secondary | ICD-10-CM | POA: Diagnosis not present

## 2017-05-22 DIAGNOSIS — N179 Acute kidney failure, unspecified: Secondary | ICD-10-CM | POA: Diagnosis not present

## 2017-05-22 DIAGNOSIS — E43 Unspecified severe protein-calorie malnutrition: Secondary | ICD-10-CM | POA: Diagnosis not present

## 2017-05-22 DIAGNOSIS — N99528 Other complication of other external stoma of urinary tract: Secondary | ICD-10-CM | POA: Diagnosis not present

## 2017-05-22 DIAGNOSIS — D649 Anemia, unspecified: Secondary | ICD-10-CM | POA: Diagnosis not present

## 2017-05-22 DIAGNOSIS — R2689 Other abnormalities of gait and mobility: Secondary | ICD-10-CM | POA: Diagnosis not present

## 2017-05-22 DIAGNOSIS — F329 Major depressive disorder, single episode, unspecified: Secondary | ICD-10-CM | POA: Diagnosis not present

## 2017-05-22 DIAGNOSIS — N202 Calculus of kidney with calculus of ureter: Secondary | ICD-10-CM | POA: Diagnosis not present

## 2017-05-22 DIAGNOSIS — I959 Hypotension, unspecified: Secondary | ICD-10-CM | POA: Diagnosis not present

## 2017-05-22 DIAGNOSIS — K219 Gastro-esophageal reflux disease without esophagitis: Secondary | ICD-10-CM | POA: Diagnosis not present

## 2017-05-22 DIAGNOSIS — F322 Major depressive disorder, single episode, severe without psychotic features: Secondary | ICD-10-CM | POA: Diagnosis not present

## 2017-05-22 DIAGNOSIS — N390 Urinary tract infection, site not specified: Secondary | ICD-10-CM | POA: Diagnosis not present

## 2017-05-22 DIAGNOSIS — K5090 Crohn's disease, unspecified, without complications: Secondary | ICD-10-CM | POA: Diagnosis not present

## 2017-05-22 DIAGNOSIS — N200 Calculus of kidney: Secondary | ICD-10-CM | POA: Diagnosis not present

## 2017-05-22 DIAGNOSIS — D62 Acute posthemorrhagic anemia: Secondary | ICD-10-CM | POA: Diagnosis not present

## 2017-05-22 DIAGNOSIS — N1339 Other hydronephrosis: Secondary | ICD-10-CM | POA: Diagnosis not present

## 2017-05-22 DIAGNOSIS — J189 Pneumonia, unspecified organism: Secondary | ICD-10-CM | POA: Diagnosis not present

## 2017-05-22 DIAGNOSIS — G8929 Other chronic pain: Secondary | ICD-10-CM | POA: Diagnosis not present

## 2017-05-22 LAB — BASIC METABOLIC PANEL
Anion gap: 7 (ref 5–15)
BUN: 19 mg/dL (ref 6–20)
CHLORIDE: 108 mmol/L (ref 101–111)
CO2: 24 mmol/L (ref 22–32)
CREATININE: 0.94 mg/dL (ref 0.61–1.24)
Calcium: 8.1 mg/dL — ABNORMAL LOW (ref 8.9–10.3)
GFR calc Af Amer: >60 mL/min (ref >60)
GFR calc non Af Amer: >60 mL/min (ref >60)
Glucose, Bld: 92 mg/dL (ref 65–99)
Potassium: 5.1 mmol/L (ref 3.5–5.1)
Sodium: 139 mmol/L (ref 135–145)

## 2017-05-22 LAB — CBC
HCT: 26.1 % — ABNORMAL LOW (ref 39.0–52.0)
HEMOGLOBIN: 8.5 g/dL — AB (ref 13.0–17.0)
MCH: 28.4 pg (ref 26.0–34.0)
MCHC: 32.6 g/dL (ref 30.0–36.0)
MCV: 87.3 fL (ref 78.0–100.0)
PLATELETS: 328 10*3/uL (ref 150–400)
RBC: 2.99 MIL/uL — ABNORMAL LOW (ref 4.22–5.81)
RDW: 18.7 % — ABNORMAL HIGH (ref 11.5–15.5)
WBC: 16.5 10*3/uL — ABNORMAL HIGH (ref 4.0–10.5)

## 2017-05-22 MED ORDER — LEVOFLOXACIN 750 MG PO TABS: 750.0000 mg | ORAL_TABLET | Freq: Every day | ORAL | 0 refills | Status: AC

## 2017-05-22 MED ORDER — TRAMADOL HCL 50 MG PO TABS: 50.0000 mg | ORAL_TABLET | Freq: Four times a day (QID) | ORAL | 0 refills | Status: DC | PRN

## 2017-05-22 MED ORDER — HYDROCODONE-ACETAMINOPHEN 5-325 MG PO TABS
1.0000 | ORAL_TABLET | Freq: Once | ORAL | Status: AC
  Administered 2017-05-22: 1 via ORAL
  Filled 2017-05-22: qty 1

## 2017-05-22 NOTE — Progress Notes (Signed)
Discharge report called to nurse Clarene Critchley at [facility]. Sister to transport patient via private vehicle to facility. AVS and Prescriptions given to sister to take with patient to give to facility. Condition stable. Eulas Post, RN

## 2017-05-22 NOTE — Discharge Summary (Signed)
Physician Discharge Summary  Gregory [name]. VCB:[id number] DOB: [birth year] DOA: 05/18/2017  PCP: Billie Ruddy, MD  Admit date: 05/18/2017 Discharge date: 05/22/2017  Admitted From: SNF Disposition:  SNF [facility]  Recommendations for Outpatient Follow-up:  1. Follow up with PCP at facility 2. Please obtain BMP/CBC in one week  Home Health:No Equipment/Devices:None  Discharge Condition:Stable CODE STATUS:Full Diet recommendation: Heart Healthy   Brief/Interim Summary: This is a 56 year old Caucasian male with history significant for Crohn's disease who was recently discharged to inpatient rehabilitation on 9/17 after a left percutaneous nephrolithotomy along with stent placement for treatment of a large left kidney stone. He was brought to the ED the following day for further evaluation after the patient fell 3 times at rehabilitation. He was noted to have acute symptomatic anemia with a hemoglobin of 6.7 for which he has received 1 unit of PRBC with improvement. He has had dark urine output as well as dark stools and he has also complained of some mild left upper quadrant and epigastric abdominal pain. He is also noted to have possible HCAP And has been started on treatment with IV vancomycin and cefepime. CT imaging of his abdomen and pelvis appears to demonstrate a poorly functioning stent on the left side with some developing hydronephrosis to the left kidney. The patient underwent cystoscopy with stent exchange with no noted complications on 3/84.   He continues to have some mild hematuria and ongoing weakness, but is essentially stable for discharge back to rehabilitation today. He has been transitioned to oral Levaquin on 9/21 and will require 2 more doses which have been prescribed for him to complete the course of treatment. His hemoglobin on discharge is 8.4. White cell count is approximately 16,000. He is afebrile with stable vital signs. He has been seen by urology with plans  to follow up for stent removal as scheduled.  Discharge Diagnoses:  Principal Problem:   Anemia Active Problems:   Nephrolithiasis   Crohn's disease of small and large intestines with complication (HCC)   Depression   Protein-calorie malnutrition, severe  Acute symptomatic Anemia-improved with transfusion; stable- with dizziness and shortness of breath, hypotension on admission 94/58. Patient has ongoing mild hematuria. - FOBT negative -Stable with Hgb 8.4 on DC  Possible bilateral HCAP-improving-  No fever, WBC- Improving- now 16.  -Continue oral Levaquin for 2 more days  Chest pain resolved with minimal troponin elevation -trend with no elevations noted  Nephrolithiasis with L hydronephrosis- status post stent exchange on 9/20 -Appreciate further urology recommendations -No growth on urine cultures  Crohn's disease - Currently asymptomatic; outpatient Entyvio treatment  Discharge Instructions  Discharge Instructions    Diet - low sodium heart healthy    Complete by:  As directed    Increase activity slowly    Complete by:  As directed      Allergies as of 05/22/2017      Reactions   Methotrexate Derivatives Other (See Comments)   Anemia, low WBC, severe GI symptoms,    Humira [adalimumab] Other (See Comments)   Intolerance   Penicillins Other (See Comments)   Has patient had a PCN reaction causing immediate rash, facial/tongue/throat swelling, SOB or lightheadedness with hypotension: Unknown Has patient had a PCN reaction causing severe rash involving mucus membranes or skin necrosis: Unknown Has patient had a PCN reaction that required hospitalization: Unknown Has patient had a PCN reaction occurring within the last 10 years: No If all of the above answers are "NO", then may  proceed with Cephalosporin use.   Remicade [infliximab] Other (See Comments)   Intolerance      Medication List    STOP taking these medications   ciprofloxacin 500 MG  tablet Commonly known as:  CIPRO   sodium chloride flush 0.9 % Soln Commonly known as:  NS     TAKE these medications   cholecalciferol 400 units Tabs tablet Commonly known as:  VITAMIN D Take 400 Units by mouth daily.   diphenoxylate-atropine 2.5-0.025 MG tablet Commonly known as:  LOMOTIL Take 1 tablet by mouth 4 (four) times daily.   famotidine 20 MG tablet Commonly known as:  PEPCID Take 1 tablet (20 mg total) by mouth at bedtime.   feeding supplement (PRO-STAT SUGAR FREE 64) Liqd Take 30 mLs by mouth 2 (two) times daily.   folic acid 1 MG tablet Commonly known as:  FOLVITE Take 1 tablet (1 mg total) by mouth daily.   levofloxacin 750 MG tablet Commonly known as:  LEVAQUIN Take 1 tablet (750 mg total) by mouth daily.   midodrine 10 MG tablet Commonly known as:  PROAMATINE Take 1 tablet (10 mg total) by mouth 2 (two) times daily with a meal.   multivitamin with minerals Tabs tablet Take 1 tablet by mouth daily.   ondansetron 4 MG disintegrating tablet Commonly known as:  ZOFRAN-ODT Take 1 tablet (4 mg total) by mouth every 8 (eight) hours as needed for nausea or vomiting.   oxyCODONE 5 MG immediate release tablet Commonly known as:  Oxy IR/ROXICODONE Take 1-2 tablets (5-10 mg total) by mouth every 4 (four) hours as needed for severe pain.   potassium chloride SA 20 MEQ tablet Commonly known as:  K-DUR,KLOR-CON Take 1 tablet (20 mEq total) by mouth as directed. What changed:  when to take this   ranitidine 150 MG tablet Commonly known as:  ZANTAC Take 1 tablet (150 mg total) by mouth 2 (two) times daily.   sertraline 50 MG tablet Commonly known as:  ZOLOFT Take 1 tablet (50 mg total) by mouth daily.   tamsulosin 0.4 MG Caps capsule Commonly known as:  FLOMAX Take 0.4 mg by mouth daily.   traMADol 50 MG tablet Commonly known as:  ULTRAM Take 1-2 tablets (50-100 mg total) by mouth every 6 (six) hours as needed for moderate pain.             Discharge Care Instructions        Start     Ordered   05/23/17 0000  levofloxacin (LEVAQUIN) 750 MG tablet  Daily     05/22/17 1026   05/22/17 0000  traMADol (ULTRAM) 50 MG tablet  Every 6 hours PRN     05/22/17 1026   05/22/17 0000  Increase activity slowly     05/22/17 1026   05/22/17 0000  Diet - low sodium heart healthy     05/22/17 1026      Allergies  Allergen Reactions  . Methotrexate Derivatives Other (See Comments)    Anemia, low WBC, severe GI symptoms,   . Humira [Adalimumab] Other (See Comments)    Intolerance  . Penicillins Other (See Comments)    Has patient had a PCN reaction causing immediate rash, facial/tongue/throat swelling, SOB or lightheadedness with hypotension: Unknown Has patient had a PCN reaction causing severe rash involving mucus membranes or skin necrosis: Unknown Has patient had a PCN reaction that required hospitalization: Unknown Has patient had a PCN reaction occurring within the last 10 years: No If all  of the above answers are "NO", then may proceed with Cephalosporin use.   . Remicade [Infliximab] Other (See Comments)    Intolerance    Consultations:  Urology Dr. Louis Meckel   Procedures/Studies: Ct Abdomen Pelvis Wo Contrast  Result Date: 05/19/2017 CLINICAL DATA:  Left flank pain. Decreased hemoglobin. Previous nephrolithotomy 05/14/2017. Left nephrostomy tube placement 04/12/2017. History of Crohn disease. EXAM: CT ABDOMEN AND PELVIS WITHOUT CONTRAST TECHNIQUE: Multidetector CT imaging of the abdomen and pelvis was performed following the standard protocol without IV contrast. COMPARISON:  05/15/2017 FINDINGS: Lower chest: Moderate bilateral pleural effusions with basilar atelectasis. Bullous emphysematous changes in the lung bases. Hepatobiliary: No focal liver abnormality is seen. Gallbladder is decompressed. No gallstones, gallbladder wall thickening, or biliary dilatation. Pancreas: Unremarkable. No pancreatic ductal dilatation or  surrounding inflammatory changes. Spleen: Normal in size without focal abnormality. Adrenals/Urinary Tract: No adrenal gland nodules. Left ureteral stent is present with proximal pigtail in the left renal pelvis and distal pigtail in the bladder. The left kidney is enlarged with respect to the right. There is moderate left hydronephrosis. This could indicate poor stent function or reflux. There is diffuse stranding around the left kidney and extending along the left pericolic gutter and ileus psoas region. Some of the material in the pararenal spaces of increased density consistent with hematoma. This is decreasing since the previous study suggesting interval evolution. There is focal extraluminal gas in the left pararenal fat, also decreasing since previous study. This may be postoperative. Small amount of gas within the left intrarenal collecting system and in the bladder is also likely the iatrogenic and is decreasing since previous study. Infection is not entirely excluded. Tiny intrarenal stones are demonstrated in the left. Tiny stone fragments in the left renal pelvis and in the left ureter along the catheter. Right kidney and ureter are unremarkable. No bladder wall thickening or bladder stones. Stomach/Bowel: Postoperative changes with a left lower quadrant colostomy and rectal stump. Stomach, small bowel, and colon are not abnormally distended. Wall thickening demonstrated in the ascending colon and in multiple small bowel loops mid abdominal small bowel anastomosis with dilated segment and wall thickening. Appearances are similar to previous study and are consistent with changes of Crohn's disease. Active enteritis/colitis not excluded. Vascular/Lymphatic: Normal caliber abdominal aorta. No calcification. Prominent retroperitoneal lymph nodes without pathologic enlargement are likely reactive. Reproductive: Prostate is unremarkable. Other: No free air or free fluid in the abdomen. Abdominal wall  musculature appears intact. Musculoskeletal: Degenerative changes in the spine. No destructive bone lesions. IMPRESSION: 1. Postoperative changes in and around the left kidney are improving since previous study. There is residual perinephric and ileus psoas hematoma, extraluminal gas in the left pararenal space, and left ureteral stent. 2. Multiple tiny stone fragments demonstrated in the left renal pelvis and along the left ureter. 3. Left hydronephrosis may indicate poor stent function. 4. Left lower quadrant colostomy. Multiple areas of small bowel and colonic wall thickening consistent with changes of Crohn's disease. Prominent retroperitoneal lymph nodes are likely reactive. 5. Bilateral pleural effusions with basilar atelectasis. Electronically Signed   By: Lucienne Capers M.D.   On: 05/19/2017 00:43   Dg Chest 2 View  Result Date: 05/18/2017 CLINICAL DATA:  Fall yesterday. Decreased hemoglobin. Shortness of breath. EXAM: CHEST  2 VIEW COMPARISON:  None. FINDINGS: Heart size is within normal limits. 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 No pneumothorax visualized. Small left pleural effusion versus pleural thickening. Bibasilar pulmonary opacity is also seen which may be due to atelectasis, infection,  or contusion. IMPRESSION: Bibasilar pulmonary opacity, which may be due to atelectasis, infection, or contusion . Small left hemothorax or other pleural effusion cannot be excluded. No pneumothorax visualized. Bullous emphysema. Electronically Signed   By: Earle Gell M.D.   On: 05/18/2017 16:59   Dg Abd 1 View  Result Date: 04/28/2017 CLINICAL DATA:  Increasing abdominal pain. Nephrostomy tube with possible blockage. EXAM: ABDOMEN - 1 VIEW COMPARISON:  None. FINDINGS: Nephrostomy tube tip is loosely coiled over the left flank. No tight kink is seen. Postoperative bowel seen in the pelvis and left abdomen with ostomy seen over the left lower quadrant. No overt signs of bowel obstruction. No concerning mass  effect or calcification. Mild atelectatic or scar-like opacity behind the heart. IMPRESSION: 1. Left nephrostomy tube with retention loop coiled over the left flank. No kinking or catheter fracture. 2. Postoperative bowel.  Nonobstructive bowel gas pattern. Electronically Signed   By: Monte Fantasia M.D.   On: 04/28/2017 13:28   Ct Head Wo Contrast  Result Date: 05/18/2017 CLINICAL DATA:  Unwitnessed fall at rehab facility, head and neck trauma EXAM: CT HEAD WITHOUT CONTRAST CT MAXILLOFACIAL WITHOUT CONTRAST CT CERVICAL SPINE WITHOUT CONTRAST TECHNIQUE: Multidetector CT imaging of the head, cervical spine, and maxillofacial structures were performed using the standard protocol without intravenous contrast. Multiplanar CT image reconstructions of the cervical spine and maxillofacial structures were also generated. COMPARISON:  None. FINDINGS: CT HEAD FINDINGS Brain: No evidence of acute infarction, hemorrhage, hydrocephalus, extra-axial collection or mass lesion/mass effect. Vascular: No hyperdense vessel or unexpected calcification. Skull: Normal. Negative for fracture or focal lesion. Other: None. CT MAXILLOFACIAL FINDINGS Osseous: No fracture or mandibular dislocation. No destructive process. Orbits: Negative. No traumatic or inflammatory finding. Sinuses: Clear. Soft tissues: Negative. CT CERVICAL SPINE FINDINGS Alignment: Normal. Skull base and vertebrae: No acute fracture. No primary bone lesion or focal pathologic process. Soft tissues and spinal canal: No prevertebral fluid or swelling. No visible canal hematoma. Disc levels: Minor cervical degenerative spondylosis at C5-6 and C6-7. Preserved vertebral body heights. No acute osseous finding or fracture. Facets are aligned. Upper chest: Apical bullous disease noted. Other: None. IMPRESSION: Normal head CT without contrast.  No acute intracranial abnormality. No acute facial bony trauma or fracture. No acute cervical spine fracture or malalignment by CT.  Minor cervical spondylosis. Electronically Signed   By: Jerilynn Mages.  Shick M.D.   On: 05/18/2017 17:34   Ct Cervical Spine Wo Contrast  Result Date: 05/18/2017 CLINICAL DATA:  Unwitnessed fall at rehab facility, head and neck trauma EXAM: CT HEAD WITHOUT CONTRAST CT MAXILLOFACIAL WITHOUT CONTRAST CT CERVICAL SPINE WITHOUT CONTRAST TECHNIQUE: Multidetector CT imaging of the head, cervical spine, and maxillofacial structures were performed using the standard protocol without intravenous contrast. Multiplanar CT image reconstructions of the cervical spine and maxillofacial structures were also generated. COMPARISON:  None. FINDINGS: CT HEAD FINDINGS Brain: No evidence of acute infarction, hemorrhage, hydrocephalus, extra-axial collection or mass lesion/mass effect. Vascular: No hyperdense vessel or unexpected calcification. Skull: Normal. Negative for fracture or focal lesion. Other: None. CT MAXILLOFACIAL FINDINGS Osseous: No fracture or mandibular dislocation. No destructive process. Orbits: Negative. No traumatic or inflammatory finding. Sinuses: Clear. Soft tissues: Negative. CT CERVICAL SPINE FINDINGS Alignment: Normal. Skull base and vertebrae: No acute fracture. No primary bone lesion or focal pathologic process. Soft tissues and spinal canal: No prevertebral fluid or swelling. No visible canal hematoma. Disc levels: Minor cervical degenerative spondylosis at C5-6 and C6-7. Preserved vertebral body heights. No acute osseous finding or  fracture. Facets are aligned. Upper chest: Apical bullous disease noted. Other: None. IMPRESSION: Normal head CT without contrast.  No acute intracranial abnormality. No acute facial bony trauma or fracture. No acute cervical spine fracture or malalignment by CT. Minor cervical spondylosis. Electronically Signed   By: Jerilynn Mages.  Shick M.D.   On: 05/18/2017 17:34   US Renal  Result Date: 04/28/2017 CLINICAL DATA:  Abdominal pain for 2 hours. Patient is post recent left nephrostomy catheter  exchange. EXAM: RENAL / URINARY TRACT ULTRASOUND COMPLETE COMPARISON:  Radiograph 04/28/2017 FINDINGS: Right Kidney: Length: 11.6 cm. Echogenicity within normal limits. No mass or hydronephrosis visualized. Left Kidney: Length: 11.9 cm. There is a severe hydronephrosis. 1.8 cm linear shadowing structure is seen in the lower pole of the left kidney. Bladder: Not seen. IMPRESSION: Normal appearance of the right kidney. Severe left hydronephrosis. 1.8 cm linear shadowing structure seen in the lower pole of the left kidney may represent a shadowing calculus or portion of the nephrostomy catheter. Electronically Signed   By: Fidela Salisbury M.D.   On: 04/28/2017 18:04   Dg C-arm 1-60 Min-no Report  Result Date: 05/20/2017 Fluoroscopy was utilized by the requesting physician.  No radiographic interpretation.   Dg C-arm Gt 120 Min-no Report  Result Date: 05/14/2017 Fluoroscopy was utilized by the requesting physician.  No radiographic interpretation.   Ct Renal Stone Study  Result Date: 05/15/2017 CLINICAL DATA:  Status post surgical left nephrostomy yesterday. Elevated white blood cell count. Evaluate for residual stone. History Crohn disease. EXAM: CT ABDOMEN AND PELVIS WITHOUT CONTRAST TECHNIQUE: Multidetector CT imaging of the abdomen and pelvis was performed following the standard protocol without IV contrast. COMPARISON:  11/08/2016 FINDINGS: Lower chest: Paraseptal emphysema. Left greater than right base airspace disease is worsened and new respectively. Normal heart size with development of right greater than left pleural effusions. Hepatobiliary: Normal liver. Cholecystectomy. Intrahepatic ducts similarly mildly dilated. Normal common duct caliber, including at 8 mm. Pancreas: Normal, without mass or ductal dilatation. Spleen: Normal in size, without focal abnormality. Adrenals/Urinary Tract: Normal adrenal glands. There is small volume air in the left renal collecting system, likely iatrogenic.  There is also a small perinephric hematoma adjacent the lower pole. Example 1.9 x 3.8 cm on image 37/series 2. Mild left caliectasis remains. Punctate renal collecting system calculi remain. The ureteric stent originates in the left external pelvis. Stone fragments measures 6 mm maximally adjacent the proximal course of the ureteric stent. Example image 42/series 2. The dominant ureteropelvic junction stone has been removed. Foley catheter within the urinary bladder. No bladder stone identified. The ureteric stent terminates in the bladder. Stomach/Bowel: Apparent proximal gastric wall thickening is favored to be due to underdistention. Hartmann's pouch. Left-sided ostomy. Again identified is dilatation of a loop of small bowel at the site of prior enterotomy. This may be due to atony. Inflamed small bowel in the left upper pelvis including on image 59/series 2. Vascular/Lymphatic: Normal caliber of the aorta and branch vessels. No abdominopelvic adenopathy. Reproductive: Normal prostate. Other: Development of small to moderate volume ascites. Small volume extraluminal gas within the left upper quadrant. Musculoskeletal: Degenerative disc disease at the lumbosacral junction. Prominent disc bulge. IMPRESSION: 1. Status post placement of a left ureteric stent. Stone fragments within the proximal left ureter with removal of dominant ureteropelvic junction stone. Improvement in mild caliectasis. 2. Small volume left perinephric hematoma. Left abdominal extraluminal gas. These may both be postoperative. Correlate with surgical history. 3. Development of ascites and pleural effusions, suggesting fluid  overload. Bibasilar airspace disease could represent atelectasis or infection/aspiration. 4. Inflamed left pelvic small bowel loop may represent active enteritis, given the clinical history of Crohn disease. Electronically Signed   By: Abigail Miyamoto M.D.   On: 05/15/2017 11:19   Ir Nephrostomy Placement Left  Result Date:  04/29/2017 CLINICAL DATA:  Obstructing renal calculus, status post percutaneous nephrostomy catheter placement with most recent exchange 04/12/2017. No output x4 days. Sepsis. EXAM: NEPHROSTOGRAM THROUGH EXISTING CATHETER (ATTEMPTED) LEFT PERCUTANEOUS NEPHROSTOMY CATHETER PLACEMENT UNDER ULTRASOUND AND FLUOROSCOPIC GUIDANCE FLUOROSCOPY TIME:  3.5 minutes, 73 uGym2 DAP seconds TECHNIQUE: The procedure, risks (including but not limited to bleeding, infection, organ damage ), benefits, and alternatives were explained to the patient. Questions regarding the procedure were encouraged and answered. The patient understands and consents to the procedure. Left previous placed catheter andFlank region prepped with Betadine, draped in usual sterile fashion, infiltrated locally with 1% lidocaine. Contrast was injected through the catheter. Extrarenal retroperitoneal positioning of the drain catheter was identified with no communication to the renal collecting system. 1.9 cm UPJ calculus noted. Intravenous Fentanyl and Versed were administered as conscious sedation during continuous monitoring of the patient's level of consciousness and physiological / cardiorespiratory status by the radiology RN, with a total moderate sedation time of 30 minutes. The previous catheter was cut and removed. Skin entry site infiltrated with 1% lidocaine. Under real-time ultrasound guidance, a 21-gauge trocar needle was advanced into a posterior lower pole calyx. Ultrasound image documentation was saved. Urine spontaneously returned through the needle. Needle was exchanged over a guidewire for transitional dilator. Contrast injection confirmed appropriate positioning. Catheter was exchanged over a guidewire for a 10 French pigtail catheter, formed centrally within the left renal collecting system. Contrast injection confirms appropriate positioning and patency. Catheter secured externally with 0 Prolene suture and placed to external drain bag.  COMPLICATIONS: COMPLICATIONS none IMPRESSION: 1. Malpositioned extrarenal drain catheter was cut and removed. 2. Technically successful left percutaneous nephrostomy catheter placement. Electronically Signed   By: Lucrezia Europe M.D.   On: 04/29/2017 16:54   Ct Maxillofacial Wo Contrast  Result Date: 05/18/2017 CLINICAL DATA:  Unwitnessed fall at rehab facility, head and neck trauma EXAM: CT HEAD WITHOUT CONTRAST CT MAXILLOFACIAL WITHOUT CONTRAST CT CERVICAL SPINE WITHOUT CONTRAST TECHNIQUE: Multidetector CT imaging of the head, cervical spine, and maxillofacial structures were performed using the standard protocol without intravenous contrast. Multiplanar CT image reconstructions of the cervical spine and maxillofacial structures were also generated. COMPARISON:  None. FINDINGS: CT HEAD FINDINGS Brain: No evidence of acute infarction, hemorrhage, hydrocephalus, extra-axial collection or mass lesion/mass effect. Vascular: No hyperdense vessel or unexpected calcification. Skull: Normal. Negative for fracture or focal lesion. Other: None. CT MAXILLOFACIAL FINDINGS Osseous: No fracture or mandibular dislocation. No destructive process. Orbits: Negative. No traumatic or inflammatory finding. Sinuses: Clear. Soft tissues: Negative. CT CERVICAL SPINE FINDINGS Alignment: Normal. Skull base and vertebrae: No acute fracture. No primary bone lesion or focal pathologic process. Soft tissues and spinal canal: No prevertebral fluid or swelling. No visible canal hematoma. Disc levels: Minor cervical degenerative spondylosis at C5-6 and C6-7. Preserved vertebral body heights. No acute osseous finding or fracture. Facets are aligned. Upper chest: Apical bullous disease noted. Other: None. IMPRESSION: Normal head CT without contrast.  No acute intracranial abnormality. No acute facial bony trauma or fracture. No acute cervical spine fracture or malalignment by CT. Minor cervical spondylosis. Electronically Signed   By: Jerilynn Mages.  Shick  M.D.   On: 05/18/2017 17:34     Discharge  Exam: Vitals:   05/21/17 2344 05/22/17 0744  BP: 104/74 102/63  Pulse: 65 66  Resp: 16 18  Temp: 97.8 F (36.6 C) 97.7 F (36.5 C)  SpO2: 97% 97%   Vitals:   05/21/17 0644 05/21/17 1402 05/21/17 2344 05/22/17 0744  BP: 104/77 98/75 104/74 102/63  Pulse: 65  65 66  Resp: 16  16 18   Temp: 97.8 F (36.6 C)  97.8 F (36.6 C) 97.7 F (36.5 C)  TempSrc: Oral  Oral Oral  SpO2: 96%  97% 97%  Weight:      Height:        General: Pt is alert, awake, not in acute distress Cardiovascular: RRR, S1/S2 +, no rubs, no gallops Respiratory: CTA bilaterally, no wheezing, no rhonchi Abdominal: Soft, NT, ND, bowel sounds + Extremities: no edema, no cyanosis   The results of significant diagnostics from this hospitalization (including imaging, microbiology, ancillary and laboratory) are listed below for reference.     Microbiology: Recent Results (from the past 240 hour(s))  Blood culture (routine x 2)     Status: None (Preliminary result)   Collection Time: 05/18/17 12:47 AM  Result Value Ref Range Status   Specimen Description BLOOD LEFT HAND  Final   Special Requests   Final    BOTTLES DRAWN AEROBIC AND ANAEROBIC Blood Culture adequate volume   Culture   Final    NO GROWTH 2 DAYS Performed at [facility], [address] [place], [place] [address]    Report Status PENDING  Incomplete  Blood culture (routine x 2)     Status: None (Preliminary result)   Collection Time: 05/18/17  5:55 PM  Result Value Ref Range Status   Specimen Description BLOOD LEFT ARM  Final   Special Requests   Final    BOTTLES DRAWN AEROBIC AND ANAEROBIC Blood Culture adequate volume   Culture   Final    NO GROWTH 3 DAYS Performed at [facility], [address], [place], [place] [address]    Report Status PENDING  Incomplete  Culture, Urine     Status: None   Collection Time: 05/18/17  7:00 PM  Result Value Ref Range Status   Specimen  Description URINE, RANDOM  Final   Special Requests NONE  Final   Culture   Final    NO GROWTH Performed at [facility], [place] [address] [place], [place] [address]    Report Status 05/20/2017 FINAL  Final  MRSA PCR Screening     Status: None   Collection Time: 05/18/17 10:39 PM  Result Value Ref Range Status   MRSA by PCR NEGATIVE NEGATIVE Final    Comment:        The GeneXpert MRSA Assay (FDA approved for NASAL specimens only), is one component of a comprehensive MRSA colonization surveillance program. It is not intended to diagnose MRSA infection nor to guide or monitor treatment for MRSA infections.      Labs: BNP (last 3 results) No results for input(s): BNP in the last 8760 hours. Basic Metabolic Panel:  Recent Labs Lab 05/18/17 1628 05/19/17 0430 05/20/17 0409 05/21/17 0410 05/22/17 0353  NA 136 135 140 136 139  K 3.5 3.3* 3.6 4.2 5.1  CL 106 107 107 105 108  CO2 22 22 24 23 24   GLUCOSE 112* 121* 92 124* 92  BUN 16 9 11 16 19   CREATININE 1.26* 0.94 0.84 0.74 0.94  CALCIUM 8.2* 8.0* 8.6* 8.3* 8.1*  Liver Function Tests:  Recent Labs Lab 05/18/17 1628  AST 40  ALT 27  ALKPHOS 121  BILITOT 0.7  PROT 4.9*  ALBUMIN 1.7*   No results for input(s): LIPASE, AMYLASE in the last 168 hours. No results for input(s): AMMONIA in the last 168 hours. CBC:  Recent Labs Lab 05/18/17 1628 05/19/17 0056 05/19/17 0430 05/20/17 0409 05/21/17 0410 05/22/17 0353  WBC 16.2*  --  16.2* 13.6* 14.7* 16.5*  NEUTROABS 13.7*  --   --   --   --   --   HGB 6.7* 7.3* 7.8* 9.1* 8.4* 8.5*  HCT 20.1* 21.3* 22.8* 27.7* 25.2* 26.1*  MCV 86.6  --  85.4 85.2 85.7 87.3  PLT 318  --  308 344 369 328   Cardiac Enzymes:  Recent Labs Lab 05/19/17 0856 05/19/17 1440 05/19/17 2033  TROPONINI 0.03* <0.03 <0.03   BNP: Invalid input(s): POCBNP CBG: No results for input(s): GLUCAP in the last 168 hours. D-Dimer No results for input(s): DDIMER in the last 72  hours. Hgb A1c No results for input(s): HGBA1C in the last 72 hours. Lipid Profile No results for input(s): CHOL, HDL, LDLCALC, TRIG, CHOLHDL, LDLDIRECT in the last 72 hours. Thyroid function studies No results for input(s): TSH, T4TOTAL, T3FREE, THYROIDAB in the last 72 hours.  Invalid input(s): FREET3 Anemia work up No results for input(s): VITAMINB12, FOLATE, FERRITIN, TIBC, IRON, RETICCTPCT in the last 72 hours. Urinalysis    Component Value Date/Time   COLORURINE YELLOW 05/19/2017 [place] 05/19/2017 1715   LABSPEC 1.010 05/19/2017 1715   PHURINE 6.0 05/19/2017 1715   GLUCOSEU NEGATIVE 05/19/2017 1715   HGBUR LARGE (A) 05/19/2017 [place] 05/19/2017 [place] 05/19/2017 1715   PROTEINUR NEGATIVE 05/19/2017 1715   NITRITE NEGATIVE 05/19/2017 1715   LEUKOCYTESUR MODERATE (A) 05/19/2017 1715   Sepsis Labs Invalid input(s): PROCALCITONIN,  WBC,  LACTICIDVEN Microbiology Recent Results (from the past 240 hour(s))  Blood culture (routine x 2)     Status: None (Preliminary result)   Collection Time: 05/18/17 12:47 AM  Result Value Ref Range Status   Specimen Description BLOOD LEFT HAND  Final   Special Requests   Final    BOTTLES DRAWN AEROBIC AND ANAEROBIC Blood Culture adequate volume   Culture   Final    NO GROWTH 2 DAYS Performed at [facility], [place] [address], [place], [place] [address]    Report Status PENDING  Incomplete  Blood culture (routine x 2)     Status: None (Preliminary result)   Collection Time: 05/18/17  5:55 PM  Result Value Ref Range Status   Specimen Description BLOOD LEFT ARM  Final   Special Requests   Final    BOTTLES DRAWN AEROBIC AND ANAEROBIC Blood Culture adequate volume   Culture   Final    NO GROWTH 3 DAYS Performed at [facility], [address] [place], [place] [address]    Report Status PENDING  Incomplete  Culture, Urine     Status: None   Collection Time: 05/18/17   7:00 PM  Result Value Ref Range Status   Specimen Description URINE, RANDOM  Final   Special Requests NONE  Final   Culture   Final    NO GROWTH Performed at [facility], [place] [address] [place], [place] [address]    Report Status 05/20/2017 FINAL  Final  MRSA PCR Screening     Status:  None   Collection Time: 05/18/17 10:39 PM  Result Value Ref Range Status   MRSA by PCR NEGATIVE NEGATIVE Final    Comment:        The GeneXpert MRSA Assay (FDA approved for NASAL specimens only), is one component of a comprehensive MRSA colonization surveillance program. It is not intended to diagnose MRSA infection nor to guide or monitor treatment for MRSA infections.      Time coordinating discharge: Over 30 minutes  SIGNED:   Rodena Goldmann, MD  [facility] 05/22/2017, 10:27 AM Pager [phone]  If 7PM-7AM, please contact night-coverage [email or web address] Password [email or web address]

## 2017-05-22 NOTE — Progress Notes (Signed)
Patient will discharge to [facility] Anticipated discharge date: 9/22 Family notified:  Hilda Blades (sister) Transportation by Hilda Blades- facility request patient arrive at approximately 1:30pm if possible  CSW signing off.  Jorge Ny MSW, LCSW [facility] #: [phone]

## 2017-05-22 NOTE — NC FL2 (Signed)
[place] LEVEL OF CARE SCREENING TOOL     IDENTIFICATION  Patient Name: Gregory [name]. Birthdate: 1960-11-19 Sex: male Admission Date (Current Location): 05/18/2017  [facility] and [place] Number:  Herbalist and Address:  [facility],  [place] [address] [place]      Provider Number: [id number]  Attending Physician Name and Address:  Rodena Goldmann, DO  Relative Name and Phone Number:       Current Level of Care: Hospital Recommended Level of Care: [place] Prior Approval Number:    Date Approved/Denied:   PASRR Number:    Discharge Plan: SNF    Current Diagnoses: Patient Active Problem List   Diagnosis Date Noted  . Anemia 05/18/2017  . Protein-calorie malnutrition, severe 04/30/2017  . Anuresis 04/28/2017  . Physical deconditioning 04/28/2017  . Protein calorie malnutrition ([place]) 04/28/2017  . Abdominal pain, left upper quadrant 04/28/2017  . GERD (gastroesophageal reflux disease) 04/28/2017  . 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 Chronic pain 04/28/2017  . Depression 04/28/2017  . Nausea & vomiting   . Crohn's disease of small and large intestines with complication ([place])   . Nephrolithiasis 04/10/2017    Orientation RESPIRATION BLADDER Height & Weight     Self, Time, Situation, Place  Normal Incontinent, External catheter Weight: 119 lb 14.9 oz (54.4 kg) Height:  5' 11"  (180.3 cm)  BEHAVIORAL SYMPTOMS/MOOD NEUROLOGICAL BOWEL NUTRITION STATUS      Ileostomy Diet (see DC summary)  AMBULATORY STATUS COMMUNICATION OF NEEDS Skin   Limited Assist Verbally Surgical wounds                       Personal Care Assistance Level of Assistance  Bathing, Dressing, Feeding Bathing Assistance: Limited assistance Feeding assistance: Independent Dressing Assistance: Limited assistance     Functional Limitations Info             SPECIAL CARE FACTORS FREQUENCY  PT (By licensed PT), OT  (By licensed OT)     PT Frequency: 5/wk OT Frequency: 5/wk            Contractures      Additional Factors Info  Code Status, Allergies, Psychotropic Code Status Info: FULL Allergies Info: Methotrexate Derivatives, Humira Adalimumab, Penicillins, Remicade Infliximab Psychotropic Info: zoloft         Current Medications (05/22/2017):  This is the current hospital active medication list Current Facility-Administered Medications  Medication Dose Route Frequency Provider Last Rate Last Dose  . acetaminophen (OFIRMEV) IV 1,000 mg  1,000 mg Intravenous Q6H [name], MD   Stopped at 05/22/17 0601  . famotidine (PEPCID) tablet 20 mg  20 mg Oral QHS Emokpae, Ejiroghene E, MD   20 mg at 05/21/17 2358  . feeding supplement (PRO-STAT SUGAR FREE 64) liquid 30 mL  30 mL Oral TID Manuella Ghazi, Pratik D, DO   30 mL at 05/22/17 0849  . folic acid (FOLVITE) tablet 1 mg  1 mg Oral Daily Emokpae, Ejiroghene E, MD   1 mg at 05/22/17 0849  . iopamidol (ISOVUE-300) 61 % injection 30 mL  30 mL Oral Once PRN Emokpae, Ejiroghene E, MD      . levofloxacin (LEVAQUIN) tablet 750 mg  750 mg Oral Daily Manuella Ghazi, Pratik D, DO   750 mg at 05/22/17 0848  . multivitamin with minerals tablet 1 tablet  1 tablet Oral Daily Manuella Ghazi, Pratik D, DO   1 tablet at 05/22/17 0848  .  ondansetron (ZOFRAN) injection 4 mg  4 mg Intravenous Q6H Emokpae, Ejiroghene E, MD   4 mg at 05/22/17 0657  . potassium chloride SA (K-DUR,KLOR-CON) CR tablet 20 mEq  20 mEq Oral Daily Emokpae, Ejiroghene E, MD   20 mEq at 05/22/17 0848  . sertraline (ZOLOFT) tablet 50 mg  50 mg Oral Daily Emokpae, Ejiroghene E, MD   50 mg at 05/21/17 0958  . tamsulosin (FLOMAX) capsule 0.4 mg  0.4 mg Oral Daily Emokpae, Ejiroghene E, MD   0.4 mg at 05/22/17 0848  . traMADol (ULTRAM) tablet 50-100 mg  50-100 mg Oral Q6H PRN [name], MD   50 mg at 05/22/17 7824     Discharge Medications: Please see discharge summary for a list of discharge  medications.  Relevant Imaging Results:  Relevant Lab Results:   Additional Information SS# [id number]  Jorge Ny, [place]

## 2017-05-23 LAB — CULTURE, BLOOD (ROUTINE X 2)
Culture: NO GROWTH
Special Requests: ADEQUATE

## 2017-05-24 DIAGNOSIS — D62 Acute posthemorrhagic anemia: Secondary | ICD-10-CM | POA: Diagnosis not present

## 2017-05-24 DIAGNOSIS — N1339 Other hydronephrosis: Secondary | ICD-10-CM | POA: Diagnosis not present

## 2017-05-24 DIAGNOSIS — J189 Pneumonia, unspecified organism: Secondary | ICD-10-CM | POA: Diagnosis not present

## 2017-05-24 DIAGNOSIS — I959 Hypotension, unspecified: Secondary | ICD-10-CM | POA: Diagnosis not present

## 2017-05-24 LAB — CULTURE, BLOOD (ROUTINE X 2)
Culture: NO GROWTH
Special Requests: ADEQUATE

## 2017-05-25 NOTE — Telephone Encounter (Signed)
Spoke to [place] at [facility] and patient is scheduled for follow up next week with APP and Dr. Havery Moros in November. Confirmed that they will let patient know about appointments.

## 2017-05-25 NOTE — Telephone Encounter (Signed)
Spoke to patient, he is currently in [facility] in [place], [phone]. He did state that he is going for his Entyvio infusion this week. He does not have a follow up visit here, I will contact the scheduler and arrange for visit. Is it okay for him to follow up with an APP?

## 2017-05-25 NOTE — Telephone Encounter (Signed)
Okay, he should proceed with Entyvio. He can follow up with an APP and would also book him for my next available opening in the next 1-2 months with me. thanks

## 2017-05-26 DIAGNOSIS — K5090 Crohn's disease, unspecified, without complications: Secondary | ICD-10-CM | POA: Diagnosis not present

## 2017-05-28 DIAGNOSIS — F322 Major depressive disorder, single episode, severe without psychotic features: Secondary | ICD-10-CM | POA: Diagnosis not present

## 2017-05-28 DIAGNOSIS — K5090 Crohn's disease, unspecified, without complications: Secondary | ICD-10-CM | POA: Diagnosis not present

## 2017-05-28 DIAGNOSIS — N179 Acute kidney failure, unspecified: Secondary | ICD-10-CM | POA: Diagnosis not present

## 2017-05-28 DIAGNOSIS — E43 Unspecified severe protein-calorie malnutrition: Secondary | ICD-10-CM | POA: Diagnosis not present

## 2017-05-28 DIAGNOSIS — J189 Pneumonia, unspecified organism: Secondary | ICD-10-CM | POA: Diagnosis not present

## 2017-05-28 DIAGNOSIS — D649 Anemia, unspecified: Secondary | ICD-10-CM | POA: Diagnosis not present

## 2017-05-31 ENCOUNTER — Other Ambulatory Visit: Payer: Self-pay | Admitting: Gastroenterology

## 2017-05-31 DIAGNOSIS — N202 Calculus of kidney with calculus of ureter: Secondary | ICD-10-CM | POA: Diagnosis not present

## 2017-06-01 ENCOUNTER — Telehealth: Payer: Self-pay | Admitting: Family Medicine

## 2017-06-01 NOTE — Telephone Encounter (Signed)
Please advise. Thank you

## 2017-06-01 NOTE — Telephone Encounter (Signed)
Gregory [name] would like to see if Dr. Volanda Napoleon would be willing to approve a PT and Skill nursing and the the PT to go out first.  Pt was D/C from [facility] today 06/01/17.

## 2017-06-02 ENCOUNTER — Ambulatory Visit: Payer: Medicare Other | Admitting: Physician Assistant

## 2017-06-02 NOTE — Telephone Encounter (Signed)
Gregory [name] went out today to do evaluation and pt does not need skill PT at this time.

## 2017-06-02 NOTE — Telephone Encounter (Signed)
FYI

## 2017-06-04 ENCOUNTER — Ambulatory Visit (INDEPENDENT_AMBULATORY_CARE_PROVIDER_SITE_OTHER): Payer: Medicare Other | Admitting: Family Medicine

## 2017-06-04 ENCOUNTER — Encounter: Payer: Self-pay | Admitting: Family Medicine

## 2017-06-04 VITALS — BP 90/72 | HR 92 | Temp 97.6°F | Wt 108.8 lb

## 2017-06-04 DIAGNOSIS — I9589 Other hypotension: Secondary | ICD-10-CM

## 2017-06-04 DIAGNOSIS — K50819 Crohn's disease of both small and large intestine with unspecified complications: Secondary | ICD-10-CM | POA: Diagnosis not present

## 2017-06-04 DIAGNOSIS — D649 Anemia, unspecified: Secondary | ICD-10-CM | POA: Diagnosis not present

## 2017-06-04 DIAGNOSIS — E43 Unspecified severe protein-calorie malnutrition: Secondary | ICD-10-CM | POA: Diagnosis not present

## 2017-06-04 LAB — BASIC METABOLIC PANEL
BUN: 8 mg/dL (ref 6–23)
CHLORIDE: 99 meq/L (ref 96–112)
CO2: 26 meq/L (ref 19–32)
CREATININE: 1.02 mg/dL (ref 0.40–1.50)
Calcium: 8.8 mg/dL (ref 8.4–10.5)
GFR: 80.26 mL/min (ref >60.00)
Glucose, Bld: 94 mg/dL (ref 70–99)
POTASSIUM: 3.7 meq/L (ref 3.5–5.1)
Sodium: 135 mEq/L (ref 135–145)

## 2017-06-04 LAB — CBC WITH DIFFERENTIAL/PLATELET
BASOS ABS: 0.1 10*3/uL (ref 0.0–0.1)
Basophils Relative: 0.6 % (ref 0.0–3.0)
Eosinophils Absolute: 0.3 10*3/uL (ref 0.0–0.7)
Eosinophils Relative: 1.8 % (ref 0.0–5.0)
HCT: 33.7 % — ABNORMAL LOW (ref 39.0–52.0)
Hemoglobin: 10.3 g/dL — ABNORMAL LOW (ref 13.0–17.0)
LYMPHS ABS: 2.2 10*3/uL (ref 0.7–4.0)
Lymphocytes Relative: 14.2 % (ref 12.0–46.0)
MCHC: 30.6 g/dL (ref 30.0–36.0)
MCV: 85.7 fl (ref 78.0–100.0)
MONO ABS: 1.4 10*3/uL — AB (ref 0.1–1.0)
Monocytes Relative: 8.9 % (ref 3.0–12.0)
Neutro Abs: 11.5 10*3/uL — ABNORMAL HIGH (ref 1.4–7.7)
Neutrophils Relative %: 74.5 % (ref 43.0–77.0)
Platelets: 555 10*3/uL — ABNORMAL HIGH (ref 150.0–400.0)
RBC: 3.94 Mil/uL — AB (ref 4.22–5.81)
RDW: 19.1 % — ABNORMAL HIGH (ref 11.5–15.5)
WBC: 15.5 10*3/uL — AB (ref 4.0–10.5)

## 2017-06-04 LAB — FOLATE

## 2017-06-04 LAB — VITAMIN B12: VITAMIN B 12: 233 pg/mL (ref 211–911)

## 2017-06-04 NOTE — Progress Notes (Signed)
Subjective:    Patient ID: Gregory Alias., male    DOB: 29-Jun-1961, 56 y.o.   MRN: [id number]  Chief Complaint  Patient presents with  . Hospitalization Follow-up  Pt is accompanied by his sister, Gregory [name].  HPI   Pt with pmh sig for crohn's dz, depression, GERD, renal calculi. Patient was seen today for f/u .  Patient recently hospitalized with symptomatic anemia, hemoglobin 6.7, pt s/p 1 unit pRBC.  Patient also treated for HCAP with IV vancomycin and cefepime.  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 Cytoscopy was used for stent exchange on 9/20.  Pt d/c to rehab with levaquin to complete the course of abx.    Since time of discharge patient has remained weak.  He does endorse feeling somewhat better and he did prior to going to the hospital. Patient denies fever, chills, cough. His main concern is about whether or not the  Pneumonia has resolved.  Past Medical History:  Diagnosis Date  . Acute endocarditis   . Crohn's disease ([place])   . Depression   . Eating disorder   . GERD (gastroesophageal reflux disease)   . Kidney stones   . Short gut syndrome             Allergies  Allergen Reactions  . Methotrexate Derivatives Other (See Comments)    Anemia, low WBC, severe GI symptoms,   . Humira [Adalimumab] Other (See Comments)    Intolerance  . Penicillins Other (See Comments)    Has patient had a PCN reaction causing immediate rash, facial/tongue/throat swelling, SOB or lightheadedness with hypotension: Unknown Has patient had a PCN reaction causing severe rash involving mucus membranes or skin necrosis: Unknown Has patient had a PCN reaction that required hospitalization: Unknown Has patient had a PCN reaction occurring within the last 10 years: No If all of the above answers are "NO", then may proceed with Cephalosporin use.   . Remicade [Infliximab] Other (See Comments)    Intolerance    ROS General: Denies fever,  chills, night sweats, changes in weight, changes in appetite  +changes in weight and appetite, tired HEENT: Denies headaches, ear pain, changes in vision, rhinorrhea, sore throat CV: Denies CP, palpitations, SOB, orthopnea Pulm: Denies SOB, cough, wheezing GI: Denies abdominal pain, nausea, vomiting, diarrhea, constipation GU: Denies dysuria, hematuria, frequency, vaginal discharge Msk: Denies muscle cramps, joint pains Neuro: Denies weakness, numbness, tingling Skin: Denies rashes, bruising Psych: Denies depression, anxiety, hallucinations     Objective:    Blood pressure 90/72, pulse 92, temperature 97.6 F (36.4 C), temperature source Oral, weight 108 lb 12.8 oz (49.4 kg).   Gen. Pleasant, thin, malnourished, in no distress, normal affect.  Skin color better than last OFV, pt sitting up more this visit than last. Was more hunched over at last OFV. HEENT: [place]/AT, face symmetric, no scleral icterus, PERRLA, nares patent without drainage, pharynx without erythema or exudate. Lungs: no accessory muscle use, CTAB, no wheezes or rales. Cardiovascular: RRR, no m/r/g, no peripheral edema Abdomen: cachectic, soft and non-tender, no hepatosplenomegaly, BS normal. Musculoskeletal: Thin extremities, No deformities, no cyanosis or clubbing, normal tone Neuro:  A&Ox3, CN II-XII intact, normal gait Skin:  Warm, no lesions/ rash   Wt Readings from Last 3 Encounters:  06/04/17 108 lb 12.8 oz (49.4 kg)  05/18/17 119 lb 14.9 oz (54.4 kg)  05/14/17 111 lb 8 oz (50.6 kg)    Diabetic Foot Exam - Simple   No  data filed     Lab Results  Component Value Date   WBC 16.5 (H) 05/22/2017   HGB 8.5 (L) 05/22/2017   HCT 26.1 (L) 05/22/2017   PLT 328 05/22/2017   GLUCOSE 92 05/22/2017   ALT 27 05/18/2017   AST 40 05/18/2017   NA 139 05/22/2017   K 5.1 05/22/2017   CL 108 05/22/2017   CREATININE 0.94 05/22/2017   BUN 19 05/22/2017   CO2 24 05/22/2017   INR 1.18 05/18/2017     Assessment/Plan:  Anemia, unspecified type -s/p 1 u pRBCs in hospital. -hgb  8.5 on 05/22/17 -discussed causes of anemia (iron def, pernicious, chronic dz, bleeding, etc.) -will recheck hgb -Plan: CBC with Differential/Platelet, CBC with Differential/Platelet  Crohn's disease of small and large intestines with complication ([place])  -stable -continue f/u with GI  Dr. Havery Moros -continue lomotil and oxycodone 5 mg -continue Multivitamins, vit D, K-dur, folvite, pro-stat. - Plan: Basic metabolic panel, Vitamin X38, Folate  Severe protein-calorie malnutrition ([place]) -Reviewed recommended daily protein intake of 100-115g -Pt encouraged to call pharmacy to see availability of Prostack supplement drink, as he enjoyed this in the hospital.  Hypotension -bp 90/72 -encouraged to increase po intake of water and food as tolerated. -Advised to move slowly from sitting to standing, laying to sitting, etc. -continue Midodrine 10 mg   F/u prn.

## 2017-06-04 NOTE — Patient Instructions (Addendum)
Your daily protein goal is 100-115g.   I will try to contact the nutritionist in the hospital to verify the name of the protein drink you had.  If I am able to gather that information we will contact you.     Anemia, Nonspecific Anemia is a condition in which the concentration of red blood cells or hemoglobin in the blood is below normal. Hemoglobin is a substance in red blood cells that carries oxygen to the tissues of the body. Anemia results in not enough oxygen reaching these tissues. What are the causes? Common causes of anemia include:  Excessive bleeding. Bleeding may be internal or external. This includes excessive bleeding from periods (in women) or from the intestine.  Poor nutrition.  Chronic kidney, thyroid, and liver disease.  Bone marrow disorders that decrease red blood cell production.  Cancer and treatments for cancer.  HIV, AIDS, and their treatments.  Spleen problems that increase red blood cell destruction.  Blood disorders.  Excess destruction of red blood cells due to infection, medicines, and autoimmune disorders.  What are the signs or symptoms?  Minor weakness.  Dizziness.  Headache.  Palpitations.  Shortness of breath, especially with exercise.  Paleness.  Cold sensitivity.  Indigestion.  Nausea.  Difficulty sleeping.  Difficulty concentrating. Symptoms may occur suddenly or they may develop slowly. How is this diagnosed? Additional blood tests are often needed. These help your health care provider determine the best treatment. Your health care provider will check your stool for blood and look for other causes of blood loss. How is this treated? Treatment varies depending on the cause of the anemia. Treatment can include:  Supplements of iron, vitamin K09, or folic acid.  Hormone medicines.  A blood transfusion. This may be needed if blood loss is severe.  Hospitalization. This may be needed if there is significant continual  blood loss.  Dietary changes.  Spleen removal.  Follow these instructions at home: Keep all follow-up appointments. It often takes many weeks to correct anemia, and having your health care provider check on your condition and your response to treatment is very important. Get help right away if:  You develop extreme weakness, shortness of breath, or chest pain.  You become dizzy or have trouble concentrating.  You develop heavy vaginal bleeding.  You develop a rash.  You have bloody or black, tarry stools.  You faint.  You vomit up blood.  You vomit repeatedly.  You have abdominal pain.  You have a fever or persistent symptoms for more than 2-3 days.  You have a fever and your symptoms suddenly get worse.  You are dehydrated. This information is not intended to replace advice given to you by your health care provider. Make sure you discuss any questions you have with your health care provider. Document Released: 09/24/2004 Document Revised: 01/29/2016 Document Reviewed: 02/10/2013 Elsevier Interactive Patient Education  2017 Elsevier Inc. Malnutrition Malnutrition is any condition in which nutrition is poor. There are many forms of malnutrition. A common form is having too little of one kind of nutrient (nutritional deficiency). Nutrients include proteins, minerals, carbohydrates, fats, and vitamins. They provide the body with energy and keep the body working normally. Malnutrition ranges from mild to severe. The condition affects the body's defense system (immune system). Because of this, people who are malnourished are more likely to develop health problems and get sick. What are the causes? Causes of malnutrition include:  Eating an unbalanced diet.  Eating too much of certain foods.  Eating too little.  Conditions that decrease the body's ability to use nutrients.  What increases the risk? Risk factors include:  Pregnancy and lactation. Women who are pregnant  may become malnourished if they do not increase their nutrient intake. They are also susceptible to folic acid deficiency.  Increasing age. The body's ability to absorb nutrients decreases with age. This can contribute to iron, calcium, and vitamin D deficiencies.  Alcohol or drug dependency. Addiction often leads to a lifestyle in which proper nourishment is ignored. Dependency can also hurt the metabolism and the body's ability to absorb nutrients. Alcoholism is a major cause of thiamine deficiency and can lead to deficiencies of magnesium, zinc, and other vitamins.  Eating disorders, such as anorexia nervosa. People with these disorders may eat too little or too much.  Chewing or swallowing problems. People with these disorders may not eat enough.  Certain diseases, including: ? Long-lasting (chronic) diseases. Chronic diseases tend to affect the absorption of calcium, iron, and vitamins B12, A, D, E, and K. ? Liver disease. Liver disease affects the storage of vitamins A and B12. It also interferes with the metabolism of protein and energy sources. ? Kidney disease. Kidney disease may cause deficiencies of protein, iron, and vitamin D. ? Cancer or AIDS. These diseases can cause a loss of appetite. ? Cystic fibrosis. This disease can make it difficult for the body to absorb nutrients.  Certain diets, including. ? The vegetarian diet. Vegetarians are at risk for iron deficiency. ? The vegan diet. Vegans are susceptible to vitamin B12, calcium, iron, vitamin D, and zinc deficiencies. ? The fruitarian diet. This diet can be deficient in protein, sodium, and many micronutrients. ? Many commercial "fad" diets, including those that claim to enhance well-being and reduce weight. ? Very low calorie diets.  Low income. People with a low income may have trouble paying for nutritious foods.  What are the signs or symptoms? Signs and symptoms depend on the kind of malnutrition you have. Common  symptoms include:  Fatigue.  Weakness.  Dizziness.  Fainting  Weight loss.  Poor immune response.  Lack of menstruation.  Hair loss.  Poor memory.  How is this diagnosed? Malnutrition may be diagnosed by:  A medical history.  A dietary history.  A physical exam. This may include a measurement of your body mass index (BMI).  Blood tests.  How is this treated? Treatments vary depending on the cause of the malnutrition. Common treatments include:  Dietary changes.  Dietary supplements, such as vitamins and minerals.  Treatment of any underlying conditions.  Follow these instructions at home:  Eat a balanced diet.  Take dietary supplements as directed by your health care provider.  Exercise regularly. Exercising can improve appetite.  Keep all follow-up visits as directed by your health care provider. This is important. How is this prevented? Eating a well-balanced diet helps to prevent most forms of malnutrition. Contact a health care provider if:  You have increased weakness or fatigue.  You faint.  You stop menstruating.  You have rapid hair loss.  You have unexpected weight loss. This information is not intended to replace advice given to you by your health care provider. Make sure you discuss any questions you have with your health care provider. Document Released: 07/03/2005 Document Revised: 01/23/2016 Document Reviewed: 04/13/2014 Elsevier Interactive Patient Education  [employer].

## 2017-06-23 DIAGNOSIS — K5090 Crohn's disease, unspecified, without complications: Secondary | ICD-10-CM | POA: Diagnosis not present

## 2017-06-28 ENCOUNTER — Other Ambulatory Visit: Payer: Self-pay | Admitting: Gastroenterology

## 2017-06-28 NOTE — Telephone Encounter (Signed)
You can give him 1 bottle with no refills. I will see him in < 2 weeks in clinic for follow up. Thanks

## 2017-06-28 NOTE — Telephone Encounter (Signed)
Ok to refill Lomotil? #120 with 3 Refills? Pt last seen 01-2017.

## 2017-06-29 ENCOUNTER — Other Ambulatory Visit: Payer: Self-pay | Admitting: Gastroenterology

## 2017-06-29 NOTE — Telephone Encounter (Signed)
Faxed script to pharmacy 06-29-17.

## 2017-07-12 ENCOUNTER — Ambulatory Visit (INDEPENDENT_AMBULATORY_CARE_PROVIDER_SITE_OTHER): Payer: Medicare Other | Admitting: Gastroenterology

## 2017-07-12 ENCOUNTER — Encounter: Payer: Self-pay | Admitting: Gastroenterology

## 2017-07-12 VITALS — BP 104/68 | HR 66 | Ht 71.0 in | Wt 110.0 lb

## 2017-07-12 DIAGNOSIS — J3489 Other specified disorders of nose and nasal sinuses: Secondary | ICD-10-CM

## 2017-07-12 DIAGNOSIS — K912 Postsurgical malabsorption, not elsewhere classified: Secondary | ICD-10-CM

## 2017-07-12 DIAGNOSIS — N202 Calculus of kidney with calculus of ureter: Secondary | ICD-10-CM | POA: Diagnosis not present

## 2017-07-12 DIAGNOSIS — D649 Anemia, unspecified: Secondary | ICD-10-CM

## 2017-07-12 DIAGNOSIS — K90829 Short bowel syndrome, unspecified: Secondary | ICD-10-CM

## 2017-07-12 DIAGNOSIS — K5080 Crohn's disease of both small and large intestine without complications: Secondary | ICD-10-CM | POA: Diagnosis not present

## 2017-07-12 DIAGNOSIS — Z933 Colostomy status: Secondary | ICD-10-CM

## 2017-07-12 DIAGNOSIS — N200 Calculus of kidney: Secondary | ICD-10-CM

## 2017-07-12 MED ORDER — IPRATROPIUM BROMIDE 0.03 % NA SOLN: 2.0000 | Freq: Two times a day (BID) | NASAL | 2 refills | Status: DC

## 2017-07-12 MED ORDER — FERROUS SULFATE 325 (65 FE) MG PO TABS: 325.0000 mg | ORAL_TABLET | Freq: Two times a day (BID) | ORAL | 1 refills | Status: DC

## 2017-07-12 NOTE — Progress Notes (Signed)
HPI :  Chron's history Diagnosed with Crohn's disease in 51s. 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 He has a ileostomy with a remnant rectal pouch. He reports he has had a bowel surgery in the 1982, with a "few more surgeries" in years after while in [place], as well as in [facility], they tried reversing his ileostomy which did not work well for him, too much diarrhea and it was then reversed back to ileostomy. He has a history of abscess associated with small bowel.   He is chronic loose stools from the ostomy. He has historically been on chronic codeine 52m every 6-12 hours for diarrhea - which was stopped [place] He has been hospitalized multiple times in recent years, the last time was 2 years ago due to dehydration from a Crohn's flare. He reports multiple ER visits. He is taking lomotil as well to help with this.   He has been on chronic prednisone for 10 years with fluctuating levels. He was Cimzia for about 4-5 years in the past.  He has been on sulfasalazine for a long time. He reports an allergy to 6MP - he does not know the details of the reaction. He had an allergic reaction - hives, to both Remicade and Humira. Allergy to methotrexate, now on Entyvio.  SINCE LAST VISIT  Since the last time I seen this patient he's had a very complicated course.  CT enterography 03/05/17 - long segment of inflammation of the mid to distal small bowel, possible low grade obstruction We stopped CImzia, stopped sulfasalazine. Started methotrexate 244mSC q week and folic acid Tapered prednisone and eventually stopped it  He was admitted in August with complicated infection related to renal stones. Sounds like he had a severe reaction / intolerance to methotrexate (poor PO intake, vomiting) as well as bone marrow suppresion. Methotrexate was stopped.  Got records from last 2 visits or so from his prior  primary GI.  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 He's been on this since September. Generally he reports this is the best his Crohn's has been in a very long time, he reports some noticeable difference. He denies any abdominal pains that are bothering him, normally his main complaint. He is having chronic loose stools which seems stable. He has roughly 4-5 bowel movements per day while on lomotil 4 tabs per day. He reports his weight has been stable) 110 pounds, is much better appetite at this time and feels like he is eating much better. His main complaint is low energy since his hospitalization. He otherwise endorses rhinorrhea since starting Entyvio. He's had hypoalbuminemia on his prior LFTs but denies any edema. He is trying to drink supplemental shakes to maintain his weight. He has a follow-up with his urologist today.   Overall, he does endorse a significant improvement in regards to his Crohn's symptoms since starting Entyvio.     Past Medical History:  Diagnosis Date  . Acute endocarditis   . [place]  . Depression   . Eating disorder   . GERD (gastroesophageal reflux disease)   . Kidney stones   . Short gut syndrome  Past Surgical History:  Procedure Laterality Date  . ILEOSTOMY    . IR NEPHROSTOMY EXCHANGE LEFT  04/12/2017  . IR NEPHROSTOMY PLACEMENT LEFT  04/12/2017  . IR NEPHROSTOMY PLACEMENT LEFT  04/29/2017  . SMALL INTESTINE SURGERY    . SUBTOTAL COLECTOMY     Family History  Problem Relation Age of Onset  . Heart disease Mother   . Diabetes Father   . Heart disease Father   . Heart attack Father   . Melanoma Sister   . Crohn's disease Brother    . Inflammatory bowel disease Brother   . Diabetes Paternal Grandmother    Social History   Tobacco Use  . Smoking status: Former Smoker    Packs/day: 1.00    Years: 20.00    Pack years: 20.00    Types: Cigarettes    Last attempt to quit: 08/31/2006    Years since quitting: 10.8  . Smokeless tobacco: Never Used  Substance Use Topics  . Alcohol use: No  . Drug use: No   Current Outpatient Medications  Medication Sig Dispense Refill  . cholecalciferol (VITAMIN D) 400 units TABS tablet Take 400 Units by mouth daily.    . diphenoxylate-atropine (LOMOTIL) 2.5-0.025 MG tablet Take 1 Tablet by mouth 4 times a day 213 tablet 0  . folic acid (FOLVITE) 1 MG tablet Take 1 Tablet by mouth once daily 30 tablet 3  . midodrine (PROAMATINE) 10 MG tablet Take 1 tablet (10 mg total) by mouth 2 (two) times daily with a meal. 60 tablet 3  . Multiple Vitamin (MULTIVITAMIN WITH MINERALS) TABS tablet Take 1 tablet by mouth daily. 30 tablet 3  . ondansetron (ZOFRAN-ODT) 4 MG disintegrating tablet Take 1 tablet (4 mg total) by mouth every 8 (eight) hours as needed for nausea or vomiting. 30 tablet 0  . potassium chloride SA (K-DUR,KLOR-CON) 20 MEQ tablet Take 1 tablet (20 mEq total) by mouth as directed. (Patient taking differently: Take 20 mEq by mouth daily. ) 90 tablet 3  . ranitidine (ZANTAC) 150 MG tablet Take 1 tablet (150 mg total) by mouth 2 (two) times daily. 60 tablet 6  . sertraline (ZOLOFT) 50 MG tablet Take 1 tablet (50 mg total) by mouth daily. 30 tablet 1  . tamsulosin (FLOMAX) 0.4 MG CAPS capsule Take 0.4 mg by mouth daily.  11   No current facility-administered medications for this visit.    Allergies  Allergen Reactions  . Methotrexate Derivatives Other (See Comments)    Anemia, low WBC, severe GI symptoms,   . Humira [Adalimumab] Other (See Comments)    Intolerance  . Penicillins Other (See Comments)    Has patient had a PCN reaction causing immediate rash, facial/tongue/throat  swelling, SOB or lightheadedness with hypotension: Unknown Has patient had a PCN reaction causing severe rash involving mucus membranes or skin necrosis: Unknown Has patient had a PCN reaction that required hospitalization: Unknown Has patient had a PCN reaction occurring within the last 10 years: No If all of the above answers are "NO", then may proceed with Cephalosporin use.   . Remicade [Infliximab] Other (See Comments)    Intolerance     Review of Systems: All systems reviewed and negative except where noted in HPI.   Lab Results  Component Value Date   WBC 15.5 (H) 06/04/2017   HGB 10.3 (L) 06/04/2017   HCT 33.7 (L) 06/04/2017   MCV 85.7 06/04/2017   PLT 555.0 (H) 06/04/2017    Lab Results  Component Value  Date   CREATININE 1.02 06/04/2017   BUN 8 06/04/2017   NA 135 06/04/2017   K 3.7 06/04/2017   CL 99 06/04/2017   CO2 26 06/04/2017    Lab Results  Component Value Date   ALT 27 05/18/2017   AST 40 05/18/2017   ALKPHOS 121 05/18/2017   BILITOT 0.7 05/18/2017      Physical Exam: BP 104/68   Pulse 66   Ht _0  (1.803 m)   Wt 110 lb (49.9 kg)   BMI 15.34 kg/m  Constitutional: Pleasant, thin appearingmale in no acute distress. HEENT: Normocephalic and atraumatic. Conjunctivae are normal. No scleral icterus. Neck supple.  Cardiovascular: Normal rate, regular rhythm.  Pulmonary/chest: Effort normal and breath sounds normal. No wheezing, rales or rhonchi. Abdominal: Soft, nondistended, nontender. Ostomy in LLQ There are no masses palpable. No hepatomegaly. Extremities: no edema Lymphadenopathy: No cervical adenopathy noted. Neurological: Alert and oriented to person place and time. Skin: Skin is warm and dry. No rashes noted. Psychiatric: Normal mood and affect. Behavior is normal.   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 He was managed with Cimzia when I met him, he felt didn't help at all, and his enterography study showed significant active inflammation. We have stopped symptoms and eventually transitioned to [facility] after he recovered from his renal infection. Generally doing much better since starting Entyvio regards his Crohns symptoms which is very encouraging. He otherwise is having some rhinorrhea likely related to [facility], a known side effect.  Recommend at this time: - continue with Entyvio as prescribed - trial of atrovent nasal spray for treatment of associated rhinorrhea - start ferrous sulfate 368m BID for anemia - repeat CBC, CMET, TIBC, ferritin in 4-6 weeks - supplemental protein shakes - continue lomotil PRN - restage Crohns with colonoscopy or enterography study in 6 months - consideration for testosterone levels given ongoing fatigue, he also reports ED, defer to Urology - Flu shot and vaccines UTD - follow up in clinic in 3 months or sooner with questions / concerns.   SCarolina Cellar MD [facility] Pager [phone] CC: BBillie Ruddy MD

## 2017-07-12 NOTE — Patient Instructions (Addendum)
If you are age 56 or older, your body mass index should be between 23-30. Your Body mass index is 15.34 kg/m. If this is out of the aforementioned range listed, please consider follow up with your Primary Care Provider.  If you are age 74 or younger, your body mass index should be between 19-25. Your Body mass index is 15.34 kg/m. If this is out of the aformentioned range listed, please consider follow up with your Primary Care Provider.   We have sent the following medications to your pharmacy for you to pick up at your convenience: Ferrous Sulfate Atrovent  Your physician has requested that you go to the basement for lab work in 4 to 6 weeks.  Dr. Havery Moros would like to see you back in the office in February.  Please call us at [phone] to schedule in December.  Thank you

## 2017-07-20 ENCOUNTER — Ambulatory Visit: Payer: Self-pay | Admitting: Family Medicine

## 2017-07-29 ENCOUNTER — Telehealth: Payer: Self-pay | Admitting: Emergency Medicine

## 2017-07-29 ENCOUNTER — Ambulatory Visit (INDEPENDENT_AMBULATORY_CARE_PROVIDER_SITE_OTHER): Payer: Medicare Other | Admitting: Family Medicine

## 2017-07-29 ENCOUNTER — Encounter: Payer: Self-pay | Admitting: Family Medicine

## 2017-07-29 VITALS — BP 110/90 | HR 77 | Temp 98.3°F | Wt 113.8 lb

## 2017-07-29 DIAGNOSIS — G8928 Other chronic postprocedural pain: Secondary | ICD-10-CM

## 2017-07-29 DIAGNOSIS — F419 Anxiety disorder, unspecified: Secondary | ICD-10-CM

## 2017-07-29 DIAGNOSIS — F329 Major depressive disorder, single episode, unspecified: Secondary | ICD-10-CM

## 2017-07-29 DIAGNOSIS — F32A Depression, unspecified: Secondary | ICD-10-CM

## 2017-07-29 DIAGNOSIS — G4709 Other insomnia: Secondary | ICD-10-CM

## 2017-07-29 MED ORDER — SERTRALINE HCL 50 MG PO TABS: 50.0000 mg | ORAL_TABLET | Freq: Every day | ORAL | 6 refills | Status: DC

## 2017-07-29 MED ORDER — TRAMADOL HCL 50 MG PO TABS: 50.0000 mg | ORAL_TABLET | Freq: Three times a day (TID) | ORAL | 1 refills | Status: DC | PRN

## 2017-07-29 NOTE — Progress Notes (Signed)
Subjective:    Patient ID: Gregory Alias., male    DOB: September 06, 1960, 56 y.o.   MRN: [id number]  No chief complaint on file.   HPI Patient was seen today for acute concerns.  Anxiety/depression: -In the past patient was on Zoloft 50 mg.  Medicine may have been inadvertently discontinued during hospital stay. -Patient endorses decreased mood, snapping at sister, decreased energy, poor sleep.  Concentration is okay.  Patient denies SI/HI -Patient also endorses increased increased anxiety when going out in public.  States had a panic attack recently. -Patient is interested in counseling  Insomnia: -Patient states may get 1-2 hours of sleep per night -Think started about 2 weeks before Thanksgiving. -Patient endorses increased stress and feeling like mind is racing when he tries to sleep at night. -Pain also keeps pt up at night.  Chronic pain: -Patient with a history of Crohn's disease.  Has undergone multiple abdominal surgeries. -In the past patient was taking codeine 30 mg every 6 to 12 hours for pain and to cause constipation as was having frequent loose stools in colostomy bag. -Pt has not had codeine 30 mg since moving to the area. -Patient states Tylenol is not helping with his pain.    Past Medical History:  Diagnosis Date  . Acute endocarditis   . Crohn's disease ([place])   . Depression   . Eating disorder   . GERD (gastroesophageal reflux disease)   . Kidney stones   . Short gut syndrome     Allergies  Allergen Reactions  . Methotrexate Derivatives Other (See Comments)    Anemia, low WBC, severe GI symptoms,   . Humira [Adalimumab] Other (See Comments)    Intolerance  . Penicillins Other (See Comments)    Has patient had a PCN reaction causing immediate rash, facial/tongue/throat swelling, SOB or lightheadedness with hypotension: Unknown Has patient had a PCN reaction causing severe rash involving mucus membranes or skin necrosis: Unknown Has patient had a PCN  reaction that required hospitalization: Unknown Has patient had a PCN reaction occurring within the last 10 years: No If all of the above answers are "NO", then may proceed with Cephalosporin use.   . Remicade [Infliximab] Other (See Comments)    Intolerance    ROS General: Denies fever, chills, night sweats, changes in weight, changes in appetite   +insomnia HEENT: Denies headaches, ear pain, changes in vision, rhinorrhea, sore throat CV: Denies CP, palpitations, SOB, orthopnea Pulm: Denies SOB, cough, wheezing GI: Denies abdominal pain, nausea, vomiting, diarrhea, constipation GU: Denies dysuria, hematuria, frequency, vaginal discharge Msk: Denies muscle cramps, joint pains   +chronic pain Neuro: Denies weakness, numbness, tingling Skin: Denies rashes, bruising Psych: Denies hallucinations  +depression/anxiety     Objective:    Blood pressure 110/90, pulse 77, temperature 98.3 F (36.8 C), temperature source Oral, weight 113 lb 12.8 oz (51.6 kg).   Gen. Pleasant, well-nourished, in no distress, normal affect, mood slightly depressed HEENT: [place]/AT, face symmetric, no scleral icterus, PERRLA, nares patent without drainage, pharynx without erythema or exudate. Lungs: no accessory muscle use, CTAB, no wheezes or rales Cardiovascular: RRR, no m/r/g, no peripheral edema Abdomen: BS present, soft, NT/ND, colostomy in place Neuro:  A&Ox3, CN II-XII intact,  Skin:  Warm, no lesions/ rash   Wt Readings from Last 3 Encounters:  07/29/17 113 lb 12.8 oz (51.6 kg)  07/12/17 110 lb (49.9 kg)  06/04/17 108 lb 12.8 oz (49.4 kg)    Lab Results  Component Value Date  WBC 15.5 (H) 06/04/2017   HGB 10.3 (L) 06/04/2017   HCT 33.7 (L) 06/04/2017   PLT 555.0 (H) 06/04/2017   GLUCOSE 94 06/04/2017   ALT 27 05/18/2017   AST 40 05/18/2017   NA 135 06/04/2017   K 3.7 06/04/2017   CL 99 06/04/2017   CREATININE 1.02 06/04/2017   BUN 8 06/04/2017   CO2 26 06/04/2017   INR 1.18 05/18/2017     Assessment/Plan:  Other chronic postprocedural pain  -h/o chronic pain s/p multiple abd surgeries - Plan: Ambulatory referral to Pain Clinic, traMADol (ULTRAM) 50 MG tablet  Other insomnia -Sleep hygiene -Hopefully tramadol will provide some pain relief patient is able to go to sleep.  Also hoping restarting Zoloft will give some relief. -Patient to schedule appointment with area psychiatrist  Anxiety and depression  -PHQ 9 score 15 -Gad 7 score 16 -will restart Zoloft 50 mg.  Discussed may take a few weeks for affect to be noticed. -Given handout for Psychiatry offices in town. - Plan: sertraline (ZOLOFT) 50 MG tablet   Patient to follow-up in the next few weeks.

## 2017-07-29 NOTE — Telephone Encounter (Signed)
Attempted to contact patient regarding appointment scheduled for 07/29/2017 regarding needing medication. Trying to contact patient to see which medication patient needed. Was unable to leave a VM for patient due to the mailbox being full.

## 2017-07-29 NOTE — Patient Instructions (Addendum)
Chronic Pain, Adult Chronic pain is a type of pain that lasts or keeps coming back (recurs) for at least six months. You may have chronic headaches, abdominal pain, or body pain. Chronic pain may be related to an illness, such as fibromyalgia or complex regional pain syndrome. Sometimes the cause of chronic pain is not known. Chronic pain can make it hard for you to do daily activities. If not treated, chronic pain can lead to other health problems, including anxiety and depression. Treatment depends on the cause and severity of your pain. You may need to work with a pain specialist to come up with a treatment plan. The plan may include medicine, counseling, and physical therapy. Many people benefit from a combination of two or more types of treatment to control their pain. Follow these instructions at home: Lifestyle  Consider keeping a pain diary to share with your health care providers.  Consider talking with a mental health care provider (psychologist) about how to cope with chronic pain.  Consider joining a chronic pain support group.  Try to control or lower your stress levels. Talk to your health care provider about strategies to do this. General instructions   Take over-the-counter and prescription medicines only as told by your health care provider.  Follow your treatment plan as told by your health care provider. This may include: ? Gentle, regular exercise. ? Eating a healthy diet that includes foods such as vegetables, fruits, fish, and lean meats. ? Cognitive or behavioral therapy. ? Working with a Community education officer. ? Meditation or yoga. ? Acupuncture or massage therapy. ? Aroma, color, light, or sound therapy. ? Local electrical stimulation. ? Shots (injections) of numbing or pain-relieving medicines into the spine or the area of pain.  Check your pain level as told by your health care provider. Ask your health care provider if you should use a pain scale.  Learn as  much as you can about how to manage your chronic pain. Ask your health care provider if an intensive pain rehabilitation program or a chronic pain specialist would be helpful.  Keep all follow-up visits as told by your health care provider. This is important. Contact a health care provider if:  Your pain gets worse.  You have new pain.  You have trouble sleeping.  You have trouble doing your normal activities.  Your pain is not controlled with treatment.  Your have side effects from pain medicine.  You feel weak. Get help right away if:  You lose feeling or have numbness in your body.  You lose control of bowel or bladder function.  Your pain suddenly gets much worse.  You develop shaking or chills.  You develop confusion.  You develop chest pain.  You have trouble breathing or shortness of breath.  You pass out.  You have thoughts about hurting yourself or others. This information is not intended to replace advice given to you by your health care provider. Make sure you discuss any questions you have with your health care provider. Document Released: 05/09/2002 Document Revised: 04/16/2016 Document Reviewed: 02/04/2016 Elsevier Interactive Patient Education  2017 [employer] After being diagnosed with an anxiety disorder, you may be relieved to know why you have felt or behaved a certain way. It is natural to also feel overwhelmed about the treatment ahead and what it will mean for your life. With care and support, you can manage this condition and recover from it. How to cope with anxiety Dealing with  stress Stress is your body's reaction to life changes and events, both good and bad. Stress can last just a few hours or it can be ongoing. Stress can play a major role in anxiety, so it is important to learn both how to cope with stress and how to think about it differently. Talk with your health care provider or a counselor to learn more about  stress reduction. He or she may suggest some stress reduction techniques, such as:  Music therapy. This can include creating or listening to music that you enjoy and that inspires you.  Mindfulness-based meditation. This involves being aware of your normal breaths, rather than trying to control your breathing. It can be done while sitting or walking.  Centering prayer. This is a kind of meditation that involves focusing on a word, phrase, or sacred image that is meaningful to you and that brings you peace.  Deep breathing. To do this, expand your stomach and inhale slowly through your nose. Hold your breath for 3-5 seconds. Then exhale slowly, allowing your stomach muscles to relax.  Self-talk. This is a skill where you identify thought patterns that lead to anxiety reactions and correct those thoughts.  Muscle relaxation. This involves tensing muscles then relaxing them.  Choose a stress reduction technique that fits your lifestyle and personality. Stress reduction techniques take time and practice. Set aside 5-15 minutes a day to do them. Therapists can offer training in these techniques. The training may be covered by some insurance plans. Other things you can do to manage stress include:  Keeping a stress diary. This can help you learn what triggers your stress and ways to control your response.  Thinking about how you respond to certain situations. You may not be able to control everything, but you can control your reaction.  Making time for activities that help you relax, and not feeling guilty about spending your time in this way.  Therapy combined with coping and stress-reduction skills provides the best chance for successful treatment. Medicines Medicines can help ease symptoms. Medicines for anxiety include:  Anti-anxiety drugs.  Antidepressants.  Beta-blockers.  Medicines may be used as the main treatment for anxiety disorder, along with therapy, or if other treatments are  not working. Medicines should be prescribed by a health care provider. Relationships Relationships can play a big part in helping you recover. Try to spend more time connecting with trusted friends and family members. Consider going to couples counseling, taking family education classes, or going to family therapy. Therapy can help you and others better understand the condition. How to recognize changes in your condition Everyone has a different response to treatment for anxiety. Recovery from anxiety happens when symptoms decrease and stop interfering with your daily activities at home or work. This may mean that you will start to:  Have better concentration and focus.  Sleep better.  Be less irritable.  Have more energy.  Have improved memory.  It is important to recognize when your condition is getting worse. Contact your health care provider if your symptoms interfere with home or work and you do not feel like your condition is improving. Where to find help and support: You can get help and support from these sources:  Self-help groups.  Online and [employer].  A trusted spiritual leader.  Couples counseling.  Family education classes.  Family therapy.  Follow these instructions at home:  Eat a healthy diet that includes plenty of vegetables, fruits, whole grains, low-fat dairy products, and  lean protein. Do not eat a lot of foods that are high in solid fats, added sugars, or salt.  Exercise. Most adults should do the following: ? Exercise for at least 150 minutes each week. The exercise should increase your heart rate and make you sweat (moderate-intensity exercise). ? Strengthening exercises at least twice a week.  Cut down on caffeine, tobacco, alcohol, and other potentially harmful substances.  Get the right amount and quality of sleep. Most adults need 7-9 hours of sleep each night.  Make choices that simplify your life.  Take over-the-counter and  prescription medicines only as told by your health care provider.  Avoid caffeine, alcohol, and certain over-the-counter cold medicines. These may make you feel worse. Ask your pharmacist which medicines to avoid.  Keep all follow-up visits as told by your health care provider. This is important. Questions to ask your health care provider  Would I benefit from therapy?  How often should I follow up with a health care provider?  How long do I need to take medicine?  Are there any long-term side effects of my medicine?  Are there any alternatives to taking medicine? Contact a health care provider if:  You have a hard time staying focused or finishing daily tasks.  You spend many hours a day feeling worried about everyday life.  You become exhausted by worry.  You start to have headaches, feel tense, or have nausea.  You urinate more than normal.  You have diarrhea. Get help right away if:  You have a racing heart and shortness of breath.  You have thoughts of hurting yourself or others. If you ever feel like you may hurt yourself or others, or have thoughts about taking your own life, get help right away. You can go to your nearest emergency department or call:  Your local emergency services (911 in the [place]  A suicide crisis helpline, such as the [facility] at 503-828-2270. This is open 24-hours a day.  Summary  Taking steps to deal with stress can help calm you.  Medicines cannot cure anxiety disorders, but they can help ease symptoms.  Family, friends, and partners can play a big part in helping you recover from an anxiety disorder. This information is not intended to replace advice given to you by your health care provider. Make sure you discuss any questions you have with your health care provider. Document Released: 08/11/2016 Document Revised: 08/11/2016 Document Reviewed: 08/11/2016 Elsevier Interactive Patient Education  2018  [place] With Depression Everyone experiences occasional disappointment, sadness, and loss in their lives. When you are feeling down, blue, or sad for at least 2 weeks in a row, it may mean that you have depression. Depression can affect your thoughts and feelings, relationships, daily activities, and physical health. It is caused by changes in the way your brain functions. If you receive a diagnosis of depression, your health care provider will tell you which type of depression you have and what treatment options are available to you. If you are living with depression, there are ways to help you recover from it and also ways to prevent it from coming back. How to cope with lifestyle changes Coping with stress Stress is your body's reaction to life changes and events, both good and bad. Stressful situations may include:  Getting married.  The death of a spouse.  Losing a job.  Retiring.  Having a baby.  Stress can last just a few hours or it  can be ongoing. Stress can play a major role in depression, so it is important to learn both how to cope with stress and how to think about it differently. Talk with your health care provider or a counselor if you would like to learn more about stress reduction. He or she may suggest some stress reduction techniques, such as:  Music therapy. This can include creating music or listening to music. Choose music that you enjoy and that inspires you.  Mindfulness-based meditation. This kind of meditation can be done while sitting or walking. It involves being aware of your normal breaths, rather than trying to control your breathing.  Centering prayer. This is a kind of meditation that involves focusing on a spiritual word or phrase. Choose a word, phrase, or sacred image that is meaningful to you and that brings you peace.  Deep breathing. To do this, expand your stomach and inhale slowly through your nose. Hold your breath for 3-5 seconds,  then exhale slowly, allowing your stomach muscles to relax.  Muscle relaxation. This involves intentionally tensing muscles then relaxing them.  Choose a stress reduction technique that fits your lifestyle and personality. Stress reduction techniques take time and practice to develop. Set aside 5-15 minutes a day to do them. Therapists can offer training in these techniques. The training may be covered by some insurance plans. Other things you can do to manage stress include:  Keeping a stress diary. This can help you learn what triggers your stress and ways to control your response.  Understanding what your limits are and saying no to requests or events that lead to a schedule that is too full.  Thinking about how you respond to certain situations. You may not be able to control everything, but you can control how you react.  Adding humor to your life by watching funny films or TV shows.  Making time for activities that help you relax and not feeling guilty about spending your time this way.  Medicines Your health care provider may suggest certain medicines if he or she feels that they will help improve your condition. Avoid using alcohol and other substances that may prevent your medicines from working properly (may interact). It is also important to:  Talk with your pharmacist or health care provider about all the medicines that you take, their possible side effects, and what medicines are safe to take together.  Make it your goal to take part in all treatment decisions (shared decision-making). This includes giving input on the side effects of medicines. It is best if shared decision-making with your health care provider is part of your total treatment plan.  If your health care provider prescribes a medicine, you may not notice the full benefits of it for 4-8 weeks. Most people who are treated for depression need to be on medicine for at least 6-12 months after they feel better. If you  are taking medicines as part of your treatment, do not stop taking medicines without first talking to your health care provider. You may need to have the medicine slowly decreased (tapered) over time to decrease the risk of harmful side effects. Relationships Your health care provider may suggest family therapy along with individual therapy and drug therapy. While there may not be family problems that are causing you to feel depressed, it is still important to make sure your family learns as much as they can about your mental health. Having your family's support can help make your treatment successful. How to  recognize changes in your condition Everyone has a different response to treatment for depression. Recovery from major depression happens when you have not had signs of major depression for two months. This may mean that you will start to:  Have more interest in doing activities.  Feel less hopeless than you did 2 months ago.  Have more energy.  Overeat less often, or have better or improving appetite.  Have better concentration.  Your health care provider will work with you to decide the next steps in your recovery. It is also important to recognize when your condition is getting worse. Watch for these signs:  Having fatigue or low energy.  Eating too much or too little.  Sleeping too much or too little.  Feeling restless, agitated, or hopeless.  Having trouble concentrating or making decisions.  Having unexplained physical complaints.  Feeling irritable, angry, or aggressive.  Get help as soon as you or your family members notice these symptoms coming back. How to get support and help from others How to talk with friends and family members about your condition Talking to friends and family members about your condition can provide you with one way to get support and guidance. Reach out to trusted friends or family members, explain your symptoms to them, and let them know that  you are working with a health care provider to treat your depression. Financial resources Not all insurance plans cover mental health care, so it is important to check with your insurance carrier. If paying for co-pays or counseling services is a problem, search for a local or county mental health care center. They may be able to offer public mental health care services at low or no cost when you are not able to see a private health care provider. If you are taking medicine for depression, you may be able to get the generic form, which may be less expensive. Some makers of prescription medicines also offer help to patients who cannot afford the medicines they need. Follow these instructions at home:  Get the right amount and quality of sleep.  Cut down on using caffeine, tobacco, alcohol, and other potentially harmful substances.  Try to exercise, such as walking or lifting small weights.  Take over-the-counter and prescription medicines only as told by your health care provider.  Eat a healthy diet that includes plenty of vegetables, fruits, whole grains, low-fat dairy products, and lean protein. Do not eat a lot of foods that are high in solid fats, added sugars, or salt.  Keep all follow-up visits as told by your health care provider. This is important. Contact a health care provider if:  You stop taking your antidepressant medicines, and you have any of these symptoms: ? Nausea. ? Headache. ? Feeling lightheaded. ? Chills and body aches. ? Not being able to sleep (insomnia).  You or your friends and family think your depression is getting worse. Get help right away if:  You have thoughts of hurting yourself or others. If you ever feel like you may hurt yourself or others, or have thoughts about taking your own life, get help right away. You can go to your nearest emergency department or call:  Your local emergency services (911 in the [place]  A suicide crisis helpline, such as  the [facility] at 774-398-0870. This is open 24-hours a day.  Summary  If you are living with depression, there are ways to help you recover from it and also ways to prevent it from  coming back.  Work with your health care team to create a management plan that includes counseling, stress management techniques, and healthy lifestyle habits. This information is not intended to replace advice given to you by your health care provider. Make sure you discuss any questions you have with your health care provider. Document Released: 07/20/2016 Document Revised: 07/20/2016 Document Reviewed: 07/20/2016 Elsevier Interactive Patient Education  2018 [employer]. Insomnia Insomnia is a sleep disorder that makes it difficult to fall asleep or to stay asleep. Insomnia can cause tiredness (fatigue), low energy, difficulty concentrating, mood swings, and poor performance at work or school. There are three different ways to classify insomnia:  Difficulty falling asleep.  Difficulty staying asleep.  Waking up too early in the morning.  Any type of insomnia can be long-term (chronic) or short-term (acute). Both are common. Short-term insomnia usually lasts for three months or less. Chronic insomnia occurs at least three times a week for longer than three months. What are the causes? Insomnia may be caused by another condition, situation, or substance, such as:  Anxiety.  Certain medicines.  Gastroesophageal reflux disease (GERD) or other gastrointestinal conditions.  Asthma or other breathing conditions.  Restless legs syndrome, sleep apnea, or other sleep disorders.  Chronic pain.  Menopause. This may include hot flashes.  Stroke.  Abuse of alcohol, tobacco, or illegal drugs.  Depression.  Caffeine.  Neurological disorders, such as Alzheimer disease.  An overactive thyroid (hyperthyroidism).  The cause of insomnia may not be known. What increases the  risk? Risk factors for insomnia include:  Gender. Women are more commonly affected than men.  Age. Insomnia is more common as you get older.  Stress. This may involve your professional or personal life.  Income. Insomnia is more common in people with lower income.  Lack of exercise.  Irregular work schedule or night shifts.  Traveling between different time zones.  What are the signs or symptoms? If you have insomnia, trouble falling asleep or trouble staying asleep is the main symptom. This may lead to other symptoms, such as:  Feeling fatigued.  Feeling nervous about going to sleep.  Not feeling rested in the morning.  Having trouble concentrating.  Feeling irritable, anxious, or depressed.  How is this treated? Treatment for insomnia depends on the cause. If your insomnia is caused by an underlying condition, treatment will focus on addressing the condition. Treatment may also include:  Medicines to help you sleep.  Counseling or therapy.  Lifestyle adjustments.  Follow these instructions at home:  Take medicines only as directed by your health care provider.  Keep regular sleeping and waking hours. Avoid naps.  Keep a sleep diary to help you and your health care provider figure out what could be causing your insomnia. Include: ? When you sleep. ? When you wake up during the night. ? How well you sleep. ? How rested you feel the next day. ? Any side effects of medicines you are taking. ? What you eat and drink.  Make your bedroom a comfortable place where it is easy to fall asleep: ? Put up shades or special blackout curtains to block light from outside. ? Use a white noise machine to block noise. ? Keep the temperature cool.  Exercise regularly as directed by your health care provider. Avoid exercising right before bedtime.  Use relaxation techniques to manage stress. Ask your health care provider to suggest some techniques that may work well for you.  These may include: ? Breathing exercises. ?  Routines to release muscle tension. ? Visualizing peaceful scenes.  Cut back on alcohol, caffeinated beverages, and cigarettes, especially close to bedtime. These can disrupt your sleep.  Do not overeat or eat spicy foods right before bedtime. This can lead to digestive discomfort that can make it hard for you to sleep.  Limit screen use before bedtime. This includes: ? Watching TV. ? Using your smartphone, tablet, and computer.  Stick to a routine. This can help you fall asleep faster. Try to do a quiet activity, brush your teeth, and go to bed at the same time each night.  Get out of bed if you are still awake after 15 minutes of trying to sleep. Keep the lights down, but try reading or doing a quiet activity. When you feel sleepy, go back to bed.  Make sure that you drive carefully. Avoid driving if you feel very sleepy.  Keep all follow-up appointments as directed by your health care provider. This is important. Contact a health care provider if:  You are tired throughout the day or have trouble in your daily routine due to sleepiness.  You continue to have sleep problems or your sleep problems get worse. Get help right away if:  You have serious thoughts about hurting yourself or someone else. This information is not intended to replace advice given to you by your health care provider. Make sure you discuss any questions you have with your health care provider. Document Released: 08/14/2000 Document Revised: 01/17/2016 Document Reviewed: 05/18/2014 Elsevier Interactive Patient Education  [employer].

## 2017-08-03 ENCOUNTER — Telehealth: Payer: Self-pay

## 2017-08-03 ENCOUNTER — Other Ambulatory Visit: Payer: Medicare Other

## 2017-08-03 NOTE — Telephone Encounter (Signed)
Left message for patient to expect a re-enrollment packet for [facility] patient assistance program. Asked that after he has completed his part of the paperwork to bring it in to our office. 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 Instructed him to call if he has questions.

## 2017-08-11 ENCOUNTER — Telehealth: Payer: Self-pay

## 2017-08-11 NOTE — Telephone Encounter (Signed)
-----   Message from Roetta Sessions, [place] sent at 07/13/2017  4:34 PM EST ----- Regarding: labs due Labs due (TIBC and Ferritin, CBCw/ diff, CMET) week of Dec. 17th .  Orders entered.  Call pt to remind

## 2017-08-11 NOTE — Telephone Encounter (Signed)
Called pt and LM for him to call me back regarding needed labwork

## 2017-08-12 NOTE — Telephone Encounter (Signed)
Thanks Jan

## 2017-08-12 NOTE — Telephone Encounter (Signed)
Called and spoke to pt.  He is aware of needed lab work and will go next week, probably on Monday the 17th.  I reminded him of the lab hours.  He expressed understanding.

## 2017-08-12 NOTE — Telephone Encounter (Signed)
FYI

## 2017-08-16 ENCOUNTER — Other Ambulatory Visit (INDEPENDENT_AMBULATORY_CARE_PROVIDER_SITE_OTHER): Payer: Medicare Other

## 2017-08-16 DIAGNOSIS — Z79899 Other long term (current) drug therapy: Secondary | ICD-10-CM | POA: Diagnosis not present

## 2017-08-16 DIAGNOSIS — K5080 Crohn's disease of both small and large intestine without complications: Secondary | ICD-10-CM | POA: Diagnosis not present

## 2017-08-16 DIAGNOSIS — D649 Anemia, unspecified: Secondary | ICD-10-CM

## 2017-08-16 DIAGNOSIS — N200 Calculus of kidney: Secondary | ICD-10-CM

## 2017-08-16 LAB — HEPATIC FUNCTION PANEL
ALT: 11 U/L (ref 0–53)
AST: 11 U/L (ref 0–37)
Albumin: 3 g/dL — ABNORMAL LOW (ref 3.5–5.2)
Alkaline Phosphatase: 75 U/L (ref 39–117)
BILIRUBIN TOTAL: 0.6 mg/dL (ref 0.2–1.2)
Bilirubin, Direct: 0.2 mg/dL (ref 0.0–0.3)
Total Protein: 6.6 g/dL (ref 6.0–8.3)

## 2017-08-16 LAB — COMPREHENSIVE METABOLIC PANEL
ALBUMIN: 3 g/dL — AB (ref 3.5–5.2)
ALK PHOS: 75 U/L (ref 39–117)
ALT: 11 U/L (ref 0–53)
AST: 11 U/L (ref 0–37)
BUN: 8 mg/dL (ref 6–23)
CALCIUM: 7.9 mg/dL — AB (ref 8.4–10.5)
CHLORIDE: 108 meq/L (ref 96–112)
CO2: 23 mEq/L (ref 19–32)
Creatinine, Ser: 0.97 mg/dL (ref 0.40–1.50)
GFR: 84.99 mL/min (ref >60.00)
Glucose, Bld: 97 mg/dL (ref 70–99)
POTASSIUM: 3.8 meq/L (ref 3.5–5.1)
SODIUM: 138 meq/L (ref 135–145)
TOTAL PROTEIN: 6.6 g/dL (ref 6.0–8.3)
Total Bilirubin: 0.6 mg/dL (ref 0.2–1.2)

## 2017-08-16 LAB — CBC WITH DIFFERENTIAL/PLATELET
BASOS PCT: 0.4 % (ref 0.0–3.0)
Basophils Absolute: 0 10*3/uL (ref 0.0–0.1)
EOS PCT: 3.8 % (ref 0.0–5.0)
Eosinophils Absolute: 0.4 10*3/uL (ref 0.0–0.7)
HEMATOCRIT: 38.9 % — AB (ref 39.0–52.0)
HEMOGLOBIN: 12.4 g/dL — AB (ref 13.0–17.0)
Lymphocytes Relative: 26.4 % (ref 12.0–46.0)
Lymphs Abs: 2.6 10*3/uL (ref 0.7–4.0)
MCHC: 32 g/dL (ref 30.0–36.0)
MCV: 85.2 fl (ref 78.0–100.0)
MONO ABS: 0.7 10*3/uL (ref 0.1–1.0)
Monocytes Relative: 7.2 % (ref 3.0–12.0)
Neutro Abs: 6.1 10*3/uL (ref 1.4–7.7)
Neutrophils Relative %: 62.2 % (ref 43.0–77.0)
Platelets: 381 10*3/uL (ref 150.0–400.0)
RBC: 4.57 Mil/uL (ref 4.22–5.81)
RDW: 23.8 % — AB (ref 11.5–15.5)
WBC: 9.8 10*3/uL (ref 4.0–10.5)

## 2017-08-16 LAB — FERRITIN: Ferritin: 35.9 ng/mL (ref 22.0–322.0)

## 2017-08-16 NOTE — Addendum Note (Signed)
Addended by: Kathlyn [place] on: 08/16/2017 10:09 AM   Modules accepted: Orders

## 2017-08-18 ENCOUNTER — Telehealth: Payer: Self-pay

## 2017-08-18 DIAGNOSIS — K5090 Crohn's disease, unspecified, without complications: Secondary | ICD-10-CM | POA: Diagnosis not present

## 2017-08-18 LAB — IRON AND TIBC
IRON: 48 ug/dL (ref 38–169)
Iron Saturation: 14 % — ABNORMAL LOW (ref 15–55)
Total Iron Binding Capacity: 337 ug/dL (ref 250–450)
UIBC: 289 ug/dL (ref 111–343)

## 2017-08-18 NOTE — Telephone Encounter (Signed)
Patient brought in his completed [facility] Connect for patient assistance. Completed our portion and faxed in forms.

## 2017-08-20 ENCOUNTER — Telehealth: Payer: Self-pay | Admitting: Gastroenterology

## 2017-08-25 NOTE — Telephone Encounter (Signed)
Called back to [place], infusion has always been and will continue to be at [facility].

## 2017-08-26 ENCOUNTER — Other Ambulatory Visit: Payer: Self-pay | Admitting: Emergency Medicine

## 2017-08-26 DIAGNOSIS — F329 Major depressive disorder, single episode, unspecified: Secondary | ICD-10-CM

## 2017-08-26 DIAGNOSIS — F419 Anxiety disorder, unspecified: Principal | ICD-10-CM

## 2017-08-26 DIAGNOSIS — F32A Depression, unspecified: Secondary | ICD-10-CM

## 2017-08-26 MED ORDER — SERTRALINE HCL 50 MG PO TABS: 50.0000 mg | ORAL_TABLET | Freq: Every day | ORAL | 1 refills | Status: AC

## 2017-08-27 NOTE — Telephone Encounter (Signed)
Lilly calling from [place] connect states pt has been approved for Patient assistance program and letters have been sent to [place], facility, and pharmacy. States for [place] to not use our own supply for patient he must only use medication sent in by pharmacy or it will not count towards usage correctly. Best call back # [phone] Case #[id number].

## 2017-09-03 DIAGNOSIS — D649 Anemia, unspecified: Secondary | ICD-10-CM | POA: Diagnosis not present

## 2017-09-03 DIAGNOSIS — K5090 Crohn's disease, unspecified, without complications: Secondary | ICD-10-CM | POA: Diagnosis not present

## 2017-09-03 DIAGNOSIS — Z79899 Other long term (current) drug therapy: Secondary | ICD-10-CM | POA: Diagnosis not present

## 2017-09-13 ENCOUNTER — Encounter: Payer: Self-pay | Admitting: Family Medicine

## 2017-09-14 ENCOUNTER — Inpatient Hospital Stay (HOSPITAL_COMMUNITY)
Admission: EM | Admit: 2017-09-14 | Discharge: 2017-09-18 | DRG: 385 | Disposition: A | Discharge status: Home / Self Care | Payer: Medicare Other | Attending: Internal Medicine | Admitting: Internal Medicine

## 2017-09-14 ENCOUNTER — Telehealth: Payer: Self-pay | Admitting: Gastroenterology

## 2017-09-14 ENCOUNTER — Emergency Department (HOSPITAL_COMMUNITY): Payer: Medicare Other

## 2017-09-14 ENCOUNTER — Encounter (HOSPITAL_COMMUNITY): Payer: Self-pay | Admitting: Emergency Medicine

## 2017-09-14 DIAGNOSIS — Z79899 Other long term (current) drug therapy: Secondary | ICD-10-CM | POA: Diagnosis not present

## 2017-09-14 DIAGNOSIS — K5080 Crohn's disease of both small and large intestine without complications: Principal | ICD-10-CM | POA: Diagnosis present

## 2017-09-14 DIAGNOSIS — D649 Anemia, unspecified: Secondary | ICD-10-CM | POA: Diagnosis not present

## 2017-09-14 DIAGNOSIS — E860 Dehydration: Secondary | ICD-10-CM | POA: Diagnosis not present

## 2017-09-14 DIAGNOSIS — E876 Hypokalemia: Secondary | ICD-10-CM | POA: Diagnosis not present

## 2017-09-14 DIAGNOSIS — N179 Acute kidney failure, unspecified: Secondary | ICD-10-CM | POA: Diagnosis present

## 2017-09-14 DIAGNOSIS — Z681 Body mass index (BMI) 19 or less, adult: Secondary | ICD-10-CM

## 2017-09-14 DIAGNOSIS — R110 Nausea: Secondary | ICD-10-CM | POA: Diagnosis not present

## 2017-09-14 DIAGNOSIS — Z87442 Personal history of urinary calculi: Secondary | ICD-10-CM

## 2017-09-14 DIAGNOSIS — K219 Gastro-esophageal reflux disease without esophagitis: Secondary | ICD-10-CM | POA: Diagnosis present

## 2017-09-14 DIAGNOSIS — Z888 Allergy status to other drugs, medicaments and biological substances status: Secondary | ICD-10-CM

## 2017-09-14 DIAGNOSIS — K50819 Crohn's disease of both small and large intestine with unspecified complications: Secondary | ICD-10-CM

## 2017-09-14 DIAGNOSIS — K523 Indeterminate colitis: Secondary | ICD-10-CM | POA: Diagnosis not present

## 2017-09-14 DIAGNOSIS — E43 Unspecified severe protein-calorie malnutrition: Secondary | ICD-10-CM | POA: Diagnosis present

## 2017-09-14 DIAGNOSIS — Z87891 Personal history of nicotine dependence: Secondary | ICD-10-CM

## 2017-09-14 DIAGNOSIS — F329 Major depressive disorder, single episode, unspecified: Secondary | ICD-10-CM | POA: Diagnosis present

## 2017-09-14 DIAGNOSIS — R109 Unspecified abdominal pain: Secondary | ICD-10-CM | POA: Diagnosis not present

## 2017-09-14 DIAGNOSIS — G8928 Other chronic postprocedural pain: Secondary | ICD-10-CM

## 2017-09-14 DIAGNOSIS — R0602 Shortness of breath: Secondary | ICD-10-CM | POA: Diagnosis not present

## 2017-09-14 DIAGNOSIS — K630 Abscess of intestine: Secondary | ICD-10-CM | POA: Diagnosis not present

## 2017-09-14 DIAGNOSIS — Z932 Ileostomy status: Secondary | ICD-10-CM

## 2017-09-14 DIAGNOSIS — K912 Postsurgical malabsorption, not elsewhere classified: Secondary | ICD-10-CM | POA: Diagnosis present

## 2017-09-14 DIAGNOSIS — E46 Unspecified protein-calorie malnutrition: Secondary | ICD-10-CM | POA: Diagnosis not present

## 2017-09-14 DIAGNOSIS — K5000 Crohn's disease of small intestine without complications: Secondary | ICD-10-CM | POA: Diagnosis not present

## 2017-09-14 DIAGNOSIS — Z880 Allergy status to penicillin: Secondary | ICD-10-CM | POA: Diagnosis not present

## 2017-09-14 DIAGNOSIS — R1084 Generalized abdominal pain: Secondary | ICD-10-CM | POA: Diagnosis not present

## 2017-09-14 DIAGNOSIS — R933 Abnormal findings on diagnostic imaging of other parts of digestive tract: Secondary | ICD-10-CM | POA: Diagnosis not present

## 2017-09-14 LAB — URINALYSIS, ROUTINE W REFLEX MICROSCOPIC
Bilirubin Urine: NEGATIVE
Glucose, UA: NEGATIVE mg/dL
Hgb urine dipstick: NEGATIVE
KETONES UR: NEGATIVE mg/dL
Leukocytes, UA: NEGATIVE
NITRITE: NEGATIVE
PROTEIN: NEGATIVE mg/dL
Specific Gravity, Urine: 1.008 (ref 1.005–1.030)
pH: 6 (ref 5.0–8.0)

## 2017-09-14 LAB — CBC WITH DIFFERENTIAL/PLATELET
BASOS PCT: 0 %
Basophils Absolute: 0 10*3/uL (ref 0.0–0.1)
EOS ABS: 0.1 10*3/uL (ref 0.0–0.7)
Eosinophils Relative: 1 %
HCT: 42.1 % (ref 39.0–52.0)
HEMOGLOBIN: 14.8 g/dL (ref 13.0–17.0)
Lymphocytes Relative: 21 %
Lymphs Abs: 2.2 10*3/uL (ref 0.7–4.0)
MCH: 28.8 pg (ref 26.0–34.0)
MCHC: 35.2 g/dL (ref 30.0–36.0)
MCV: 82.1 fL (ref 78.0–100.0)
MONOS PCT: 12 %
Monocytes Absolute: 1.2 10*3/uL — ABNORMAL HIGH (ref 0.1–1.0)
NEUTROS PCT: 66 %
Neutro Abs: 6.8 10*3/uL (ref 1.7–7.7)
PLATELETS: 354 10*3/uL (ref 150–400)
RBC: 5.13 MIL/uL (ref 4.22–5.81)
RDW: 20.4 % — AB (ref 11.5–15.5)
WBC: 10.2 10*3/uL (ref 4.0–10.5)

## 2017-09-14 LAB — COMPREHENSIVE METABOLIC PANEL
ALBUMIN: 3.1 g/dL — AB (ref 3.5–5.0)
ALT: 19 U/L (ref 17–63)
AST: 20 U/L (ref 15–41)
Alkaline Phosphatase: 95 U/L (ref 38–126)
Anion gap: 12 (ref 5–15)
BUN: 25 mg/dL — ABNORMAL HIGH (ref 6–20)
CALCIUM: 8.5 mg/dL — AB (ref 8.9–10.3)
CO2: 20 mmol/L — AB (ref 22–32)
CREATININE: 1.81 mg/dL — AB (ref 0.61–1.24)
Chloride: 97 mmol/L — ABNORMAL LOW (ref 101–111)
GFR, EST AFRICAN AMERICAN: 47 mL/min — AB (ref >60)
GFR, EST NON AFRICAN AMERICAN: 40 mL/min — AB (ref >60)
Glucose, Bld: 112 mg/dL — ABNORMAL HIGH (ref 65–99)
Potassium: 2.5 mmol/L — CL (ref 3.5–5.1)
Sodium: 129 mmol/L — ABNORMAL LOW (ref 135–145)
Total Bilirubin: 0.9 mg/dL (ref 0.3–1.2)
Total Protein: 7.1 g/dL (ref 6.5–8.1)

## 2017-09-14 LAB — I-STAT CG4 LACTIC ACID, ED
LACTIC ACID, VENOUS: 2.1 mmol/L — AB (ref 0.5–1.9)
Lactic Acid, Venous: 0.61 mmol/L (ref 0.5–1.9)

## 2017-09-14 LAB — I-STAT TROPONIN, ED: Troponin i, poc: 0 ng/mL (ref 0.00–0.08)

## 2017-09-14 LAB — MAGNESIUM: Magnesium: 1.5 mg/dL — ABNORMAL LOW (ref 1.7–2.4)

## 2017-09-14 MED ORDER — MORPHINE SULFATE (PF) 4 MG/ML IV SOLN
6.0000 mg | Freq: Once | INTRAVENOUS | Status: AC
  Administered 2017-09-14: 6 mg via INTRAVENOUS
  Filled 2017-09-14: qty 2

## 2017-09-14 MED ORDER — MAGNESIUM SULFATE 2 GM/50ML IV SOLN
2.0000 g | Freq: Once | INTRAVENOUS | Status: AC
  Administered 2017-09-15: 2 g via INTRAVENOUS
  Filled 2017-09-14: qty 50

## 2017-09-14 MED ORDER — ONDANSETRON HCL 4 MG/2ML IJ SOLN
4.0000 mg | Freq: Four times a day (QID) | INTRAMUSCULAR | Status: DC | PRN
  Administered 2017-09-14 – 2017-09-18 (×9): 4 mg via INTRAVENOUS
  Filled 2017-09-14 (×10): qty 2

## 2017-09-14 MED ORDER — ACETAMINOPHEN 650 MG RE SUPP: 650.0000 mg | Freq: Four times a day (QID) | RECTAL | Status: DC | PRN

## 2017-09-14 MED ORDER — MORPHINE SULFATE (PF) 2 MG/ML IV SOLN
2.0000 mg | INTRAVENOUS | Status: DC | PRN
  Administered 2017-09-14: 2 mg via INTRAVENOUS
  Filled 2017-09-14: qty 1

## 2017-09-14 MED ORDER — SODIUM CHLORIDE 0.9 % IV BOLUS (SEPSIS)
1000.0000 mL | Freq: Once | INTRAVENOUS | Status: AC
  Administered 2017-09-14: 1000 mL via INTRAVENOUS

## 2017-09-14 MED ORDER — POTASSIUM CHLORIDE CRYS ER 20 MEQ PO TBCR
60.0000 meq | EXTENDED_RELEASE_TABLET | Freq: Once | ORAL | Status: AC
  Administered 2017-09-14: 60 meq via ORAL
  Filled 2017-09-14: qty 3

## 2017-09-14 MED ORDER — ONDANSETRON HCL 4 MG/2ML IJ SOLN
4.0000 mg | Freq: Once | INTRAMUSCULAR | Status: AC
  Administered 2017-09-14: 4 mg via INTRAVENOUS
  Filled 2017-09-14: qty 2

## 2017-09-14 MED ORDER — ONDANSETRON HCL 4 MG PO TABS
4.0000 mg | ORAL_TABLET | Freq: Four times a day (QID) | ORAL | Status: DC | PRN
  Administered 2017-09-18: 4 mg via ORAL
  Filled 2017-09-14: qty 1

## 2017-09-14 MED ORDER — IOPAMIDOL (ISOVUE-300) INJECTION 61%
INTRAVENOUS | Status: AC
  Filled 2017-09-14: qty 30

## 2017-09-14 MED ORDER — IOPAMIDOL (ISOVUE-300) INJECTION 61%
30.0000 mL | Freq: Once | INTRAVENOUS | Status: AC | PRN
  Administered 2017-09-14: 30 mL via ORAL

## 2017-09-14 MED ORDER — POTASSIUM CHLORIDE 10 MEQ/100ML IV SOLN
10.0000 meq | INTRAVENOUS | Status: AC
  Administered 2017-09-14 (×2): 10 meq via INTRAVENOUS
  Filled 2017-09-14 (×2): qty 100

## 2017-09-14 MED ORDER — ACETAMINOPHEN 325 MG PO TABS: 650.0000 mg | ORAL_TABLET | Freq: Four times a day (QID) | ORAL | Status: DC | PRN

## 2017-09-14 MED ORDER — MAGNESIUM OXIDE 400 (241.3 MG) MG PO TABS
400.0000 mg | ORAL_TABLET | Freq: Once | ORAL | Status: AC
  Administered 2017-09-14: 400 mg via ORAL
  Filled 2017-09-14: qty 1

## 2017-09-14 MED ORDER — ENOXAPARIN SODIUM 40 MG/0.4ML ~~LOC~~ SOLN
40.0000 mg | Freq: Every day | SUBCUTANEOUS | Status: DC
  Administered 2017-09-15 – 2017-09-18 (×4): 40 mg via SUBCUTANEOUS
  Filled 2017-09-14 (×4): qty 0.4

## 2017-09-14 MED ORDER — POTASSIUM CHLORIDE IN NACL 20-0.9 MEQ/L-% IV SOLN
INTRAVENOUS | Status: DC
  Administered 2017-09-14 – 2017-09-17 (×5): via INTRAVENOUS
  Filled 2017-09-14 (×8): qty 1000

## 2017-09-14 MED ORDER — ALBUTEROL SULFATE (2.5 MG/3ML) 0.083% IN NEBU: 2.5000 mg | INHALATION_SOLUTION | Freq: Four times a day (QID) | RESPIRATORY_TRACT | Status: DC | PRN

## 2017-09-14 NOTE — H&P (Addendum)
History and Physical    Eldin Bonsell. HUD:[id number] DOB: 05-19-1961 DOA: 09/14/2017  Referring MD/NP/PA: Dr. Sherwood Gambler PCP: Billie Ruddy, MD  Patient coming from: Home  Chief Complaint:  weakness  I have personally briefly reviewed patient's old medical records in [place]   HPI: Marcial Pless. is a 57 y.o. male with medical history significant of Crohn's disease s/p colectomy and ileostomy, endocarditis, nephrolithiasis, and GERD; who presents with complaints of generalized weakness over the last 2 weeks.  He initially thought he had come down with the stomach flu.  Complains of generalized achy abdominal pain.  Associated symptoms included nausea, dry heaves, decreased appetite, mild shortness of breath, generalized myalgias, joint pain, and diarrhea.  Notes having anywhere from 3-5 bowel movements per day that are nonbloody.  Denies any recent antibiotics, fever, chills, dysuria, cough, falls, or loss of consciousness.  He reports discussing symptoms with his PCP, but was ultimately recommended to follow-up with his gastroenterologist who recommended him to come to the emergency department today after hearing symptoms.  He was started on Entyvio infusions about 6 months ago and his last infusion on 12/19.  He is followed by Dr. Nicki Guadalajara of gastroenterology.    ED Course: Upon admission into the emergency department patient was noted to be afebrile, pulse 69-107, and all other vital signs relatively within normal limits.  Labs revealed WBC 10.2, sodium 129, potassium 2.9, BUN 25, creatinine 1.81, magnesium 1.5, calcium 8.5, lactic acid 2.1--> 0.61.  Urinalysis was negative for any signs of infection.  Chest x-ray was otherwise noted to be clear.  Patient was given 2 L of normal saline IV fluids, potassium chloride 60 mEq p.o., potassium chloride 20 mEq IV, 400 mg of magnesium oxide p.o., 6 mg of morphine IV, and 1 dose of Zofran.  CT scan of the abdomen  showed thickened loops of bowel with mild associated mesenteric inflammation suggestive of active Crohn's disease.  TRH called to admit.  Review of Systems  Constitutional: Positive for malaise/fatigue. Negative for chills.  HENT: Negative for ear discharge and nosebleeds.   Eyes: Negative for photophobia and pain.  Respiratory: Positive for shortness of breath. Negative for cough.   Cardiovascular: Negative for chest pain and leg swelling.  Gastrointestinal: Positive for diarrhea, nausea and vomiting (Dry heaves).  Genitourinary: Negative for dysuria and hematuria.  Musculoskeletal: Positive for joint pain and myalgias.  Skin: Positive for itching and rash.  Neurological: Positive for weakness. Negative for speech change and focal weakness.  Psychiatric/Behavioral: Negative for substance abuse. The patient is not nervous/anxious.     Past Medical History:  Diagnosis Date  . Acute endocarditis   . Crohn's disease ([place])   . Depression   . Eating disorder   . GERD (gastroesophageal reflux disease)   . Kidney stones   . Short gut syndrome     Past Surgical History:  Procedure Laterality Date  . CYSTOSCOPY WITH STENT PLACEMENT Left 05/20/2017   Procedure: CYSTOSCOPY WITH STENT PLACEMENT;  Surgeon: [name], MD;  Location: [facility];  Service: Urology;  Laterality: Left;  . ILEOSTOMY    . IR NEPHROSTOMY EXCHANGE LEFT  04/12/2017  . IR NEPHROSTOMY PLACEMENT LEFT  04/12/2017  . IR NEPHROSTOMY PLACEMENT LEFT  04/29/2017  . NEPHROLITHOTOMY Left 05/14/2017   Procedure: LEFT NEPHROLITHOTOMY PERCUTANEOUS WITH SURGEON ACCESS;  Surgeon: [name], MD;  Location: [facility];  Service: Urology;  Laterality: Left;  . SMALL INTESTINE SURGERY    .  SUBTOTAL COLECTOMY       reports that he quit smoking about 11 years ago. His smoking use included cigarettes. He has a 20.00 pack-year smoking history. he has never used smokeless tobacco. He reports that he does not drink alcohol or use  drugs.  Allergies  Allergen Reactions  . Methotrexate Derivatives Other (See Comments)    Anemia, low WBC, severe GI symptoms,   . Humira [Adalimumab] Other (See Comments)    Intolerance  . Penicillins Other (See Comments)    REACTION DURING SURGERY Has patient had a PCN reaction causing immediate rash, facial/tongue/throat swelling, SOB or lightheadedness with hypotension: Unknown Has patient had a PCN reaction causing severe rash involving mucus membranes or skin necrosis: Unknown Has patient had a PCN reaction that required hospitalization: Unknown Has patient had a PCN reaction occurring within the last 10 years: No If all of the above answers are "NO", then may proceed with Cephalosporin use.   . Remicade [Infliximab] Other (See Comments)    Intolerance    Family History  Problem Relation Age of Onset  . Heart disease Mother   . Diabetes Father   . Heart disease Father   . Heart attack Father   . Melanoma Sister   . Crohn's disease Brother   . Inflammatory bowel disease Brother   . Diabetes Paternal Grandmother     Prior to Admission medications   Medication Sig Start Date End Date Taking? Authorizing Provider  cholecalciferol (VITAMIN D) 400 units TABS tablet Take 400 Units by mouth daily.   Yes [provider]  diphenoxylate-atropine (LOMOTIL) 2.5-0.025 MG tablet Take 1 Tablet by mouth 4 times a day 06/28/17  Yes Armbruster, Carlota Raspberry, MD  escitalopram (LEXAPRO) 10 MG tablet Take 10 mg by mouth daily.   Yes [provider]  ferrous sulfate 325 (65 FE) MG tablet Take 1 tablet (325 mg total) 2 (two) times daily with a meal by mouth. 07/12/17  Yes Armbruster, Carlota Raspberry, MD  folic acid (FOLVITE) 1 MG tablet Take 1 Tablet by mouth once daily 06/01/17  Yes Armbruster, Carlota Raspberry, MD  ipratropium (ATROVENT) 0.03 % nasal spray Place 2 sprays 2 (two) times daily into both nostrils. Can use 3 times a day as needed 07/12/17  Yes Armbruster, Carlota Raspberry, MD  Multiple  Vitamin (MULTIVITAMIN WITH MINERALS) TABS tablet Take 1 tablet by mouth daily. 05/03/17  Yes Rai, Ripudeep K, MD  ondansetron (ZOFRAN-ODT) 4 MG disintegrating tablet Take 1 tablet (4 mg total) by mouth every 8 (eight) hours as needed for nausea or vomiting. 05/02/17  Yes Rai, Ripudeep K, MD  potassium chloride SA (K-DUR,KLOR-CON) 20 MEQ tablet Take 1 tablet (20 mEq total) by mouth as directed. Patient taking differently: Take 20 mEq by mouth daily.  04/02/17  Yes Armbruster, Carlota Raspberry, MD  ranitidine (ZANTAC) 150 MG tablet Take 1 tablet (150 mg total) by mouth 2 (two) times daily. 03/09/17  Yes Armbruster, Carlota Raspberry, MD  sertraline (ZOLOFT) 50 MG tablet Take 1 tablet (50 mg total) by mouth daily. 08/26/17  Yes Billie Ruddy, MD  traMADol (ULTRAM) 50 MG tablet Take 1 tablet (50 mg total) by mouth every 8 (eight) hours as needed. 07/29/17  Yes Billie Ruddy, MD  Vedolizumab (ENTYVIO IV) Inject into the vein.   Yes [provider]  midodrine (PROAMATINE) 10 MG tablet Take 1 tablet (10 mg total) by mouth 2 (two) times daily with a meal. Patient not taking: Reported on 09/14/2017 05/02/17  Rai, Vernelle Emerald, MD    Physical Exam:  Constitutional: Cachectic and sick appearing older male who is able to follow commands Vitals:   09/14/17 1632 09/14/17 1732 09/14/17 1800 09/14/17 1943  BP: 116/81 114/72 101/80 104/79  Pulse: 91 71 71 69  Resp: 16 16 13 14   Temp:      TempSrc:      SpO2: 100% 99% 100% 100%   Eyes: PERRL, lids and conjunctivae normal ENMT: Mucous membranes are dry. Posterior pharynx clear of any exudate or lesions.   Neck: normal, supple, no masses, no thyromegaly Respiratory: clear to auscultation bilaterally, no wheezing, no crackles. Normal respiratory effort. No accessory muscle use.  Cardiovascular: Regular rate and rhythm, no murmurs / rubs / gallops. No extremity edema. 2+ pedal pulses. No carotid bruits.  Abdomen: no tenderness, no masses palpated. No hepatosplenomegaly.  Bowel sounds positive.  Ostomy present. Musculoskeletal: no clubbing / cyanosis. No joint deformity upper and lower extremities. Good ROM, no contractures. Normal muscle tone.  Skin: Nonspecific rash noted at the right upper extremity. Neurologic: CN 2-12 grossly intact. Sensation intact, DTR normal. Strength 5/5 in all 4.  Psychiatric: Normal judgment and insight. Alert and oriented x 3. Normal mood.     Labs on Admission: I have personally reviewed following labs and imaging studies  CBC: Recent Labs  Lab 09/14/17 1402  WBC 10.2  NEUTROABS 6.8  HGB 14.8  HCT 42.1  MCV 82.1  PLT 354   Basic Metabolic Panel: Recent Labs  Lab 09/14/17 1402  NA 129*  K 2.5*  CL 97*  CO2 20*  GLUCOSE 112*  BUN 25*  CREATININE 1.81*  CALCIUM 8.5*  MG 1.5*   GFR: CrCl cannot be calculated (Unknown ideal weight.). Liver Function Tests: Recent Labs  Lab 09/14/17 1402  AST 20  ALT 19  ALKPHOS 95  BILITOT 0.9  PROT 7.1  ALBUMIN 3.1*   No results for input(s): LIPASE, AMYLASE in the last 168 hours. No results for input(s): AMMONIA in the last 168 hours. Coagulation Profile: No results for input(s): INR, PROTIME in the last 168 hours. Cardiac Enzymes: No results for input(s): CKTOTAL, CKMB, CKMBINDEX, TROPONINI in the last 168 hours. BNP (last 3 results) No results for input(s): PROBNP in the last 8760 hours. HbA1C: No results for input(s): HGBA1C in the last 72 hours. CBG: No results for input(s): GLUCAP in the last 168 hours. Lipid Profile: No results for input(s): CHOL, HDL, LDLCALC, TRIG, CHOLHDL, LDLDIRECT in the last 72 hours. Thyroid Function Tests: No results for input(s): TSH, T4TOTAL, FREET4, T3FREE, THYROIDAB in the last 72 hours. Anemia Panel: No results for input(s): VITAMINB12, FOLATE, FERRITIN, TIBC, IRON, RETICCTPCT in the last 72 hours. Urine analysis:    Component Value Date/Time   COLORURINE YELLOW 09/14/2017 1855   APPEARANCEUR CLEAR 09/14/2017 1855    LABSPEC 1.008 09/14/2017 1855   PHURINE 6.0 09/14/2017 1855   GLUCOSEU NEGATIVE 09/14/2017 1855   HGBUR NEGATIVE 09/14/2017 1855   BILIRUBINUR NEGATIVE 09/14/2017 1855   KETONESUR NEGATIVE 09/14/2017 1855   PROTEINUR NEGATIVE 09/14/2017 1855   NITRITE NEGATIVE 09/14/2017 1855   LEUKOCYTESUR NEGATIVE 09/14/2017 1855   Sepsis Labs: No results found for this or any previous visit (from the past 240 hour(s)).   Radiological Exams on Admission: Ct Abdomen Pelvis Wo Contrast  Result Date: 09/14/2017 CLINICAL DATA:  Patient presents c/o weakness and having no energy. Patient concerned his hemoglobin could be low. C/o abdominal pain and nausea. Pt presents lethargic. Hx of  crohn's and has colostomy bag. Denies chest pain. EXAM: CT ABDOMEN AND PELVIS WITHOUT CONTRAST TECHNIQUE: Multidetector CT imaging of the abdomen and pelvis was performed following the standard protocol without IV contrast. COMPARISON:  05/19/2017 FINDINGS: Lower chest: Heart is normal size. There are emphysematous blebs in the anterior lung bases. Mild dependent subsegmental atelectasis is noted in the left lower lobe. No acute findings. Hepatobiliary: Liver is unremarkable. Gallbladder not visualize, presumed surgically absent. Common bile duct measures a maximum of 9 mm in diameter with distal tapering, unchanged from the prior CT. Pancreas: Unremarkable. No pancreatic ductal dilatation or surrounding inflammatory changes. Spleen: Normal in size without focal abnormality. Adrenals/Urinary Tract: Adrenal glands are unremarkable. Kidneys are normal, without renal calculi, focal lesion, or hydronephrosis. Bladder is unremarkable. Stomach/Bowel: In the left mid to upper abdomen, there is a dilated bowel loop which appears to be the colon, dilated to 6.4 cm. There is an associated anastomosis staple line. This connects to a prominent loop of thick walled bowel, that is likely ileum, which extends into the pelvis and from there into the left  mid to upper abdomen. Wall measures up to 9 mm in thickness. There is mild associated hazy opacity in the mesentery and several prominent to mildly enlarged mesenteric lymph nodes, largest measuring 1 cm short axis. No other bowel inflammation. An anastomosis staple line is noted at the rectosigmoid junction. Vascular/Lymphatic: No other prominent or enlarged lymph nodes. No significant vascular abnormality. Reproductive: Unremarkable. Other: Stable appearance of the left lower quadrant colostomy. No ascites. Musculoskeletal: No fracture or acute finding. No osteoblastic or osteolytic lesions. Disk degenerative changes at L5-S1. IMPRESSION: 1. Thick walled loop of bowel with mild associated mesenteric inflammation. This appears to be ileum, which extends from the left mid abdomen anastomosis into the pelvis and right lower quadrant. This was present to a similar degree on the prior CT. Active Crohn's disease is suspected. 2. No other acute abnormalities. No evidence of an abscess or fistula. Electronically Signed   By: Lajean Manes M.D.   On: 09/14/2017 19:29   Dg Chest 2 View  Result Date: 09/14/2017 CLINICAL DATA:  Shortness of breath EXAM: CHEST  2 VIEW COMPARISON:  None FINDINGS: The lungs are hyperinflated likely secondary to COPD. There is no focal consolidation. There is no pleural effusion or pneumothorax. The heart and mediastinal contours are unremarkable. The osseous structures are unremarkable. IMPRESSION: No active cardiopulmonary disease. Emphysema (ICD10-J43.9). Electronically Signed   By: Kathreen Devoid   On: 09/14/2017 14:35    EKG: Independently reviewed.  Sinus rhythm with diffuse ST wave changes noted and PVC  Assessment/Plan Crohn's disease flare: Acute.  Patient presents with generalized abdominal pain with decreased overall appetite and nausea.  CT scan showing thickened loops of bowel with mesenteric inflammation suggestive of Crohn's flare.  Patient on IV infusions of Entyvio last  12/19. Gastroenterology consulted and recommended holding on steroids at this time. - Admit to a telemetry bed - Check C. difficile panel - Clear liquid diet as tolerated - Morphine IV prn pain - Appreciate consultative services of [facility], will follow-up for further recommendations  Acute kidney injury: Patient's baseline creatinine previously noted to be 1, but presents with a creatinine of 1.81 and BUN 25.  Given history of poor oral intake suspect dehydration as a likely cause of symptoms.  Imaging did not show any acute signs of obstruction. - Monitor ins and outs - IV fluids normal saline w/ 20 meq at 197m/hr as tolerated -  Recheck creatinine in a.m.  Nausea and vomiting: - Antiemetics as needed  Electrolyte abnormalities( hypomagnesia, hypokalemia, hyponatremia): Patient given replacement with initial electrolytes in ED. - Continue to monitor and replace as needed  DVT prophylaxis: Lovenox Code Status: Full Family Communication: No family present at bedside Disposition Plan: To be determined Consults called: Gastroenterology Admission status: Inpatient  Norval Morton MD [facility] Pager [phone]   If 7PM-7AM, please contact night-coverage [email or web address] Password [facility]  09/14/2017, 8:08 PM

## 2017-09-14 NOTE — ED Provider Notes (Signed)
Patient placed in Quick Look pathway, seen and evaluated   Chief Complaint: generalized weakness, nausea, abdominal pain, diarrhea  HPI:   Reports 1 day history of feelings of dehydration, nausea, decreased PO intake. Has been falling due to weakness. Denies head injury or LOC. Reports intermittent SOB, diarrhea, abdominal pain. Unsure if this feels like Crohn's flare. History of similar symptoms in the past. No changes to ostomy output, usually changes 5x/day. Reports compliance with home Crohn's medications.  ROS: abdominal pain, nausea  Physical Exam:   Gen: No distress  Neuro: Awake and Alert  Skin: Warm    Focused Exam: tachycardic; appears dehydrated, but able to answer in complete sentences. Does appear chronically ill. Ostomy bag in place with no surrounding abnormalities. Dry heaving during this examination.   Initiation of care has begun. The patient has been counseled on the process, plan, and necessity for staying for the completion/evaluation, and the remainder of the medical screening examination   Delia Heady, PA-C 09/14/17 1409    Virgel Manifold, MD 09/14/17 1630

## 2017-09-14 NOTE — Telephone Encounter (Signed)
I'm sorry to hear this. He has a complicated history, not sure if Crohn's or something else is causing his symptoms, but given what he is reporting I agree he needs to go to the ER for urgent evaluation.

## 2017-09-14 NOTE — ED Triage Notes (Addendum)
Patient presents c/o weakness and having no energy. Patient concerned his hemoglobin could be low. C/o abdominal pain and nausea. Pt presents lethargic. Hx of crohn's and has colostomy bag. Denies chest pain.

## 2017-09-14 NOTE — ED Provider Notes (Signed)
Patient care transferred to me.  CT shows likely ileitis consistent with Crohn's flare.  I discussed with hospitalist, Dr. Tamala Julian who will admit.  Discussed with gastroenterology, Dr. Loletha Carrow, who asked for patient to not be put on steroids at this time and gastroenterology will evaluate tomorrow.  Continue repleting electrolytes   Sherwood Gambler, MD 09/14/17 2252

## 2017-09-14 NOTE — Telephone Encounter (Signed)
Patient states he cannot eat, drinking very little, having severe abdominal pain. Last Entyvio infusion is 12/19. Thought he had the flu for a week, had contacted his PCP. He cannot get out of bed, states he is so weak, he takes 3 steps and falls down. His sister is on her way to his home, advised to go to ED for evaluation.

## 2017-09-14 NOTE — ED Provider Notes (Signed)
[name] DEPT Provider Note   CSN: [id number] Arrival date & time: 09/14/17  1354     History   Chief Complaint Chief Complaint  Patient presents with  . Weakness    HPI Gregory [name]. is a 57 y.o. male.  HPI   57 year old male with progressive generalized weakness over the past 1-2 weeks.  He has a past history of Crohn's diagnosed in the 57s.  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  He has been having anorexia and increasing abdominal pain.  He is concerned that he may be having a flare of his Crohn's.  He chronically has loose stools.  Output has been decreased over the past few days.  No urinary complaints.  No fevers or chills.  Past Medical History:  Diagnosis Date  . Acute endocarditis   . Crohn's disease ([place])   . Depression   . Eating disorder   . GERD (gastroesophageal reflux disease)   . Kidney stones   . Short gut syndrome     Patient Active Problem List   Diagnosis Date Noted  . Anemia 05/18/2017  . Protein-calorie malnutrition, severe 04/30/2017  . Anuresis 04/28/2017  . Physical deconditioning 04/28/2017  . Protein calorie malnutrition ([place]) 04/28/2017  . Abdominal pain, left upper quadrant 04/28/2017  . GERD (gastroesophageal reflux disease) 04/28/2017  . Nephrostomy complication ([place]) [id number]  . Chronic pain 04/28/2017  . Depression 04/28/2017  . Nausea & vomiting   . Crohn's disease of small and large intestines with complication ([place])   . Nephrolithiasis 04/10/2017    Past Surgical History:  Procedure Laterality Date  . CYSTOSCOPY WITH STENT PLACEMENT Left 05/20/2017   Procedure: CYSTOSCOPY WITH STENT PLACEMENT;  Surgeon: [name], MD;  Location: [facility] ORS;  Service: Urology;  Laterality: Left;  . ILEOSTOMY    . IR NEPHROSTOMY EXCHANGE LEFT  04/12/2017  . IR NEPHROSTOMY PLACEMENT LEFT  04/12/2017  . IR NEPHROSTOMY PLACEMENT LEFT  04/29/2017  . NEPHROLITHOTOMY Left  05/14/2017   Procedure: LEFT NEPHROLITHOTOMY PERCUTANEOUS WITH SURGEON ACCESS;  Surgeon: [name], MD;  Location: [facility];  Service: Urology;  Laterality: Left;  . SMALL INTESTINE SURGERY    . SUBTOTAL COLECTOMY         Home Medications    Prior to Admission medications   Medication Sig Start Date End Date Taking? Authorizing Provider  cholecalciferol (VITAMIN D) 400 units TABS tablet Take 400 Units by mouth daily.   Yes [provider]  diphenoxylate-atropine (LOMOTIL) 2.5-0.025 MG tablet Take 1 Tablet by mouth 4 times a day 06/28/17  Yes Armbruster, Carlota Raspberry, MD  escitalopram (LEXAPRO) 10 MG tablet Take 10 mg by mouth daily.   Yes [provider]  ferrous sulfate 325 (65 FE) MG tablet Take 1 tablet (325 mg total) 2 (two) times daily with a meal by mouth. 07/12/17  Yes Armbruster, Carlota Raspberry, MD  folic acid (FOLVITE) 1 MG tablet Take 1 Tablet by mouth once daily 06/01/17  Yes Armbruster, Carlota Raspberry, MD  ipratropium (ATROVENT) 0.03 % nasal spray Place 2 sprays 2 (two) times daily into both nostrils. Can use 3 times a day as needed 07/12/17  Yes Armbruster, Carlota Raspberry, MD  Multiple Vitamin (MULTIVITAMIN WITH MINERALS) TABS tablet Take 1 tablet by mouth daily. 05/03/17  Yes Rai, Ripudeep K, MD  ondansetron (ZOFRAN-ODT) 4 MG disintegrating tablet Take 1 tablet (4 mg total) by mouth every 8 (eight) hours as needed for nausea or vomiting.  05/02/17  Yes Rai, Ripudeep K, MD  potassium chloride SA (K-DUR,KLOR-CON) 20 MEQ tablet Take 1 tablet (20 mEq total) by mouth as directed. Patient taking differently: Take 20 mEq by mouth daily.  04/02/17  Yes Armbruster, Carlota Raspberry, MD  ranitidine (ZANTAC) 150 MG tablet Take 1 tablet (150 mg total) by mouth 2 (two) times daily. 03/09/17  Yes Armbruster, Carlota Raspberry, MD  sertraline (ZOLOFT) 50 MG tablet Take 1 tablet (50 mg total) by mouth daily. 08/26/17  Yes Billie Ruddy, MD  traMADol (ULTRAM) 50 MG tablet Take 1 tablet (50 mg total) by mouth every  8 (eight) hours as needed. 07/29/17  Yes Billie Ruddy, MD  Vedolizumab (ENTYVIO IV) Inject into the vein.   Yes [provider]  midodrine (PROAMATINE) 10 MG tablet Take 1 tablet (10 mg total) by mouth 2 (two) times daily with a meal. Patient not taking: Reported on 09/14/2017 05/02/17   Mendel Corning, MD    Family History Family History  Problem Relation Age of Onset  . Heart disease Mother   . Diabetes Father   . Heart disease Father   . Heart attack Father   . Melanoma Sister   . Crohn's disease Brother   . Inflammatory bowel disease Brother   . Diabetes Paternal Grandmother     Social History Social History   Tobacco Use  . Smoking status: Former Smoker    Packs/day: 1.00    Years: 20.00    Pack years: 20.00    Types: Cigarettes    Last attempt to quit: 08/31/2006    Years since quitting: 11.0  . Smokeless tobacco: Never Used  Substance Use Topics  . Alcohol use: No  . Drug use: No     Allergies   Methotrexate derivatives; Humira [adalimumab]; Penicillins; and Remicade [infliximab]   Review of Systems Review of Systems  All systems reviewed and negative, other than as noted in HPI.  Physical Exam Updated Vital Signs BP 91/75   Pulse 82   Temp (!) 97.4 F (36.3 C) (Oral)   Resp 16   SpO2 99%   Physical Exam  Constitutional: No distress.  Frail. Chronically ill appearing.   HENT:  Head: Normocephalic and atraumatic.  Eyes: Conjunctivae are normal. Right eye exhibits no discharge. Left eye exhibits no discharge.  Neck: Neck supple.  Cardiovascular: Normal rate, regular rhythm and normal heart sounds. Exam reveals no gallop and no friction rub.  No murmur heard. Pulmonary/Chest: Effort normal and breath sounds normal. No respiratory distress.  Abdominal: Soft. He exhibits no distension. There is no tenderness.  Abdomen flat.  Multiple surgical scars noted.  Ostomy without significant stool in the bag.  Mild diffuse tenderness but no  focality.  Musculoskeletal: He exhibits no edema or tenderness.  Neurological: He is alert.  Skin: Skin is warm and dry.  Psychiatric: He has a normal mood and affect. His behavior is normal. Thought content normal.  Nursing note and vitals reviewed.    ED Treatments / Results  Labs (all labs ordered are listed, but only abnormal results are displayed) Labs Reviewed  COMPREHENSIVE METABOLIC PANEL - Abnormal; Notable for the following components:      Result Value   Sodium 129 (*)    Potassium 2.5 (*)    Chloride 97 (*)    CO2 20 (*)    Glucose, Bld 112 (*)    BUN 25 (*)    Creatinine, Ser 1.81 (*)    Calcium 8.5 (*)  Albumin 3.1 (*)    GFR calc non Af Amer 40 (*)    GFR calc Af Amer 47 (*)    All other components within normal limits  CBC WITH DIFFERENTIAL/PLATELET - Abnormal; Notable for the following components:   RDW 20.4 (*)    Monocytes Absolute 1.2 (*)    All other components within normal limits  I-STAT CG4 LACTIC ACID, ED - Abnormal; Notable for the following components:   Lactic Acid, Venous 2.10 (*)    All other components within normal limits  URINALYSIS, ROUTINE W REFLEX MICROSCOPIC  MAGNESIUM  I-STAT TROPONIN, ED  I-STAT CG4 LACTIC ACID, ED    EKG  EKG Interpretation None       Radiology Dg Chest 2 View  Result Date: 09/14/2017 CLINICAL DATA:  Shortness of breath EXAM: CHEST  2 VIEW COMPARISON:  None FINDINGS: The lungs are hyperinflated likely secondary to COPD. There is no focal consolidation. There is no pleural effusion or pneumothorax. The heart and mediastinal contours are unremarkable. The osseous structures are unremarkable. IMPRESSION: No active cardiopulmonary disease. Emphysema (ICD10-J43.9). Electronically Signed   By: Kathreen Devoid   On: 09/14/2017 14:35    Procedures Procedures (including critical care time)  Medications Ordered in ED Medications  ondansetron (ZOFRAN) injection 4 mg (4 mg Intravenous Given 09/14/17 1505)  sodium  chloride 0.9 % bolus 1,000 mL (1,000 mLs Intravenous New Bag/Given 09/14/17 1505)  sodium chloride 0.9 % bolus 1,000 mL (1,000 mLs Intravenous New Bag/Given 09/14/17 1505)  morphine 4 MG/ML injection 6 mg (6 mg Intravenous Given 09/14/17 1514)     Initial Impression / Assessment and Plan / ED Course  I have reviewed the triage vital signs and the nursing notes.  Pertinent labs & imaging results that were available during my care of the patient were reviewed by me and considered in my medical decision making (see chart for details).     57 year old male with generalized fatigue, abdominal pain, nausea.  Acutely dehydrated.  Creatinine is double his baseline.  Hypokalemic at 2.5.  Will supplement.  Will check a mag level.  Will need admitted for ongoing hydration and electrolyte management.  CT the abdomen pelvis is pending.  Blood pressure chronically runs in 58P-929 systolic.  Final Clinical Impressions(s) / ED Diagnoses   Final diagnoses:  Dehydration  Hypokalemia    ED Discharge Orders    None       Virgel Manifold, MD 09/15/17 [phone]

## 2017-09-14 NOTE — ED Notes (Signed)
Pt c/o nausea, will speak with MD

## 2017-09-15 ENCOUNTER — Other Ambulatory Visit: Payer: Self-pay

## 2017-09-15 DIAGNOSIS — R110 Nausea: Secondary | ICD-10-CM

## 2017-09-15 DIAGNOSIS — N179 Acute kidney failure, unspecified: Secondary | ICD-10-CM | POA: Diagnosis present

## 2017-09-15 DIAGNOSIS — K50819 Crohn's disease of both small and large intestine with unspecified complications: Secondary | ICD-10-CM

## 2017-09-15 DIAGNOSIS — R933 Abnormal findings on diagnostic imaging of other parts of digestive tract: Secondary | ICD-10-CM

## 2017-09-15 DIAGNOSIS — R1084 Generalized abdominal pain: Secondary | ICD-10-CM

## 2017-09-15 DIAGNOSIS — E876 Hypokalemia: Secondary | ICD-10-CM | POA: Diagnosis present

## 2017-09-15 DIAGNOSIS — K630 Abscess of intestine: Secondary | ICD-10-CM

## 2017-09-15 LAB — C DIFFICILE QUICK SCREEN W PCR REFLEX
C DIFFICILE (CDIFF) TOXIN: NEGATIVE
C Diff antigen: NEGATIVE
C Diff interpretation: NOT DETECTED

## 2017-09-15 LAB — CBC
HCT: 36.9 % — ABNORMAL LOW (ref 39.0–52.0)
Hemoglobin: 12.5 g/dL — ABNORMAL LOW (ref 13.0–17.0)
MCH: 28.3 pg (ref 26.0–34.0)
MCHC: 33.9 g/dL (ref 30.0–36.0)
MCV: 83.5 fL (ref 78.0–100.0)
Platelets: 308 10*3/uL (ref 150–400)
RBC: 4.42 MIL/uL (ref 4.22–5.81)
RDW: 20.7 % — AB (ref 11.5–15.5)
WBC: 7.8 10*3/uL (ref 4.0–10.5)

## 2017-09-15 LAB — BASIC METABOLIC PANEL
ANION GAP: 5 (ref 5–15)
BUN: 15 mg/dL (ref 6–20)
CALCIUM: 8.1 mg/dL — AB (ref 8.9–10.3)
CO2: 20 mmol/L — ABNORMAL LOW (ref 22–32)
Chloride: 110 mmol/L (ref 101–111)
Creatinine, Ser: 1.16 mg/dL (ref 0.61–1.24)
GFR calc Af Amer: >60 mL/min (ref >60)
GFR calc non Af Amer: >60 mL/min (ref >60)
GLUCOSE: 111 mg/dL — AB (ref 65–99)
POTASSIUM: 3.3 mmol/L — AB (ref 3.5–5.1)
SODIUM: 135 mmol/L (ref 135–145)

## 2017-09-15 LAB — MAGNESIUM: MAGNESIUM: 2.4 mg/dL (ref 1.7–2.4)

## 2017-09-15 LAB — C-REACTIVE PROTEIN: CRP: <0.8 mg/dL (ref <1.0)

## 2017-09-15 LAB — SEDIMENTATION RATE: Sed Rate: 17 mm/hr — ABNORMAL HIGH (ref 0–16)

## 2017-09-15 MED ORDER — PREDNISONE 20 MG PO TABS
40.0000 mg | ORAL_TABLET | Freq: Every day | ORAL | Status: DC
  Administered 2017-09-15 – 2017-09-18 (×4): 40 mg via ORAL
  Filled 2017-09-15 (×4): qty 2

## 2017-09-15 MED ORDER — FAMOTIDINE 20 MG PO TABS
20.0000 mg | ORAL_TABLET | Freq: Every day | ORAL | Status: DC
  Administered 2017-09-15 – 2017-09-18 (×4): 20 mg via ORAL
  Filled 2017-09-15 (×4): qty 1

## 2017-09-15 MED ORDER — MORPHINE SULFATE (PF) 4 MG/ML IV SOLN
2.0000 mg | INTRAVENOUS | Status: DC | PRN
  Administered 2017-09-15 – 2017-09-16 (×3): 4 mg via INTRAVENOUS
  Administered 2017-09-16 – 2017-09-18 (×6): 2 mg via INTRAVENOUS
  Filled 2017-09-15 (×9): qty 1

## 2017-09-15 MED ORDER — SODIUM CHLORIDE 0.9 % IV SOLN
1.0000 g | Freq: Once | INTRAVENOUS | Status: AC
  Administered 2017-09-15: 1 g via INTRAVENOUS
  Filled 2017-09-15: qty 10

## 2017-09-15 NOTE — Consult Note (Signed)
[facility] Consult: 8:57 AM 09/15/2017  LOS: 1 day    Referring Provider: Dr Sloan Leiter  Primary Care Physician:  Billie Ruddy, MD Primary Gastroenterologist:  Dr Havery Moros as of 01/2017.  Previously lived in [place] and treated there.      Reason for Consultation:  Ileitis?    HPI: Gregory [name]. is a 57 y.o. male.  PMH Crohn's:severe penetrating/abscessing/stricturing small and large bowel disease.  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 Has ileostomy and remnant rectal pouch.  Ileostomy reversal unsuccessful in past (too much diarrhea).   Multiple hospitalizations, last ~ 2016 for dehydration. Allergies to 6MP (reaction unknown), Remicade (hives), Humira (hives), methotrexate (stopped 03/2017 due to N/V and bone marrow suppression).  Chronic prednisone tapered to d/c ~ 03/2017.  Sulfsalazine, Cimzia stopped 02/2017. Entyvio started 05/2017 and clinicallaly helpful.  Stable loose stools 4 - 5 daily.  Weight stable at 110#.  Appetite good.  Chronic loose stools managed with QID Lomotil.  No longer uses codeine as he had in the past for control of BMs.  His last Entyvio infusion was on 08/18/17, next infusion is due 10/04/17 EGD 03/21/2009 - normal Colonoscopy12/20/2011 - colo-colonic anastomosis at 60cm, active colitis and nonobstructive stricture at 70cm, ileocolonic stricture at 75cm CT enterography 03/05/17:  long segment of inflammation of the mid to distal small bowel, possible low grade obstruction Non-contrast CT abd/pelvis 05/19/17:  1. Postoperative changes in and around the left kidney are improving since previous study. There is residual perinephric and ileus psoas hematoma, extraluminal gas in the left pararenal space, and left ureteral stent.  2. Multiple tiny stone  fragments demonstrated in the left renal pelvis and along the left ureter. 3. Left hydronephrosis may indicate poor stent function.  4. Left lower quadrant colostomy. Multiple areas of small bowel and colonic wall thickening consistent with changes of Crohn's disease.  Prominent retroperitoneal lymph nodes are likely reactive.  5. Bilateral pleural effusions with basilar atelectasis.   Other issues include: nephrolithiasis. S/P 05/14/2017 cystoscopy, nephrolithotomy, stone removal, nephrostomy tube placement.  S/p 05/20/17 ureteral stent exchange due to obstructed stent.  Anemia, on po Iron.  Protein malnutrition.  Anxiety with panic features.     Beginning 2 weeks ago the patient developed symptoms that he thought might be a gastrointestinal bug/flu (received flu shot in 05/2017).  He became anorexic, nauseated.  Was mostly having dry heaves and not a lot of actual emesis.  Generalized abdominal pain reaching 8/18 intensity compared to chronic 3-4/10 abdominal discomfort.  He felt very tired and weak.  His bones and muscles felt sore all over.  No sweats or chills.  He was not eating as much and bowel movements decreased but were still loose.  He did not take any medications for his symptoms.  He also denies new medications within the last few months.  He came to the emergency room yesterday around 2 PM. C diff negative.  WBCs normal.  Hgb 14.8 >> 12.5 following IV fluids, it was 12.4 on 08/16/17.  Potassium low.  BUN/creatinine elevated.  Be met has improved overnight.  Lactic acidosis has resolved. Following IV fluids as well as a few doses of morphine, patient's pain has improved significantly and he is feeling better.  He is tolerating clear liquids and asks if he can get more substantial food to eat.  Non-contrast CT:  Loop of bowel (ileum?) thickened, associated mesentery inflamed.  Similar appearance to CT of 05/19/17  Past Medical History:  Diagnosis Date  . Acute endocarditis   . Crohn's disease  ([place])   . Depression   . Eating disorder   . GERD (gastroesophageal reflux disease)   . Kidney stones   . Short gut syndrome     Past Surgical History:  Procedure Laterality Date  . CYSTOSCOPY WITH STENT PLACEMENT Left 05/20/2017   Procedure: CYSTOSCOPY WITH STENT PLACEMENT;  Surgeon: [name], MD;  Location: [facility];  Service: Urology;  Laterality: Left;  . ILEOSTOMY    . IR NEPHROSTOMY EXCHANGE LEFT  04/12/2017  . IR NEPHROSTOMY PLACEMENT LEFT  04/12/2017  . IR NEPHROSTOMY PLACEMENT LEFT  04/29/2017  . NEPHROLITHOTOMY Left 05/14/2017   Procedure: LEFT NEPHROLITHOTOMY PERCUTANEOUS WITH SURGEON ACCESS;  Surgeon: [name], MD;  Location: [facility];  Service: Urology;  Laterality: Left;  . SMALL INTESTINE SURGERY    . SUBTOTAL COLECTOMY      Prior to Admission medications   Medication Sig Start Date End Date Taking? Authorizing Provider  cholecalciferol (VITAMIN D) 400 units TABS tablet Take 400 Units by mouth daily.   Yes [provider]  diphenoxylate-atropine (LOMOTIL) 2.5-0.025 MG tablet Take 1 Tablet by mouth 4 times a day 06/28/17  Yes Armbruster, Carlota Raspberry, MD  escitalopram (LEXAPRO) 10 MG tablet Take 10 mg by mouth daily.   Yes [provider]  ferrous sulfate 325 (65 FE) MG tablet Take 1 tablet (325 mg total) 2 (two) times daily with a meal by mouth. 07/12/17  Yes Armbruster, Carlota Raspberry, MD  folic acid (FOLVITE) 1 MG tablet Take 1 Tablet by mouth once daily 06/01/17  Yes Armbruster, Carlota Raspberry, MD  ipratropium (ATROVENT) 0.03 % nasal spray Place 2 sprays 2 (two) times daily into both nostrils. Can use 3 times a day as needed 07/12/17  Yes Armbruster, Carlota Raspberry, MD  Multiple Vitamin (MULTIVITAMIN WITH MINERALS) TABS tablet Take 1 tablet by mouth daily. 05/03/17  Yes Rai, Ripudeep K, MD  ondansetron (ZOFRAN-ODT) 4 MG disintegrating tablet Take 1 tablet (4 mg total) by mouth every 8 (eight) hours as needed for nausea or vomiting. 05/02/17  Yes Rai, Ripudeep K,  MD  potassium chloride SA (K-DUR,KLOR-CON) 20 MEQ tablet Take 1 tablet (20 mEq total) by mouth as directed. Patient taking differently: Take 20 mEq by mouth daily.  04/02/17  Yes Armbruster, Carlota Raspberry, MD  ranitidine (ZANTAC) 150 MG tablet Take 1 tablet (150 mg total) by mouth 2 (two) times daily. 03/09/17  Yes Armbruster, Carlota Raspberry, MD  sertraline (ZOLOFT) 50 MG tablet Take 1 tablet (50 mg total) by mouth daily. 08/26/17  Yes Billie Ruddy, MD  traMADol (ULTRAM) 50 MG tablet Take 1 tablet (50 mg total) by mouth every 8 (eight) hours as needed. 07/29/17  Yes Billie Ruddy, MD  Vedolizumab (ENTYVIO IV) Inject into the vein.   Yes [provider]  midodrine (PROAMATINE) 10 MG tablet Take 1 tablet (10 mg total) by mouth 2 (two) times daily with a meal. Patient not taking: Reported on 09/14/2017 05/02/17  Rai, Ripudeep K, MD    Scheduled Meds: . enoxaparin (LOVENOX) injection  40 mg Subcutaneous QHS   Infusions: . 0.9 % NaCl with KCl 20 mEq / L 125 mL/hr at 09/14/17 2106   PRN Meds: acetaminophen **OR** acetaminophen, albuterol, morphine injection, ondansetron **OR** ondansetron (ZOFRAN) IV   Allergies as of 09/14/2017 - Review Complete 09/14/2017  Allergen Reaction Noted  . Methotrexate derivatives Other (See Comments) 04/22/2017  . Humira [adalimumab] Other (See Comments) 02/23/2017  . Penicillins Other (See Comments) 02/23/2017  . Remicade [infliximab] Other (See Comments) 02/23/2017    Family History  Problem Relation Age of Onset  . Heart disease Mother   . Diabetes Father   . Heart disease Father   . Heart attack Father   . Melanoma Sister   . Crohn's disease Brother   . Inflammatory bowel disease Brother   . Diabetes Paternal Grandmother     Social History   Socioeconomic History  . Marital status: Divorced    Spouse name: Not on file  . Number of children: Not on file  . Years of education: Not on file  . Highest education level: Not on file  Social Needs    . Financial resource strain: Not on file  . Food insecurity - worry: Not on file  . Food insecurity - inability: Not on file  . Transportation needs - medical: Not on file  . Transportation needs - non-medical: Not on file  Occupational History  . Occupation: Retired  Tobacco Use  . Smoking status: Former Smoker    Packs/day: 1.00    Years: 20.00    Pack years: 20.00    Types: Cigarettes    Last attempt to quit: 08/31/2006    Years since quitting: 11.0  . Smokeless tobacco: Never Used  Substance and Sexual Activity  . Alcohol use: No  . Drug use: No  . Sexual activity: Not on file  Other Topics Concern  . Not on file  Social History Narrative  . Not on file    REVIEW OF SYSTEMS: Constitutional:  Per HPI ENT:  No nose bleeds.  Patient has lost all of his teeth and undergone multiple dental extractions.  He has dentures but they do not fit well. Pulm: No cough.  No dyspnea on exertion. CV:  No palpitations, no LE edema.  No chest pain. GU:  No hematuria, no frequency GI:  Per HPI Heme: No unusual bleeding or bruising. Transfusions:  05/2017 got 1 U PRBC during admit for nephrolithiasis (Hgb 6.7).   Neuro:  No headaches, no peripheral tingling or numbness.  Wears trifocal glasses and reports no visual disturbances Derm: Occasional rashes, none currently.  No pruritus. MS: Has stiffness and some pain in his fingers, knees and back. Endocrine:  No sweats or chills.  No polyuria or dysuria Immunization:  Reviewed.   Travel:  None beyond local counties in last few months.    PHYSICAL EXAM: Vital signs in last 24 hours: Vitals:   09/15/17 0459 09/15/17 0720  BP: 101/75 106/78  Pulse: 63   Resp: 13 16  Temp:    SpO2: 100% 100%   Wt Readings from Last 3 Encounters:  07/29/17 51.6 kg (113 lb 12.8 oz)  07/12/17 49.9 kg (110 lb)  06/04/17 49.4 kg (108 lb 12.8 oz)    General: Moderately chronically ill appearing WM who is alert and comfortable. Head: No facial asymmetry  or swelling.  Prominent bony structure, thin looking.  No signs of head trauma  Eyes: No scleral icterus.  No conjunctival pallor.  EOMI. Ears: Not hard of hearing Nose: No congestion or discharge. Mouth: Moist, clear, pink oral MM.  Tongue midline.  Edentulous. Neck: No thyromegaly, masses, JVD. Lungs: Clear bilaterally.  Not short of breath.  No cough. Heart: RRR.  No MRG.  S1, S2 present. Abdomen: Soft.  Nontender.  Bowel sounds active.  Not distended.  Sloshing liquid in the ostomy bag situated at the left lower quadrant.  Extensive midline surgical scar is well-healed..   Rectal: Did not perform Musc/Skeltl: No obvious joint deformities, redness, swelling. Extremities: No CCE. Neurologic: Full limb strength.  No tremors.  Grossly neurologically intact. Skin: No sores, rashes, AV malformations or significant bruising Tattoos: None Nodes: No cervical adenopathy. Psych: Calm, cooperative.  Intake/Output from previous day: 01/15 0701 - 01/16 0700 In: 2250 [IV Piggyback:2250] Out: -  Intake/Output this shift: No intake/output data recorded.  LAB RESULTS: Recent Labs    09/14/17 1402 09/15/17 0413  WBC 10.2 7.8  HGB 14.8 12.5*  HCT 42.1 36.9*  PLT 354 308   BMET Lab Results  Component Value Date   NA 135 09/15/2017   NA 129 (L) 09/14/2017   NA 138 08/16/2017   K 3.3 (L) 09/15/2017   K 2.5 (LL) 09/14/2017   K 3.8 08/16/2017   CL 110 09/15/2017   CL 97 (L) 09/14/2017   CL 108 08/16/2017   CO2 20 (L) 09/15/2017   CO2 20 (L) 09/14/2017   CO2 23 08/16/2017   GLUCOSE 111 (H) 09/15/2017   GLUCOSE 112 (H) 09/14/2017   GLUCOSE 97 08/16/2017   BUN 15 09/15/2017   BUN 25 (H) 09/14/2017   BUN 8 08/16/2017   CREATININE 1.16 09/15/2017   CREATININE 1.81 (H) 09/14/2017   CREATININE 0.97 08/16/2017   CALCIUM 8.1 (L) 09/15/2017   CALCIUM 8.5 (L) 09/14/2017   CALCIUM 7.9 (L) 08/16/2017   LFT Recent Labs    09/14/17 1402  PROT 7.1  ALBUMIN 3.1*  AST 20  ALT 19   ALKPHOS 95  BILITOT 0.9   PT/INR Lab Results  Component Value Date   INR 1.18 05/18/2017   INR 0.97 04/11/2017   Hepatitis Panel No results for input(s): HEPBSAG, HCVAB, HEPAIGM, HEPBIGM in the last 72 hours. C-Diff No components found for: CDIFF Lipase     Component Value Date/Time   LIPASE 23 04/10/2017 1628    Drugs of Abuse  No results found for: LABOPIA, COCAINSCRNUR, LABBENZ, AMPHETMU, THCU, LABBARB   RADIOLOGY STUDIES: Ct Abdomen Pelvis Wo Contrast  Result Date: 09/14/2017 CLINICAL DATA:  Patient presents c/o weakness and having no energy. Patient concerned his hemoglobin could be low. C/o abdominal pain and nausea. Pt presents lethargic. Hx of crohn's and has colostomy bag. Denies chest pain. EXAM: CT ABDOMEN AND PELVIS WITHOUT CONTRAST TECHNIQUE: Multidetector CT imaging of the abdomen and pelvis was performed following the standard protocol without IV contrast. COMPARISON:  05/19/2017 FINDINGS: Lower chest: Heart is normal size. There are emphysematous blebs in the anterior lung bases. Mild dependent subsegmental atelectasis is noted in the left lower lobe. No acute findings. Hepatobiliary: Liver is unremarkable. Gallbladder not visualize, presumed surgically absent. Common bile duct measures a maximum of 9 mm in diameter with distal tapering, unchanged from the prior CT. Pancreas: Unremarkable. No pancreatic ductal dilatation or surrounding inflammatory changes. Spleen: Normal in size without focal abnormality. Adrenals/Urinary Tract: Adrenal glands are unremarkable. Kidneys are normal, without renal calculi, focal lesion, or hydronephrosis. Bladder is unremarkable.  Stomach/Bowel: In the left mid to upper abdomen, there is a dilated bowel loop which appears to be the colon, dilated to 6.4 cm. There is an associated anastomosis staple line. This connects to a prominent loop of thick walled bowel, that is likely ileum, which extends into the pelvis and from there into the left mid  to upper abdomen. Wall measures up to 9 mm in thickness. There is mild associated hazy opacity in the mesentery and several prominent to mildly enlarged mesenteric lymph nodes, largest measuring 1 cm short axis. No other bowel inflammation. An anastomosis staple line is noted at the rectosigmoid junction. Vascular/Lymphatic: No other prominent or enlarged lymph nodes. No significant vascular abnormality. Reproductive: Unremarkable. Other: Stable appearance of the left lower quadrant colostomy. No ascites. Musculoskeletal: No fracture or acute finding. No osteoblastic or osteolytic lesions. Disk degenerative changes at L5-S1. IMPRESSION: 1. Thick walled loop of bowel with mild associated mesenteric inflammation. This appears to be ileum, which extends from the left mid abdomen anastomosis into the pelvis and right lower quadrant. This was present to a similar degree on the prior CT. Active Crohn's disease is suspected. 2. No other acute abnormalities. No evidence of an abscess or fistula. Electronically Signed   By: Lajean Manes M.D.   On: 09/14/2017 19:29   Dg Chest 2 View  Result Date: 09/14/2017 CLINICAL DATA:  Shortness of breath EXAM: CHEST  2 VIEW COMPARISON:  None FINDINGS: The lungs are hyperinflated likely secondary to COPD. There is no focal consolidation. There is no pleural effusion or pneumothorax. The heart and mediastinal contours are unremarkable. The osseous structures are unremarkable. IMPRESSION: No active cardiopulmonary disease. Emphysema (ICD10-J43.9). Electronically Signed   By: Kathreen Devoid   On: 09/14/2017 14:35     IMPRESSION:   *  2 weeks of increased abdominal pain, anorexia, nausea.  Diminished frequency of BMs.  Flare of Crohn's with ileitis?  Interestingly his current, unfortunately noncontrast, CT is not much different than it was in September, 4 months ago.  However his symptoms have only been acutely flaring for the 2 weeks.  Interestingly stool frequency/volume overall  reduced since sxs and decreased po intake started but with stricturing seen on CTE last year, he could have element of obstruction and active dz.   .    *  AKI due to dehydration.Marland Kitchen  Resolved with IVF.    *  Hypokalemia.  Improved following runs of potassium but not yet resolved. Hypomagnesemia, resolved post supplement.   Hypocalcemia (false): normal when corrected for hypoalbuminemia.     PLAN:     *  Continue supportive care, correct electrolytes, continue IVF.  At pt request (c/o being hungry) will advance to Full liquids.   After d/w Dr Silverio Decamp, starting Prednisone 40 mg po daily.   CRP and sed rate now.  BMET in AM.    Azucena Freed  09/15/2017, 8:57 AM Pager: [phone]   Attending physician's note   I have taken a history, examined the patient and reviewed the chart. I agree with the Advanced Practitioner's note, impression and recommendations. 57 year old male with history of Crohn's disease with stricture, fistula, abscess status post multiple small bowel resections, diverting ileostomy currently on Entyvio since September 2018.  His symptoms were more stable and he was doing well until 2 weeks ago.  CT enterography showed long segment of possible inflammation mid to distal small bowel in July 2018.  C. difficile negative.  ESR mildly elevated at 17 Noncontrast CT on admission showed  thickening of ileum with inflammation of adjacent mesentery. He feels better after IV hydration and repletion of electrolytes. Will start on prednisone 40 mg daily We will tentatively plan for scope through ileostomy on Friday, do the bowel prep tomorrow if he is able to tolerate Continue supportive care  K Denzil Magnuson, MD [phone] Mon-Fri 8a-5p [phone] after 5p, weekends, holidays

## 2017-09-15 NOTE — ED Notes (Signed)
ED TO INPATIENT HANDOFF REPORT  Name/Age/Gender Gregory [name]. 57 y.o. male  Code Status    Code Status Orders  (From admission, onward)        Start     Ordered   09/14/17 2031  Full code  Continuous     09/14/17 2035    Code Status History    Date Active Date Inactive Code Status Order ID Comments User Context   05/18/2017 21:33 05/22/2017 18:11 Full Code [id number]  Bethena Roys, MD Inpatient   04/28/2017 12:34 05/02/2017 15:45 Full Code [id number]  Waldemar Dickens, MD Inpatient   04/11/2017 00:51 04/13/2017 15:58 Full Code [id number]  Kirke Shaggy, MD Inpatient      Home/SNF/Other Home  Chief Complaint weak / no energy   Level of Care/Admitting Diagnosis ED Disposition    ED Disposition Condition [facility] Area: [place] [[id number]]  Level of Care: Telemetry [5]  Admit to tele based on following criteria: Complex arrhythmia (Bradycardia/Tachycardia)  Diagnosis: Crohn's disease of ileum without complication [facility]) [[id number]]  Admitting Physician: Norval Morton [[id number]]  Attending Physician: Norval Morton [[id number]]  Estimated length of stay: past midnight tomorrow  Certification:: I certify this patient will need inpatient services for at least 2 midnights  PT Class (Do Not Modify): Inpatient [101]  PT Acc Code (Do Not Modify): Private [1]       Medical History Past Medical History:  Diagnosis Date  . Acute endocarditis   . Crohn's disease ([place])   . Depression   . Eating disorder   . GERD (gastroesophageal reflux disease)   . Kidney stones   . Short gut syndrome     Allergies Allergies  Allergen Reactions  . Methotrexate Derivatives Other (See Comments)    Anemia, low WBC, severe GI symptoms,   . Humira [Adalimumab] Other (See Comments)    Intolerance  . Penicillins Other (See Comments)    REACTION DURING SURGERY Has patient had a PCN reaction causing immediate rash, facial/tongue/throat  swelling, SOB or lightheadedness with hypotension: Unknown Has patient had a PCN reaction causing severe rash involving mucus membranes or skin necrosis: Unknown Has patient had a PCN reaction that required hospitalization: Unknown Has patient had a PCN reaction occurring within the last 10 years: No If all of the above answers are "NO", then may proceed with Cephalosporin use.   . Remicade [Infliximab] Other (See Comments)    Intolerance    IV Location/Drains/Wounds Patient Lines/Drains/Airways Status   Active Line/Drains/Airways    Name:   Placement date:   Placement time:   Site:   Days:   Peripheral IV 09/14/17 Right Forearm   09/14/17    1415    Forearm   1   Peripheral IV 09/15/17 Left Forearm   09/15/17    0111    Forearm   less than 1   Ileostomy LLQ   04/12/17    1043    LLQ   156   Incision (Closed) 05/14/17 Back Left   05/14/17    1033     124          Labs/Imaging Results for orders placed or performed during the hospital encounter of 09/14/17 (from the past 48 hour(s))  Comprehensive metabolic panel     Status: Abnormal   Collection Time: 09/14/17  2:02 PM  Result Value Ref Range   Sodium 129 (L) 135 - 145 mmol/L   Potassium 2.5 (LL) 3.5 -  5.1 mmol/L    Comment: CRITICAL RESULT CALLED TO, READ BACK BY AND VERIFIED WITH: HANKS,K. RN @1556  ON 01.15.19 BY COHEN,K    Chloride 97 (L) 101 - 111 mmol/L   CO2 20 (L) 22 - 32 mmol/L   Glucose, Bld 112 (H) 65 - 99 mg/dL   BUN 25 (H) 6 - 20 mg/dL   Creatinine, Ser 1.81 (H) 0.61 - 1.24 mg/dL   Calcium 8.5 (L) 8.9 - 10.3 mg/dL   Total Protein 7.1 6.5 - 8.1 g/dL   Albumin 3.1 (L) 3.5 - 5.0 g/dL   AST 20 15 - 41 U/L   ALT 19 17 - 63 U/L   Alkaline Phosphatase 95 38 - 126 U/L   Total Bilirubin 0.9 0.3 - 1.2 mg/dL   GFR calc non Af Amer 40 (L) >60 mL/min   GFR calc Af Amer 47 (L) >60 mL/min    Comment: (NOTE) The eGFR has been calculated using the CKD EPI equation. This calculation has not been validated in all clinical  situations. eGFR's persistently <60 mL/min signify possible Chronic Kidney Disease.    Anion gap 12 5 - 15  CBC with Differential     Status: Abnormal   Collection Time: 09/14/17  2:02 PM  Result Value Ref Range   WBC 10.2 4.0 - 10.5 K/uL   RBC 5.13 4.22 - 5.81 MIL/uL   Hemoglobin 14.8 13.0 - 17.0 g/dL   HCT 42.1 39.0 - 52.0 %   MCV 82.1 78.0 - 100.0 fL   MCH 28.8 26.0 - 34.0 pg   MCHC 35.2 30.0 - 36.0 g/dL   RDW 20.4 (H) 11.5 - 15.5 %   Platelets 354 150 - 400 K/uL   Neutrophils Relative % 66 %   Neutro Abs 6.8 1.7 - 7.7 K/uL   Lymphocytes Relative 21 %   Lymphs Abs 2.2 0.7 - 4.0 K/uL   Monocytes Relative 12 %   Monocytes Absolute 1.2 (H) 0.1 - 1.0 K/uL   Eosinophils Relative 1 %   Eosinophils Absolute 0.1 0.0 - 0.7 K/uL   Basophils Relative 0 %   Basophils Absolute 0.0 0.0 - 0.1 K/uL  Magnesium     Status: Abnormal   Collection Time: 09/14/17  2:02 PM  Result Value Ref Range   Magnesium 1.5 (L) 1.7 - 2.4 mg/dL  I-stat troponin, ED     Status: None   Collection Time: 09/14/17  3:13 PM  Result Value Ref Range   Troponin i, poc 0.00 0.00 - 0.08 ng/mL   Comment 3            Comment: Due to the release kinetics of cTnI, a negative result within the first hours of the onset of symptoms does not rule out myocardial infarction with certainty. If myocardial infarction is still suspected, repeat the test at appropriate intervals.   I-Stat CG4 Lactic Acid, ED     Status: Abnormal   Collection Time: 09/14/17  3:15 PM  Result Value Ref Range   Lactic Acid, Venous 2.10 (HH) 0.5 - 1.9 mmol/L   Comment NOTIFIED PHYSICIAN   I-Stat CG4 Lactic Acid, ED     Status: None   Collection Time: 09/14/17  6:00 PM  Result Value Ref Range   Lactic Acid, Venous 0.61 0.5 - 1.9 mmol/L  Urinalysis, Routine w reflex microscopic     Status: None   Collection Time: 09/14/17  6:55 PM  Result Value Ref Range   Color, Urine YELLOW YELLOW   APPearance  CLEAR CLEAR   Specific Gravity, Urine 1.008  1.005 - 1.030   pH 6.0 5.0 - 8.0   Glucose, UA NEGATIVE NEGATIVE mg/dL   Hgb urine dipstick NEGATIVE NEGATIVE   Bilirubin Urine NEGATIVE NEGATIVE   Ketones, ur NEGATIVE NEGATIVE mg/dL   Protein, ur NEGATIVE NEGATIVE mg/dL   Nitrite NEGATIVE NEGATIVE   Leukocytes, UA NEGATIVE NEGATIVE  C difficile Virginie Josten scan w PCR reflex     Status: None   Collection Time: 09/15/17  4:13 AM  Result Value Ref Range   C Diff antigen NEGATIVE NEGATIVE   C Diff toxin NEGATIVE NEGATIVE   C Diff interpretation No C. difficile detected.   CBC     Status: Abnormal   Collection Time: 09/15/17  4:13 AM  Result Value Ref Range   WBC 7.8 4.0 - 10.5 K/uL   RBC 4.42 4.22 - 5.81 MIL/uL   Hemoglobin 12.5 (L) 13.0 - 17.0 g/dL   HCT 36.9 (L) 39.0 - 52.0 %   MCV 83.5 78.0 - 100.0 fL   MCH 28.3 26.0 - 34.0 pg   MCHC 33.9 30.0 - 36.0 g/dL   RDW 20.7 (H) 11.5 - 15.5 %   Platelets 308 150 - 400 K/uL  Basic metabolic panel     Status: Abnormal   Collection Time: 09/15/17  4:13 AM  Result Value Ref Range   Sodium 135 135 - 145 mmol/L   Potassium 3.3 (L) 3.5 - 5.1 mmol/L    Comment: DELTA CHECK NOTED NO VISIBLE HEMOLYSIS    Chloride 110 101 - 111 mmol/L   CO2 20 (L) 22 - 32 mmol/L   Glucose, Bld 111 (H) 65 - 99 mg/dL   BUN 15 6 - 20 mg/dL   Creatinine, Ser 1.16 0.61 - 1.24 mg/dL   Calcium 8.1 (L) 8.9 - 10.3 mg/dL   GFR calc non Af Amer >60 >60 mL/min   GFR calc Af Amer >60 >60 mL/min    Comment: (NOTE) The eGFR has been calculated using the CKD EPI equation. This calculation has not been validated in all clinical situations. eGFR's persistently <60 mL/min signify possible Chronic Kidney Disease.    Anion gap 5 5 - 15  Magnesium     Status: None   Collection Time: 09/15/17  4:13 AM  Result Value Ref Range   Magnesium 2.4 1.7 - 2.4 mg/dL  Sedimentation rate     Status: Abnormal   Collection Time: 09/15/17  4:13 AM  Result Value Ref Range   Sed Rate 17 (H) 0 - 16 mm/hr  C-reactive protein     Status: None    Collection Time: 09/15/17 12:05 PM  Result Value Ref Range   CRP <0.8 <1.0 mg/dL    Comment: Performed at [facility], [place] [address], [place], [place] [address]   Ct Abdomen Pelvis Wo Contrast  Result Date: 09/14/2017 CLINICAL DATA:  Patient presents c/o weakness and having no energy. Patient concerned his hemoglobin could be low. C/o abdominal pain and nausea. Pt presents lethargic. Hx of crohn's and has colostomy bag. Denies chest pain. EXAM: CT ABDOMEN AND PELVIS WITHOUT CONTRAST TECHNIQUE: Multidetector CT imaging of the abdomen and pelvis was performed following the standard protocol without IV contrast. COMPARISON:  05/19/2017 FINDINGS: Lower chest: Heart is normal size. There are emphysematous blebs in the anterior lung bases. Mild dependent subsegmental atelectasis is noted in the left lower lobe. No acute findings. Hepatobiliary: Liver is unremarkable. Gallbladder not visualize, presumed surgically absent. Common bile  duct measures a maximum of 9 mm in diameter with distal tapering, unchanged from the prior CT. Pancreas: Unremarkable. No pancreatic ductal dilatation or surrounding inflammatory changes. Spleen: Normal in size without focal abnormality. Adrenals/Urinary Tract: Adrenal glands are unremarkable. Kidneys are normal, without renal calculi, focal lesion, or hydronephrosis. Bladder is unremarkable. Stomach/Bowel: In the left mid to upper abdomen, there is a dilated bowel loop which appears to be the colon, dilated to 6.4 cm. There is an associated anastomosis staple line. This connects to a prominent loop of thick walled bowel, that is likely ileum, which extends into the pelvis and from there into the left mid to upper abdomen. Wall measures up to 9 mm in thickness. There is mild associated hazy opacity in the mesentery and several prominent to mildly enlarged mesenteric lymph nodes, largest measuring 1 cm short axis. No other bowel inflammation. An anastomosis staple line is  noted at the rectosigmoid junction. Vascular/Lymphatic: No other prominent or enlarged lymph nodes. No significant vascular abnormality. Reproductive: Unremarkable. Other: Stable appearance of the left lower quadrant colostomy. No ascites. Musculoskeletal: No fracture or acute finding. No osteoblastic or osteolytic lesions. Disk degenerative changes at L5-S1. IMPRESSION: 1. Thick walled loop of bowel with mild associated mesenteric inflammation. This appears to be ileum, which extends from the left mid abdomen anastomosis into the pelvis and right lower quadrant. This was present to a similar degree on the prior CT. Active Crohn's disease is suspected. 2. No other acute abnormalities. No evidence of an abscess or fistula. Electronically Signed   By: Lajean Manes M.D.   On: 09/14/2017 19:29   Dg Chest 2 View  Result Date: 09/14/2017 CLINICAL DATA:  Shortness of breath EXAM: CHEST  2 VIEW COMPARISON:  None FINDINGS: The lungs are hyperinflated likely secondary to COPD. There is no focal consolidation. There is no pleural effusion or pneumothorax. The heart and mediastinal contours are unremarkable. The osseous structures are unremarkable. IMPRESSION: No active cardiopulmonary disease. Emphysema (ICD10-J43.9). Electronically Signed   By: Kathreen Devoid   On: 09/14/2017 14:35    Pending Labs Unresulted Labs (From admission, onward)   Start     Ordered   09/16/17 8115  Basic metabolic panel  Tomorrow morning,   R     09/15/17 1043   09/16/17 0500  Magnesium  Tomorrow morning,   R    Comments:  Call MD if Magnesium <1.5    09/15/17 1043      Vitals/Pain Today's Vitals   09/15/17 0916 09/15/17 1128 09/15/17 1233 09/15/17 1334  BP: 116/72 133/88 123/80 117/75  Pulse: 60 62 65 65  Resp: 13 12 12 10   Temp:      TempSrc:      SpO2: 100% 100% 100% 100%  PainSc:        Isolation Precautions No active isolations  Medications Medications  enoxaparin (LOVENOX) injection 40 mg (40 mg Subcutaneous  Given 09/15/17 0017)  acetaminophen (TYLENOL) tablet 650 mg (not administered)    Or  acetaminophen (TYLENOL) suppository 650 mg (not administered)  ondansetron (ZOFRAN) tablet 4 mg ( Oral See Alternative 09/15/17 0727)    Or  ondansetron (ZOFRAN) injection 4 mg (4 mg Intravenous Given 09/15/17 0727)  0.9 % NaCl with KCl 20 mEq/ L  infusion ( Intravenous Rate/Dose Change 09/15/17 1108)  albuterol (PROVENTIL) (2.5 MG/3ML) 0.083% nebulizer solution 2.5 mg (not administered)  morphine 2 MG/ML injection 2-4 mg (2 mg Intravenous Given 09/14/17 2353)  famotidine (PEPCID) tablet 20 mg (20 mg Oral  Given 09/15/17 1106)  predniSONE (DELTASONE) tablet 40 mg (40 mg Oral Given 09/15/17 1106)  ondansetron (ZOFRAN) injection 4 mg (4 mg Intravenous Given 09/14/17 1505)  sodium chloride 0.9 % bolus 1,000 mL (0 mLs Intravenous Stopped 09/14/17 1750)  sodium chloride 0.9 % bolus 1,000 mL (0 mLs Intravenous Stopped 09/14/17 1750)  morphine 4 MG/ML injection 6 mg (6 mg Intravenous Given 09/14/17 1514)  iopamidol (ISOVUE-300) 61 % injection 30 mL (30 mLs Oral Contrast Given 09/14/17 1626)  potassium chloride SA (K-DUR,KLOR-CON) CR tablet 60 mEq (60 mEq Oral Given 09/14/17 1632)  potassium chloride 10 mEq in 100 mL IVPB (0 mEq Intravenous Stopped 09/14/17 1843)  magnesium oxide (MAG-OX) tablet 400 mg (400 mg Oral Given 09/14/17 1749)  morphine 4 MG/ML injection 6 mg (6 mg Intravenous Given 09/14/17 2030)  ondansetron (ZOFRAN) injection 4 mg (4 mg Intravenous Given 09/14/17 2029)  magnesium sulfate IVPB 2 g 50 mL (0 g Intravenous Stopped 09/15/17 0212)  calcium gluconate 1 g in sodium chloride 0.9 % 100 mL IVPB (0 g Intravenous Stopped 09/15/17 0827)    Mobility walks

## 2017-09-15 NOTE — Plan of Care (Signed)
  Education: Knowledge of General Education information will improve 09/15/2017 2119 - Progressing by Ashley Murrain, RN

## 2017-09-15 NOTE — Progress Notes (Signed)
Pt admitted to unit from ED, assessment complete, VS obtained and telemetry applied, oriented to unit, call light placed in reach

## 2017-09-15 NOTE — Progress Notes (Signed)
PROGRESS NOTE        PATIENT DETAILS Name: Gregory [name]. Age: 57 y.o. Sex: male Date of Birth: 1960-12-19 Admit Date: 09/14/2017 Admitting Physician Norval Morton, MD IRW:[id number], Langley Adie, MD  Brief Narrative: Patient is a 57 y.o. 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  Thought to have a Crohn's disease flare and admitted to the hospitalist service.See below for further details  Subjective: Lying comfortably in bed-feels better.  Abdominal pain is stable.  No nausea vomiting.   Assessment/Plan: Suspected Crohn's disease flare: Feels better-does have chronic diarrhea/increased ostomy output at baseline probably due to short gut syndrome-but claims that the output has increased lately.  C. difficile PCR negative.  Await GI input.  On vedolizumab as outpatient.  Nausea/vomiting: Much improved-continue supportive care.  Hypokalemia: Continue to replete and recheck.Probably secondary to GI loss.  Acute kidney injury: Likely hemodynamically mediated kidney injury-improving with supportive care and IV fluids.  DVT Prophylaxis: Prophylactic Lovenox   Code Status: Full code   Family Communication: None at bedside  Disposition Plan: Remain inpatient  Antimicrobial agents: Anti-infectives (From admission, onward)   None      Procedures: None  CONSULTS:  GI  Time spent: 25 minutes-Greater than 50% of this time was spent in counseling, explanation of diagnosis, planning of further management, and coordination of care.  MEDICATIONS: Scheduled Meds: . enoxaparin (LOVENOX) injection  40 mg Subcutaneous QHS   Continuous Infusions: . 0.9 % NaCl with KCl 20 mEq / L 125 mL/hr at 09/14/17 2106   PRN Meds:.acetaminophen **OR** acetaminophen, albuterol, morphine injection,  ondansetron **OR** ondansetron (ZOFRAN) IV   PHYSICAL EXAM: Vital signs: Vitals:   09/15/17 0418 09/15/17 0459 09/15/17 0720 09/15/17 0916  BP: 102/87 101/75 106/78 116/72  Pulse: 64 63  60  Resp: 11 13 16 13   Temp:      TempSrc:      SpO2: 99% 100% 100% 100%   There were no vitals filed for this visit. There is no height or weight on file to calculate BMI.   General appearance :Awake, alert, not in any distress. Speech Clear. Not toxic Looking Eyes:, pupils equally reactive to light and accomodation,no scleral icterus.Pink conjunctiva HEENT: Atraumatic and Normocephalic Neck: supple, no JVD. No cervical lymphadenopathy. No thyromegaly Resp:Good air entry bilaterally, no added sounds  CVS: S1 S2 regular, no murmurs.  GI: Bowel sounds present, Non tender and not distended with no gaurding, rigidity or rebound.No organomegaly.ostomy in place Extremities: B/L Lower Ext shows no edema, both legs are warm to touch Neurology:  speech clear,Non focal, sensation is grossly intact. Psychiatric: Normal judgment and insight. Alert and oriented x 3. Normal mood. Musculoskeletal:No digital cyanosis Skin:No Rash, warm and dry Wounds:N/A  I have personally reviewed following labs and imaging studies  LABORATORY DATA: CBC: Recent Labs  Lab 09/14/17 1402 09/15/17 0413  WBC 10.2 7.8  NEUTROABS 6.8  --   HGB 14.8 12.5*  HCT 42.1 36.9*  MCV 82.1 83.5  PLT 354 008    Basic Metabolic Panel: Recent Labs  Lab 09/14/17 1402 09/15/17 0413  NA 129* 135  K 2.5* 3.3*  CL 97* 110  CO2 20* 20*  GLUCOSE 112* 111*  BUN 25* 15  CREATININE 1.81* 1.16  CALCIUM 8.5* 8.1*  MG 1.5* 2.4    GFR: CrCl cannot be calculated (Unknown ideal weight.).  Liver Function Tests: Recent Labs  Lab 09/14/17 1402  AST 20  ALT 19  ALKPHOS 95  BILITOT 0.9  PROT 7.1  ALBUMIN 3.1*   No results for input(s): LIPASE, AMYLASE in the last 168 hours. No results for input(s): AMMONIA in the last 168  hours.  Coagulation Profile: No results for input(s): INR, PROTIME in the last 168 hours.  Cardiac Enzymes: No results for input(s): CKTOTAL, CKMB, CKMBINDEX, TROPONINI in the last 168 hours.  BNP (last 3 results) No results for input(s): PROBNP in the last 8760 hours.  HbA1C: No results for input(s): HGBA1C in the last 72 hours.  CBG: No results for input(s): GLUCAP in the last 168 hours.  Lipid Profile: No results for input(s): CHOL, HDL, LDLCALC, TRIG, CHOLHDL, LDLDIRECT in the last 72 hours.  Thyroid Function Tests: No results for input(s): TSH, T4TOTAL, FREET4, T3FREE, THYROIDAB in the last 72 hours.  Anemia Panel: No results for input(s): VITAMINB12, FOLATE, FERRITIN, TIBC, IRON, RETICCTPCT in the last 72 hours.  Urine analysis:    Component Value Date/Time   COLORURINE YELLOW 09/14/2017 1855   APPEARANCEUR CLEAR 09/14/2017 1855   LABSPEC 1.008 09/14/2017 1855   PHURINE 6.0 09/14/2017 1855   GLUCOSEU NEGATIVE 09/14/2017 1855   HGBUR NEGATIVE 09/14/2017 1855   BILIRUBINUR NEGATIVE 09/14/2017 1855   KETONESUR NEGATIVE 09/14/2017 1855   PROTEINUR NEGATIVE 09/14/2017 1855   NITRITE NEGATIVE 09/14/2017 1855   LEUKOCYTESUR NEGATIVE 09/14/2017 1855    Sepsis Labs: Lactic Acid, Venous    Component Value Date/Time   LATICACIDVEN 0.61 09/14/2017 1800    MICROBIOLOGY: Recent Results (from the past 240 hour(s))  C difficile quick scan w PCR reflex     Status: None   Collection Time: 09/15/17  4:13 AM  Result Value Ref Range Status   C Diff antigen NEGATIVE NEGATIVE Final   C Diff toxin NEGATIVE NEGATIVE Final   C Diff interpretation No C. difficile detected.  Final    RADIOLOGY STUDIES/RESULTS: Ct Abdomen Pelvis Wo Contrast  Result Date: 09/14/2017 CLINICAL DATA:  Patient presents c/o weakness and having no energy. Patient concerned his hemoglobin could be low. C/o abdominal pain and nausea. Pt presents lethargic. Hx of crohn's and has colostomy bag. Denies  chest pain. EXAM: CT ABDOMEN AND PELVIS WITHOUT CONTRAST TECHNIQUE: Multidetector CT imaging of the abdomen and pelvis was performed following the standard protocol without IV contrast. COMPARISON:  05/19/2017 FINDINGS: Lower chest: Heart is normal size. There are emphysematous blebs in the anterior lung bases. Mild dependent subsegmental atelectasis is noted in the left lower lobe. No acute findings. Hepatobiliary: Liver is unremarkable. Gallbladder not visualize, presumed surgically absent. Common bile duct measures a maximum of 9 mm in diameter with distal tapering, unchanged from the prior CT. Pancreas: Unremarkable. No pancreatic ductal dilatation or surrounding inflammatory changes. Spleen: Normal in size without focal abnormality. Adrenals/Urinary Tract: Adrenal glands are unremarkable. Kidneys are normal, without renal calculi, focal lesion, or hydronephrosis. Bladder is unremarkable. Stomach/Bowel: In the left mid to upper abdomen, there is a dilated bowel loop which appears to be the colon, dilated to 6.4 cm. There is an associated anastomosis staple line. This connects to a prominent loop of thick walled bowel, that is likely ileum, which extends into the pelvis and from there into the left mid to upper abdomen. Wall measures up to 9 mm in thickness.  There is mild associated hazy opacity in the mesentery and several prominent to mildly enlarged mesenteric lymph nodes, largest measuring 1 cm short axis. No other bowel inflammation. An anastomosis staple line is noted at the rectosigmoid junction. Vascular/Lymphatic: No other prominent or enlarged lymph nodes. No significant vascular abnormality. Reproductive: Unremarkable. Other: Stable appearance of the left lower quadrant colostomy. No ascites. Musculoskeletal: No fracture or acute finding. No osteoblastic or osteolytic lesions. Disk degenerative changes at L5-S1. IMPRESSION: 1. Thick walled loop of bowel with mild associated mesenteric inflammation.  This appears to be ileum, which extends from the left mid abdomen anastomosis into the pelvis and right lower quadrant. This was present to a similar degree on the prior CT. Active Crohn's disease is suspected. 2. No other acute abnormalities. No evidence of an abscess or fistula. Electronically Signed   By: Lajean Manes M.D.   On: 09/14/2017 19:29   Dg Chest 2 View  Result Date: 09/14/2017 CLINICAL DATA:  Shortness of breath EXAM: CHEST  2 VIEW COMPARISON:  None FINDINGS: The lungs are hyperinflated likely secondary to COPD. There is no focal consolidation. There is no pleural effusion or pneumothorax. The heart and mediastinal contours are unremarkable. The osseous structures are unremarkable. IMPRESSION: No active cardiopulmonary disease. Emphysema (ICD10-J43.9). Electronically Signed   By: Kathreen Devoid   On: 09/14/2017 14:35     LOS: 1 day   Oren Binet, MD  [facility] Pager:[phone]  If 7PM-7AM, please contact night-coverage [email or web address] Password [facility] 09/15/2017, 10:34 AM

## 2017-09-16 ENCOUNTER — Encounter (HOSPITAL_COMMUNITY): Payer: Self-pay

## 2017-09-16 LAB — BASIC METABOLIC PANEL
Anion gap: 3 — ABNORMAL LOW (ref 5–15)
BUN: 9 mg/dL (ref 6–20)
CALCIUM: 8.1 mg/dL — AB (ref 8.9–10.3)
CHLORIDE: 115 mmol/L — AB (ref 101–111)
CO2: 20 mmol/L — AB (ref 22–32)
CREATININE: 1.04 mg/dL (ref 0.61–1.24)
GFR calc Af Amer: >60 mL/min (ref >60)
GFR calc non Af Amer: >60 mL/min (ref >60)
GLUCOSE: 106 mg/dL — AB (ref 65–99)
Potassium: 3.7 mmol/L (ref 3.5–5.1)
Sodium: 138 mmol/L (ref 135–145)

## 2017-09-16 LAB — MAGNESIUM: Magnesium: 1.7 mg/dL (ref 1.7–2.4)

## 2017-09-16 MED ORDER — PEG-KCL-NACL-NASULF-NA ASC-C 100 G PO SOLR: 1.0000 | Freq: Once | ORAL | Status: DC

## 2017-09-16 MED ORDER — PEG-KCL-NACL-NASULF-NA ASC-C 100 G PO SOLR
0.5000 | Freq: Once | ORAL | Status: AC
Start: 2017-09-16 — End: 2017-09-16
  Administered 2017-09-16: 100 g via ORAL
  Filled 2017-09-16: qty 1

## 2017-09-16 MED ORDER — SODIUM CHLORIDE 0.9 % IV SOLN: INTRAVENOUS | Status: DC

## 2017-09-16 MED ORDER — PEG-KCL-NACL-NASULF-NA ASC-C 100 G PO SOLR
0.5000 | Freq: Once | ORAL | Status: AC
  Administered 2017-09-17: 100 g via ORAL
  Filled 2017-09-16: qty 1

## 2017-09-16 NOTE — H&P (View-Only) (Signed)
Gastroenterology Progress Note  CC:  Crohn's disease  Subjective:  Feels better.  Having a lot of ostomy output, which he thinks is from the IV fluids.  Objective:  Vital signs in last 24 hours: Temp:  [98 F (36.7 C)-98.7 F (37.1 C)] 98.3 F (36.8 C) (01/17 0606) Pulse Rate:  [60-68] 60 (01/17 0606) Resp:  [10-19] 16 (01/17 0606) BP: (101-133)/(59-88) 115/66 (01/17 0606) SpO2:  [100 %] 100 % (01/17 0606) Weight:  [125 lb 0 oz (56.7 kg)] 125 lb 0 oz (56.7 kg) (01/16 1502)   General: Alert, Well-developed, in NAD Heart:  Regular rate and rhythm; no murmurs Pulm:  CTAB.  No increased WOB. Abdomen:  Soft, non-distended.  BS present.  Minimal TTP.  Ileostomy noted.  Mid-line surgical scar that is well-healed. Extremities:  Without edema. Neurologic:  Alert and oriented x 4;  grossly normal neurologically. Psych:  Alert and cooperative. Normal mood and affect.  Intake/Output from previous day: 01/16 0701 - 01/17 0700 In: 4662.5 [P.O.:1080; I.V.:3582.5] Out: 8 [Urine:4; Stool:4]  Lab Results: Recent Labs    09/14/17 1402 09/15/17 0413  WBC 10.2 7.8  HGB 14.8 12.5*  HCT 42.1 36.9*  PLT 354 308   BMET Recent Labs    09/14/17 1402 09/15/17 0413 09/16/17 0538  NA 129* 135 138  K 2.5* 3.3* 3.7  CL 97* 110 115*  CO2 20* 20* 20*  GLUCOSE 112* 111* 106*  BUN 25* 15 9  CREATININE 1.81* 1.16 1.04  CALCIUM 8.5* 8.1* 8.1*   LFT Recent Labs    09/14/17 1402  PROT 7.1  ALBUMIN 3.1*  AST 20  ALT 19  ALKPHOS 95  BILITOT 0.9   Ct Abdomen Pelvis Wo Contrast  Result Date: 09/14/2017 CLINICAL DATA:  Patient presents c/o weakness and having no energy. Patient concerned his hemoglobin could be low. C/o abdominal pain and nausea. Pt presents lethargic. Hx of crohn's and has colostomy bag. Denies chest pain. EXAM: CT ABDOMEN AND PELVIS WITHOUT CONTRAST TECHNIQUE: Multidetector CT imaging of the abdomen and pelvis was performed following the standard protocol  without IV contrast. COMPARISON:  05/19/2017 FINDINGS: Lower chest: Heart is normal size. There are emphysematous blebs in the anterior lung bases. Mild dependent subsegmental atelectasis is noted in the left lower lobe. No acute findings. Hepatobiliary: Liver is unremarkable. Gallbladder not visualize, presumed surgically absent. Common bile duct measures a maximum of 9 mm in diameter with distal tapering, unchanged from the prior CT. Pancreas: Unremarkable. No pancreatic ductal dilatation or surrounding inflammatory changes. Spleen: Normal in size without focal abnormality. Adrenals/Urinary Tract: Adrenal glands are unremarkable. Kidneys are normal, without renal calculi, focal lesion, or hydronephrosis. Bladder is unremarkable. Stomach/Bowel: In the left mid to upper abdomen, there is a dilated bowel loop which appears to be the colon, dilated to 6.4 cm. There is an associated anastomosis staple line. This connects to a prominent loop of thick walled bowel, that is likely ileum, which extends into the pelvis and from there into the left mid to upper abdomen. Wall measures up to 9 mm in thickness. There is mild associated hazy opacity in the mesentery and several prominent to mildly enlarged mesenteric lymph nodes, largest measuring 1 cm short axis. No other bowel inflammation. An anastomosis staple line is noted at the rectosigmoid junction. Vascular/Lymphatic: No other prominent or enlarged lymph nodes. No significant vascular abnormality. Reproductive: Unremarkable. Other: Stable appearance of the left lower quadrant colostomy. No ascites. Musculoskeletal: No fracture or acute finding.  No osteoblastic or osteolytic lesions. Disk degenerative changes at L5-S1. IMPRESSION: 1. Thick walled loop of bowel with mild associated mesenteric inflammation. This appears to be ileum, which extends from the left mid abdomen anastomosis into the pelvis and right lower quadrant. This was present to a similar degree on the  prior CT. Active Crohn's disease is suspected. 2. No other acute abnormalities. No evidence of an abscess or fistula. Electronically Signed   By: Lajean Manes M.D.   On: 09/14/2017 19:29   Dg Chest 2 View  Result Date: 09/14/2017 CLINICAL DATA:  Shortness of breath EXAM: CHEST  2 VIEW COMPARISON:  None FINDINGS: The lungs are hyperinflated likely secondary to COPD. There is no focal consolidation. There is no pleural effusion or pneumothorax. The heart and mediastinal contours are unremarkable. The osseous structures are unremarkable. IMPRESSION: No active cardiopulmonary disease. Emphysema (ICD10-J43.9). 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  2 weeks of increased abdominal pain, anorexia, nausea, diminished frequency of BMs.  ? Flare of Crohn's with ileitis vs stricture.  Interestingly his current, unfortunately noncontrast, CT is not much different than it was in September, 4 months ago., however, his symptoms have only been acutely flaring for the past 2 weeks.  ? Active disease vs stricture.  CRP normal and sed rate minimally elevated.  *AKI due to dehydration.Marland Kitchen  Resolved with IVF.      LOS: 2 days   Laban Emperor. Zehr  09/16/2017, 9:27 AM  Pager number [phone]   Attending physician's note   I have taken an interval history, reviewed the chart and examined the patient. I agree with the Advanced Practitioner's note, impression and recommendations.  Feels better, he is having increased ostomy output. On prednisone 40 mg daily Plan for ileoscopy with biopsies tomorrow Unclear small bowel stricture secondary to Crohn's versus anastomotic If no active inflammation on biopsies, we can consider tapering off steroids rapidly The risks and benefits as well as alternatives of endoscopic procedure(s) have been discussed and  reviewed. All questions answered. The patient agrees to proceed.  Damaris Hippo, MD [phone] Mon-Fri 8a-5p [phone] after 5p, weekends, holidays

## 2017-09-16 NOTE — Progress Notes (Signed)
PROGRESS NOTE        PATIENT DETAILS Name: Gregory [name]. Age: 57 y.o. Sex: male Date of Birth: 1961-08-11 Admit Date: 09/14/2017 Admitting Physician Norval Morton, MD JGO:TLXBW, Langley Adie, MD  Brief Narrative: Patient is a 57 y.o. 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  Thought to have a Crohn's disease flare and admitted to the hospitalist service.See below for further details  Subjective: Feels much better-thinks he has more energy-did have some abdominal pain overnight.  Continues to have high ostomy output which he thinks is from IV fluids.   Assessment/Plan: Suspected Crohn's disease flare: Improved-continues to have increased ostomy output more than baseline-which he attributes to IV fluids.  C. difficile PCR negative.  Started on prednisone by gastroenterology, with plans for colonoscopy on 1/18.  Patient is on vedolizumab as outpatient.  Nausea/vomiting: Resolved-continue supportive care.  Hypokalemia: Repleted-likely secondary to GI loss.  Acute kidney injury: Resolved-likely hemodynamically mediated acute kidney injury in the setting of GI loss.    DVT Prophylaxis: Prophylactic Lovenox   Code Status: Full code   Family Communication: None at bedside  Disposition Plan: Remain inpatient  Antimicrobial agents: Anti-infectives (From admission, onward)   None      Procedures: None  CONSULTS:  GI  Time spent: 25 minutes-Greater than 50% of this time was spent in counseling, explanation of diagnosis, planning of further management, and coordination of care.  MEDICATIONS: Scheduled Meds: . enoxaparin (LOVENOX) injection  40 mg Subcutaneous QHS  . famotidine  20 mg Oral Daily  . predniSONE  40 mg Oral Q breakfast   Continuous Infusions: . 0.9 % NaCl with  KCl 20 mEq / L 100 mL/hr at 09/16/17 0208   PRN Meds:.acetaminophen **OR** acetaminophen, albuterol, morphine injection, ondansetron **OR** ondansetron (ZOFRAN) IV   PHYSICAL EXAM: Vital signs: Vitals:   09/15/17 1443 09/15/17 1502 09/15/17 2306 09/16/17 0606  BP: (!) 116/59 114/69 101/71 115/66  Pulse: 66 67 68 60  Resp: 19 18 17 16   Temp:  98 F (36.7 C) 98.7 F (37.1 C) 98.3 F (36.8 C)  TempSrc:  Oral Oral Oral  SpO2: 100% 100% 100% 100%  Weight:  56.7 kg (125 lb 0 oz)    Height:  5' 11"  (1.803 m)     Filed Weights   09/15/17 1502  Weight: 56.7 kg (125 lb 0 oz)   Body mass index is 17.43 kg/m.   General appearance :Awake, alert, not in any distress.  Eyes:, pupils equally reactive to light and accomodation,no scleral icterus. HEENT: Atraumatic and Normocephalic Neck: supple, no JVD. Resp:Good air entry bilaterally, no rales or rhonchi CVS: S1 S2 regular, no murmurs.  GI: Bowel sounds present, Non tender and not distended with no gaurding, rigidity or rebound. Extremities: B/L Lower Ext shows no edema, both legs are warm to touch Neurology:  speech clear,Non focal, sensation is grossly intact. Psychiatric: Normal judgment and insight. Normal mood. Musculoskeletal:No digital cyanosis Skin:No Rash, warm and dry Wounds:N/A I have personally reviewed following labs and imaging studies  LABORATORY DATA: CBC: Recent Labs  Lab 09/14/17 1402 09/15/17 0413  WBC 10.2 7.8  NEUTROABS 6.8  --   HGB 14.8 12.5*  HCT 42.1 36.9*  MCV 82.1  83.5  PLT 354 324    Basic Metabolic Panel: Recent Labs  Lab 09/14/17 1402 09/15/17 0413 09/16/17 0538  NA 129* 135 138  K 2.5* 3.3* 3.7  CL 97* 110 115*  CO2 20* 20* 20*  GLUCOSE 112* 111* 106*  BUN 25* 15 9  CREATININE 1.81* 1.16 1.04  CALCIUM 8.5* 8.1* 8.1*  MG 1.5* 2.4 1.7    GFR: Estimated Creatinine Clearance: 63.6 mL/min (by C-G formula based on SCr of 1.04 mg/dL).  Liver Function Tests: Recent Labs  Lab  09/14/17 1402  AST 20  ALT 19  ALKPHOS 95  BILITOT 0.9  PROT 7.1  ALBUMIN 3.1*   No results for input(s): LIPASE, AMYLASE in the last 168 hours. No results for input(s): AMMONIA in the last 168 hours.  Coagulation Profile: No results for input(s): INR, PROTIME in the last 168 hours.  Cardiac Enzymes: No results for input(s): CKTOTAL, CKMB, CKMBINDEX, TROPONINI in the last 168 hours.  BNP (last 3 results) No results for input(s): PROBNP in the last 8760 hours.  HbA1C: No results for input(s): HGBA1C in the last 72 hours.  CBG: No results for input(s): GLUCAP in the last 168 hours.  Lipid Profile: No results for input(s): CHOL, HDL, LDLCALC, TRIG, CHOLHDL, LDLDIRECT in the last 72 hours.  Thyroid Function Tests: No results for input(s): TSH, T4TOTAL, FREET4, T3FREE, THYROIDAB in the last 72 hours.  Anemia Panel: No results for input(s): VITAMINB12, FOLATE, FERRITIN, TIBC, IRON, RETICCTPCT in the last 72 hours.  Urine analysis:    Component Value Date/Time   COLORURINE YELLOW 09/14/2017 1855   APPEARANCEUR CLEAR 09/14/2017 1855   LABSPEC 1.008 09/14/2017 1855   PHURINE 6.0 09/14/2017 1855   GLUCOSEU NEGATIVE 09/14/2017 1855   HGBUR NEGATIVE 09/14/2017 1855   BILIRUBINUR NEGATIVE 09/14/2017 1855   KETONESUR NEGATIVE 09/14/2017 1855   PROTEINUR NEGATIVE 09/14/2017 1855   NITRITE NEGATIVE 09/14/2017 1855   LEUKOCYTESUR NEGATIVE 09/14/2017 1855    Sepsis Labs: Lactic Acid, Venous    Component Value Date/Time   LATICACIDVEN 0.61 09/14/2017 1800    MICROBIOLOGY: Recent Results (from the past 240 hour(s))  C difficile quick scan w PCR reflex     Status: None   Collection Time: 09/15/17  4:13 AM  Result Value Ref Range Status   C Diff antigen NEGATIVE NEGATIVE Final   C Diff toxin NEGATIVE NEGATIVE Final   C Diff interpretation No C. difficile detected.  Final    RADIOLOGY STUDIES/RESULTS: Ct Abdomen Pelvis Wo Contrast  Result Date: 09/14/2017 CLINICAL  DATA:  Patient presents c/o weakness and having no energy. Patient concerned his hemoglobin could be low. C/o abdominal pain and nausea. Pt presents lethargic. Hx of crohn's and has colostomy bag. Denies chest pain. EXAM: CT ABDOMEN AND PELVIS WITHOUT CONTRAST TECHNIQUE: Multidetector CT imaging of the abdomen and pelvis was performed following the standard protocol without IV contrast. COMPARISON:  05/19/2017 FINDINGS: Lower chest: Heart is normal size. There are emphysematous blebs in the anterior lung bases. Mild dependent subsegmental atelectasis is noted in the left lower lobe. No acute findings. Hepatobiliary: Liver is unremarkable. Gallbladder not visualize, presumed surgically absent. Common bile duct measures a maximum of 9 mm in diameter with distal tapering, unchanged from the prior CT. Pancreas: Unremarkable. No pancreatic ductal dilatation or surrounding inflammatory changes. Spleen: Normal in size without focal abnormality. Adrenals/Urinary Tract: Adrenal glands are unremarkable. Kidneys are normal, without renal calculi, focal lesion, or hydronephrosis. Bladder is unremarkable. Stomach/Bowel: In the left mid to  upper abdomen, there is a dilated bowel loop which appears to be the colon, dilated to 6.4 cm. There is an associated anastomosis staple line. This connects to a prominent loop of thick walled bowel, that is likely ileum, which extends into the pelvis and from there into the left mid to upper abdomen. Wall measures up to 9 mm in thickness. There is mild associated hazy opacity in the mesentery and several prominent to mildly enlarged mesenteric lymph nodes, largest measuring 1 cm short axis. No other bowel inflammation. An anastomosis staple line is noted at the rectosigmoid junction. Vascular/Lymphatic: No other prominent or enlarged lymph nodes. No significant vascular abnormality. Reproductive: Unremarkable. Other: Stable appearance of the left lower quadrant colostomy. No ascites.  Musculoskeletal: No fracture or acute finding. No osteoblastic or osteolytic lesions. Disk degenerative changes at L5-S1. IMPRESSION: 1. Thick walled loop of bowel with mild associated mesenteric inflammation. This appears to be ileum, which extends from the left mid abdomen anastomosis into the pelvis and right lower quadrant. This was present to a similar degree on the prior CT. Active Crohn's disease is suspected. 2. No other acute abnormalities. No evidence of an abscess or fistula. Electronically Signed   By: Lajean Manes M.D.   On: 09/14/2017 19:29   Dg Chest 2 View  Result Date: 09/14/2017 CLINICAL DATA:  Shortness of breath EXAM: CHEST  2 VIEW COMPARISON:  None FINDINGS: The lungs are hyperinflated likely secondary to COPD. There is no focal consolidation. There is no pleural effusion or pneumothorax. The heart and mediastinal contours are unremarkable. The osseous structures are unremarkable. IMPRESSION: No active cardiopulmonary disease. Emphysema (ICD10-J43.9). Electronically Signed   By: Kathreen Devoid   On: 09/14/2017 14:35     LOS: 2 days   Oren Binet, MD  [facility] Pager:[phone]  If 7PM-7AM, please contact night-coverage [email or web address] Password [facility] 09/16/2017, 7:55 AM

## 2017-09-16 NOTE — Progress Notes (Addendum)
[place] [facility] Progress Note  CC:  Crohn's disease  Subjective:  Feels better.  Having a lot of ostomy output, which he thinks is from the IV fluids.  Objective:  Vital signs in last 24 hours: Temp:  [98 F (36.7 C)-98.7 F (37.1 C)] 98.3 F (36.8 C) (01/17 0606) Pulse Rate:  [60-68] 60 (01/17 0606) Resp:  [10-19] 16 (01/17 0606) BP: (101-133)/(59-88) 115/66 (01/17 0606) SpO2:  [100 %] 100 % (01/17 0606) Weight:  [125 lb 0 oz (56.7 kg)] 125 lb 0 oz (56.7 kg) (01/16 1502)   General: Alert, Well-developed, in NAD Heart:  Regular rate and rhythm; no murmurs Pulm:  CTAB.  No increased WOB. Abdomen:  Soft, non-distended.  BS present.  Minimal TTP.  Ileostomy noted.  Mid-line surgical scar that is well-healed. Extremities:  Without edema. Neurologic:  Alert and oriented x 4;  grossly normal neurologically. Psych:  Alert and cooperative. Normal mood and affect.  Intake/Output from previous day: 01/16 0701 - 01/17 0700 In: 4662.5 [P.O.:1080; I.V.:3582.5] Out: 8 [Urine:4; Stool:4]  Lab Results: Recent Labs    09/14/17 1402 09/15/17 0413  WBC 10.2 7.8  HGB 14.8 12.5*  HCT 42.1 36.9*  PLT 354 308   BMET Recent Labs    09/14/17 1402 09/15/17 0413 09/16/17 0538  NA 129* 135 138  K 2.5* 3.3* 3.7  CL 97* 110 115*  CO2 20* 20* 20*  GLUCOSE 112* 111* 106*  BUN 25* 15 9  CREATININE 1.81* 1.16 1.04  CALCIUM 8.5* 8.1* 8.1*   LFT Recent Labs    09/14/17 1402  PROT 7.1  ALBUMIN 3.1*  AST 20  ALT 19  ALKPHOS 95  BILITOT 0.9   Ct Abdomen Pelvis Wo Contrast  Result Date: 09/14/2017 CLINICAL DATA:  Patient presents c/o weakness and having no energy. Patient concerned his hemoglobin could be low. C/o abdominal pain and nausea. Pt presents lethargic. Hx of crohn's and has colostomy bag. Denies chest pain. EXAM: CT ABDOMEN AND PELVIS WITHOUT CONTRAST TECHNIQUE: Multidetector CT imaging of the abdomen and pelvis was performed following the standard protocol  without IV contrast. COMPARISON:  05/19/2017 FINDINGS: Lower chest: Heart is normal size. There are emphysematous blebs in the anterior lung bases. Mild dependent subsegmental atelectasis is noted in the left lower lobe. No acute findings. Hepatobiliary: Liver is unremarkable. Gallbladder not visualize, presumed surgically absent. Common bile duct measures a maximum of 9 mm in diameter with distal tapering, unchanged from the prior CT. Pancreas: Unremarkable. No pancreatic ductal dilatation or surrounding inflammatory changes. Spleen: Normal in size without focal abnormality. Adrenals/Urinary Tract: Adrenal glands are unremarkable. Kidneys are normal, without renal calculi, focal lesion, or hydronephrosis. Bladder is unremarkable. Stomach/Bowel: In the left mid to upper abdomen, there is a dilated bowel loop which appears to be the colon, dilated to 6.4 cm. There is an associated anastomosis staple line. This connects to a prominent loop of thick walled bowel, that is likely ileum, which extends into the pelvis and from there into the left mid to upper abdomen. Wall measures up to 9 mm in thickness. There is mild associated hazy opacity in the mesentery and several prominent to mildly enlarged mesenteric lymph nodes, largest measuring 1 cm short axis. No other bowel inflammation. An anastomosis staple line is noted at the rectosigmoid junction. Vascular/Lymphatic: No other prominent or enlarged lymph nodes. No significant vascular abnormality. Reproductive: Unremarkable. Other: Stable appearance of the left lower quadrant colostomy. No ascites. Musculoskeletal: No fracture or acute finding.  No osteoblastic or osteolytic lesions. Disk degenerative changes at L5-S1. IMPRESSION: 1. Thick walled loop of bowel with mild associated mesenteric inflammation. This appears to be ileum, which extends from the left mid abdomen anastomosis into the pelvis and right lower quadrant. This was present to a similar degree on the  prior CT. Active Crohn's disease is suspected. 2. No other acute abnormalities. No evidence of an abscess or fistula. Electronically Signed   By: Lajean Manes M.D.   On: 09/14/2017 19:29   Dg Chest 2 View  Result Date: 09/14/2017 CLINICAL DATA:  Shortness of breath EXAM: CHEST  2 VIEW COMPARISON:  None FINDINGS: The lungs are hyperinflated likely secondary to COPD. There is no focal consolidation. There is no pleural effusion or pneumothorax. The heart and mediastinal contours are unremarkable. The osseous structures are unremarkable. IMPRESSION: No active cardiopulmonary disease. Emphysema (ICD10-J43.9). 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  2 weeks of increased abdominal pain, anorexia, nausea, diminished frequency of BMs.  ? Flare of Crohn's with ileitis vs stricture.  Interestingly his current, unfortunately noncontrast, CT is not much different than it was in September, 4 months ago., however, his symptoms have only been acutely flaring for the past 2 weeks.  ? Active disease vs stricture.  CRP normal and sed rate minimally elevated.  *AKI due to dehydration.Marland Kitchen  Resolved with IVF.      LOS: 2 days   Laban Emperor. Zehr  09/16/2017, 9:27 AM  Pager number [phone]   Attending physician's note   I have taken an interval history, reviewed the chart and examined the patient. I agree with the Advanced Practitioner's note, impression and recommendations.  Feels better, he is having increased ostomy output. On prednisone 40 mg daily Plan for ileoscopy with biopsies tomorrow Unclear small bowel stricture secondary to Crohn's versus anastomotic If no active inflammation on biopsies, we can consider tapering off steroids rapidly The risks and benefits as well as alternatives of endoscopic procedure(s) have been discussed and  reviewed. All questions answered. The patient agrees to proceed.  Damaris Hippo, MD [phone] Mon-Fri 8a-5p [phone] after 5p, weekends, holidays

## 2017-09-16 NOTE — Care Management Note (Signed)
Case Management Note  Patient Details  Name: Gregory [name]. MRN: [id number] Date of Birth: 1961-04-02  Subjective/Objective:56 y/o m admitted w/Crohn's. From home.                    Action/Plan:d/c home.   Expected Discharge Date:  (unknown)               Expected Discharge Plan:  Home/Self Care  In-House Referral:     Discharge planning Services  CM Consult  Post Acute Care Choice:    Choice offered to:     DME Arranged:    DME Agency:     HH Arranged:    HH Agency:     Status of Service:  In process, will continue to follow  If discussed at Long Length of Stay Meetings, dates discussed:    Additional Comments:  Dessa Phi, RN 09/16/2017, 11:57 AM

## 2017-09-16 NOTE — Progress Notes (Signed)
Initial Nutrition Assessment  DOCUMENTATION CODES:   Severe malnutrition in context of chronic illness, Underweight  INTERVENTION:   Once diet is advanced will order  [employer] mixed with lactose free milk TID between meals  Discussed appropriate foods/beverages for crohn's disease  NUTRITION DIAGNOSIS:   Severe Malnutrition related to chronic illness(Crohn's disease) as evidenced by severe fat depletion, severe muscle depletion.  GOAL:   Patient will meet greater than or equal to 90% of their needs  MONITOR:   PO intake, Supplement acceptance, Diet advancement  ASSESSMENT:   Pt with PMH of Crohn's disease s/p numerous SBR and ileostomy on Entyvio admitted with abd pain and increasing output from ostomy.    Per pt he has good days and bad days. Pt's usual weight was 140 lb about 4 years ago but has lost down to 120's and has stayed there unable to gain any weight as he continues to have acute illnesses that do not allow him to gain weight.  Per pt he only eats certain foods that will agree with him. He tends to eat 6-8 small meals per day Eggs for breakfast He will eat a sandwich with one slice of meat and cheese around 5 times per day Later he will cook peeled potatoes with butter or mac and cheese. He eats chicken sometimes. Vegetables would be canned and cooked well. He does not have any teeth.  If he eats pizza he has increased output. He drinks a lot of water when he feels well but will also drink Gatorade and regular soda (which he knows he should not drink) He has an intolerance to boost, ensure, etc He does drink vanilla carnation instant breakfast, usually one per day. He cannot afford to drink more.   Plan for colonoscopy 1/18.   Medications reviewed and include: NS with 20 mEq KCl/L @ 100 ml/hr Labs reviewed Pt positive 6.9 L since admission  NUTRITION - FOCUSED PHYSICAL EXAM:    Most Recent Value  Orbital Region  Severe  depletion  Upper Arm Region  Severe depletion  Thoracic and Lumbar Region  Severe depletion  Buccal Region  Severe depletion  Temple Region  Severe depletion  Clavicle Bone Region  Severe depletion  Clavicle and Acromion Bone Region  Severe depletion  Scapular Bone Region  Severe depletion  Dorsal Hand  Severe depletion  Patellar Region  Severe depletion  Anterior Thigh Region  Severe depletion  Posterior Calf Region  Severe depletion  Edema (RD Assessment)  None  Hair  Reviewed  Eyes  Reviewed  Mouth  Reviewed  Skin  Reviewed  Nails  Reviewed [pale nail beds]       Diet Order:  Diet clear liquid Room service appropriate? Yes; Fluid consistency: Thin Diet NPO time specified  EDUCATION NEEDS:   Education needs have been addressed  Skin:  Skin Assessment: Reviewed RN Assessment  Last BM:  unknown  Height:   Ht Readings from Last 1 Encounters:  09/15/17 _0  (1.803 m)    Weight:   Wt Readings from Last 1 Encounters:  09/15/17 125 lb 0 oz (56.7 kg)    Ideal Body Weight:  78.1 kg  BMI:  Body mass index is 17.43 kg/m.  Estimated Nutritional Needs:   Kcal:  1900-2100  Protein:  90-105 grams  Fluid:  > 1.8 L/day  Maylon Peppers RD, LDN, CNSC [phone] Pager [phone] After Hours Pager

## 2017-09-17 ENCOUNTER — Inpatient Hospital Stay (HOSPITAL_COMMUNITY): Payer: Medicare Other | Admitting: Anesthesiology

## 2017-09-17 ENCOUNTER — Encounter (HOSPITAL_COMMUNITY)
Admission: EM | Disposition: A | Discharge status: Home / Self Care | Payer: Self-pay | Source: Home / Self Care | Attending: Internal Medicine

## 2017-09-17 ENCOUNTER — Other Ambulatory Visit: Payer: Self-pay

## 2017-09-17 ENCOUNTER — Encounter (HOSPITAL_COMMUNITY): Payer: Self-pay | Admitting: *Deleted

## 2017-09-17 DIAGNOSIS — K523 Indeterminate colitis: Secondary | ICD-10-CM

## 2017-09-17 DIAGNOSIS — E860 Dehydration: Secondary | ICD-10-CM

## 2017-09-17 HISTORY — PX: COLONOSCOPY WITH PROPOFOL: SHX5780

## 2017-09-17 LAB — BASIC METABOLIC PANEL
Anion gap: 3 — ABNORMAL LOW (ref 5–15)
BUN: 5 mg/dL — AB (ref 6–20)
CHLORIDE: 121 mmol/L — AB (ref 101–111)
CO2: 18 mmol/L — AB (ref 22–32)
CREATININE: 1.04 mg/dL (ref 0.61–1.24)
Calcium: 8.6 mg/dL — ABNORMAL LOW (ref 8.9–10.3)
GFR calc Af Amer: >60 mL/min (ref >60)
GFR calc non Af Amer: >60 mL/min (ref >60)
GLUCOSE: 90 mg/dL (ref 65–99)
POTASSIUM: 4.3 mmol/L (ref 3.5–5.1)
SODIUM: 142 mmol/L (ref 135–145)

## 2017-09-17 SURGERY — COLONOSCOPY WITH PROPOFOL
Anesthesia: General

## 2017-09-17 MED ORDER — LACTATED RINGERS IV SOLN
INTRAVENOUS | Status: DC
  Administered 2017-09-17: 11:00:00 via INTRAVENOUS

## 2017-09-17 MED ORDER — PROPOFOL 10 MG/ML IV BOLUS
INTRAVENOUS | Status: AC
  Filled 2017-09-17: qty 60

## 2017-09-17 MED ORDER — PROPOFOL 10 MG/ML IV BOLUS
INTRAVENOUS | Status: AC
  Filled 2017-09-17: qty 20

## 2017-09-17 MED ORDER — PROPOFOL 500 MG/50ML IV EMUL
INTRAVENOUS | Status: DC | PRN
  Administered 2017-09-17: 110 ug/kg/min via INTRAVENOUS

## 2017-09-17 MED ORDER — PROPOFOL 10 MG/ML IV BOLUS
INTRAVENOUS | Status: DC | PRN
  Administered 2017-09-17: 20 mg via INTRAVENOUS
  Administered 2017-09-17 (×2): 30 mg via INTRAVENOUS

## 2017-09-17 MED ORDER — DIPHENOXYLATE-ATROPINE 2.5-0.025 MG PO TABS
1.0000 | ORAL_TABLET | Freq: Four times a day (QID) | ORAL | Status: DC
  Administered 2017-09-17 – 2017-09-18 (×5): 1 via ORAL
  Filled 2017-09-17 (×5): qty 1

## 2017-09-17 SURGICAL SUPPLY — 21 items

## 2017-09-17 NOTE — Progress Notes (Addendum)
PROGRESS NOTE        PATIENT DETAILS Name: Gregory [name]. Age: 57 y.o. Sex: male Date of Birth: 11-17-1960 Admit Date: 09/14/2017 Admitting Physician Norval Morton, MD ZOX:[id number], Langley Adie, MD  Brief Narrative: Patient is a 57 y.o. male  with long-standing history of Crohn's disease status post numerous small bowel surgeries-has ileostomy in place-on Entyvio (vedolizumab) as outpatient presented to the hospital with abdominal pain, worsening output from his ostomy.  Thought to have a Crohn's disease flare and admitted to the hospitalist service.See below for further details  Subjective: No major issues overnight-or awaiting endoscopic evaluation.   Assessment/Plan: Suspected Crohn's disease flare: Overall improved with initiation of prednisone-C. difficile PCR negative-GI following with plans for endoscopic evaluation via ileostomy today.  Vedolizumab remains on hold-we will await further recommendations from GI.  Nausea/vomiting: Resolved with supportive care.  Hypokalemia: Repleted-likely secondary to GI loss.  Awaiting a.m. labs.  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  Acute kidney injury: Resolved-likely hemodynamically mediated acute kidney injury in the setting of GI loss.    DVT Prophylaxis: Prophylactic Lovenox   Code Status: Full code   Family Communication: None at bedside  Disposition Plan: Remain inpatient- home later today or tomorrow morning.  Antimicrobial agents: Anti-infectives (From admission, onward)   None      Procedures: None  CONSULTS:  GI  Time spent: 25 minutes-Greater than 50% of this time was spent in counseling, explanation of diagnosis, planning of further management, and coordination of care.  MEDICATIONS: Scheduled Meds: . enoxaparin (LOVENOX) injection  40 mg Subcutaneous QHS  .  famotidine  20 mg Oral Daily  . predniSONE  40 mg Oral Q breakfast   Continuous Infusions: . sodium chloride    . 0.9 % NaCl with KCl 20 mEq / L 100 mL/hr at 09/16/17 1229   PRN Meds:.acetaminophen **OR** acetaminophen, albuterol, morphine injection, ondansetron **OR** ondansetron (ZOFRAN) IV   PHYSICAL EXAM: Vital signs: Vitals:   09/16/17 0606 09/16/17 1305 09/16/17 2217 09/17/17 0532  BP: 115/66 109/66 112/69 113/68  Pulse: 60 68 84 63  Resp: 16 16 17 15   Temp: 98.3 F (36.8 C) 98.2 F (36.8 C) 98 F (36.7 C) (!) 97.4 F (36.3 C)  TempSrc: Oral Oral Oral Oral  SpO2: 100% 100% 100% 100%  Weight:      Height:       Filed Weights   09/15/17 1502  Weight: 56.7 kg (125 lb 0 oz)   Body mass index is 17.43 kg/m.   General appearance :Awake, alert, not in any distress.  Eyes:, pupils equally reactive to light and accomodation,no scleral icterus. HEENT: Atraumatic and Normocephalic Neck: supple, no JVD. Resp:Good air entry bilaterally, no rales or rhonchi CVS: S1 S2 regular, no murmurs.  GI: Bowel sounds present, Non tender and not distended with no gaurding, rigidity or rebound.  Ostomy in place. Extremities: B/L Lower Ext shows no edema, both legs are warm to touch Neurology:  speech clear,Non focal, sensation is grossly intact. Psychiatric: Normal judgment and insight. Normal mood. Musculoskeletal:No digital cyanosis Skin:No Rash, warm and dry Wounds:N/A  I have personally reviewed following labs and imaging studies  LABORATORY DATA: CBC: Recent Labs  Lab 09/14/17 1402 09/15/17 0413  WBC 10.2 7.8  NEUTROABS 6.8  --  HGB 14.8 12.5*  HCT 42.1 36.9*  MCV 82.1 83.5  PLT 354 419    Basic Metabolic Panel: Recent Labs  Lab 09/14/17 1402 09/15/17 0413 09/16/17 0538  NA 129* 135 138  K 2.5* 3.3* 3.7  CL 97* 110 115*  CO2 20* 20* 20*  GLUCOSE 112* 111* 106*  BUN 25* 15 9  CREATININE 1.81* 1.16 1.04  CALCIUM 8.5* 8.1* 8.1*  MG 1.5* 2.4 1.7     GFR: Estimated Creatinine Clearance: 63.6 mL/min (by C-G formula based on SCr of 1.04 mg/dL).  Liver Function Tests: Recent Labs  Lab 09/14/17 1402  AST 20  ALT 19  ALKPHOS 95  BILITOT 0.9  PROT 7.1  ALBUMIN 3.1*   No results for input(s): LIPASE, AMYLASE in the last 168 hours. No results for input(s): AMMONIA in the last 168 hours.  Coagulation Profile: No results for input(s): INR, PROTIME in the last 168 hours.  Cardiac Enzymes: No results for input(s): CKTOTAL, CKMB, CKMBINDEX, TROPONINI in the last 168 hours.  BNP (last 3 results) No results for input(s): PROBNP in the last 8760 hours.  HbA1C: No results for input(s): HGBA1C in the last 72 hours.  CBG: No results for input(s): GLUCAP in the last 168 hours.  Lipid Profile: No results for input(s): CHOL, HDL, LDLCALC, TRIG, CHOLHDL, LDLDIRECT in the last 72 hours.  Thyroid Function Tests: No results for input(s): TSH, T4TOTAL, FREET4, T3FREE, THYROIDAB in the last 72 hours.  Anemia Panel: No results for input(s): VITAMINB12, FOLATE, FERRITIN, TIBC, IRON, RETICCTPCT in the last 72 hours.  Urine analysis:    Component Value Date/Time   COLORURINE YELLOW 09/14/2017 1855   APPEARANCEUR CLEAR 09/14/2017 1855   LABSPEC 1.008 09/14/2017 1855   PHURINE 6.0 09/14/2017 1855   GLUCOSEU NEGATIVE 09/14/2017 1855   HGBUR NEGATIVE 09/14/2017 1855   BILIRUBINUR NEGATIVE 09/14/2017 1855   KETONESUR NEGATIVE 09/14/2017 1855   PROTEINUR NEGATIVE 09/14/2017 1855   NITRITE NEGATIVE 09/14/2017 1855   LEUKOCYTESUR NEGATIVE 09/14/2017 1855    Sepsis Labs: Lactic Acid, Venous    Component Value Date/Time   LATICACIDVEN 0.61 09/14/2017 1800    MICROBIOLOGY: Recent Results (from the past 240 hour(s))  C difficile quick scan w PCR reflex     Status: None   Collection Time: 09/15/17  4:13 AM  Result Value Ref Range Status   C Diff antigen NEGATIVE NEGATIVE Final   C Diff toxin NEGATIVE NEGATIVE Final   C Diff  interpretation No C. difficile detected.  Final    RADIOLOGY STUDIES/RESULTS: Ct Abdomen Pelvis Wo Contrast  Result Date: 09/14/2017 CLINICAL DATA:  Patient presents c/o weakness and having no energy. Patient concerned his hemoglobin could be low. C/o abdominal pain and nausea. Pt presents lethargic. Hx of crohn's and has colostomy bag. Denies chest pain. EXAM: CT ABDOMEN AND PELVIS WITHOUT CONTRAST TECHNIQUE: Multidetector CT imaging of the abdomen and pelvis was performed following the standard protocol without IV contrast. COMPARISON:  05/19/2017 FINDINGS: Lower chest: Heart is normal size. There are emphysematous blebs in the anterior lung bases. Mild dependent subsegmental atelectasis is noted in the left lower lobe. No acute findings. Hepatobiliary: Liver is unremarkable. Gallbladder not visualize, presumed surgically absent. Common bile duct measures a maximum of 9 mm in diameter with distal tapering, unchanged from the prior CT. Pancreas: Unremarkable. No pancreatic ductal dilatation or surrounding inflammatory changes. Spleen: Normal in size without focal abnormality. Adrenals/Urinary Tract: Adrenal glands are unremarkable. Kidneys are normal, without renal calculi, focal lesion, or  hydronephrosis. Bladder is unremarkable. Stomach/Bowel: In the left mid to upper abdomen, there is a dilated bowel loop which appears to be the colon, dilated to 6.4 cm. There is an associated anastomosis staple line. This connects to a prominent loop of thick walled bowel, that is likely ileum, which extends into the pelvis and from there into the left mid to upper abdomen. Wall measures up to 9 mm in thickness. There is mild associated hazy opacity in the mesentery and several prominent to mildly enlarged mesenteric lymph nodes, largest measuring 1 cm short axis. No other bowel inflammation. An anastomosis staple line is noted at the rectosigmoid junction. Vascular/Lymphatic: No other prominent or enlarged lymph nodes.  No significant vascular abnormality. Reproductive: Unremarkable. Other: Stable appearance of the left lower quadrant colostomy. No ascites. Musculoskeletal: No fracture or acute finding. No osteoblastic or osteolytic lesions. Disk degenerative changes at L5-S1. IMPRESSION: 1. Thick walled loop of bowel with mild associated mesenteric inflammation. This appears to be ileum, which extends from the left mid abdomen anastomosis into the pelvis and right lower quadrant. This was present to a similar degree on the prior CT. Active Crohn's disease is suspected. 2. No other acute abnormalities. No evidence of an abscess or fistula. Electronically Signed   By: Lajean Manes M.D.   On: 09/14/2017 19:29   Dg Chest 2 View  Result Date: 09/14/2017 CLINICAL DATA:  Shortness of breath EXAM: CHEST  2 VIEW COMPARISON:  None FINDINGS: The lungs are hyperinflated likely secondary to COPD. There is no focal consolidation. There is no pleural effusion or pneumothorax. The heart and mediastinal contours are unremarkable. The osseous structures are unremarkable. IMPRESSION: No active cardiopulmonary disease. Emphysema (ICD10-J43.9). Electronically Signed   By: Kathreen Devoid   On: 09/14/2017 14:35     LOS: 3 days   Oren Binet, MD  [facility] Pager:[phone]  If 7PM-7AM, please contact night-coverage [email or web address] Password [email or web address] 09/17/2017, 8:11 AM

## 2017-09-17 NOTE — Care Management Important Message (Signed)
Important Message  Patient Details  Name: Gregory [name]. MRN: [id number] Date of Birth: 12/20/1960   Medicare Important Message Given:  Yes    Kerin Salen 09/17/2017, 10:31 AMImportant Message  Patient Details  Name: Gregory [name]. MRN: [id number] Date of Birth: 1961/06/22   Medicare Important Message Given:  Yes    Kerin Salen 09/17/2017, 10:30 AM

## 2017-09-17 NOTE — Op Note (Addendum)
[facility] Patient Name: Gregory [name] Procedure Date: 09/17/2017 MRN: [id number] Attending MD: [name] , MD Date of Birth: 11/12/1960 CSN: [id number] Age: 57 Admit Type: Inpatient Procedure:                Colonoscopy via Stoma with Endoscopy of Hartmann                            Pouch Indications:              Inflammatory bowel disease, Disease activity                            assessment of Crohn's disease of the small bowel,                            Assess therapeutic response to therapy of Crohn's                            disease of the small bowel Providers:                [name], MD, Presley Raddle, RN, Elna Breslow, RN, Danford Bad, Technician, Courtney Heys.                            [place], CRNA Referring MD:              Medicines:                Monitored Anesthesia Care Complications:            No immediate complications. Estimated Blood Loss:     Estimated blood loss was minimal. Procedure:                After obtaining informed consent, the endoscope was                            passed under direct vision. Throughout the                            procedure, the patient's blood pressure, pulse, and                            oxygen saturations were monitored continuously. The                            EC-[id number] ([id number]) scope was introduced through                            the descending colostomy and advanced to the the                            ileocolonic anastomosis. After obtaining informed  consent, the endoscope was passed under direct                            vision. Throughout the procedure, the patient's                            blood pressure, pulse, and oxygen saturations were                            monitored continuously.The procedure was performed                            without difficulty. The patient tolerated the              procedure well. The quality of the bowel                            preparation was good. Scope In: 12:25:28 PM Scope Out: 12:38:02 PM Total Procedure Duration: 0 hours 12 minutes 34 seconds  Findings:      Patient is status-post subtotal colectomy with a functional end-to-end       ileo-colonic anastomosis.      The visualized portion colon appeared normal.      Patchy inflammation, graded as Rutgeerts Score i4 (diffuse inflammation       with large deep lesions and/or narrowing) and characterized by       congestion (edema), erosions, erythema, friability, granularity, mucus,       aphthous ulcerations and serpentine ulcerations was found in the entire       examined ileum. Biopsies were taken with a cold forceps for histology.       No visible stricture in the visualized portion of ileum ~20 from       ileocolonic anastomosis or colon Impression:               - Colostomy with ileocolonic anastomosis.                           - The colon appeared normal.                           - Crohn's disease with ileitis. Biopsied. Recommendation:           - Resume mechanical soft diet.                           - Continue present medications.                           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 All rights reserved. The codes documented in this report are preliminary and upon coder review may  be  revised to meet current compliance requirements. [name], MD 09/17/2017 12:54:51 PM This report has been signed electronically. Number of Addenda: 0

## 2017-09-17 NOTE — Anesthesia Preprocedure Evaluation (Addendum)
Anesthesia Evaluation  Patient identified by MRN, date of birth, ID band Patient awake    Reviewed: Allergy & Precautions, NPO status , Patient's Chart, lab work & pertinent test results  Airway Mallampati: II  TM Distance: >3 FB Neck ROM: Full    Dental  (+) Edentulous Lower, Edentulous Upper   Pulmonary former smoker,    Pulmonary exam normal breath sounds clear to auscultation       Cardiovascular negative cardio ROS Normal cardiovascular exam Rhythm:Regular Rate:Normal  Hx endocarditis   Neuro/Psych PSYCHIATRIC DISORDERS Depression negative neurological ROS     GI/Hepatic Neg liver ROS, GERD  Medicated,Crohn's disease, short gut syndrome   Endo/Other  negative endocrine ROS  Renal/GU negative Renal ROS  negative genitourinary   Musculoskeletal negative musculoskeletal ROS (+)   Abdominal   Peds negative pediatric ROS (+)  Hematology  (+) anemia ,   Anesthesia Other Findings   Reproductive/Obstetrics negative OB ROS                             Anesthesia Physical  Anesthesia Plan  ASA: II  Anesthesia Plan: MAC   Post-op Pain Management:    Induction: Intravenous  PONV Risk Score and Plan: 3 and Ondansetron, Dexamethasone, Midazolam and Treatment may vary due to age or medical condition  Airway Management Planned: Nasal Cannula, Natural Airway and Simple Face Mask  Additional Equipment: None  Intra-op Plan:   Post-operative Plan: Extubation in OR  Informed Consent: I have reviewed the patients History and Physical, chart, labs and discussed the procedure including the risks, benefits and alternatives for the proposed anesthesia with the patient or authorized representative who has indicated his/her understanding and acceptance.   Dental advisory given  Plan Discussed with: CRNA and Anesthesiologist  Anesthesia Plan Comments:       Anesthesia Quick Evaluation

## 2017-09-17 NOTE — Transfer of Care (Signed)
Immediate Anesthesia Transfer of Care Note  Patient: Gregory [name].  Procedure(s) Performed: COLONOSCOPY WITH PROPOFOL (N/A )  Patient Location: PACU and Endoscopy Unit  Anesthesia Type:MAC  Level of Consciousness: awake, alert , oriented and patient cooperative  Airway & Oxygen Therapy: Patient Spontanous Breathing and Patient connected to face mask oxygen  Post-op Assessment: Report given to RN, Post -op Vital signs reviewed and stable and Patient moving all extremities  Post vital signs: Reviewed and stable  Last Vitals:  Vitals:   09/17/17 0532 09/17/17 1105  BP: 113/68 110/66  Pulse: 63 (!) 59  Resp: 15 10  Temp: (!) 36.3 C 36.8 C  SpO2: 100% 96%    Last Pain:  Vitals:   09/17/17 1105  TempSrc: Oral  PainSc:       Patients Stated Pain Goal: 5 (31/59/45 8592)  Complications: No apparent anesthesia complications

## 2017-09-17 NOTE — Anesthesia Postprocedure Evaluation (Signed)
Anesthesia Post Note  Patient: Gregory [name].  Procedure(s) Performed: COLONOSCOPY WITH PROPOFOL (N/A )     Patient location during evaluation: PACU Anesthesia Type: General Level of consciousness: awake and alert Pain management: pain level controlled Vital Signs Assessment: post-procedure vital signs reviewed and stable Respiratory status: spontaneous breathing, nonlabored ventilation, respiratory function stable and patient connected to nasal cannula oxygen Cardiovascular status: blood pressure returned to baseline and stable Postop Assessment: no apparent nausea or vomiting Anesthetic complications: no    Last Vitals:  Vitals:   09/17/17 1305 09/17/17 1415  BP:  124/74  Pulse: (!) 57 64  Resp: 13 13  Temp:  36.8 C  SpO2: 100% 100%    Last Pain:  Vitals:   09/17/17 1442  TempSrc:   PainSc: 7                  Kayon Dozier

## 2017-09-17 NOTE — Interval H&P Note (Signed)
History and Physical Interval Note:  09/17/2017 12:20 PM  Gregory [name].  has presented today for surgery, with the diagnosis of Crohn's disease  The various methods of treatment have been discussed with the patient and family. After consideration of risks, benefits and other options for treatment, the patient has consented to  Procedure(s) with comments: COLONOSCOPY WITH PROPOFOL (N/A) - Through ileostomy. as a surgical intervention .  The patient's history has been reviewed, patient examined, no change in status, stable for surgery.  I have reviewed the patient's chart and labs.  Questions were answered to the patient's satisfaction.     Kavitha Nandigam

## 2017-09-17 NOTE — Interval H&P Note (Signed)
History and Physical Interval Note:  09/17/2017 12:20 PM  Gregory [name].  has presented today for surgery, with the diagnosis of Crohn's disease  The various methods of treatment have been discussed with the patient and family. After consideration of risks, benefits and other options for treatment, the patient has consented to  Procedure(s) with comments: COLONOSCOPY WITH PROPOFOL (N/A) - Through ileostomy. as a surgical intervention .  The patient's history has been reviewed, patient examined, no change in status, stable for surgery.  I have reviewed the patient's chart and labs.  Questions were answered to the patient's satisfaction.     Gregory [name]

## 2017-09-18 LAB — BASIC METABOLIC PANEL
ANION GAP: 3 — AB (ref 5–15)
BUN: 8 mg/dL (ref 6–20)
CHLORIDE: 115 mmol/L — AB (ref 101–111)
CO2: 19 mmol/L — ABNORMAL LOW (ref 22–32)
CREATININE: 0.99 mg/dL (ref 0.61–1.24)
Calcium: 7.9 mg/dL — ABNORMAL LOW (ref 8.9–10.3)
GFR calc non Af Amer: >60 mL/min (ref >60)
Glucose, Bld: 113 mg/dL — ABNORMAL HIGH (ref 65–99)
POTASSIUM: 4.2 mmol/L (ref 3.5–5.1)
Sodium: 137 mmol/L (ref 135–145)

## 2017-09-18 LAB — CBC
HCT: 32.6 % — ABNORMAL LOW (ref 39.0–52.0)
HEMOGLOBIN: 10.4 g/dL — AB (ref 13.0–17.0)
MCH: 27.7 pg (ref 26.0–34.0)
MCHC: 31.9 g/dL (ref 30.0–36.0)
MCV: 86.9 fL (ref 78.0–100.0)
PLATELETS: 285 10*3/uL (ref 150–400)
RBC: 3.75 MIL/uL — AB (ref 4.22–5.81)
RDW: 22.2 % — ABNORMAL HIGH (ref 11.5–15.5)
WBC: 12.2 10*3/uL — AB (ref 4.0–10.5)

## 2017-09-18 MED ORDER — TRAMADOL HCL 50 MG PO TABS: 50.0000 mg | ORAL_TABLET | Freq: Three times a day (TID) | ORAL | 0 refills | Status: DC | PRN

## 2017-09-18 MED ORDER — POTASSIUM CHLORIDE CRYS ER 20 MEQ PO TBCR: 20.0000 meq | EXTENDED_RELEASE_TABLET | Freq: Every day | ORAL | 0 refills | Status: DC

## 2017-09-18 MED ORDER — DIPHENOXYLATE-ATROPINE 2.5-0.025 MG PO TABS: 1.0000 | ORAL_TABLET | Freq: Four times a day (QID) | ORAL | 0 refills | Status: DC | PRN

## 2017-09-18 MED ORDER — PREDNISONE 20 MG PO TABS: 40.0000 mg | ORAL_TABLET | Freq: Every day | ORAL | 0 refills | Status: DC

## 2017-09-18 MED ORDER — MAGNESIUM SULFATE IN D5W 1-5 GM/100ML-% IV SOLN
1.0000 g | Freq: Once | INTRAVENOUS | Status: AC
  Administered 2017-09-18: 1 g via INTRAVENOUS
  Filled 2017-09-18: qty 100

## 2017-09-18 NOTE — Progress Notes (Signed)
Patient discharged home with sister, discharge instructions/prescription given and explained to patient, patient verbalized understanding, denies any pain/distress. Accompanied home by family, transported to the car by staff No pressure ulcer noted. Skin tear on the right arm clean/dry/intact, no sign of infection.

## 2017-09-18 NOTE — Progress Notes (Signed)
Patient discharged but waiting on sister to transport him home, who will be here at 4pm per patient.

## 2017-09-18 NOTE — Discharge Instructions (Signed)
Follow with Primary MD Billie Ruddy, MD in 7 days   Get CBC, BMP, Magnesium checked  by Primary MD  in 5-7 days    Activity: As tolerated with Full fall precautions use walker/cane & assistance as needed  Disposition Home   Diet:   Heart Healthy    For Heart failure patients - Check your Weight same time everyday, if you gain over 2 pounds, or you develop in leg swelling, experience more shortness of breath or chest pain, call your Primary MD immediately. Follow Cardiac Low Salt Diet and 1.5 lit/day fluid restriction.  On your next visit with your primary care physician please Get Medicines reviewed and adjusted.  Please request your Prim.MD to go over all Hospital Tests and Procedure/Radiological results at the follow up, please get all Hospital records sent to your Prim MD by signing hospital release before you go home.  If you experience worsening of your admission symptoms, develop shortness of breath, life threatening emergency, suicidal or homicidal thoughts you must seek medical attention immediately by calling 911 or calling your MD immediately  if symptoms less severe.  You Must read complete instructions/literature along with all the possible adverse reactions/side effects for all the Medicines you take and that have been prescribed to you. Take any new Medicines after you have completely understood and accpet all the possible adverse reactions/side effects.   Do not drive, operate heavy machinery, perform activities at heights, swimming or participation in water activities or provide baby sitting services if your were admitted for syncope or siezures until you have seen by Primary MD or a Neurologist and advised to do so again.  Do not drive when taking Pain medications.    Do not take more than prescribed Pain, Sleep and Anxiety Medications  Special Instructions: If you have smoked or chewed Tobacco  in the last 2 yrs please stop smoking, stop any regular Alcohol  and or  any Recreational drug use.  Wear Seat belts while driving.   Please note  You were cared for by a hospitalist during your hospital stay. If you have any questions about your discharge medications or the care you received while you were in the hospital after you are discharged, you can call the unit and asked to speak with the hospitalist on call if the hospitalist that took care of you is not available. Once you are discharged, your primary care physician will handle any further medical issues. Please note that NO REFILLS for any discharge medications will be authorized once you are discharged, as it is imperative that you return to your primary care physician (or establish a relationship with a primary care physician if you do not have one) for your aftercare needs so that they can reassess your need for medications and monitor your lab values.

## 2017-09-18 NOTE — Discharge Summary (Signed)
[place] IRW:[id number] DOB: 05-07-1961 DOA: 09/14/2017  PCP: Billie Ruddy, MD  Admit date: 09/14/2017  Discharge date: 09/18/2017  Admitted From: Home   Disposition:  Home   Recommendations for Outpatient Follow-up:   Follow up with PCP in 1-2 weeks  PCP Please obtain BMP/CBC, 2 view CXR in 1week,  (see Discharge instructions)   PCP Please follow up on the following pending results:  None   Home Health: None  Equipment/Devices: None  Consultations: GI Discharge Condition: Stable  CODE STATUS: Full   Diet Recommendation:  Heart Healthy    Chief Complaint  Gregory [name] presents with  . Weakness     Brief history of present illness from the day of admission and additional interim summary    Gregory [name] is a 57 y.o. male  with long-standing history of Crohn's disease status post numerous small bowel surgeries-has ileostomy in place-on Entyvio (vedolizumab) as outpatient presented to the hospital with abdominal pain, worsening output from his ostomy.  Thought to have a Crohn's disease flare and admitted to the hospitalist service.See below for further details                                                                   Hospital Course    Suspected Crohn's disease flare: Overall improved with initiation of prednisone- C. difficile PCR negative-colonoscopy via his ostomy yesterday with changes suggestive of ileitis, biopsies have been obtained, his diarrhea has improved considerably after being on prednisone and Imodium, per GI instructions 40 mg of prednisone will be continued until his next GI visit along with as needed Imodium, Gregory [name] is symptom-free will be discharged home, he will check with his GI physician Dr. Havery Moros before taking his next dose of Entyvio.  Nausea/vomiting: Resolved with  supportive care.  Hypokalemia:  Replaced and stable, placed on 20 mg of potassium daily, request PCP to monitor BMP closely over the next 2 weeks.  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  Acute kidney injury: Resolved-after hydration with IV fluids.       Discharge diagnosis     Principal Problem:   Crohn's disease of small and large intestines with complication ([place]) Active Problems:   Hypokalemia   AKI (acute kidney injury) ([place])   Hypomagnesemia    Discharge instructions    Discharge Instructions    Diet - low sodium heart healthy   Complete by:  As directed    Discharge instructions   Complete by:  As directed    Follow with Primary MD Billie Ruddy, MD in 7 days   Get CBC, BMP, Magnesium checked  by Primary MD  in 5-7 days    Activity: As tolerated with Full fall precautions use walker/cane & assistance as needed  Disposition Home   Diet:  Heart Healthy    For Heart failure patients - Check your Weight same time everyday, if you gain over 2 pounds, or you develop in leg swelling, experience more shortness of breath or chest pain, call your Primary MD immediately. Follow Cardiac Low Salt Diet and 1.5 lit/day fluid restriction.  On your next visit with your primary care physician please Get Medicines reviewed and adjusted.  Please request your Prim.MD to go over all Hospital Tests and Procedure/Radiological results at the follow up, please get all Hospital records sent to your Prim MD by signing hospital release before you go home.  If you experience worsening of your admission symptoms, develop shortness of breath, life threatening emergency, suicidal or homicidal thoughts you must seek medical attention immediately by calling 911 or calling your MD immediately  if symptoms less severe.  You Must read complete instructions/literature along with all the possible adverse reactions/side effects for all the Medicines you take and that have been prescribed to you.  Take any new Medicines after you have completely understood and accpet all the possible adverse reactions/side effects.   Do not drive, operate heavy machinery, perform activities at heights, swimming or participation in water activities or provide baby sitting services if your were admitted for syncope or siezures until you have seen by Primary MD or a Neurologist and advised to do so again.  Do not drive when taking Pain medications.    Do not take more than prescribed Pain, Sleep and Anxiety Medications  Special Instructions: If you have smoked or chewed Tobacco  in the last 2 yrs please stop smoking, stop any regular Alcohol  and or any Recreational drug use.  Wear Seat belts while driving.   Please note  You were cared for by a hospitalist during your hospital stay. If you have any questions about your discharge medications or the care you received while you were in the hospital after you are discharged, you can call the unit and asked to speak with the hospitalist on call if the hospitalist that took care of you is not available. Once you are discharged, your primary care physician will handle any further medical issues. Please note that NO REFILLS for any discharge medications will be authorized once you are discharged, as it is imperative that you return to your primary care physician (or establish a relationship with a primary care physician if you do not have one) for your aftercare needs so that they can reassess your need for medications and monitor your lab values.   Increase activity slowly   Complete by:  As directed       Discharge Medications   Allergies as of 09/18/2017      Reactions   Methotrexate Derivatives Other (See Comments)   Anemia, low WBC, severe GI symptoms,    Humira [adalimumab] Other (See Comments)   Intolerance   Penicillins Other (See Comments)   REACTION DURING SURGERY Has Gregory [name] had a PCN reaction causing immediate rash, facial/tongue/throat  swelling, SOB or lightheadedness with hypotension: Unknown Has Gregory [name] had a PCN reaction causing severe rash involving mucus membranes or skin necrosis: Unknown Has Gregory [name] had a PCN reaction that required hospitalization: Unknown Has Gregory [name] had a PCN reaction occurring within the last 10 years: No If all of the above answers are "NO", then may proceed with Cephalosporin use.   Remicade [infliximab] Other (See Comments)   Intolerance      Medication List    TAKE these medications   cholecalciferol 400 units  Tabs tablet Commonly known as:  VITAMIN D Take 400 Units by mouth daily.   diphenoxylate-atropine 2.5-0.025 MG tablet Commonly known as:  LOMOTIL Take 1 tablet by mouth 4 (four) times daily as needed for diarrhea or loose stools. What changed:    when to take this  reasons to take this   ENTYVIO IV Inject into the vein.   ferrous sulfate 325 (65 FE) MG tablet Take 1 tablet (325 mg total) 2 (two) times daily with a meal by mouth.   folic acid 1 MG tablet Commonly known as:  FOLVITE Take 1 Tablet by mouth once daily   ipratropium 0.03 % nasal spray Commonly known as:  ATROVENT Place 2 sprays 2 (two) times daily into both nostrils. Can use 3 times a day as needed   LEXAPRO 10 MG tablet Generic drug:  escitalopram Take 10 mg by mouth daily.   midodrine 10 MG tablet Commonly known as:  PROAMATINE Take 1 tablet (10 mg total) by mouth 2 (two) times daily with a meal.   multivitamin with minerals Tabs tablet Take 1 tablet by mouth daily.   ondansetron 4 MG disintegrating tablet Commonly known as:  ZOFRAN-ODT Take 1 tablet (4 mg total) by mouth every 8 (eight) hours as needed for nausea or vomiting.   potassium chloride SA 20 MEQ tablet Commonly known as:  K-DUR,KLOR-CON Take 1 tablet (20 mEq total) by mouth daily. Get your BMP checked within 1 week by PCP and get potassium dose readjusted What changed:    when to take this  additional instructions     predniSONE 20 MG tablet Commonly known as:  DELTASONE Take 2 tablets (40 mg total) by mouth daily with breakfast. Kindly inform Dr. Doyne Keel office when you see them next visit that you are taking prednisone 40 mg daily, they will adjust the dose at next visit. Start taking on:  09/19/2017   ranitidine 150 MG tablet Commonly known as:  ZANTAC Take 1 tablet (150 mg total) by mouth 2 (two) times daily.   sertraline 50 MG tablet Commonly known as:  ZOLOFT Take 1 tablet (50 mg total) by mouth daily.   traMADol 50 MG tablet Commonly known as:  ULTRAM Take 1 tablet (50 mg total) by mouth every 8 (eight) hours as needed.       Follow-up Information    Zehr, Laban Emperor, PA-C Follow up on 10/01/2017.   Specialty:  Gastroenterology Why:  1:30 pm  Contact information: [place] [address] [phone]        Billie Ruddy, MD. Schedule an appointment as soon as possible for a visit in 1 week(s).   Specialty:  Family Medicine Contact information: [place] [address] [phone]           Major procedures and Radiology Reports - PLEASE review detailed and final reports thoroughly  -      Colonoscopy- via the ostomy shows ileitis suggestive of Crohn's disease, biopsies obtained.    Ct Abdomen Pelvis Wo Contrast  Result Date: 09/14/2017 CLINICAL DATA:  Gregory [name] presents c/o weakness and having no energy. Gregory [name] concerned his hemoglobin could be low. C/o abdominal pain and nausea. Pt presents lethargic. Hx of crohn's and has colostomy bag. Denies chest pain. EXAM: CT ABDOMEN AND PELVIS WITHOUT CONTRAST TECHNIQUE: Multidetector CT imaging of the abdomen and pelvis was performed following the standard protocol without IV contrast. COMPARISON:  05/19/2017 FINDINGS: Lower chest: Heart is normal size. There are emphysematous  blebs in the anterior lung bases. Mild dependent subsegmental atelectasis is noted in the left lower lobe. No acute  findings. Hepatobiliary: Liver is unremarkable. Gallbladder not visualize, presumed surgically absent. Common bile duct measures a maximum of 9 mm in diameter with distal tapering, unchanged from the prior CT. Pancreas: Unremarkable. No pancreatic ductal dilatation or surrounding inflammatory changes. Spleen: Normal in size without focal abnormality. Adrenals/Urinary Tract: Adrenal glands are unremarkable. Kidneys are normal, without renal calculi, focal lesion, or hydronephrosis. Bladder is unremarkable. Stomach/Bowel: In the left mid to upper abdomen, there is a dilated bowel loop which appears to be the colon, dilated to 6.4 cm. There is an associated anastomosis staple line. This connects to a prominent loop of thick walled bowel, that is likely ileum, which extends into the pelvis and from there into the left mid to upper abdomen. Wall measures up to 9 mm in thickness. There is mild associated hazy opacity in the mesentery and several prominent to mildly enlarged mesenteric lymph nodes, largest measuring 1 cm short axis. No other bowel inflammation. An anastomosis staple line is noted at the rectosigmoid junction. Vascular/Lymphatic: No other prominent or enlarged lymph nodes. No significant vascular abnormality. Reproductive: Unremarkable. Other: Stable appearance of the left lower quadrant colostomy. No ascites. Musculoskeletal: No fracture or acute finding. No osteoblastic or osteolytic lesions. Disk degenerative changes at L5-S1. IMPRESSION: 1. Thick walled loop of bowel with mild associated mesenteric inflammation. This appears to be ileum, which extends from the left mid abdomen anastomosis into the pelvis and right lower quadrant. This was present to a similar degree on the prior CT. Active Crohn's disease is suspected. 2. No other acute abnormalities. No evidence of an abscess or fistula. Electronically Signed   By: Lajean Manes M.D.   On: 09/14/2017 19:29   Dg Chest 2 View  Result Date:  09/14/2017 CLINICAL DATA:  Shortness of breath EXAM: CHEST  2 VIEW COMPARISON:  None FINDINGS: The lungs are hyperinflated likely secondary to COPD. There is no focal consolidation. There is no pleural effusion or pneumothorax. The heart and mediastinal contours are unremarkable. The osseous structures are unremarkable. IMPRESSION: No active cardiopulmonary disease. Emphysema (ICD10-J43.9). Electronically Signed   By: Kathreen Devoid   On: 09/14/2017 14:35    Micro Results     Recent Results (from the past 240 hour(s))  C difficile quick scan w PCR reflex     Status: None   Collection Time: 09/15/17  4:13 AM  Result Value Ref Range Status   C Diff antigen NEGATIVE NEGATIVE Final   C Diff toxin NEGATIVE NEGATIVE Final   C Diff interpretation No C. difficile detected.  Final    Today   Subjective    Gregory [name] today has no headache,no chest abdominal pain,no new weakness tingling or numbness, feels much better wants to go home today.     Objective   Blood pressure 109/63, pulse 60, temperature 98.4 F (36.9 C), temperature source Oral, resp. rate 16, height 5' 11"  (1.803 m), weight 56.7 kg (125 lb 0 oz), SpO2 100 %.   Intake/Output Summary (Last 24 hours) at 09/18/2017 0919 Last data filed at 09/18/2017 0600 Gross per 24 hour  Intake 2237.5 ml  Output -  Net 2237.5 ml    Exam Awake Alert, Oriented x 3, No new F.N deficits, Normal affect [place].AT,PERRAL Supple Neck,No JVD, No cervical lymphadenopathy appriciated.  Symmetrical Chest wall movement, Good air movement bilaterally, CTAB RRR,No Gallops,Rubs or new Murmurs, No Parasternal Heave +ve B.Sounds,  Abd Soft, Non tender, No organomegaly appriciated, No rebound -guarding or rigidity.  Ileostomy bag in place with liquid stool in it. No Cyanosis, Clubbing or edema, No new Rash or bruise   Data Review   CBC w Diff:  Lab Results  Component Value Date   WBC 12.2 (H) 09/18/2017   HGB 10.4 (L) 09/18/2017   HCT 32.6 (L)  09/18/2017   PLT 285 09/18/2017   LYMPHOPCT 21 09/14/2017   MONOPCT 12 09/14/2017   EOSPCT 1 09/14/2017   BASOPCT 0 09/14/2017    CMP:  Lab Results  Component Value Date   NA 137 09/18/2017   K 4.2 09/18/2017   CL 115 (H) 09/18/2017   CO2 19 (L) 09/18/2017   BUN 8 09/18/2017   CREATININE 0.99 09/18/2017   PROT 7.1 09/14/2017   ALBUMIN 3.1 (L) 09/14/2017   BILITOT 0.9 09/14/2017   ALKPHOS 95 09/14/2017   AST 20 09/14/2017   ALT 19 09/14/2017  .   Total Time in preparing paper work, data evaluation and todays exam - 46 minutes  Lala Lund M.D on 09/18/2017 at 9:19 AM  [facility]   Office  [phone]

## 2017-09-20 ENCOUNTER — Telehealth: Payer: Self-pay | Admitting: *Deleted

## 2017-09-20 ENCOUNTER — Encounter (HOSPITAL_COMMUNITY): Payer: Self-pay | Admitting: Gastroenterology

## 2017-09-20 NOTE — Telephone Encounter (Signed)
Unable to reach patient at time of TCM Call. Left message for patient to return call when available.  

## 2017-09-21 ENCOUNTER — Other Ambulatory Visit: Payer: Self-pay

## 2017-09-21 DIAGNOSIS — K50819 Crohn's disease of both small and large intestine with unspecified complications: Secondary | ICD-10-CM

## 2017-09-21 NOTE — Telephone Encounter (Signed)
Unable to reach patient at time of TCM Call. Left message for patient to return call when available.  

## 2017-09-22 NOTE — Telephone Encounter (Signed)
Patient returned call to office, then declined TCM call stating "I need to talk to the doctor that did my colonoscopy. I've been waiting for 2 hours for a call back from them about my results." Attempted to explain that I was calling from his PCP's office to follow-up with him since he was just released from the hospital and that I do not have his test results. He proceeded to tell me I was "confused" and asked how he got on the line with PCP office. Attempted to explain that he called our office, likely returning the 2 previous messages I left. Patient interrupted and said "you're wasting my time. I have to go call my other doctor" and disconnected the call.

## 2017-10-01 ENCOUNTER — Ambulatory Visit (INDEPENDENT_AMBULATORY_CARE_PROVIDER_SITE_OTHER): Payer: Medicare Other | Admitting: Gastroenterology

## 2017-10-01 ENCOUNTER — Encounter: Payer: Self-pay | Admitting: Gastroenterology

## 2017-10-01 VITALS — BP 98/70 | HR 84 | Ht 69.25 in | Wt 117.1 lb

## 2017-10-01 DIAGNOSIS — G8928 Other chronic postprocedural pain: Secondary | ICD-10-CM

## 2017-10-01 DIAGNOSIS — K5080 Crohn's disease of both small and large intestine without complications: Secondary | ICD-10-CM

## 2017-10-01 MED ORDER — ONDANSETRON 4 MG PO TBDP: 4.0000 mg | ORAL_TABLET | Freq: Three times a day (TID) | ORAL | 0 refills | Status: DC | PRN

## 2017-10-01 MED ORDER — TRAMADOL HCL 50 MG PO TABS: 50.0000 mg | ORAL_TABLET | Freq: Three times a day (TID) | ORAL | 0 refills | Status: DC | PRN

## 2017-10-01 MED ORDER — PREDNISONE 10 MG PO TABS: 10.0000 mg | ORAL_TABLET | Freq: Every day | ORAL | 0 refills | Status: DC

## 2017-10-01 NOTE — Progress Notes (Signed)
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 Havery Moros.  Was recently hospitalized for flare of Crohn's with ileitis as seen on colonoscopy/ileoscopy.  This was performed on 09/17/2017 by Dr. Silverio Decamp with results as follows:  "Patchy inflammation, graded as Rutgeerts Score i4 (diffuse inflammation with large deep lesions and/or narrowing) and characterized by congestion (edema), erosions, erythema, friability, granularity, mucus, aphthous ulcerations and serpentine ulcerations was found in the entire examined ileum. Biopsies were taken with a cold forceps for histology. No visible stricture in the visualized portion of ileum ~20 from ileocolonic anastomosis or colon."  Biopsies showed mild active inflammation and focal erosions suggestive of active Crohn's disease.  Is on prednisone 40 mg daily.  Feeling somewhat better, but still has a lot of pain with certain foods, etc.  Is due for Entyvio on 2/13 so will check trough level on 2/12 or 2/13 prior to his infusion.  Feels like ostomy output is fairly normal for the most part.   Past Medical History:  Diagnosis Date  . Acute endocarditis   . Crohn's disease ([place])   . Depression   . Eating disorder   . GERD (gastroesophageal reflux disease)   . Kidney stones   . Short gut syndrome    Past Surgical History:  Procedure Laterality Date  . COLONOSCOPY WITH PROPOFOL N/A 09/17/2017   Procedure: COLONOSCOPY WITH PROPOFOL;  Surgeon: [name], MD;  Location: [facility];  Service: Endoscopy;  Laterality: N/A;  Through ileostomy.  Gregory [name] WITH STENT PLACEMENT Left 05/20/2017   Procedure: CYSTOSCOPY WITH STENT PLACEMENT;  Surgeon: [name], MD;  Location: [facility];  Service: Urology;  Laterality: Left;  . ILEOSTOMY    .  IR NEPHROSTOMY EXCHANGE LEFT  04/12/2017  . IR NEPHROSTOMY PLACEMENT LEFT  04/12/2017  . IR NEPHROSTOMY PLACEMENT LEFT  04/29/2017  . NEPHROLITHOTOMY Left 05/14/2017   Procedure: LEFT NEPHROLITHOTOMY PERCUTANEOUS WITH SURGEON ACCESS;  Surgeon: [name], MD;  Location: [facility];  Service: Urology;  Laterality: Left;  . SMALL INTESTINE SURGERY    . SUBTOTAL COLECTOMY      reports that he quit smoking about 11 years ago. His smoking use included cigarettes. He has a 20.00 pack-year smoking history. he has never used smokeless tobacco. He reports that he does not drink alcohol or use drugs. family history includes Crohn's disease in his brother; Diabetes in his father and paternal grandmother; Heart attack in his father; Heart disease in his father and mother; Inflammatory bowel disease in his brother; Melanoma in his sister. Allergies  Allergen Reactions  . Methotrexate Derivatives Other (See Comments)    Anemia, low WBC, severe GI symptoms,   . Humira [Adalimumab] Other (See Comments)    Intolerance  . Penicillins Other (See Comments)    REACTION DURING SURGERY Has patient had a PCN reaction causing immediate rash, facial/tongue/throat swelling, SOB or lightheadedness with hypotension: Unknown Has patient had a PCN reaction causing severe rash involving mucus membranes or skin necrosis: Unknown Has patient had a PCN reaction that required hospitalization: Unknown Has patient had a PCN reaction occurring within the last 10 years: No If all of the above answers are "NO", then may proceed with Cephalosporin use.   . Remicade [Infliximab] Other (See Comments)    Intolerance  Outpatient Encounter Medications as of 10/01/2017  Medication Sig  . cholecalciferol (VITAMIN D) 400 units TABS tablet Take 400 Units by mouth daily.  . diphenoxylate-atropine (LOMOTIL) 2.5-0.025 MG tablet Take 1 tablet by mouth 4 (four) times daily as needed for diarrhea or loose stools.  Marland Kitchen escitalopram  (LEXAPRO) 10 MG tablet Take 10 mg by mouth daily.  . ferrous sulfate 325 (65 FE) MG tablet Take 1 tablet (325 mg total) 2 (two) times daily with a meal by mouth.  Marland Kitchen ipratropium (ATROVENT) 0.03 % nasal spray Place 2 sprays 2 (two) times daily into both nostrils. Can use 3 times a day as needed  . Multiple Vitamin (MULTIVITAMIN WITH MINERALS) TABS tablet Take 1 tablet by mouth daily.  . ondansetron (ZOFRAN-ODT) 4 MG disintegrating tablet Take 1 tablet (4 mg total) by mouth every 8 (eight) hours as needed for nausea or vomiting.  . potassium chloride SA (K-DUR,KLOR-CON) 20 MEQ tablet Take 1 tablet (20 mEq total) by mouth daily. Get your BMP checked within 1 week by PCP and get potassium dose readjusted  . predniSONE (DELTASONE) 20 MG tablet Take 2 tablets (40 mg total) by mouth daily with breakfast. Kindly inform Dr. Doyne Keel office when you see them next visit that you are taking prednisone 40 mg daily, they will adjust the dose at next visit.  . ranitidine (ZANTAC) 150 MG tablet Take 1 tablet (150 mg total) by mouth 2 (two) times daily.  . sertraline (ZOLOFT) 50 MG tablet Take 1 tablet (50 mg total) by mouth daily.  . traMADol (ULTRAM) 50 MG tablet Take 1 tablet (50 mg total) by mouth every 8 (eight) hours as needed.  . Vedolizumab (ENTYVIO IV) Inject into the vein.  . [DISCONTINUED] folic acid (FOLVITE) 1 MG tablet Take 1 Tablet by mouth once daily  . [DISCONTINUED] midodrine (PROAMATINE) 10 MG tablet Take 1 tablet (10 mg total) by mouth 2 (two) times daily with a meal. (Patient not taking: Reported on 09/14/2017)   No facility-administered encounter medications on file as of 10/01/2017.      REVIEW OF SYSTEMS  : All other systems reviewed and negative except where noted in the History of Present Illness.   PHYSICAL EXAM: BP 98/70   Pulse 84   Ht 5' 9.25" (1.759 m)   Wt 117 lb 2 oz (53.1 kg)   BMI 17.17 kg/m  General: Well developed white male in no acute distress Head: Normocephalic  and atraumatic Eyes:  Sclerae anicteric, conjunctiva pink. Ears: Normal auditory acuity Lungs: Clear throughout to auscultation; no increased WOB. Heart: Regular rate and rhythm; no M/R/G. Abdomen: Soft, non-distended.  BS present, hyperactive. Musculoskeletal: Symmetrical with no gross deformities  Skin: No lesions on visible extremities Extremities: No edema  Neurological: Alert oriented x 4, grossly non-focal Psychological:  Alert and cooperative. 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  Flare of Crohn's with ileitis as seen on colonoscopy/ileoscopy.  Is on prednisone 40 mg daily.  Feeling somewhat better, but still has a lot of pain with certain foods, etc.  Is due for Entyvio on 2/13 so will check trough level on 2/12 or 2/13 prior to his infusion.  Will decrease prednisone to 35 mg for 2 weeks then down to 30 mg.  Then has follow-up with Dr. Havery Moros on 2/20.  Will refill zofran and tramadol.  CC:  Billie Ruddy, MD

## 2017-10-01 NOTE — Patient Instructions (Addendum)
Come to get labwork done on 10-12-17 or 10-13-17, come to the office first to pick up the kit, and then go to the basement to have it drawn.   Prednisone 35 mg for two weeks then decrease by 5 mg each week until your follow up appointment with Dr. Havery Moros. (we have sent in 10 mg tablets)  We have sent the following medications to your pharmacy for you to pick up at your convenience: Zofran  Tramadol

## 2017-10-03 NOTE — Progress Notes (Signed)
Agree with assessment and plan as outlined. Complicated Crohn's history with active disease despite Entyvio infusions. Will check Entyvio trough level and rule out antibodies, which will influence if we continue this regimen or not, or alter dose. Agree with taper of prednisone, hopefully he tolerates reduction by 52m / week. If we end up stopping Entyvio based on his levels, would consider Stelara if no contraindications.

## 2017-10-13 ENCOUNTER — Other Ambulatory Visit: Payer: Medicare Other

## 2017-10-13 DIAGNOSIS — K5090 Crohn's disease, unspecified, without complications: Secondary | ICD-10-CM | POA: Diagnosis not present

## 2017-10-13 DIAGNOSIS — K50819 Crohn's disease of both small and large intestine with unspecified complications: Secondary | ICD-10-CM | POA: Diagnosis not present

## 2017-10-19 ENCOUNTER — Telehealth: Payer: Self-pay | Admitting: Gastroenterology

## 2017-10-19 NOTE — Telephone Encounter (Signed)
Called Gregory [name] back, asked her to check with their physician about infusing 600 mg every 8 weeks. She will check and get back with [place] tomorrow.

## 2017-10-19 NOTE — Telephone Encounter (Signed)
Gregory [name] calling from [facility] states she is returning a phone call. Best cal back # [phone].

## 2017-10-19 NOTE — Telephone Encounter (Signed)
Spoke to [place] at [facility], they cannot do Entyvio every 4 weeks since it is against manufacturers recommendations. Not sure if Medicare will even pay for it I can find someone to give it every 4 weeks. I asked them to leave patient on their schedule for every 8 weeks and if orders change I would contact them.  Please advise.

## 2017-10-19 NOTE — Telephone Encounter (Signed)
Almyra Free is there any way to appeal this? His level is low which is why it is not working, he was recently hospitalized for a flare. The reason levels are drawn is to see if dosing changes would benefit the patient, which in this case I think it would. He either needs this infused 332m every 4 weeks, or increase the dose from 3061mper infusion every 8 weeks to 60028mvery 8 weeks. My preference would be 300m86mery 4 weeks if possible. If they cannot do that, please ask if they can increase to 600mg16mry 8 weeks.   Can you please let me know what they say? Thanks

## 2017-10-19 NOTE — Telephone Encounter (Signed)
Called [facility] and left message for [place] that changing frequency of the Entyvio IV infusion from every 8 weeks to every 4 weeks. Faxed order to: [phone].

## 2017-10-19 NOTE — Telephone Encounter (Signed)
Results received regarding Entyvio levels as follows:  10/13/2017 Entyvio level of 5.4 NO antibody detected   Overall, given his recent flare, this could be due to subtherapeutic Entyvio level. Goal is level > 12. Recommend we increase his infusion frequency from every 8 weeks to every 4 weeks.  Almyra Free can you help to start processing this? No need to call him about it however, I am seeing him in clinic tomorrow and will discuss this with him at that time. thanks

## 2017-10-20 ENCOUNTER — Other Ambulatory Visit (INDEPENDENT_AMBULATORY_CARE_PROVIDER_SITE_OTHER): Payer: Medicare Other

## 2017-10-20 ENCOUNTER — Encounter: Payer: Self-pay | Admitting: Gastroenterology

## 2017-10-20 ENCOUNTER — Ambulatory Visit (INDEPENDENT_AMBULATORY_CARE_PROVIDER_SITE_OTHER): Payer: Medicare Other | Admitting: Gastroenterology

## 2017-10-20 VITALS — BP 86/66 | HR 72 | Ht 69.25 in | Wt 122.0 lb

## 2017-10-20 DIAGNOSIS — Z933 Colostomy status: Secondary | ICD-10-CM | POA: Diagnosis not present

## 2017-10-20 DIAGNOSIS — K50018 Crohn's disease of small intestine with other complication: Secondary | ICD-10-CM | POA: Diagnosis not present

## 2017-10-20 DIAGNOSIS — R8290 Unspecified abnormal findings in urine: Secondary | ICD-10-CM

## 2017-10-20 LAB — URINALYSIS
Bilirubin Urine: NEGATIVE
HGB URINE DIPSTICK: NEGATIVE
Ketones, ur: NEGATIVE
LEUKOCYTES UA: NEGATIVE
Nitrite: NEGATIVE
SPECIFIC GRAVITY, URINE: 1.02 (ref 1.000–1.030)
Total Protein, Urine: NEGATIVE
UROBILINOGEN UA: 0.2 (ref 0.0–1.0)
Urine Glucose: NEGATIVE
pH: 6 (ref 5.0–8.0)

## 2017-10-20 MED ORDER — PREDNISONE 10 MG PO TABS: ORAL_TABLET | ORAL | 1 refills | Status: DC

## 2017-10-20 NOTE — Telephone Encounter (Signed)
Spoke to [place] at [facility], they did get approval to begin Entyvio 300 mg every 4 weeks. They will contact patient to schedule.

## 2017-10-20 NOTE — Patient Instructions (Addendum)
If you are age 57 or older, your body mass index should be between 23-30. Your Body mass index is 17.89 kg/m. If this is out of the aforementioned range listed, please consider follow up with your Primary Care Provider.  If you are age 67 or younger, your body mass index should be between 19-25. Your Body mass index is 17.89 kg/m. If this is out of the aformentioned range listed, please consider follow up with your Primary Care Provider.   Please go to the lab in the basement of our building to have lab work done as you leave today.  We have sent the following medications to your pharmacy for you to pick up at your convenience: Prednisone (wean by 30m each week).   Thank you for entrusting me with your care and [facility] SC[facility]

## 2017-10-20 NOTE — Telephone Encounter (Signed)
This is greatly appreciated, thanks. The patient will be very happy to hear this. He should have his next infusion then scheduled in March (last was on 2/13). Can you let him know, or does he need to call [facility] to schedule? Thanks

## 2017-10-20 NOTE — Progress Notes (Signed)
HPI :  Chron's history Diagnosed with Crohn's disease in 7s. 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 He has a ileostomy with a remnant rectal pouch. He reports he has had a bowel surgery in the 1982, with a "few more surgeries" in years after while in [place], as well as in [facility], they tried reversing his ileostomy which did not workwell for him, too much diarrhea and it was then reversed back to ileostomy. He has a history of abscess associated with small bowel.   He is chronic loose stoolsfrom theostomy. He has historically been on chronic codeine 69m every 6-12hours for diarrhea - which [place] He has been hospitalized multiple times in recent years,   He has been on chronic prednisone for 10 years with fluctuating levels. He was Cimzia for about 4-5 years in the past.  He has been on sulfasalazine for a long time. He reports an allergy to 6MP - he does not know the details ofthe reaction. He had an allergic reaction - hives, to both Remicade and Humira. Allergy to methotrexate, now on Entyvio.  SINCE LAST VISIT  The patient has not been on methotrexate since her last visit, given severe reaction that led to hospitalization from this.  He has been managed with Entyvio monotherapy and generally thought it has been working however unfortunately he was admitted to the hospital in January for a flare of his Crohn's disease.  He had a CT scan at that time showing some thickened ileum concerning for active Crohn's disease.  He had a colonoscopy per Dr. NSilverio Decampwhile hospitalized showing a normal colon but with active inflammation at the ileocolonic anastomosis. Rutgeerts Score i4. He was given a course of prednisone with subsequent taper. He had Entyvio levels obtained - he had a level of 5.4 without any antibodies drawn on 10/13/2017. He has previously had a severe reaction to methotrexate which  led to hospitalization and this has been stopped. He's had a prior allergic reaction to 6MP reported, and developed Hives to Humira and Remicade. Cimzia he does not think provided any benefit.   In general he states he is feeling much better since hospitalization.  He is currently taking 35 mg/day of prednisone and is tapering by 5 mg/week.  He states his appetite is much better and has been gaining weight and eating well.  He denies any nausea or vomiting.  He was noted by his primary care to have low potassium level last week and is now on potassium supplementation.  His ostomy output is stable, he takes 4 Lomotil as per day.  He denies any blood in his output.  Very generally he states he is doing well, his only complaint is that his urine has a foul order and he states he is concerned he may have a urinary tract infection.  He denies any dysuria, no blood in the urine, no fevers.  He states he is up-to-date with flu shot and pneumococcal vaccine [place]  He is taking 2000 units of vitamin D per day.  EGD 03/21/2009 - normal Colonoscopy 08/19/2010 - colo-colonic anastomosis at 60cm, active colitis and nonobstructive stricture at 70cm, ileocolonic stricture at 75cm Colonoscopy 08/2017 - normal colon, active ileitis   Past Medical History:  Diagnosis Date  . Acute endocarditis   . [place]   . Depression   . Eating disorder   . GERD (gastroesophageal reflux disease)   .  Kidney stones   . Short gut syndrome      Past Surgical History:  Procedure Laterality Date  . COLONOSCOPY WITH PROPOFOL N/A 09/17/2017   Procedure: COLONOSCOPY WITH PROPOFOL;  Surgeon: [name], MD;  [facility];  Service: Endoscopy;  Laterality: N/A;  Through ileostomy.  [name] WITH STENT PLACEMENT Left 05/20/2017   Procedure: CYSTOSCOPY WITH STENT PLACEMENT;  Surgeon: [name], MD;  Location: [facility];  Service: Urology;  Laterality: Left;  . ILEOSTOMY    .  IR NEPHROSTOMY EXCHANGE LEFT  04/12/2017  . IR NEPHROSTOMY PLACEMENT LEFT  04/12/2017  . IR NEPHROSTOMY PLACEMENT LEFT  04/29/2017  . NEPHROLITHOTOMY Left 05/14/2017   Procedure: LEFT NEPHROLITHOTOMY PERCUTANEOUS WITH SURGEON ACCESS;  Surgeon: [name], MD;  Location: [facility];  Service: Urology;  Laterality: Left;  . SMALL INTESTINE SURGERY    . SUBTOTAL COLECTOMY     Family History  Problem Relation Age of Onset  . Heart disease Mother   . Diabetes Father   . Heart disease Father   . Heart attack Father   . Melanoma Sister   . Crohn's disease Brother   . Inflammatory bowel disease Brother   . Diabetes Paternal Grandmother    Social History   Tobacco Use  . Smoking status: Former Smoker    Packs/day: 1.00    Years: 20.00    Pack years: 20.00    Types: Cigarettes    Last attempt to quit: 08/31/2006    Years since quitting: 11.1  . Smokeless tobacco: Never Used  Substance Use Topics  . Alcohol use: No  . Drug use: No   Current Outpatient Medications  Medication Sig Dispense Refill  . cholecalciferol (VITAMIN D) 400 units TABS tablet Take 400 Units by mouth daily.    . diphenoxylate-atropine (LOMOTIL) 2.5-0.025 MG tablet Take 1 tablet by mouth 4 (four) times daily as needed for diarrhea or loose stools. 120 tablet 0  . escitalopram (LEXAPRO) 10 MG tablet Take 10 mg by mouth daily.    . ferrous sulfate 325 (65 FE) MG tablet Take 1 tablet (325 mg total) 2 (two) times daily with a meal by mouth. 120 tablet 1  . ipratropium (ATROVENT) 0.03 % nasal spray Place 2 sprays 2 (two) times daily into both nostrils. Can use 3 times a day as needed 30 mL 2  . Multiple Vitamin (MULTIVITAMIN WITH MINERALS) TABS tablet Take 1 tablet by mouth daily. 30 tablet 3  . ondansetron (ZOFRAN-ODT) 4 MG disintegrating tablet Take 1 tablet (4 mg total) by mouth every 8 (eight) hours as needed for nausea or vomiting. 30 tablet 0  . potassium chloride SA (K-DUR,KLOR-CON) 20 MEQ tablet Take 1 tablet (20  mEq total) by mouth daily. Get your BMP checked within 1 week by PCP and get potassium dose readjusted 30 tablet 0  . predniSONE (DELTASONE) 10 MG tablet Take 1 tablet (10 mg total) by mouth daily with breakfast. (Patient taking differently: Take 35 mg by mouth daily with breakfast. ) 100 tablet 0  . ranitidine (ZANTAC) 150 MG tablet Take 1 tablet (150 mg total) by mouth 2 (two) times daily. 60 tablet 6  . sertraline (ZOLOFT) 50 MG tablet Take 1 tablet (50 mg total) by mouth daily. 90 tablet 1  . traMADol (ULTRAM) 50 MG tablet Take 1 tablet (50 mg total) by mouth every 8 (eight) hours as needed. 10 tablet 0  . Vedolizumab (ENTYVIO IV) Inject into the vein.  No current facility-administered medications for this visit.    Allergies  Allergen Reactions  . Methotrexate Derivatives Other (See Comments)    Anemia, low WBC, severe GI symptoms,   . Humira [Adalimumab] Other (See Comments)    Intolerance  . Penicillins Other (See Comments)    REACTION DURING SURGERY Has patient had a PCN reaction causing immediate rash, facial/tongue/throat swelling, SOB or lightheadedness with hypotension: Unknown Has patient had a PCN reaction causing severe rash involving mucus membranes or skin necrosis: Unknown Has patient had a PCN reaction that required hospitalization: Unknown Has patient had a PCN reaction occurring within the last 10 years: No If all of the above answers are "NO", then may proceed with Cephalosporin use.   . Remicade [Infliximab] Other (See Comments)    Intolerance     Review of Systems: All systems reviewed and negative except where noted in HPI.   Lab Results  Component Value Date   WBC 12.2 (H) 09/18/2017   HGB 10.4 (L) 09/18/2017   HCT 32.6 (L) 09/18/2017   MCV 86.9 09/18/2017   PLT 285 09/18/2017    Lab Results  Component Value Date   CREATININE 0.99 09/18/2017   BUN 8 09/18/2017   NA 137 09/18/2017   K 4.2 09/18/2017   CL 115 (H) 09/18/2017   CO2 19 (L)  09/18/2017     Physical Exam: BP (!) 86/66   Pulse 72   Ht 5' 9.25" (1.759 m)   Wt 122 lb (55.3 kg)   BMI 17.89 kg/m  - BP at baseline usually around 90s/70s Constitutional: Pleasant, male in no acute distress. HEENT: Normocephalic and atraumatic. Conjunctivae are normal. No scleral icterus. Neck supple.  Cardiovascular: Normal rate, regular rhythm.  Pulmonary/chest: Effort normal and breath sounds normal. No wheezing, rales or rhonchi. Abdominal: Soft, nondistended, mild RLQ TTP. Ostomy intact.There are no masses palpable. Extremities: no edema Lymphadenopathy: No cervical adenopathy noted. Neurological: Alert and oriented to person place and time. Skin: Skin is warm and dry. No rashes noted. Psychiatric: Normal mood and affect. Behavior is normal.   ASSESSMENT AND PLAN: 57 year old male with severe penetrating/stricturing ileal Crohn's disease with history of multiple small bowel resections and associated short gut syndrome.  See history as above -intolerant to multiple anti-TNFs and 6-MP/methotrexate.  On Entyvio monotherapy which has helped him since initially started last year, but recently admitted for flare of disease.  Now on prednisone with improvement in symptoms.  His Entyvio trough level was only 5, without antibodies.  Based on this panel I recommend we increase his Entyvio dosing, to get to a goal level greater than 12.  If he continues to have active disease despite an adequate trough level, then we may have to consider another regimen such as Stelara.  Otherwise he notes some change in urine odor, screen to ensure no evidence of UTI.  Recommend following: - increase Entyvio dosing to 382m every 4 weeks (next due in mid March) - continue to taper prednisone by 519m/ week until done - increase vitamin D to 4000 IU / day - continue iron supplementation - continue lomotil - UA with urine culture today - vaccines UTD  Follow up in clinic in 3 months.   StCarolina CellarMD [facility] Pager [phone]

## 2017-10-21 LAB — URINE CULTURE
MICRO NUMBER:: 90224560
Result:: NO GROWTH
SPECIMEN QUALITY: ADEQUATE

## 2017-10-21 NOTE — Telephone Encounter (Signed)
Let patient know that he was approved to get the Entyvio 300 mg every 4 weeks. They are supposed to contact him to schedule this. Instructed him that if he has not heard from [facility] by this afternoon to call them to get set up.

## 2017-10-27 ENCOUNTER — Telehealth: Payer: Self-pay | Admitting: Family Medicine

## 2017-10-27 NOTE — Telephone Encounter (Signed)
Pt dropped off [employer] Benefits information forms that he would like for Dr. Volanda Napoleon to fill out and upon completion he would like for the form to be faxed to [employer] @ [phone].  Form was put in doctors folder.

## 2017-10-28 ENCOUNTER — Telehealth: Payer: Self-pay | Admitting: Gastroenterology

## 2017-10-28 ENCOUNTER — Other Ambulatory Visit: Payer: Self-pay

## 2017-10-28 DIAGNOSIS — G8928 Other chronic postprocedural pain: Secondary | ICD-10-CM

## 2017-10-28 MED ORDER — TRAMADOL HCL 50 MG PO TABS: 50.0000 mg | ORAL_TABLET | Freq: Three times a day (TID) | ORAL | 0 refills | Status: DC | PRN

## 2017-10-28 NOTE — Telephone Encounter (Signed)
Patient states he had to switch pharmacies and needs a new prescription of medication tramadol sent to the [facility].

## 2017-11-01 NOTE — Telephone Encounter (Addendum)
Form completed by provider and mailed/faxed as requested. Copy filed at front desk for patient pick up and copy placed in scan bin. Called patient and left message to return call to notify him that form is ready for pick up.

## 2017-11-03 ENCOUNTER — Other Ambulatory Visit: Payer: Self-pay | Admitting: Gastroenterology

## 2017-11-04 ENCOUNTER — Telehealth: Payer: Self-pay | Admitting: Gastroenterology

## 2017-11-04 ENCOUNTER — Other Ambulatory Visit: Payer: Self-pay

## 2017-11-04 DIAGNOSIS — R197 Diarrhea, unspecified: Secondary | ICD-10-CM

## 2017-11-04 DIAGNOSIS — R109 Unspecified abdominal pain: Secondary | ICD-10-CM

## 2017-11-04 NOTE — Telephone Encounter (Signed)
Patient states he still has ulcers and wants to know if he can be prescribed an antibiotic to help.

## 2017-11-04 NOTE — Telephone Encounter (Signed)
I think we should obtain CBC and CMET for him to ensure okay.  If he is having diarrhea, we should also check for C Diff.  Can you clarify when he is next due for his Entyvio, and what dose of prednisone he is on (he should be tapering it). Thanks

## 2017-11-04 NOTE — Telephone Encounter (Signed)
Gregory [name] will await labs first and contact him with results

## 2017-11-04 NOTE — Telephone Encounter (Signed)
Patient states he is still having abdominal pain, increased stools. He is relating it to "ulcers in his small intestines" and wondered if an antibiotic would help. He is very fatigued, states his temperature is 99.0 tympanic.

## 2017-11-04 NOTE — Telephone Encounter (Signed)
Spoke to patient, he will try and come to the lab today. He is still having diarrhea so will do C diff test too. He is on prednisone 20 mg/day currently. His next dose of Entyvio is due on 3/14.

## 2017-11-05 ENCOUNTER — Other Ambulatory Visit: Payer: Self-pay | Admitting: Gastroenterology

## 2017-11-05 ENCOUNTER — Other Ambulatory Visit (INDEPENDENT_AMBULATORY_CARE_PROVIDER_SITE_OTHER): Payer: Medicare Other

## 2017-11-05 DIAGNOSIS — R197 Diarrhea, unspecified: Secondary | ICD-10-CM | POA: Diagnosis not present

## 2017-11-05 DIAGNOSIS — K50019 Crohn's disease of small intestine with unspecified complications: Secondary | ICD-10-CM

## 2017-11-05 DIAGNOSIS — G8928 Other chronic postprocedural pain: Secondary | ICD-10-CM

## 2017-11-05 DIAGNOSIS — R109 Unspecified abdominal pain: Secondary | ICD-10-CM

## 2017-11-05 LAB — CBC WITH DIFFERENTIAL/PLATELET
Basophils Absolute: 0 10*3/uL (ref 0.0–0.1)
Basophils Relative: 0.1 % (ref 0.0–3.0)
EOS ABS: 0 10*3/uL (ref 0.0–0.7)
Eosinophils Relative: 0.1 % (ref 0.0–5.0)
HCT: 46 % (ref 39.0–52.0)
HEMOGLOBIN: 14.8 g/dL (ref 13.0–17.0)
Lymphocytes Relative: 5.9 % — ABNORMAL LOW (ref 12.0–46.0)
Lymphs Abs: 0.9 10*3/uL (ref 0.7–4.0)
MCHC: 32.2 g/dL (ref 30.0–36.0)
MCV: 93 fl (ref 78.0–100.0)
MONO ABS: 0.6 10*3/uL (ref 0.1–1.0)
Monocytes Relative: 3.8 % (ref 3.0–12.0)
Neutro Abs: 13.4 10*3/uL — ABNORMAL HIGH (ref 1.4–7.7)
Neutrophils Relative %: 90.1 % — ABNORMAL HIGH (ref 43.0–77.0)
Platelets: 408 10*3/uL — ABNORMAL HIGH (ref 150.0–400.0)
RBC: 4.94 Mil/uL (ref 4.22–5.81)
RDW: 16.9 % — AB (ref 11.5–15.5)
WBC: 14.9 10*3/uL — ABNORMAL HIGH (ref 4.0–10.5)

## 2017-11-05 LAB — COMPREHENSIVE METABOLIC PANEL
ALBUMIN: 3.3 g/dL — AB (ref 3.5–5.2)
ALK PHOS: 94 U/L (ref 39–117)
ALT: 13 U/L (ref 0–53)
AST: 11 U/L (ref 0–37)
BUN: 12 mg/dL (ref 6–23)
CO2: 24 mEq/L (ref 19–32)
CREATININE: 1.13 mg/dL (ref 0.40–1.50)
Calcium: 9.5 mg/dL (ref 8.4–10.5)
Chloride: 103 mEq/L (ref 96–112)
GFR: 71.21 mL/min (ref >60.00)
Glucose, Bld: 141 mg/dL — ABNORMAL HIGH (ref 70–99)
Potassium: 4.5 mEq/L (ref 3.5–5.1)
SODIUM: 138 meq/L (ref 135–145)
TOTAL PROTEIN: 7 g/dL (ref 6.0–8.3)
Total Bilirubin: 0.6 mg/dL (ref 0.2–1.2)

## 2017-11-05 MED ORDER — METRONIDAZOLE 500 MG PO TABS: 500.0000 mg | ORAL_TABLET | Freq: Three times a day (TID) | ORAL | 0 refills | Status: DC

## 2017-11-05 NOTE — Telephone Encounter (Signed)
Thank you :)

## 2017-11-05 NOTE — Telephone Encounter (Signed)
30 tablets. 1 refill

## 2017-11-05 NOTE — Telephone Encounter (Signed)
Would you like to continue to fill Tramadol? If so please advise on quantity and refills. Thank you

## 2017-11-07 LAB — CLOSTRIDIUM DIFFICILE EIA: C DIFFICILE TOXINS A+ B, EIA: NEGATIVE

## 2017-11-10 ENCOUNTER — Telehealth: Payer: Self-pay

## 2017-11-10 NOTE — Telephone Encounter (Signed)
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 He asked me to leave it at the front desk for him to pick up tomorrow vs mailing. He indicated that, so far, the Flagyl is really helping him to feel better for the first day in a long long time!

## 2017-11-10 NOTE — Telephone Encounter (Signed)
Thanks Jan, I appreciate it

## 2017-11-11 ENCOUNTER — Encounter: Payer: Self-pay | Admitting: Gastroenterology

## 2017-11-11 DIAGNOSIS — K5090 Crohn's disease, unspecified, without complications: Secondary | ICD-10-CM | POA: Diagnosis not present

## 2017-11-14 ENCOUNTER — Other Ambulatory Visit: Payer: Self-pay | Admitting: Gastroenterology

## 2017-12-15 DIAGNOSIS — K5090 Crohn's disease, unspecified, without complications: Secondary | ICD-10-CM | POA: Diagnosis not present

## 2018-01-10 ENCOUNTER — Encounter: Payer: Self-pay | Admitting: Gastroenterology

## 2018-01-10 ENCOUNTER — Ambulatory Visit (INDEPENDENT_AMBULATORY_CARE_PROVIDER_SITE_OTHER): Payer: Medicare Other | Admitting: Gastroenterology

## 2018-01-10 VITALS — BP 90/60 | HR 68 | Ht 71.0 in | Wt 122.0 lb

## 2018-01-10 DIAGNOSIS — M2550 Pain in unspecified joint: Secondary | ICD-10-CM | POA: Diagnosis not present

## 2018-01-10 DIAGNOSIS — K5080 Crohn's disease of both small and large intestine without complications: Secondary | ICD-10-CM

## 2018-01-10 DIAGNOSIS — E559 Vitamin D deficiency, unspecified: Secondary | ICD-10-CM

## 2018-01-10 DIAGNOSIS — Z933 Colostomy status: Secondary | ICD-10-CM | POA: Diagnosis not present

## 2018-01-10 DIAGNOSIS — Z23 Encounter for immunization: Secondary | ICD-10-CM

## 2018-01-10 DIAGNOSIS — K50819 Crohn's disease of both small and large intestine with unspecified complications: Secondary | ICD-10-CM | POA: Diagnosis not present

## 2018-01-10 MED ORDER — TRAMADOL HCL 50 MG PO TABS: 50.0000 mg | ORAL_TABLET | Freq: Three times a day (TID) | ORAL | 1 refills | Status: DC | PRN

## 2018-01-10 NOTE — Progress Notes (Signed)
HPI :  Chron's history Diagnosed with Crohn's disease in 57s. 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 He has a colostomy with a remnant rectal pouch. He reports he has had a bowel surgery in the 1982, with a "few more surgeries" in years after while in [place], as well as in [facility], they tried reversing his colostomy which did not workwell for him, too much diarrhea and it was then reversed back. He has a history of abscess associated with small bowel.   He has been hospitalized multiple times in recent years, He had been on chronic prednisone for 10 years with fluctuating levels. HewasCimzia for about 4-5 years in the past. He has been on sulfasalazine for a long time. He reports an allergy to 6MP - he does not know the details ofthe reaction. He had an allergic reaction - hives, to both Remicade and Humira.Allergy to methotrexate, now on Entyvio.  SINCE LAST VISIT  He had a flare of his Crohn's in January.  He had a CT scan at that time showing some thickened ileum concerning for active Crohn's disease.  He had a colonoscopy per Dr. Silverio Decamp while hospitalized showing a normal colon but with active inflammation at the ileocolonic anastomosis. Rutgeerts Score i4. He was given a course of prednisone with subsequent taper. He had Entyvio levels obtained - he had a level of 5.4 without any antibodies drawn on 10/13/2017. Entyvio dosing increased to once every 4 weeks since his last visit. He has completed prednisone taper. Increased vitamin D to 4000 IU / day. He tested negative for C diff.   Overall he's doing really well since his last visit with [place]. He thinks increased dosing frequency of Entyvio has helped. He has not had any abdominal pain. His stool frequency is stable, roughly 4 BM's per day without blood. He thinks he is slowly gaining weight. Eating well. More energy, now able to volunteer his time more, he is thinking of  going back to school. He does have some occasional joint pains in his hands, knees, and lower back. Had some used ultram with benefit. Not using NSAIDs.   He has not had Shingles vaccine. He thinks prior remote pneumovacc but it has been several years.   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 Acute endocarditis   . Crohn's disease ([place])   . Depression   . Eating disorder   . GERD (gastroesophageal reflux disease)   . Kidney stones   . Short gut syndrome      Past Surgical History:  Procedure Laterality Date  . COLONOSCOPY WITH PROPOFOL N/A 09/17/2017   Procedure: COLONOSCOPY WITH PROPOFOL;  Surgeon: [name], MD;  Location: [facility];  Service: Endoscopy;  Laterality: N/A;  Through ileostomy.  [name] WITH STENT PLACEMENT Left 05/20/2017   Procedure: CYSTOSCOPY WITH STENT PLACEMENT;  Surgeon: [name], MD;  Location: [facility];  Service: Urology;  Laterality: Left;  . ILEOSTOMY    . IR NEPHROSTOMY EXCHANGE LEFT  04/12/2017  . IR NEPHROSTOMY PLACEMENT LEFT  04/12/2017  . IR NEPHROSTOMY PLACEMENT LEFT  04/29/2017  . NEPHROLITHOTOMY Left 05/14/2017   Procedure: LEFT NEPHROLITHOTOMY PERCUTANEOUS WITH SURGEON ACCESS;  Surgeon: [name], MD;  Location: [facility];  Service: Urology;  Laterality: Left;  . SMALL INTESTINE SURGERY    . SUBTOTAL COLECTOMY     Family History  Problem Relation Age of Onset  . Heart disease Mother   . Diabetes Father   . Heart disease Father   . Heart attack Father   . Melanoma Sister   . Crohn's disease Brother   . Inflammatory bowel disease Brother   . Diabetes Paternal Grandmother    Social History   Tobacco Use  . Smoking status: Former Smoker    Packs/day: 1.00    Years: 20.00    Pack  years: 20.00    Types: Cigarettes    Last attempt to quit: 08/31/2006    Years since quitting: 11.3  . Smokeless tobacco: Never Used  Substance Use Topics  . Alcohol use: No  . Drug use: No   Current Outpatient Medications  Medication Sig Dispense Refill  . cholecalciferol (VITAMIN D) 400 units TABS tablet Take 400 Units by mouth daily.    . diphenoxylate-atropine (LOMOTIL) 2.5-0.025 MG tablet Take 1 tablet by mouth 4 (four) times daily as needed for diarrhea or loose stools. 120 tablet 0  . escitalopram (LEXAPRO) 10 MG tablet Take 10 mg by mouth daily.    . ferrous sulfate 325 (65 FE) MG tablet TAKE 1 TABLET (325 MG TOTAL) 2 (TWO) TIMES DAILY WITH A MEAL BY MOUTH. 120 tablet 1  . ipratropium (ATROVENT) 0.03 % nasal spray Place 2 sprays 2 (two) times daily into both nostrils. Can use 3 times a day as needed 30 mL 2  . Multiple Vitamin (MULTIVITAMIN WITH MINERALS) TABS tablet Take 1 tablet by mouth daily. 30 tablet 3  . ondansetron (ZOFRAN-ODT) 4 MG disintegrating tablet Take 1 tablet (4 mg total) by mouth every 8 (eight) hours as needed for nausea or vomiting. 30 tablet 0  . potassium chloride SA (KLOR-CON M20) 20 MEQ tablet Take 1 tablet by mouth daily 30 tablet 1  . ranitidine (ZANTAC) 150 MG tablet Take 1 tablet (150 mg total) by mouth 2 (two) times daily. 60 tablet 6  . sertraline (ZOLOFT) 50 MG tablet Take 1 tablet (50 mg total) by mouth daily. 90 tablet 1  . traMADol (ULTRAM) 50 MG tablet TAKE 1 TABLET BY MOUTH EVERY 8 HOURS AS NEEDED 30 tablet 1  . Vedolizumab (ENTYVIO IV) Inject into the vein.     No current facility-administered medications for this visit.    Allergies  Allergen Reactions  . Methotrexate Derivatives Other (See Comments)    Anemia, low WBC, severe GI symptoms,   . Humira [Adalimumab] Other (See Comments)    Intolerance  . Penicillins Other (See Comments)    REACTION DURING SURGERY Has patient had a PCN reaction causing immediate rash, facial/tongue/throat  swelling, SOB or lightheadedness with hypotension: Unknown Has patient had a PCN reaction causing severe rash involving mucus membranes or skin necrosis: Unknown Has patient had a PCN reaction that required hospitalization: Unknown Has patient had a PCN reaction occurring within the last 10 years: No If all of the above answers are "NO", then may proceed with Cephalosporin use.   . Remicade [Infliximab] Other (See Comments)    Intolerance     Review of Systems: All systems reviewed and negative except where noted in HPI.   Lab Results  Component Value Date   WBC 14.9 (H) 11/05/2017   HGB 14.8 11/05/2017   HCT 46.0 11/05/2017   MCV 93.0 11/05/2017   PLT 408.0 (H) 11/05/2017    Lab  Results  Component Value Date   CREATININE 1.13 11/05/2017   BUN 12 11/05/2017   NA 138 11/05/2017   K 4.5 11/05/2017   CL 103 11/05/2017   CO2 24 11/05/2017    Lab Results  Component Value Date   ALT 13 11/05/2017   AST 11 11/05/2017   ALKPHOS 94 11/05/2017   BILITOT 0.6 11/05/2017     Physical Exam: BP 90/60   Pulse 68   Ht 5' 11"  (1.803 m)   Wt 122 lb (55.3 kg)   BMI 17.02 kg/m  Constitutional: Pleasant,well-developed, male in no acute distress. HEENT: Normocephalic and atraumatic. Conjunctivae are normal. No scleral icterus. Neck supple.  Cardiovascular: Normal rate, regular rhythm.  Pulmonary/chest: Effort normal and breath sounds normal. No wheezing, rales or rhonchi. Abdominal: Soft, nondistended, nontender. Colostomy in LLQ. There are no masses palpable. No hepatomegaly. Extremities: no edema Lymphadenopathy: No cervical adenopathy noted. Neurological: Alert and oriented to person place and time. Skin: Skin is warm and dry. No rashes noted. Psychiatric: Normal mood and affect. Behavior is normal.   ASSESSMENT AND PLAN: 57 year old male with severe penetrating/stricturing Crohn's disease with history of multiple small bowel resections and associated short gut syndrome.   See history as above - intolerant to multiple anti-TNFs and 6-MP/methotrexate.  On Entyvio monotherapy which has helped him since initially started last year, but admitted for flare of disease in January. Found to have subtherapeutic Entyvio levels at that time, now on increased frequency of dosing evert 4 weeks. He's feeling better at this time, although having some joint pains.   Recommend: - continuing Entyvio dosing every 4 weeks - due for basic labs - CBC, CMET, and vitamin D - he will have this done when getting Entyvio infused on May 15th - will give small refill of Tramadol PRN for joint pains which works well for him. He can also use tylenol PRN and avoid NSAIDs - gave PCV 13 vaccine today - recommended shingles vaccine, unfortunately not available in clinic right now - may repeat a colonoscopy in 3-4 months to look at the anastomosis when he has had more time on higher dose Entyvio, assess for mucosal healing - continue vitamin D 4000 IU / day - follow up in the clinic in 3 months for reassessment.   Patient agrees with the plan as outlined, he will call in the interim with any questions / changes.    Cellar, MD [facility]

## 2018-01-10 NOTE — Patient Instructions (Addendum)
If you are age 57 or older, your body mass index should be between 23-30. Your Body mass index is 17.02 kg/m. If this is out of the aforementioned range listed, please consider follow up with your Primary Care Provider.  If you are age 58 or younger, your body mass index should be between 19-25. Your Body mass index is 17.02 kg/m. If this is out of the aformentioned range listed, please consider follow up with your Primary Care Provider.   We have sent the following medications to your pharmacy for you to pick up at your convenience: Tramadol  We have given you a Pneumococcal 13 vaccine today.  We would like to see you back in the office in August.  We will send you a reminder when that schedule is open.  Thank you for entrusting me with your care and for choosing [facility], Dr. Franklin Cellar

## 2018-01-12 DIAGNOSIS — K5090 Crohn's disease, unspecified, without complications: Secondary | ICD-10-CM | POA: Diagnosis not present

## 2018-01-12 DIAGNOSIS — E559 Vitamin D deficiency, unspecified: Secondary | ICD-10-CM | POA: Diagnosis not present

## 2018-01-12 DIAGNOSIS — D649 Anemia, unspecified: Secondary | ICD-10-CM | POA: Diagnosis not present

## 2018-01-12 DIAGNOSIS — Z79899 Other long term (current) drug therapy: Secondary | ICD-10-CM | POA: Diagnosis not present

## 2018-01-17 ENCOUNTER — Encounter (HOSPITAL_COMMUNITY): Payer: Self-pay | Admitting: Emergency Medicine

## 2018-01-17 ENCOUNTER — Telehealth: Payer: Self-pay | Admitting: Gastroenterology

## 2018-01-17 ENCOUNTER — Emergency Department (HOSPITAL_COMMUNITY): Payer: Medicare Other

## 2018-01-17 ENCOUNTER — Inpatient Hospital Stay (HOSPITAL_COMMUNITY)
Admission: EM | Admit: 2018-01-17 | Discharge: 2018-01-20 | DRG: 385 | Disposition: A | Discharge status: Home / Self Care | Payer: Medicare Other | Attending: Internal Medicine | Admitting: Internal Medicine

## 2018-01-17 DIAGNOSIS — Z808 Family history of malignant neoplasm of other organs or systems: Secondary | ICD-10-CM

## 2018-01-17 DIAGNOSIS — Z8249 Family history of ischemic heart disease and other diseases of the circulatory system: Secondary | ICD-10-CM | POA: Diagnosis not present

## 2018-01-17 DIAGNOSIS — Z66 Do not resuscitate: Secondary | ICD-10-CM | POA: Diagnosis present

## 2018-01-17 DIAGNOSIS — Z681 Body mass index (BMI) 19 or less, adult: Secondary | ICD-10-CM | POA: Diagnosis not present

## 2018-01-17 DIAGNOSIS — Z933 Colostomy status: Secondary | ICD-10-CM

## 2018-01-17 DIAGNOSIS — D649 Anemia, unspecified: Secondary | ICD-10-CM | POA: Diagnosis present

## 2018-01-17 DIAGNOSIS — R112 Nausea with vomiting, unspecified: Secondary | ICD-10-CM

## 2018-01-17 DIAGNOSIS — R42 Dizziness and giddiness: Secondary | ICD-10-CM

## 2018-01-17 DIAGNOSIS — R1084 Generalized abdominal pain: Secondary | ICD-10-CM

## 2018-01-17 DIAGNOSIS — Z880 Allergy status to penicillin: Secondary | ICD-10-CM | POA: Diagnosis not present

## 2018-01-17 DIAGNOSIS — K5080 Crohn's disease of both small and large intestine without complications: Secondary | ICD-10-CM | POA: Diagnosis not present

## 2018-01-17 DIAGNOSIS — Z87442 Personal history of urinary calculi: Secondary | ICD-10-CM | POA: Diagnosis not present

## 2018-01-17 DIAGNOSIS — Z8659 Personal history of other mental and behavioral disorders: Secondary | ICD-10-CM

## 2018-01-17 DIAGNOSIS — K5090 Crohn's disease, unspecified, without complications: Secondary | ICD-10-CM

## 2018-01-17 DIAGNOSIS — F329 Major depressive disorder, single episode, unspecified: Secondary | ICD-10-CM

## 2018-01-17 DIAGNOSIS — M2550 Pain in unspecified joint: Secondary | ICD-10-CM

## 2018-01-17 DIAGNOSIS — R001 Bradycardia, unspecified: Secondary | ICD-10-CM | POA: Diagnosis present

## 2018-01-17 DIAGNOSIS — Z833 Family history of diabetes mellitus: Secondary | ICD-10-CM

## 2018-01-17 DIAGNOSIS — Z79891 Long term (current) use of opiate analgesic: Secondary | ICD-10-CM

## 2018-01-17 DIAGNOSIS — K912 Postsurgical malabsorption, not elsewhere classified: Secondary | ICD-10-CM | POA: Diagnosis present

## 2018-01-17 DIAGNOSIS — E860 Dehydration: Secondary | ICD-10-CM | POA: Diagnosis not present

## 2018-01-17 DIAGNOSIS — K5000 Crohn's disease of small intestine without complications: Secondary | ICD-10-CM | POA: Diagnosis present

## 2018-01-17 DIAGNOSIS — K50819 Crohn's disease of both small and large intestine with unspecified complications: Secondary | ICD-10-CM | POA: Diagnosis present

## 2018-01-17 DIAGNOSIS — R109 Unspecified abdominal pain: Secondary | ICD-10-CM | POA: Diagnosis not present

## 2018-01-17 DIAGNOSIS — E43 Unspecified severe protein-calorie malnutrition: Secondary | ICD-10-CM | POA: Diagnosis present

## 2018-01-17 DIAGNOSIS — N179 Acute kidney failure, unspecified: Secondary | ICD-10-CM | POA: Diagnosis not present

## 2018-01-17 DIAGNOSIS — D72819 Decreased white blood cell count, unspecified: Secondary | ICD-10-CM | POA: Diagnosis not present

## 2018-01-17 DIAGNOSIS — Z888 Allergy status to other drugs, medicaments and biological substances status: Secondary | ICD-10-CM

## 2018-01-17 DIAGNOSIS — F32A Depression, unspecified: Secondary | ICD-10-CM | POA: Diagnosis present

## 2018-01-17 DIAGNOSIS — R636 Underweight: Secondary | ICD-10-CM | POA: Diagnosis present

## 2018-01-17 DIAGNOSIS — R197 Diarrhea, unspecified: Secondary | ICD-10-CM

## 2018-01-17 DIAGNOSIS — Z79899 Other long term (current) drug therapy: Secondary | ICD-10-CM

## 2018-01-17 DIAGNOSIS — Z87891 Personal history of nicotine dependence: Secondary | ICD-10-CM

## 2018-01-17 DIAGNOSIS — Z9049 Acquired absence of other specified parts of digestive tract: Secondary | ICD-10-CM

## 2018-01-17 DIAGNOSIS — R1110 Vomiting, unspecified: Secondary | ICD-10-CM | POA: Diagnosis not present

## 2018-01-17 DIAGNOSIS — K219 Gastro-esophageal reflux disease without esophagitis: Secondary | ICD-10-CM | POA: Diagnosis present

## 2018-01-17 LAB — CBC
HCT: 44.7 % (ref 39.0–52.0)
Hemoglobin: 14.7 g/dL (ref 13.0–17.0)
MCH: 29.6 pg (ref 26.0–34.0)
MCHC: 32.9 g/dL (ref 30.0–36.0)
MCV: 89.9 fL (ref 78.0–100.0)
PLATELETS: 203 10*3/uL (ref 150–400)
RBC: 4.97 MIL/uL (ref 4.22–5.81)
RDW: 16.6 % — ABNORMAL HIGH (ref 11.5–15.5)
WBC: 5.4 10*3/uL (ref 4.0–10.5)

## 2018-01-17 LAB — LIPASE, BLOOD: LIPASE: 60 U/L — AB (ref 11–51)

## 2018-01-17 LAB — TROPONIN I: Troponin I: <0.03 ng/mL (ref <0.03)

## 2018-01-17 LAB — COMPREHENSIVE METABOLIC PANEL
ALBUMIN: 3.4 g/dL — AB (ref 3.5–5.0)
ALT: 15 U/L — AB (ref 17–63)
AST: 21 U/L (ref 15–41)
Alkaline Phosphatase: 71 U/L (ref 38–126)
Anion gap: 11 (ref 5–15)
BUN: 13 mg/dL (ref 6–20)
CO2: 24 mmol/L (ref 22–32)
CREATININE: 1.4 mg/dL — AB (ref 0.61–1.24)
Calcium: 9.4 mg/dL (ref 8.9–10.3)
Chloride: 107 mmol/L (ref 101–111)
GFR calc non Af Amer: 55 mL/min — ABNORMAL LOW (ref >60)
GLUCOSE: 115 mg/dL — AB (ref 65–99)
Potassium: 3.7 mmol/L (ref 3.5–5.1)
SODIUM: 142 mmol/L (ref 135–145)
Total Bilirubin: 0.9 mg/dL (ref 0.3–1.2)
Total Protein: 7.7 g/dL (ref 6.5–8.1)

## 2018-01-17 LAB — C DIFFICILE QUICK SCREEN W PCR REFLEX
C DIFFICILE (CDIFF) TOXIN: NEGATIVE
C Diff antigen: NEGATIVE
C Diff interpretation: NOT DETECTED

## 2018-01-17 MED ORDER — ENOXAPARIN SODIUM 40 MG/0.4ML ~~LOC~~ SOLN
40.0000 mg | SUBCUTANEOUS | Status: DC
  Administered 2018-01-17 – 2018-01-19 (×3): 40 mg via SUBCUTANEOUS
  Filled 2018-01-17 (×4): qty 0.4

## 2018-01-17 MED ORDER — CIPROFLOXACIN IN D5W 400 MG/200ML IV SOLN
400.0000 mg | Freq: Two times a day (BID) | INTRAVENOUS | Status: DC
  Administered 2018-01-17 – 2018-01-19 (×4): 400 mg via INTRAVENOUS
  Filled 2018-01-17 (×6): qty 200

## 2018-01-17 MED ORDER — METRONIDAZOLE IN NACL 5-0.79 MG/ML-% IV SOLN
500.0000 mg | Freq: Three times a day (TID) | INTRAVENOUS | Status: DC
  Administered 2018-01-17 – 2018-01-19 (×6): 500 mg via INTRAVENOUS
  Filled 2018-01-17 (×6): qty 100

## 2018-01-17 MED ORDER — ONDANSETRON HCL 4 MG/2ML IJ SOLN
4.0000 mg | Freq: Four times a day (QID) | INTRAMUSCULAR | Status: DC
  Administered 2018-01-17 – 2018-01-20 (×10): 4 mg via INTRAVENOUS
  Filled 2018-01-17 (×13): qty 2

## 2018-01-17 MED ORDER — MORPHINE SULFATE (PF) 4 MG/ML IV SOLN
4.0000 mg | Freq: Once | INTRAVENOUS | Status: AC
  Administered 2018-01-17: 4 mg via INTRAVENOUS
  Filled 2018-01-17: qty 1

## 2018-01-17 MED ORDER — KCL IN DEXTROSE-NACL 40-5-0.9 MEQ/L-%-% IV SOLN
INTRAVENOUS | Status: DC
  Administered 2018-01-17 – 2018-01-20 (×8): via INTRAVENOUS
  Filled 2018-01-17 (×9): qty 1000

## 2018-01-17 MED ORDER — SODIUM CHLORIDE 0.9 % IV BOLUS
2000.0000 mL | Freq: Once | INTRAVENOUS | Status: AC
  Administered 2018-01-17: 2000 mL via INTRAVENOUS

## 2018-01-17 MED ORDER — FENTANYL CITRATE (PF) 100 MCG/2ML IJ SOLN
50.0000 ug | Freq: Once | INTRAMUSCULAR | Status: AC
  Administered 2018-01-17: 50 ug via INTRAVENOUS
  Filled 2018-01-17: qty 2

## 2018-01-17 MED ORDER — ONDANSETRON HCL 4 MG/2ML IJ SOLN: 4.0000 mg | Freq: Four times a day (QID) | INTRAMUSCULAR | Status: DC | PRN

## 2018-01-17 MED ORDER — FAMOTIDINE IN NACL 20-0.9 MG/50ML-% IV SOLN
20.0000 mg | Freq: Two times a day (BID) | INTRAVENOUS | Status: DC
  Administered 2018-01-17 – 2018-01-20 (×6): 20 mg via INTRAVENOUS
  Filled 2018-01-17 (×6): qty 50

## 2018-01-17 MED ORDER — IOPAMIDOL (ISOVUE-300) INJECTION 61%
100.0000 mL | Freq: Once | INTRAVENOUS | Status: AC | PRN
  Administered 2018-01-17: 100 mL via INTRAVENOUS

## 2018-01-17 MED ORDER — METHYLPREDNISOLONE SODIUM SUCC 40 MG IJ SOLR
40.0000 mg | Freq: Four times a day (QID) | INTRAMUSCULAR | Status: DC
  Administered 2018-01-17 – 2018-01-18 (×4): 40 mg via INTRAVENOUS
  Filled 2018-01-17 (×4): qty 1

## 2018-01-17 MED ORDER — PROMETHAZINE HCL 25 MG/ML IJ SOLN
25.0000 mg | Freq: Four times a day (QID) | INTRAMUSCULAR | Status: DC | PRN
  Administered 2018-01-18: 25 mg via INTRAVENOUS
  Filled 2018-01-17 (×2): qty 1

## 2018-01-17 MED ORDER — HYDROMORPHONE HCL 1 MG/ML IJ SOLN
1.0000 mg | INTRAMUSCULAR | Status: DC | PRN
  Administered 2018-01-18 (×2): 1 mg via INTRAVENOUS
  Filled 2018-01-17 (×2): qty 1

## 2018-01-17 MED ORDER — IOPAMIDOL (ISOVUE-300) INJECTION 61%
INTRAVENOUS | Status: AC
  Filled 2018-01-17: qty 100

## 2018-01-17 MED ORDER — ONDANSETRON HCL 4 MG PO TABS: 4.0000 mg | ORAL_TABLET | Freq: Four times a day (QID) | ORAL | Status: DC | PRN

## 2018-01-17 MED ORDER — SERTRALINE HCL 50 MG PO TABS
50.0000 mg | ORAL_TABLET | Freq: Every day | ORAL | Status: DC
Start: 2018-01-17 — End: 2018-01-20
  Administered 2018-01-17 – 2018-01-20 (×4): 50 mg via ORAL
  Filled 2018-01-17 (×4): qty 1

## 2018-01-17 MED ORDER — OXYCODONE HCL 5 MG PO TABS
7.5000 mg | ORAL_TABLET | ORAL | Status: DC | PRN
  Administered 2018-01-17 – 2018-01-20 (×7): 7.5 mg via ORAL
  Filled 2018-01-17 (×8): qty 2

## 2018-01-17 MED ORDER — SODIUM CHLORIDE 0.9 % IV SOLN: INTRAVENOUS | Status: DC

## 2018-01-17 MED ORDER — ONDANSETRON HCL 4 MG/2ML IJ SOLN
4.0000 mg | Freq: Once | INTRAMUSCULAR | Status: AC
  Administered 2018-01-17: 4 mg via INTRAVENOUS
  Filled 2018-01-17: qty 2

## 2018-01-17 MED ORDER — SODIUM CHLORIDE 0.9 % IV BOLUS
1000.0000 mL | Freq: Once | INTRAVENOUS | Status: AC
  Administered 2018-01-17: 1000 mL via INTRAVENOUS

## 2018-01-17 MED ORDER — ONDANSETRON HCL 4 MG PO TABS
4.0000 mg | ORAL_TABLET | Freq: Four times a day (QID) | ORAL | Status: DC
  Administered 2018-01-20: 4 mg via ORAL
  Filled 2018-01-17: qty 1

## 2018-01-17 NOTE — ED Provider Notes (Signed)
[facility] DEPT Provider Note   CSN: [id number] Arrival date & time: 01/17/18  1316     History   Chief Complaint Chief Complaint  Patient presents with  . Abdominal Pain    HPI Gregory [name]. is a 57 y.o. male with a PMHx of Crohn's disease s/p subtotal colectomy with colostomy and ileostomy, short gut syndrome, kidney stones, and endocarditis, who presents to the ED with complaints of "feeling dehydrated".  Patient states that he received a pneumonia vaccine on 01/11/2018 and shortly after he started not feeling well.  The next day on 01/12/2018 he had his Entyvio infusion for his crohn's.  Since then he's continued to not feel well, stating that he has been having nausea and 2 episodes daily of nonbloody nonbilious emesis, lack of appetite, chills, and feeling dehydrated with lightheadedness with activities or standing up.  He also has generalized myalgias and generalized weakness/fatigue.  He has also had 8/10 intermittent crampy nonradiating generalized abdominal pain with no known aggravating or alleviating factors, he has not tried anything at home for his pain because he was afraid that he would vomit up his usual tramadol.  He also noticed an increase in his stool, stating that he has had 5 episodes daily of nonbloody diarrhea, which is more than usual (though he states he has chronic diarrhea/loose stools).  He states this all feels like prior crohn's flare, his last one was in January 2019 when he was hospitalized for very similar symptoms.  His GI specialist is Dr. Havery Moros at [facility].  His PCP is Dr. Volanda Napoleon at [facility].    He denies fevers, URI symptoms, cough, CP, SOB, constipation, obstipation, melena, hematochezia, hematemesis, hematuria, dysuria, focal arthralgias, numbness, tingling, focal weakness, or any other complaints at this time. Denies recent travel, sick contacts, suspicious food intake, EtOH use, or frequent NSAID use.   The  history is provided by the patient and medical records. No language interpreter was used.  Abdominal Pain   Associated symptoms include diarrhea, nausea, vomiting and myalgias. Pertinent negatives include fever, constipation, dysuria, hematuria and arthralgias.    Past Medical History:  Diagnosis Date  . Acute endocarditis   . Crohn's disease ([place])   . Depression   . Eating disorder   . GERD (gastroesophageal reflux disease)   . Kidney stones   . Short gut syndrome     Patient Active Problem List   Diagnosis Date Noted  . Hypokalemia 09/15/2017  . AKI (acute kidney injury) ([place]) 09/15/2017  . Hypomagnesemia 09/15/2017  . Crohn's disease of ileum without complication ([place]) 09/32/6712  . Anemia 05/18/2017  . Protein-calorie malnutrition, severe 04/30/2017  . Anuresis 04/28/2017  . Physical deconditioning 04/28/2017  . Protein calorie malnutrition ([place]) 04/28/2017  . Abdominal pain, left upper quadrant 04/28/2017  . GERD (gastroesophageal reflux disease) 04/28/2017  . Nephrostomy complication ([place]) 45/80/9983  . Chronic pain 04/28/2017  . Depression 04/28/2017  . Nausea & vomiting   . Crohn's disease of both small and large intestine without complication ([place])   . Nephrolithiasis 04/10/2017    Past Surgical History:  Procedure Laterality Date  . COLONOSCOPY WITH PROPOFOL N/A 09/17/2017   Procedure: COLONOSCOPY WITH PROPOFOL;  Surgeon: [name], MD;  Location: [facility];  Service: Endoscopy;  Laterality: N/A;  Through ileostomy.  [name] WITH STENT PLACEMENT Left 05/20/2017   Procedure: CYSTOSCOPY WITH STENT PLACEMENT;  Surgeon: [name], MD;  Location: [facility] ORS;  Service: Urology;  Laterality: Left;  .  ILEOSTOMY    . IR NEPHROSTOMY EXCHANGE LEFT  04/12/2017  . IR NEPHROSTOMY PLACEMENT LEFT  04/12/2017  . IR NEPHROSTOMY PLACEMENT LEFT  04/29/2017  . NEPHROLITHOTOMY Left 05/14/2017   Procedure: LEFT NEPHROLITHOTOMY PERCUTANEOUS WITH SURGEON ACCESS;   Surgeon: [name], MD;  Location: [facility];  Service: Urology;  Laterality: Left;  . SMALL INTESTINE SURGERY    . SUBTOTAL COLECTOMY          Home Medications    Prior to Admission medications   Medication Sig Start Date End Date Taking? Authorizing Provider  cholecalciferol (VITAMIN D) 400 units TABS tablet Take 400 Units by mouth daily.    [provider]  diphenoxylate-atropine (LOMOTIL) 2.5-0.025 MG tablet Take 1 tablet by mouth 4 (four) times daily as needed for diarrhea or loose stools. 09/18/17   Thurnell Lose, MD  escitalopram (LEXAPRO) 10 MG tablet Take 10 mg by mouth daily.    [provider]  ferrous sulfate 325 (65 FE) MG tablet TAKE 1 TABLET (325 MG TOTAL) 2 (TWO) TIMES DAILY WITH A MEAL BY MOUTH. 11/15/17   Armbruster, Carlota Raspberry, MD  ipratropium (ATROVENT) 0.03 % nasal spray Place 2 sprays 2 (two) times daily into both nostrils. Can use 3 times a day as needed 07/12/17   Armbruster, Carlota Raspberry, MD  Multiple Vitamin (MULTIVITAMIN WITH MINERALS) TABS tablet Take 1 tablet by mouth daily. 05/03/17   Rai, Ripudeep K, MD  ondansetron (ZOFRAN-ODT) 4 MG disintegrating tablet Take 1 tablet (4 mg total) by mouth every 8 (eight) hours as needed for nausea or vomiting. 10/01/17   Zehr, Laban Emperor, PA-C  potassium chloride SA (KLOR-CON M20) 20 MEQ tablet Take 1 tablet by mouth daily 11/03/17   Armbruster, Carlota Raspberry, MD  ranitidine (ZANTAC) 150 MG tablet Take 1 tablet (150 mg total) by mouth 2 (two) times daily. 03/09/17   Armbruster, Carlota Raspberry, MD  sertraline (ZOLOFT) 50 MG tablet Take 1 tablet (50 mg total) by mouth daily. 08/26/17   Billie Ruddy, MD  traMADol (ULTRAM) 50 MG tablet Take 1 tablet (50 mg total) by mouth every 8 (eight) hours as needed. 01/10/18   Armbruster, Carlota Raspberry, MD  Vedolizumab (ENTYVIO IV) Inject into the vein.    [provider]    Family History Family History  Problem Relation Age of Onset  . Heart disease Mother   . Diabetes Father    . Heart disease Father   . Heart attack Father   . Melanoma Sister   . Crohn's disease Brother   . Inflammatory bowel disease Brother   . Diabetes Paternal Grandmother     Social History Social History   Tobacco Use  . Smoking status: Former Smoker    Packs/day: 1.00    Years: 20.00    Pack years: 20.00    Types: Cigarettes    Last attempt to quit: 08/31/2006    Years since quitting: 11.3  . Smokeless tobacco: Never Used  Substance Use Topics  . Alcohol use: No  . Drug use: No     Allergies   Methotrexate derivatives; Humira [adalimumab]; Penicillins; and Remicade [infliximab]   Review of Systems Review of Systems  Constitutional: Positive for appetite change, chills and fatigue (feels dehydrated). Negative for fever.  HENT: Negative for rhinorrhea and sore throat.   Respiratory: Negative for cough and shortness of breath.   Cardiovascular: Negative for chest pain.  Gastrointestinal: Positive for abdominal pain, diarrhea, nausea and vomiting. Negative for blood in  stool and constipation.  Genitourinary: Negative for dysuria and hematuria.  Musculoskeletal: Positive for myalgias. Negative for arthralgias.  Skin: Negative for color change.  Allergic/Immunologic: Positive for immunocompromised state (on immune modifying drugs).  Neurological: Positive for light-headedness (with activity). Negative for weakness and numbness.  Psychiatric/Behavioral: Negative for confusion.   All other systems reviewed and are negative for acute change except as noted in the HPI.    Physical Exam Updated Vital Signs BP 98/84   Pulse 83   Temp 97.8 F (36.6 C) (Oral)   Resp 16   SpO2 100%   Physical Exam  Constitutional: He is oriented to person, place, and time. Vital signs are normal. He appears well-developed. He appears cachectic.  Non-toxic appearance. No distress.  Afebrile, nontoxic, NAD, thin and frail appearing, looks older than stated age, somewhat cachectic appearing. BP  soft 90s/80s but appears to be fairly similar to prior visits  HENT:  Head: Normocephalic and atraumatic.  Mouth/Throat: Oropharynx is clear and moist. Mucous membranes are dry.  Lips dry  Eyes: Conjunctivae and EOM are normal. Right eye exhibits no discharge. Left eye exhibits no discharge.  Neck: Normal range of motion. Neck supple.  Cardiovascular: Normal rate, regular rhythm, normal heart sounds and intact distal pulses. Exam reveals no gallop and no friction rub.  No murmur heard. Pulmonary/Chest: Effort normal and breath sounds normal. No respiratory distress. He has no decreased breath sounds. He has no wheezes. He has no rhonchi. He has no rales.  Abdominal: Soft. Normal appearance and bowel sounds are normal. He exhibits no distension. There is tenderness in the right upper quadrant and right lower quadrant. There is guarding. There is no rigidity, no rebound, no CVA tenderness, no tenderness at McBurney's point and negative Murphy's sign.  Soft, nondistended and actually somewhat sunken in, +BS throughout, with moderate R sided abd TTP and some slight voluntary guarding, no rebound/rigidity, neg murphy's, no focal mcburney's point TTP, no CVA TTP. Colostomy bag noted in LLQ  Musculoskeletal: Normal range of motion.  Neurological: He is alert and oriented to person, place, and time. He has normal strength. No sensory deficit.  Skin: Skin is warm, dry and intact. No rash noted.  Psychiatric: He has a normal mood and affect.  Nursing note and vitals reviewed.    ED Treatments / Results  Labs (all labs ordered are listed, but only abnormal results are displayed) Labs Reviewed  LIPASE, BLOOD - Abnormal; Notable for the following components:      Result Value   Lipase 60 (*)    All other components within normal limits  COMPREHENSIVE METABOLIC PANEL - Abnormal; Notable for the following components:   Glucose, Bld 115 (*)    Creatinine, Ser 1.40 (*)    Albumin 3.4 (*)    ALT 15 (*)     GFR calc non Af Amer 55 (*)    All other components within normal limits  CBC - Abnormal; Notable for the following components:   RDW 16.6 (*)    All other components within normal limits  TROPONIN I  URINALYSIS, ROUTINE W REFLEX MICROSCOPIC    EKG EKG Interpretation  Date/Time:  Monday Jan 17 2018 15:14:14 EDT Ventricular Rate:  82 PR Interval:    QRS Duration: 89 QT Interval:  381 QTC Calculation: 445 R Axis:   84 Text Interpretation:  Sinus rhythm Atrial premature complex Low voltage, precordial leads Confirmed by Davonna Belling [phone]) on 01/17/2018 3:45:35 PM   Radiology Ct Abdomen Pelvis W  Contrast  Result Date: 01/17/2018 CLINICAL DATA:  Right lower quadrant abdominal pain, nausea/vomiting/diarrhea, history of Crohn's disease EXAM: CT ABDOMEN AND PELVIS WITH CONTRAST TECHNIQUE: Multidetector CT imaging of the abdomen and pelvis was performed using the standard protocol following bolus administration of intravenous contrast. CONTRAST:  133m ISOVUE-300 IOPAMIDOL (ISOVUE-300) INJECTION 61% COMPARISON:  09/14/2017 FINDINGS: Lower chest: Mild subpleural scarring/atelectasis in the left lower lobe. Hepatobiliary: Liver is within normal limits. Status post cholecystectomy.  No intrahepatic ductal dilatation. Mildly prominent common duct, measuring 8 mm, unchanged and likely postsurgical. Pancreas: Within normal limits. Spleen: Within normal limits. Adrenals/Urinary Tract: Adrenal glands are within normal limits. Small bilateral renal cysts, measuring up to 10 mm in the medial left upper kidney (series 7/image 15), likely benign. Bladder is within normal limits. Stomach/Bowel: Stomach is within normal limits. 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 No evidence of bowel obstruction. Mild wall thickening involving a loop of ileum in the left mid abdomen (series 2/image 53), with pericolonic inflammatory changes. This appearance is  significantly improved from the prior, although active inflammatory Crohn's disease persists. Vascular/Lymphatic: No evidence of abdominal aortic aneurysm. No suspicious abdominopelvic lymphadenopathy. Reproductive: Prostate is unremarkable. Other: No abdominopelvic ascites. Musculoskeletal: Mild degenerative changes at L5-S1. IMPRESSION: Mild inflammatory changes involving a loop of ileum in the left mid abdomen, reflecting active inflammatory Crohn's disease, significantly improved from the prior. 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 No evidence of bowel obstruction. Additional stable ancillary findings as above. Electronically Signed   By: SJulian HyM.D.   On: 01/17/2018 16:53    Procedures Procedures (including critical care time)  Medications Ordered in ED Medications  iopamidol (ISOVUE-300) 61 % injection (has no administration in time range)  sodium chloride 0.9 % bolus 1,000 mL (1,000 mLs Intravenous New Bag/Given 01/17/18 1741)  sodium chloride 0.9 % bolus 2,000 mL (0 mLs Intravenous Stopped 01/17/18 1651)  ondansetron (ZOFRAN) injection 4 mg (4 mg Intravenous Given 01/17/18 1515)  fentaNYL (SUBLIMAZE) injection 50 mcg (50 mcg Intravenous Given 01/17/18 1515)  iopamidol (ISOVUE-300) 61 % injection 100 mL (100 mLs Intravenous Contrast Given 01/17/18 1615)  morphine 4 MG/ML injection 4 mg (4 mg Intravenous Given 01/17/18 1741)     Initial Impression / Assessment and Plan / ED Course  I have reviewed the triage vital signs and the nursing notes.  Pertinent labs & imaging results that were available during my care of the patient were reviewed by me and considered in my medical decision making (see chart for details).     56y.o. male here with abd pain/n/v/d, myalgias, lack of appetite, chills, and feeling dehydrated/lightheaded x6 days. Had PNA vaccine then started feeling unwell, next day had Entyvio infusion. Wasn't sure if this could have triggered a  crohn's flare, states it feels like prior crohn's flare. On exam, thin and frail appearing, appears older than stated age, BP soft in the 90s/80s but similar to prior values, dry lips, abd with diffuse right sided TTP and voluntary guarding, no rebound/rigidity, colostomy in place in LLQ. Work up thus far reveals: CBC WNL although I suspect this is hemoconcentrated since his H/H is higher than usual; otherwise remainder of labs pending. Will await CMP, lipase, U/A, and obtain Trop and EKG, as well as CT abd/pelv to evaluate for crohn's flare. Will give fentanyl for now as to avoid dropping BP, as well as fluids and zofran, then reassess shortly.   3:44 PM CMP with elevated Cr  1.4 which is up from baseline of ~1.0-1.1, otherwise the rest is fairly unremarkable/nonacute. Lipase with borderline elevation at 60 but doubt clinical significance, probably just slightly elevated from vomiting. Trop negative. EKG nonischemic and fairly unremarkable. Awaiting U/A and CT. BP improving with fluids. Will continue to monitor and reassess once CT is back.   5:36 PM U/A not yet done, pt still doesn't feel like he needs to urinate despite receiving 2L fluids; he still feels dehydrated, and still appears somewhat dry; will give another 1L bolus. CT abd/pelv with mild inflammatory changes involving loop of ileum in left mid abdomen reflecting active inflammatory crohn's disease, improved from last CT but still representing active crohn's. Pt feeling just marginally better, but not much. He still feels fairly weak and has pain in his abdomen. BP still stable in the 100s/80s. Given his active crohn's and AKI and dehydration, with somewhat persistent/intractable symptoms, will proceed with admission. Pt doesn't feel well enough to go home either, and I suspect if we tried to send him home, he'd likely further deteriorate and end up coming back. Will admit. More pain meds are ordered.   5:47 PM Dr. Wynelle Cleveland of [facility] returning page  and will admit. Would like me to touch base with GI regarding recommendations as far as steroids/abx. Will consult them. Holding orders to be placed by admitting team.   6:07 PM Dr. Fuller Plan of [facility] returning page, recommends stool cultures/GI panel/C.diff testing but does NOT WANT TO START STEROIDS OR ANTIBIOTICS AT THIS TIME; he will see pt in consultation while he's admitted. Remainder of orders will be placed by admission team. Please see their notes for further documentation of care. I appreciate their help with this pleasant pt's care. Pt stable at time of admission.    Final Clinical Impressions(s) / ED Diagnoses   Final diagnoses:  Acute Crohn's disease without complication ([place])  Generalized abdominal pain  Nausea vomiting and diarrhea  AKI (acute kidney injury) [facility])  Dehydration  Lightheadedness    ED Discharge Orders    [address], [place], [place] 01/17/18 Edison Simon, MD 01/18/18 1459

## 2018-01-17 NOTE — Telephone Encounter (Signed)
Sorry to hear this, I thought he was doing okay when I saw him last week. Not sure if he is having a Crohn's flare or something else going on. Will await his ED visit as it looks like he is already there. If he continues to flare through high dose Entyvio will need to address his regimen.

## 2018-01-17 NOTE — Progress Notes (Signed)
Patient arrived to unit. Alert and oriented, able to transfer self. Safety and room orientation completed with patient expressing understanding.

## 2018-01-17 NOTE — Telephone Encounter (Signed)
Called patient back, he states he can't keep anything down and is heading to [facility] ED. His sister is coming to get him. He states he had lab work done at another office and they were supposed to send it here.

## 2018-01-17 NOTE — ED Notes (Signed)
Pt is unable to give a urine sample right now.

## 2018-01-17 NOTE — ED Notes (Signed)
ED TO INPATIENT HANDOFF REPORT  Name/Age/Gender Gregory [name]. 57 y.o. male  Code Status    Code Status Orders  (From admission, onward)        Start     Ordered   01/17/18 1756  Do not attempt resuscitation (DNR)  Continuous    Question Answer Comment  In the event of cardiac or respiratory ARREST Do not call a "code blue"   In the event of cardiac or respiratory ARREST Do not perform Intubation, CPR, defibrillation or ACLS   In the event of cardiac or respiratory ARREST Use medication by any route, position, wound care, and other measures to relive pain and suffering. May use oxygen, suction and manual treatment of airway obstruction as needed for comfort.      01/17/18 1757    Code Status History    Date Active Date Inactive Code Status Order ID Comments User Context   09/14/2017 2035 09/18/2017 1919 Full Code [id number]  Norval Morton, MD ED   05/18/2017 2133 05/22/2017 1811 Full Code [id number]  Bethena Roys, MD Inpatient   04/28/2017 1234 05/02/2017 1545 Full Code [id number]  Waldemar Dickens, MD Inpatient   04/11/2017 0051 04/13/2017 1558 Full Code [id number]  Filippou, Braxton Feathers, MD Inpatient    Advance Directive Documentation     Most Recent Value  Type of Advance Directive  Healthcare Power of Attorney, Living will  Pre-existing out of facility DNR order (yellow form or pink MOST form)  -  "MOST" Form in Place?  -      Home/SNF/Other Home  Chief Complaint dehydrated  Level of Care/Admitting Diagnosis ED Disposition    ED Disposition Condition [place]: [place] [[id number]]  Level of Care: Med-Surg [16]  Diagnosis: Crohn's disease of both small and lg int w [facility]) [[id number]]  Admitting Physician: [place], [place]  Attending Physician: Debbe Odea [[id number]]  Estimated length of stay: past midnight tomorrow  Certification:: I certify this patient will need inpatient services for at least 2 midnights  PT Class (Do Not Modify): Inpatient [101]  PT Acc Code (Do Not Modify): Private [1]       Medical History Past Medical History:  Diagnosis Date  . Acute endocarditis   . Crohn's disease ([place])   . Depression   . Eating disorder   . GERD (gastroesophageal reflux disease)   . Kidney stones   . Short gut syndrome     Allergies Allergies  Allergen Reactions  . Methotrexate Derivatives Other (See Comments)    Anemia, low WBC, severe GI symptoms,   . Humira [Adalimumab] Other (See Comments)    Intolerance  . Penicillins Other (See Comments)    REACTION DURING SURGERY Has patient had a PCN reaction causing immediate rash, facial/tongue/throat swelling, SOB or lightheadedness with hypotension: Unknown Has patient had a PCN reaction causing severe rash involving mucus membranes or skin necrosis: Unknown Has patient had a PCN reaction that required hospitalization: Unknown Has patient had a PCN reaction occurring within the last 10 years: No If all of the above answers are "NO", then may proceed with Cephalosporin use.   . Remicade [Infliximab] Other (See Comments)    Intolerance    IV Location/Drains/Wounds Patient Lines/Drains/Airways Status   Active Line/Drains/Airways    Name:   Placement date:   Placement time:   Site:   Days:   Peripheral IV 01/17/18 Right Antecubital   01/17/18    1426  Antecubital   less than 1   Ileostomy LLQ   04/12/17    1043    LLQ   280   Incision (Closed) 05/14/17 Back Left   05/14/17    1033     248   Wound / Incision (Open or Dehisced) 09/17/17 Laceration Arm Left;Lower;Posterior   09/17/17    1450    Arm   122          Labs/Imaging Results for orders placed or performed during the hospital encounter of 01/17/18 (from the past 48 hour(s))  Lipase, blood     Status: Abnormal   Collection Time: 01/17/18  2:25 PM  Result Value Ref Range   Lipase 60 (H) 11 - 51 U/L    Comment: Performed at [facility], [place] [address], [place], [place] [address]  Comprehensive metabolic panel     Status: Abnormal   Collection Time: 01/17/18  2:25 PM  Result Value Ref Range   Sodium 142 135 - 145 mmol/L   Potassium 3.7 3.5 - 5.1 mmol/L   Chloride 107 101 - 111 mmol/L   CO2 24 22 - 32 mmol/L   Glucose, Bld 115 (H) 65 - 99 mg/dL   BUN 13 6 - 20 mg/dL   Creatinine, Ser 1.40 (H) 0.61 - 1.24 mg/dL   Calcium 9.4 8.9 - 10.3 mg/dL   Total Protein 7.7 6.5 - 8.1 g/dL   Albumin 3.4 (L) 3.5 - 5.0 g/dL   AST 21 15 - 41 U/L   ALT 15 (L) 17 - 63 U/L   Alkaline Phosphatase 71 38 - 126 U/L   Total Bilirubin 0.9 0.3 - 1.2 mg/dL   GFR calc non Af Amer 55 (L) >60 mL/min   GFR calc Af Amer >60 >60 mL/min    Comment: (NOTE) The eGFR has been calculated using the CKD EPI equation. This calculation has not been validated in all clinical situations. eGFR's persistently <60 mL/min signify possible Chronic Kidney Disease.    Anion gap 11 5 - 15    Comment: Performed at [facility], [place] [address], [place], [place] [address]  CBC     Status: Abnormal   Collection Time: 01/17/18  2:25 PM  Result Value Ref Range   WBC 5.4 4.0 - 10.5 K/uL   RBC 4.97 4.22 - 5.81 MIL/uL   Hemoglobin 14.7 13.0 - 17.0 g/dL   HCT 44.7 39.0 - 52.0 %   MCV 89.9 78.0 - 100.0 fL   MCH 29.6 26.0 - 34.0 pg   MCHC 32.9 30.0 - 36.0 g/dL   RDW 16.6 (H) 11.5 - 15.5 %   Platelets 203 150 - 400 K/uL    Comment: Performed at [facility], [place] [address] [place], [place] [address]  Troponin I     Status: None   Collection Time: 01/17/18  2:25 PM  Result Value Ref Range   Troponin I <0.03 <0.03 ng/mL    Comment: Performed at [facility], [place] [address], [place], [place] [address]   Ct Abdomen Pelvis W Contrast  Result Date: 01/17/2018 CLINICAL DATA:  Right lower quadrant abdominal pain, nausea/vomiting/diarrhea, history of Crohn's disease EXAM: CT ABDOMEN AND PELVIS WITH CONTRAST TECHNIQUE: Multidetector CT  imaging of the abdomen and pelvis was performed using the standard protocol following bolus administration of intravenous contrast. CONTRAST:  142m ISOVUE-300 IOPAMIDOL (ISOVUE-300) INJECTION 61% COMPARISON:  09/14/2017 FINDINGS: Lower chest: Mild subpleural scarring/atelectasis in the left lower  lobe. Hepatobiliary: Liver is within normal limits. Status post cholecystectomy.  No intrahepatic ductal dilatation. Mildly prominent common duct, measuring 8 mm, unchanged and likely postsurgical. Pancreas: Within normal limits. Spleen: Within normal limits. Adrenals/Urinary Tract: Adrenal glands are within normal limits. Small bilateral renal cysts, measuring up to 10 mm in the medial left upper kidney (series 7/image 15), likely benign. Bladder is within normal limits. Stomach/Bowel: Stomach is within normal limits. 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 No evidence of bowel obstruction. Mild wall thickening involving a loop of ileum in the left mid abdomen (series 2/image 53), with pericolonic inflammatory changes. This appearance is significantly improved from the prior, although active inflammatory Crohn's disease persists. Vascular/Lymphatic: No evidence of abdominal aortic aneurysm. No suspicious abdominopelvic lymphadenopathy. Reproductive: Prostate is unremarkable. Other: No abdominopelvic ascites. Musculoskeletal: Mild degenerative changes at L5-S1. IMPRESSION: Mild inflammatory changes involving a loop of ileum in the left mid abdomen, reflecting active inflammatory Crohn's disease, significantly improved from the prior. 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 No evidence of bowel obstruction. Additional stable ancillary findings as above. Electronically Signed   By: Julian Hy M.D.   On: 01/17/2018 16:53    Pending Labs Unresulted Labs (From admission, onward)   Start     Ordered   01/24/18 0500  Creatinine, serum   (enoxaparin (LOVENOX)    CrCl >/= 30 ml/min)  Weekly,   R    Comments:  while on enoxaparin therapy    01/17/18 1757   01/18/18 6720  Basic metabolic panel  Tomorrow morning,   R     01/17/18 1757   01/18/18 0500  CBC  Tomorrow morning,   R     01/17/18 1757   01/17/18 1808  C difficile quick scan w PCR reflex  (C Difficile quick screen w PCR reflex panel)  Once, for 24 hours,   R     01/17/18 1807   01/17/18 1807  Gastrointestinal Panel by PCR , Stool  (Gastrointestinal Panel by PCR, Stool)  Once,   R     01/17/18 1807   01/17/18 1807  Stool culture (children & immunocomp patients)  STAT,   R     01/17/18 1807   01/17/18 1755  CBC  (enoxaparin (LOVENOX)    CrCl >/= 30 ml/min)  Once,   R    Comments:  Baseline for enoxaparin therapy IF NOT ALREADY DRAWN.  Notify MD if PLT < 100 K.    01/17/18 1757   01/17/18 1755  Creatinine, serum  (enoxaparin (LOVENOX)    CrCl >/= 30 ml/min)  Once,   R    Comments:  Baseline for enoxaparin therapy IF NOT ALREADY DRAWN.    01/17/18 1757      Vitals/Pain Today's Vitals   01/17/18 1730 01/17/18 1741 01/17/18 1800 01/17/18 1830  BP: 107/80 107/80 119/80 116/74  Pulse: 69 74 69 61  Resp: _0 Temp:      TempSrc:      SpO2: 99% 100% 98% 100%  PainSc:        Isolation Precautions Enteric precautions (UV disinfection)  Medications Medications  iopamidol (ISOVUE-300) 61 % injection (has no administration in time range)  enoxaparin (LOVENOX) injection 40 mg (has no administration in time range)  ondansetron (ZOFRAN) tablet 4 mg ( Oral See Alternative 01/17/18 1818)    Or  ondansetron (ZOFRAN) injection 4 mg (4 mg Intravenous Given 01/17/18 1818)  promethazine (PHENERGAN) injection  25 mg (has no administration in time range)  methylPREDNISolone sodium succinate (SOLU-MEDROL) 40 mg/mL injection 40 mg (40 mg Intravenous Given 01/17/18 1818)  ciprofloxacin (CIPRO) IVPB 400 mg (has no administration in time range)  metroNIDAZOLE (FLAGYL)  IVPB 500 mg (0 mg Intravenous Stopped 01/17/18 1849)  sertraline (ZOLOFT) tablet 50 mg (has no administration in time range)  HYDROmorphone (DILAUDID) injection 1 mg (has no administration in time range)  oxyCODONE (Oxy IR/ROXICODONE) immediate release tablet 7.5 mg (has no administration in time range)  famotidine (PEPCID) IVPB 20 mg premix (has no administration in time range)  dextrose 5 % and 0.9 % NaCl with KCl 40 mEq/L infusion (has no administration in time range)  sodium chloride 0.9 % bolus 2,000 mL (0 mLs Intravenous Stopped 01/17/18 1651)  ondansetron (ZOFRAN) injection 4 mg (4 mg Intravenous Given 01/17/18 1515)  fentaNYL (SUBLIMAZE) injection 50 mcg (50 mcg Intravenous Given 01/17/18 1515)  iopamidol (ISOVUE-300) 61 % injection 100 mL (100 mLs Intravenous Contrast Given 01/17/18 1615)  sodium chloride 0.9 % bolus 1,000 mL (0 mLs Intravenous Stopped 01/17/18 1806)  morphine 4 MG/ML injection 4 mg (4 mg Intravenous Given 01/17/18 1741)    Mobility walks

## 2018-01-17 NOTE — ED Triage Notes (Signed)
Per patient, states he has a history of Crohn's disease-states nausea and vomiting that started yesterday-thinks he is dehydrated-GI MD told him to come to ED for eval

## 2018-01-17 NOTE — H&P (Signed)
History and Physical    Gregory [name]. KVQ:[id number] DOB: Oct 21, 1960 DOA: 01/17/2018    PCP: Billie Ruddy, MD  Patient coming from: home  Chief Complaint: vomiting and abdominal pain  HPI: Gregory [name]. is a 57 y.o. male with medical history of Crohn's disease s/p subtotal colectomy with colostomy, depression  who presents for nausea, vomiting and abdominal pain. This has been present for 3- 4 days now. Vomitus is non-bloody. He is still having stool in his ostomy. Pain is in the right lower abdomen and sharp. He did try to take ODT Zofran but feels he vomited it up. He received an Entyvio injection the day before symptoms started and per his sister, the frequency of episodes has decreased since beginning the infusions. No c/o fever or chills.   ED Course: CT reveals inflammation in ileum. Has received 2 L IVF  In ED and has not urinated.   Review of Systems:  All other systems reviewed and apart from HPI, are negative.  Past Medical History:  Diagnosis Date  . Acute endocarditis   . Crohn's disease ([place])   . Depression   . Eating disorder   . GERD (gastroesophageal reflux disease)   . Kidney stones   . Short gut syndrome     Past Surgical History:  Procedure Laterality Date  . COLONOSCOPY WITH PROPOFOL N/A 09/17/2017   Procedure: COLONOSCOPY WITH PROPOFOL;  Surgeon: [name], MD;  Location: [facility];  Service: Endoscopy;  Laterality: N/A;  Through ileostomy.  [name] WITH STENT PLACEMENT Left 05/20/2017   Procedure: CYSTOSCOPY WITH STENT PLACEMENT;  Surgeon: [name], MD;  Location: [facility] ORS;  Service: Urology;  Laterality: Left;  . ILEOSTOMY    . IR NEPHROSTOMY EXCHANGE LEFT  04/12/2017  . IR NEPHROSTOMY PLACEMENT LEFT  04/12/2017  . IR NEPHROSTOMY PLACEMENT LEFT  04/29/2017  . NEPHROLITHOTOMY Left 05/14/2017   Procedure: LEFT NEPHROLITHOTOMY PERCUTANEOUS WITH SURGEON ACCESS;  Surgeon: [name], MD;  Location: [facility] ORS;   Service: Urology;  Laterality: Left;  . SMALL INTESTINE SURGERY    . SUBTOTAL COLECTOMY      Social History:   reports that he quit smoking about 11 years ago. His smoking use included cigarettes. He has a 20.00 pack-year smoking history. He has never used smokeless tobacco. He reports that he does not drink alcohol or use drugs.  Allergies  Allergen Reactions  . Methotrexate Derivatives Other (See Comments)    Anemia, low WBC, severe GI symptoms,   . Humira [Adalimumab] Other (See Comments)    Intolerance  . Penicillins Other (See Comments)    REACTION DURING SURGERY Has patient had a PCN reaction causing immediate rash, facial/tongue/throat swelling, SOB or lightheadedness with hypotension: Unknown Has patient had a PCN reaction causing severe rash involving mucus membranes or skin necrosis: Unknown Has patient had a PCN reaction that required hospitalization: Unknown Has patient had a PCN reaction occurring within the last 10 years: No If all of the above answers are "NO", then may proceed with Cephalosporin use.   . Remicade [Infliximab] Other (See Comments)    Intolerance    Family History  Problem Relation Age of Onset  . Heart disease Mother   . Diabetes Father   . Heart disease Father   . Heart attack Father   . Melanoma Sister   . Crohn's disease Brother   . Inflammatory bowel disease Brother   . Diabetes Paternal Grandmother  Prior to Admission medications   Medication Sig Start Date End Date Taking? Authorizing Provider  acetaminophen (TYLENOL) 500 MG tablet Take 500 mg by mouth every 6 (six) hours as needed for moderate pain.   Yes [provider]  diphenoxylate-atropine (LOMOTIL) 2.5-0.025 MG tablet Take 1 tablet by mouth 4 (four) times daily as needed for diarrhea or loose stools. 09/18/17  Yes Thurnell Lose, MD  escitalopram (LEXAPRO) 10 MG tablet Take 10 mg by mouth daily.   Yes [provider]  ferrous sulfate 325 (65 FE) MG tablet  TAKE 1 TABLET (325 MG TOTAL) 2 (TWO) TIMES DAILY WITH A MEAL BY MOUTH. 11/15/17  Yes Armbruster, Carlota Raspberry, MD  ipratropium (ATROVENT) 0.03 % nasal spray Place 2 sprays 2 (two) times daily into both nostrils. Can use 3 times a day as needed 07/12/17  Yes Armbruster, Carlota Raspberry, MD  Multiple Vitamin (MULTIVITAMIN WITH MINERALS) TABS tablet Take 1 tablet by mouth daily. 05/03/17  Yes Rai, Ripudeep K, MD  ondansetron (ZOFRAN-ODT) 4 MG disintegrating tablet Take 1 tablet (4 mg total) by mouth every 8 (eight) hours as needed for nausea or vomiting. 10/01/17  Yes Zehr, Laban Emperor, PA-C  potassium chloride SA (KLOR-CON M20) 20 MEQ tablet Take 1 tablet by mouth daily 11/03/17  Yes Armbruster, Carlota Raspberry, MD  ranitidine (ZANTAC) 150 MG tablet Take 1 tablet (150 mg total) by mouth 2 (two) times daily. Patient taking differently: Take 150 mg by mouth 2 (two) times daily as needed for heartburn.  03/09/17  Yes Armbruster, Carlota Raspberry, MD  sertraline (ZOLOFT) 50 MG tablet Take 1 tablet (50 mg total) by mouth daily. 08/26/17  Yes Billie Ruddy, MD  traMADol (ULTRAM) 50 MG tablet Take 1 tablet (50 mg total) by mouth every 8 (eight) hours as needed. Patient taking differently: Take 50 mg by mouth every 8 (eight) hours as needed for moderate pain.  01/10/18  Yes Armbruster, Carlota Raspberry, MD  Vitamin D, Ergocalciferol, 2000 units CAPS Take 1 capsule by mouth daily.   Yes [provider]    Physical Exam: Wt Readings from Last 3 Encounters:  01/10/18 55.3 kg (122 lb)  10/20/17 55.3 kg (122 lb)  10/01/17 53.1 kg (117 lb 2 oz)   Vitals:   01/17/18 1600 01/17/18 1630 01/17/18 1700 01/17/18 1741  BP: 102/79 115/75 105/81 107/80  Pulse: 72 67 76 74  Resp: 10 11 11 16   Temp:      TempSrc:      SpO2: 99% 100% 100% 100%      Constitutional: NAD, calm, comfortable- cachectic  Eyes: PERTLA, lids and conjunctivae normal ENMT: Mucous membranes are moist. Posterior pharynx clear of any exudate or lesions. Normal dentition.    Neck: normal, supple, no masses, no thyromegaly Respiratory: clear to auscultation bilaterally, no wheezing, no crackles. Normal respiratory effort. No accessory muscle use.  Cardiovascular: S1 & S2 heard, regular rate and rhythm, no murmurs / rubs / gallops. No extremity edema. 2+ pedal pulses. No carotid bruits.  Abdomen: No distension,  Tenderness in right lower quadrant, ostomy bag has gas and liquid stool,   Bowel sounds normal.  Musculoskeletal: no clubbing / cyanosis. No joint deformity upper and lower extremities. Good ROM, no contractures. Normal muscle tone.  Skin: no rashes, lesions, ulcers. No induration Neurologic: CN 2-12 grossly intact. Sensation intact, DTR normal. Strength 5/5 in all 4 limbs.  Psychiatric: Normal judgment and insight. Alert and oriented x 3. Normal mood.     Labs  on Admission: I have personally reviewed following labs and imaging studies  CBC: Recent Labs  Lab 01/17/18 1425  WBC 5.4  HGB 14.7  HCT 44.7  MCV 89.9  PLT 885   Basic Metabolic Panel: Recent Labs  Lab 01/17/18 1425  NA 142  K 3.7  CL 107  CO2 24  GLUCOSE 115*  BUN 13  CREATININE 1.40*  CALCIUM 9.4   GFR: Estimated Creatinine Clearance: 46.1 mL/min (A) (by C-G formula based on SCr of 1.4 mg/dL (H)). Liver Function Tests: Recent Labs  Lab 01/17/18 1425  AST 21  ALT 15*  ALKPHOS 71  BILITOT 0.9  PROT 7.7  ALBUMIN 3.4*   Recent Labs  Lab 01/17/18 1425  LIPASE 60*   No results for input(s): AMMONIA in the last 168 hours. Coagulation Profile: No results for input(s): INR, PROTIME in the last 168 hours. Cardiac Enzymes: Recent Labs  Lab 01/17/18 1425  TROPONINI <0.03   BNP (last 3 results) No results for input(s): PROBNP in the last 8760 hours. HbA1C: No results for input(s): HGBA1C in the last 72 hours. CBG: No results for input(s): GLUCAP in the last 168 hours. Lipid Profile: No results for input(s): CHOL, HDL, LDLCALC, TRIG, CHOLHDL, LDLDIRECT in the last  72 hours. Thyroid Function Tests: No results for input(s): TSH, T4TOTAL, FREET4, T3FREE, THYROIDAB in the last 72 hours. Anemia Panel: No results for input(s): VITAMINB12, FOLATE, FERRITIN, TIBC, IRON, RETICCTPCT in the last 72 hours. Urine analysis:    Component Value Date/Time   COLORURINE YELLOW 10/20/2017 [place] 10/20/2017 1418   LABSPEC 1.020 10/20/2017 1418   PHURINE 6.0 10/20/2017 1418   GLUCOSEU NEGATIVE 10/20/2017 1418   HGBUR NEGATIVE 10/20/2017 1418   BILIRUBINUR NEGATIVE 10/20/2017 1418   KETONESUR NEGATIVE 10/20/2017 1418   PROTEINUR NEGATIVE 09/14/2017 1855   UROBILINOGEN 0.2 10/20/2017 1418   NITRITE NEGATIVE 10/20/2017 1418   LEUKOCYTESUR NEGATIVE 10/20/2017 1418   Sepsis Labs: @LABRCNTIP (procalcitonin:4,lacticidven:4) )No results found for this or any previous visit (from the past 240 hour(s)).   Radiological Exams on Admission: Ct Abdomen Pelvis W Contrast  Result Date: 01/17/2018 CLINICAL DATA:  Right lower quadrant abdominal pain, nausea/vomiting/diarrhea, history of Crohn's disease EXAM: CT ABDOMEN AND PELVIS WITH CONTRAST TECHNIQUE: Multidetector CT imaging of the abdomen and pelvis was performed using the standard protocol following bolus administration of intravenous contrast. CONTRAST:  158m ISOVUE-300 IOPAMIDOL (ISOVUE-300) INJECTION 61% COMPARISON:  09/14/2017 FINDINGS: Lower chest: Mild subpleural scarring/atelectasis in the left lower lobe. Hepatobiliary: Liver is within normal limits. Status post cholecystectomy.  No intrahepatic ductal dilatation. Mildly prominent common duct, measuring 8 mm, unchanged and likely postsurgical. Pancreas: Within normal limits. Spleen: Within normal limits. Adrenals/Urinary Tract: Adrenal glands are within normal limits. Small bilateral renal cysts, measuring up to 10 mm in the medial left upper kidney (series 7/image 15), likely benign. Bladder is within normal limits. Stomach/Bowel: Stomach is within  normal limits. 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 No evidence of bowel obstruction. Mild wall thickening involving a loop of ileum in the left mid abdomen (series 2/image 53), with pericolonic inflammatory changes. This appearance is significantly improved from the prior, although active inflammatory Crohn's disease persists. Vascular/Lymphatic: No evidence of abdominal aortic aneurysm. No suspicious abdominopelvic lymphadenopathy. Reproductive: Prostate is unremarkable. Other: No abdominopelvic ascites. Musculoskeletal: Mild degenerative changes at L5-S1. IMPRESSION: Mild inflammatory changes involving a loop of ileum in the left mid abdomen, reflecting active inflammatory Crohn's disease, significantly improved from  the prior. 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 No evidence of bowel obstruction. Additional stable ancillary findings as above. Electronically Signed   By: Julian Hy M.D.   On: 01/17/2018 16:53       Assessment/Plan Principal Problem:   Crohn's disease of both small and lg int w unsp comp Vomiting/ RLQ pain  AKI Dehydration - is on his 3rd liter of NS and has not urinated- cont IVF  at 125 cc/hr which is more than maintenance for his body weigth - start Solumedrol, Cipro and Flagyl - Zofran IV QID and Phenergan PRN - Pepcid BID IV - clear liquids for now - dilaudid and oxycodone PRN for pain - follow I and O carefully - ED will consult [facility]- he follows with Dr Havery Moros    Active Problems:   Depression - cont Zoloft  Malnourished/underweight Weighs 122 lbs   DVT prophylaxis: Lovenox  Code Status: DNR  Family Communication: sister  Disposition Plan: med/ surg  Consults called: [facility] by ED  Admission status: inpatient    Debbe Odea MD [facility] Pager: [email or web address] Password [email or web address] 7PM-7AM, please contact night-coverage   01/17/2018, 6:11 PM

## 2018-01-18 ENCOUNTER — Other Ambulatory Visit: Payer: Self-pay

## 2018-01-18 DIAGNOSIS — D72819 Decreased white blood cell count, unspecified: Secondary | ICD-10-CM

## 2018-01-18 DIAGNOSIS — R112 Nausea with vomiting, unspecified: Secondary | ICD-10-CM

## 2018-01-18 DIAGNOSIS — R197 Diarrhea, unspecified: Secondary | ICD-10-CM

## 2018-01-18 DIAGNOSIS — K50819 Crohn's disease of both small and large intestine with unspecified complications: Secondary | ICD-10-CM

## 2018-01-18 LAB — GASTROINTESTINAL PANEL BY PCR, STOOL (REPLACES STOOL CULTURE)

## 2018-01-18 LAB — CBC
HCT: 36.8 % — ABNORMAL LOW (ref 39.0–52.0)
HEMOGLOBIN: 11.9 g/dL — AB (ref 13.0–17.0)
MCH: 29.4 pg (ref 26.0–34.0)
MCHC: 32.3 g/dL (ref 30.0–36.0)
MCV: 90.9 fL (ref 78.0–100.0)
PLATELETS: 152 10*3/uL (ref 150–400)
RBC: 4.05 MIL/uL — AB (ref 4.22–5.81)
RDW: 16.5 % — ABNORMAL HIGH (ref 11.5–15.5)
WBC: 2.1 10*3/uL — AB (ref 4.0–10.5)

## 2018-01-18 LAB — BASIC METABOLIC PANEL
ANION GAP: 7 (ref 5–15)
BUN: 10 mg/dL (ref 6–20)
CHLORIDE: 111 mmol/L (ref 101–111)
CO2: 21 mmol/L — AB (ref 22–32)
CREATININE: 1.13 mg/dL (ref 0.61–1.24)
Calcium: 8.2 mg/dL — ABNORMAL LOW (ref 8.9–10.3)
GFR calc non Af Amer: >60 mL/min (ref >60)
Glucose, Bld: 154 mg/dL — ABNORMAL HIGH (ref 65–99)
POTASSIUM: 4 mmol/L (ref 3.5–5.1)
SODIUM: 139 mmol/L (ref 135–145)

## 2018-01-18 MED ORDER — BOOST / RESOURCE BREEZE PO LIQD CUSTOM: 1.0000 | Freq: Three times a day (TID) | ORAL | Status: DC

## 2018-01-18 MED ORDER — LORAZEPAM 2 MG/ML IJ SOLN
0.5000 mg | Freq: Once | INTRAMUSCULAR | Status: AC
  Administered 2018-01-18: 0.5 mg via INTRAVENOUS
  Filled 2018-01-18: qty 1

## 2018-01-18 MED ORDER — METHYLPREDNISOLONE SODIUM SUCC 40 MG IJ SOLR
40.0000 mg | Freq: Two times a day (BID) | INTRAMUSCULAR | Status: DC
  Administered 2018-01-19 – 2018-01-20 (×3): 40 mg via INTRAVENOUS
  Filled 2018-01-18 (×3): qty 1

## 2018-01-18 MED ORDER — ADULT MULTIVITAMIN LIQUID CH
15.0000 mL | Freq: Every day | ORAL | Status: DC
  Administered 2018-01-18 – 2018-01-20 (×3): 15 mL via ORAL
  Filled 2018-01-18 (×3): qty 15

## 2018-01-18 MED ORDER — PRO-STAT SUGAR FREE PO LIQD
30.0000 mL | Freq: Every day | ORAL | Status: DC
  Administered 2018-01-18 – 2018-01-20 (×3): 30 mL via ORAL
  Filled 2018-01-18 (×3): qty 30

## 2018-01-18 NOTE — Consult Note (Addendum)
Referring Provider:  Dr. Wynelle Cleveland, [facility] Primary Care Physician:  Billie Ruddy, MD Primary Gastroenterologist:  Dr. Havery Moros  Reason for Consultation:  N/V/D/AP  HPI: Gregory [name]. is a 57 y.o. 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 Havery Moros.  "Diagnosed with Crohn's disease in 1980s.Small bowel disease, with multiple small bowel resections/ abscesses, reported short gut syndrome. He has a colostomy with a remnant rectal pouch. He reports he has had a bowel surgery in the 1982, with a "few more surgeries" in years after while in [place], as well as in [facility], they tried reversing his colostomy which did not workwell for him, too much diarrhea and it was then reversed back. He has a history of abscess associated with small bowel.   He has been hospitalized multiple times in recent years, He had been on chronic prednisone for 10 years with fluctuating levels. HewasCimzia for about 4-5 years in the past. He has been on sulfasalazine for a long time. He reports an allergy to 6MP - he does not know the details ofthe reaction. He had an allergic reaction - hives, to both Remicade and Humira.Allergy to methotrexate, now on Entyvio.  SINCE LAST VISIT  He had a flare of his Crohn's in January.  He had a CT scan at that time showing some thickened ileum concerning for active Crohn's disease. He had a colonoscopy per Dr. Silverio Decamp while hospitalized showing a normal colon but with active inflammation at the ileocolonic anastomosis.Rutgeerts Score i4. He was given a course of prednisone with subsequent taper. He had Entyvio levels obtained - he had a level of 5.4 without any antibodies drawn on 10/13/2017. Entyvio dosing increased to once every 4 weeks since his last visit. He has completed prednisone taper. Increased vitamin D to 4000 IU / day. He tested negative for C diff.   Overall he  had been doing really well since his last visit with [place]. He thinks increased dosing frequency of Entyvio has helped. He has not had any abdominal pain. His stool frequency is stable, roughly 4 BM's per day without blood. He thinks he is slowly gaining weight. Eating well. More energy, now able to volunteer his time more, he is thinking of going back to school. He does have some occasional joint pains in his hands, knees, and lower back. Had some used ultram with benefit. Not using NSAIDs.   He has not had Shingles vaccine. He thinks prior remote pneumovacc but it has been several years.   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 Doyne Keel office visit note with the patient from last week, 01/10/2018.  He presented to the ED on 5/20 with complaints of abdominal pain, nausea, vomiting, and diarrhea.  Says that he got the pneumovax on the day of his office visit and then the next day got his Entyvio.  He has been working/volunteering at [employer] a lot and wonders if he "picked something up there".  CT scan abdomen and pelvis with contrast this admission showed the following:  IMPRESSION: Mild inflammatory changes involving a loop of ileum in the left mid abdomen, reflecting active inflammatory Crohn's disease, significantly improved from the prior.  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 No evidence of  bowel obstruction.  Additional stable ancillary findings as above.  He has been started on IV cipro and flagyl along with solumedrol 40 mg every 6 hours.    Is on a full liquid diet.   Past Medical History:  Diagnosis Date  . Acute endocarditis   . Crohn's disease ([place])   . Depression   . Eating disorder   . GERD (gastroesophageal reflux disease)    . Kidney stones   . Short gut syndrome     Past Surgical History:  Procedure Laterality Date  . COLONOSCOPY WITH PROPOFOL N/A 09/17/2017   Procedure: COLONOSCOPY WITH PROPOFOL;  Surgeon: [name], MD;  Location: [facility];  Service: Endoscopy;  Laterality: N/A;  Through ileostomy.  [name] WITH STENT PLACEMENT Left 05/20/2017   Procedure: CYSTOSCOPY WITH STENT PLACEMENT;  Surgeon: [name], MD;  Location: [facility];  Service: Urology;  Laterality: Left;  . ILEOSTOMY    . IR NEPHROSTOMY EXCHANGE LEFT  04/12/2017  . IR NEPHROSTOMY PLACEMENT LEFT  04/12/2017  . IR NEPHROSTOMY PLACEMENT LEFT  04/29/2017  . NEPHROLITHOTOMY Left 05/14/2017   Procedure: LEFT NEPHROLITHOTOMY PERCUTANEOUS WITH SURGEON ACCESS;  Surgeon: [name], MD;  Location: [facility];  Service: Urology;  Laterality: Left;  . SMALL INTESTINE SURGERY    . SUBTOTAL COLECTOMY      Prior to Admission medications   Medication Sig Start Date End Date Taking? Authorizing Provider  acetaminophen (TYLENOL) 500 MG tablet Take 500 mg by mouth every 6 (six) hours as needed for moderate pain.   Yes [provider]  diphenoxylate-atropine (LOMOTIL) 2.5-0.025 MG tablet Take 1 tablet by mouth 4 (four) times daily as needed for diarrhea or loose stools. 09/18/17  Yes Thurnell Lose, MD  ferrous sulfate 325 (65 FE) MG tablet TAKE 1 TABLET (325 MG TOTAL) 2 (TWO) TIMES DAILY WITH A MEAL BY MOUTH. 11/15/17  Yes Armbruster, Carlota Raspberry, MD  ipratropium (ATROVENT) 0.03 % nasal spray Place 2 sprays 2 (two) times daily into both nostrils. Can use 3 times a day as needed 07/12/17  Yes Armbruster, Carlota Raspberry, MD  Multiple Vitamin (MULTIVITAMIN WITH MINERALS) TABS tablet Take 1 tablet by mouth daily. 05/03/17  Yes Rai, Ripudeep K, MD  ondansetron (ZOFRAN-ODT) 4 MG disintegrating tablet Take 1 tablet (4 mg total) by mouth every 8 (eight) hours as needed for nausea or vomiting. 10/01/17  Yes Zehr, Laban Emperor, PA-C  potassium  chloride SA (KLOR-CON M20) 20 MEQ tablet Take 1 tablet by mouth daily 11/03/17  Yes Armbruster, Carlota Raspberry, MD  ranitidine (ZANTAC) 150 MG tablet Take 1 tablet (150 mg total) by mouth 2 (two) times daily. Patient taking differently: Take 150 mg by mouth 2 (two) times daily as needed for heartburn.  03/09/17  Yes Armbruster, Carlota Raspberry, MD  sertraline (ZOLOFT) 50 MG tablet Take 1 tablet (50 mg total) by mouth daily. 08/26/17  Yes Billie Ruddy, MD  traMADol (ULTRAM) 50 MG tablet Take 1 tablet (50 mg total) by mouth every 8 (eight) hours as needed. Patient taking differently: Take 50 mg by mouth every 8 (eight) hours as needed for moderate pain.  01/10/18  Yes Armbruster, Carlota Raspberry, MD  Vitamin D, Ergocalciferol, 2000 units CAPS Take 1 capsule by mouth daily.   Yes [provider]    Current Facility-Administered Medications  Medication Dose Route Frequency Provider Last Rate Last Dose  . ciprofloxacin (CIPRO) IVPB 400 mg  400 mg Intravenous Q12H Debbe Odea, MD  Stopped at 01/18/18 0740  . dextrose 5 % and 0.9 % NaCl with KCl 40 mEq/L infusion   Intravenous Continuous Debbe Odea, MD 125 mL/hr at 01/18/18 0647    . enoxaparin (LOVENOX) injection 40 mg  40 mg Subcutaneous Q24H Debbe Odea, MD   40 mg at 01/17/18 2259  . famotidine (PEPCID) IVPB 20 mg premix  20 mg Intravenous Q12H Debbe Odea, MD   Stopped at 01/18/18 1020  . feeding supplement (BOOST / RESOURCE BREEZE) liquid 1 Container  1 Container Oral TID BM Rizwan, Saima, MD      . feeding supplement (PRO-STAT SUGAR FREE 64) liquid 30 mL  30 mL Oral Daily Debbe Odea, MD   30 mL at 01/18/18 1358  . HYDROmorphone (DILAUDID) injection 1 mg  1 mg Intravenous Q4H PRN Debbe Odea, MD   1 mg at 01/18/18 1359  . methylPREDNISolone sodium succinate (SOLU-MEDROL) 40 mg/mL injection 40 mg  40 mg Intravenous Q6H Rizwan, Saima, MD   40 mg at 01/18/18 1347  . metroNIDAZOLE (FLAGYL) IVPB 500 mg  500 mg Intravenous Q8H Debbe Odea, MD    Stopped at 01/18/18 1215  . multivitamin liquid 15 mL  15 mL Oral Daily Debbe Odea, MD   15 mL at 01/18/18 1349  . ondansetron (ZOFRAN) injection 4 mg  4 mg Intravenous Q6H Rizwan, Saima, MD   4 mg at 01/18/18 1348   Or  . ondansetron (ZOFRAN) tablet 4 mg  4 mg Oral Q6H Rizwan, Saima, MD      . oxyCODONE (Oxy IR/ROXICODONE) immediate release tablet 7.5 mg  7.5 mg Oral Q4H PRN Debbe Odea, MD   7.5 mg at 01/18/18 0639  . promethazine (PHENERGAN) injection 25 mg  25 mg Intravenous Q6H PRN Rizwan, Eunice Blase, MD      . sertraline (ZOLOFT) tablet 50 mg  50 mg Oral Daily Debbe Odea, MD   50 mg at 01/18/18 0949    Allergies as of 01/17/2018 - Review Complete 01/17/2018  Allergen Reaction Noted  . Methotrexate derivatives Other (See Comments) 04/22/2017  . Humira [adalimumab] Other (See Comments) 02/23/2017  . Penicillins Other (See Comments) 02/23/2017  . Remicade [infliximab] Other (See Comments) 02/23/2017    Family History  Problem Relation Age of Onset  . Heart disease Mother   . Diabetes Father   . Heart disease Father   . Heart attack Father   . Melanoma Sister   . Crohn's disease Brother   . Inflammatory bowel disease Brother   . Diabetes Paternal Grandmother     Social History   Socioeconomic History  . Marital status: Divorced    Spouse name: Not on file  . Number of children: 2  . Years of education: Not on file  . Highest education level: Not on file  Occupational History  . Occupation: Retired  Scientific laboratory technician  . Financial resource strain: Not on file  . Food insecurity:    Worry: Not on file    Inability: Not on file  . Transportation needs:    Medical: Not on file    Non-medical: Not on file  Tobacco Use  . Smoking status: Former Smoker    Packs/day: 1.00    Years: 20.00    Pack years: 20.00    Types: Cigarettes    Last attempt to quit: 08/31/2006    Years since quitting: 11.3  . Smokeless tobacco: Never Used  Substance and Sexual Activity  . Alcohol  use: No  . Drug use: No  .  Sexual activity: Not on file  Lifestyle  . Physical activity:    Days per week: Not on file    Minutes per session: Not on file  . Stress: Not on file  Relationships  . Social connections:    Talks on phone: Not on file    Gets together: Not on file    Attends religious service: Not on file    Active member of club or organization: Not on file    Attends meetings of clubs or organizations: Not on file    Relationship status: Not on file  . Intimate partner violence:    Fear of current or ex partner: Not on file    Emotionally abused: Not on file    Physically abused: Not on file    Forced sexual activity: Not on file  Other Topics Concern  . Not on file  Social History Narrative  . Not on file    Review of Systems: ROS is O/W negative except as mentioned in HPI.  Physical Exam: Vital signs in last 24 hours: Temp:  [97.8 F (36.6 C)-98.4 F (36.9 C)] 97.8 F (36.6 C) (05/21 0537) Pulse Rate:  [51-84] 51 (05/21 0537) Resp:  [10-17] 16 (05/21 0537) BP: (98-120)/(71-87) 108/71 (05/21 0537) SpO2:  [95 %-100 %] 99 % (05/21 0537) Weight:  [128 lb (58.1 kg)] 128 lb (58.1 kg) (05/21 0900) Last BM Date: 01/17/18 General:   Alert,  Well-developed, well-nourished, pleasant and cooperative in NAD Head:  Normocephalic and atraumatic. Eyes:  Sclera clear, no icterus.   Conjunctiva pink. Ears:  Normal auditory acuity. Nose:  No deformity, discharge,  or lesions. Mouth:  No deformity or lesions.   Neck:  Supple; no masses or thyromegaly. Lungs:  Clear throughout to auscultation.   No wheezes, crackles, or rhonchi. Heart:  Regular rate and rhythm; no murmurs, clicks, rubs,  or gallops. Abdomen:  Soft,nontender, BS active,nonpalp mass or hsm.   Rectal:  Deferred  Msk:  Symmetrical without gross deformities. . Pulses:  Normal pulses noted. Extremities:  Without clubbing or edema. Neurologic:  Alert and  oriented x4;  grossly normal neurologically. Skin:   Intact without significant lesions or rashes.. Psych:  Alert and cooperative. Normal mood and affect.  Intake/Output from previous day: 05/20 0701 - 05/21 0700 In: 5327.4 [P.O.:680; I.V.:947.4; IV Piggyback:3700] Out: 925 [Urine:325; Stool:600] Intake/Output this shift: Total I/O In: 1430 [P.O.:1280; IV Piggyback:150] Out: 850 [Urine:225; Stool:625]  Lab Results: Recent Labs    01/17/18 1425 01/18/18 0537  WBC 5.4 2.1*  HGB 14.7 11.9*  HCT 44.7 36.8*  PLT 203 152   BMET Recent Labs    01/17/18 1425 01/18/18 0537  NA 142 139  K 3.7 4.0  CL 107 111  CO2 24 21*  GLUCOSE 115* 154*  BUN 13 10  CREATININE 1.40* 1.13  CALCIUM 9.4 8.2*   LFT Recent Labs    01/17/18 1425  PROT 7.7  ALBUMIN 3.4*  AST 21  ALT 15*  ALKPHOS 71  BILITOT 0.9   Studies/Results: Ct Abdomen Pelvis W Contrast  Result Date: 01/17/2018 CLINICAL DATA:  Right lower quadrant abdominal pain, nausea/vomiting/diarrhea, history of Crohn's disease EXAM: CT ABDOMEN AND PELVIS WITH CONTRAST TECHNIQUE: Multidetector CT imaging of the abdomen and pelvis was performed using the standard protocol following bolus administration of intravenous contrast. CONTRAST:  137m ISOVUE-300 IOPAMIDOL (ISOVUE-300) INJECTION 61% COMPARISON:  09/14/2017 FINDINGS: Lower chest: Mild subpleural scarring/atelectasis in the left lower lobe. Hepatobiliary: Liver is within normal limits. Status post  cholecystectomy.  No intrahepatic ductal dilatation. Mildly prominent common duct, measuring 8 mm, unchanged and likely postsurgical. Pancreas: Within normal limits. Spleen: Within normal limits. Adrenals/Urinary Tract: Adrenal glands are within normal limits. Small bilateral renal cysts, measuring up to 10 mm in the medial left upper kidney (series 7/image 15), likely benign. Bladder is within normal limits. Stomach/Bowel: Stomach is within normal limits. 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 No  evidence of bowel obstruction. Mild wall thickening involving a loop of ileum in the left mid abdomen (series 2/image 53), with pericolonic inflammatory changes. This appearance is significantly improved from the prior, although active inflammatory Crohn's disease persists. Vascular/Lymphatic: No evidence of abdominal aortic aneurysm. No suspicious abdominopelvic lymphadenopathy. Reproductive: Prostate is unremarkable. Other: No abdominopelvic ascites. Musculoskeletal: Mild degenerative changes at L5-S1. IMPRESSION: Mild inflammatory changes involving a loop of ileum in the left mid abdomen, reflecting active inflammatory Crohn's disease, significantly improved from the prior. 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 No evidence of bowel obstruction. Additional stable ancillary findings as above. Electronically Signed   By: Julian Hy M.D.   On: 01/17/2018 16:53   IMPRESSION:  *57 year old male with severe penetrating/stricturing Crohn's disease with history of multiple small bowel resections and associated short gut syndrome.See history as above - intolerant to multipleanti-TNFsand 6-MP/methotrexate.On Entyvio monotherapy which has helped him since initially started last year,butadmitted for flare of disease in January. Found to have subtherapeutic Entyvio levels at that time, now on increased frequency of dosing evert 4 weeks.  Was feeling good last week.  Now with N/V/D (increased ostomy output) and abdominal pain.  CT scan shows mild active Crohn's at previously affected site, but improved from 08/2017.  ? If this acute episode was from his Crohn's vs infectious source.  Stool studies negative.  PLAN: *Agree with IV steroids for now but will back down to 40 mg every 12 hours. *Continue IV cipro and flagyl for now. *Will check a CRP and fecal calprotectin. *Monitor on full liquid diet.   Laban Emperor. Zehr  01/18/2018, 2:15 PM    Attending physician's note   I  have taken an interval history, reviewed the chart and examined the patient. I agree with the Advanced Practitioner's note, impression and recommendations.   57 year old with severe penetrating/stricturing Crohn's disease with history of multiple SB resections, short gut syndrome, intolerance to multiple treatments including several anti-TNFs, 6MP and sulfasalazine has been doing well on [employer] (has been able to wean off steroids), admitted with exacerbation of Crohn's disease. No abscess. Last colon 08/2017- neo-TI active crohn's without stricture. Plan: IV steroids (can cut it back to 40 mg every 12 hours), IV Cipro and Flagyl, full liquid diet, monitor CRP and fecal calprotectin.  CT scan reviewed.  Continue Entyvio every 4 weeks.  Discussed with the patient.  Carmell Austria, MD

## 2018-01-18 NOTE — Progress Notes (Signed)
PROGRESS NOTE    Gregory [name].   ELF:[id number]  DOB: 08-May-1961  DOA: 01/17/2018 PCP: Billie Ruddy, MD   Brief Narrative:  Gregory [name].   is a 57 y.o. male with medical history of Crohn's disease s/p subtotal colectomy with colostomy, depression  who presents for nausea, vomiting and abdominal pain. This has been present for 3- 4 days now. Vomitus is non-bloody. He is still having stool in his ostomy which is liquid. Pain is in the right lower abdomen and sharp. He did try to take ODT Zofran but feels he vomited it up. He received an Entyvio injection the day before symptoms started and per his sister, the frequency of episodes has decreased since beginning the infusions  Subjective: Still having some crampling RLQ pain and some liquid stool. No nausea or vomiting. Wants diet advanced.    Assessment & Plan:   Crohn's disease of both small and lg int w unsp comp Vomiting/ RLQ pain  - started Solumedrol, Cipro and Flagyl - cont Zofran IV QID and Phenergan PRN - Pepcid BID IV - dilaudid and oxycodone PRN for pain - follow I and O carefully - ED consulted [facility]- he follows with Dr Havery Moros  - consult still pending - GI ordered stool for c diff which is negative and pathogen panel and stool culture which are pending  AKI Dehydration - improved- cont IVF (D5NS with K at 40 meq)  Leukopenia/ anemia - recheck labs tomorrow    Depression - cont Zoloft  Malnourished/underweight Weighs 122 lbs - dietician has started supplements    DVT prophylaxis: Lovenox Code Status: DNR Family Communication: sister Disposition Plan: home in 1-2 days Consultants:   [facility] Procedures:   none Antimicrobials:  Anti-infectives (From admission, onward)   Start     Dose/Rate Route Frequency Ordered Stop   01/17/18 1800  ciprofloxacin (CIPRO) IVPB 400 mg     400 mg 200 mL/hr over 60 Minutes Intravenous Every 12 hours 01/17/18 1758     01/17/18 1800   metroNIDAZOLE (FLAGYL) IVPB 500 mg     500 mg 100 mL/hr over 60 Minutes Intravenous Every 8 hours 01/17/18 1758         Objective: Vitals:   01/17/18 1830 01/17/18 2001 01/18/18 0537 01/18/18 0900  BP: 116/74 120/77 108/71   Pulse: 61 61 (!) 51   Resp: 11 16 16    Temp:  98.4 F (36.9 C) 97.8 F (36.6 C)   TempSrc:  Oral Oral   SpO2: 100% 100% 99%   Weight:    58.1 kg (128 lb)  Height:    5' 11"  (1.803 m)    Intake/Output Summary (Last 24 hours) at 01/18/2018 1353 Last data filed at 01/18/2018 1320 Gross per 24 hour  Intake 6607.42 ml  Output 925 ml  Net 5682.42 ml   Filed Weights   01/18/18 0900  Weight: 58.1 kg (128 lb)    Examination: General exam: Appears comfortable  HEENT: PERRLA, oral mucosa moist, no sclera icterus or thrush Respiratory system: Clear to auscultation. Respiratory effort normal. Cardiovascular system: S1 & S2 heard, RRR.   Gastrointestinal system: Abdomen soft,  -tender in RLQ still, nondistended. Normal bowel sound. No organomegaly Central nervous system: Alert and oriented. No focal neurological deficits. Extremities: No cyanosis, clubbing or edema Skin: No rashes or ulcers Psychiatry:  Mood & affect appropriate.     Data Reviewed: I have personally reviewed following labs and imaging studies  CBC: Recent Labs  Lab 01/17/18 1425 01/18/18 0537  WBC 5.4 2.1*  HGB 14.7 11.9*  HCT 44.7 36.8*  MCV 89.9 90.9  PLT 203 962   Basic Metabolic Panel: Recent Labs  Lab 01/17/18 1425 01/18/18 0537  NA 142 139  K 3.7 4.0  CL 107 111  CO2 24 21*  GLUCOSE 115* 154*  BUN 13 10  CREATININE 1.40* 1.13  CALCIUM 9.4 8.2*   GFR: Estimated Creatinine Clearance: 60 mL/min (by C-G formula based on SCr of 1.13 mg/dL). Liver Function Tests: Recent Labs  Lab 01/17/18 1425  AST 21  ALT 15*  ALKPHOS 71  BILITOT 0.9  PROT 7.7  ALBUMIN 3.4*   Recent Labs  Lab 01/17/18 1425  LIPASE 60*   No results for input(s): AMMONIA in the last 168  hours. Coagulation Profile: No results for input(s): INR, PROTIME in the last 168 hours. Cardiac Enzymes: Recent Labs  Lab 01/17/18 1425  TROPONINI <0.03   BNP (last 3 results) No results for input(s): PROBNP in the last 8760 hours. HbA1C: No results for input(s): HGBA1C in the last 72 hours. CBG: No results for input(s): GLUCAP in the last 168 hours. Lipid Profile: No results for input(s): CHOL, HDL, LDLCALC, TRIG, CHOLHDL, LDLDIRECT in the last 72 hours. Thyroid Function Tests: No results for input(s): TSH, T4TOTAL, FREET4, T3FREE, THYROIDAB in the last 72 hours. Anemia Panel: No results for input(s): VITAMINB12, FOLATE, FERRITIN, TIBC, IRON, RETICCTPCT in the last 72 hours. Urine analysis:    Component Value Date/Time   COLORURINE YELLOW 10/20/2017 [place] 10/20/2017 1418   LABSPEC 1.020 10/20/2017 1418   PHURINE 6.0 10/20/2017 1418   GLUCOSEU NEGATIVE 10/20/2017 1418   HGBUR NEGATIVE 10/20/2017 1418   BILIRUBINUR NEGATIVE 10/20/2017 1418   KETONESUR NEGATIVE 10/20/2017 1418   PROTEINUR NEGATIVE 09/14/2017 1855   UROBILINOGEN 0.2 10/20/2017 1418   NITRITE NEGATIVE 10/20/2017 1418   LEUKOCYTESUR NEGATIVE 10/20/2017 1418   Sepsis Labs: @LABRCNTIP (procalcitonin:4,lacticidven:4) ) Recent Results (from the past 240 hour(s))  C difficile quick scan w PCR reflex     Status: None   Collection Time: 01/17/18  6:07 PM  Result Value Ref Range Status   C Diff antigen NEGATIVE NEGATIVE Final   C Diff toxin NEGATIVE NEGATIVE Final   C Diff interpretation No C. difficile detected.  Final    Comment: Performed at [facility], [place] [address], [place], [place] [address]         Radiology Studies: Ct Abdomen Pelvis W Contrast  Result Date: 01/17/2018 CLINICAL DATA:  Right lower quadrant abdominal pain, nausea/vomiting/diarrhea, history of Crohn's disease EXAM: CT ABDOMEN AND PELVIS WITH CONTRAST TECHNIQUE: Multidetector CT imaging of the  abdomen and pelvis was performed using the standard protocol following bolus administration of intravenous contrast. CONTRAST:  155m ISOVUE-300 IOPAMIDOL (ISOVUE-300) INJECTION 61% COMPARISON:  09/14/2017 FINDINGS: Lower chest: Mild subpleural scarring/atelectasis in the left lower lobe. Hepatobiliary: Liver is within normal limits. Status post cholecystectomy.  No intrahepatic ductal dilatation. Mildly prominent common duct, measuring 8 mm, unchanged and likely postsurgical. Pancreas: Within normal limits. Spleen: Within normal limits. Adrenals/Urinary Tract: Adrenal glands are within normal limits. Small bilateral renal cysts, measuring up to 10 mm in the medial left upper kidney (series 7/image 15), likely benign. Bladder is within normal limits. Stomach/Bowel: Stomach is within normal limits. 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 No evidence of bowel obstruction. Mild wall thickening involving a loop of ileum in the left mid  abdomen (series 2/image 53), with pericolonic inflammatory changes. This appearance is significantly improved from the prior, although active inflammatory Crohn's disease persists. Vascular/Lymphatic: No evidence of abdominal aortic aneurysm. No suspicious abdominopelvic lymphadenopathy. Reproductive: Prostate is unremarkable. Other: No abdominopelvic ascites. Musculoskeletal: Mild degenerative changes at L5-S1. IMPRESSION: Mild inflammatory changes involving a loop of ileum in the left mid abdomen, reflecting active inflammatory Crohn's disease, significantly improved from the prior. 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 No evidence of bowel obstruction. Additional stable ancillary findings as above. Electronically Signed   By: Julian Hy M.D.   On: 01/17/2018 16:53      Scheduled Meds: . enoxaparin (LOVENOX) injection  40 mg Subcutaneous Q24H  . feeding supplement  1 Container Oral TID BM  .  feeding supplement (PRO-STAT SUGAR FREE 64)  30 mL Oral Daily  . methylPREDNISolone (SOLU-MEDROL) injection  40 mg Intravenous Q6H  . multivitamin  15 mL Oral Daily  . ondansetron (ZOFRAN) IV  4 mg Intravenous Q6H   Or  . ondansetron  4 mg Oral Q6H  . sertraline  50 mg Oral Daily   Continuous Infusions: . ciprofloxacin Stopped (01/18/18 0740)  . dextrose 5 % and 0.9 % NaCl with KCl 40 mEq/L 125 mL/hr at 01/18/18 0647  . famotidine (PEPCID) IV Stopped (01/18/18 1020)  . metronidazole Stopped (01/18/18 1215)     LOS: 1 day    Time spent in minutes: 35    Debbe Odea, MD [facility] Pager: [email or web address] Password [email or web address] 01/18/2018, 1:53 PM

## 2018-01-18 NOTE — Progress Notes (Signed)
Initial Nutrition Assessment  DOCUMENTATION CODES:   Severe malnutrition in context of chronic illness, Underweight  INTERVENTION:  - Will order Boost Breeze TID, each supplement provides 250 kcal and 9 grams of protein. - Will order 30 mL Prostat once/day, this supplement provides 100 kcal and 15 grams of protein. - Will order daily 15 mL liquid multivitamin. - Diet advancement as medically feasible. - Continue to encourage PO intakes, as tolerated.   NUTRITION DIAGNOSIS:   Severe Malnutrition related to chronic illness(Crohn's disease s/p colectomy with colostomy) as evidenced by severe muscle depletion, severe fat depletion.  GOAL:   Patient will meet greater than or equal to 90% of their needs  MONITOR:   PO intake, Supplement acceptance, Diet advancement, Weight trends, Labs, I & O's  REASON FOR ASSESSMENT:   Malnutrition Screening Tool  ASSESSMENT:   57 y.o. male with medical history of Crohn's disease s/p subtotal colectomy with colostomy, depression who presents for nausea, vomiting and abdominal pain for 3- 4 days PTA. Vomitus is non-bloody. He is still having stool in his ostomy. Pain is in the right lower abdomen and sharp. He did try to take ODT Zofran but feels he vomited it up. He received an Entyvio injection the day before symptoms started and per his sister, the frequency of episodes has decreased since beginning the infusions.  BMI indicates underweight status. Per review, pt was able to tolerate 100% of CLD breakfast this AM with no worsening in symptoms. Pt reports not feeling well with associated N/V and pain with intakes for a few days PTA. At baseline he limits/avoids spicy and acidic items but does okay with most other items, to include dairy items. He does report that he has tried creamy Boost products and Ensure in the past and that he has increase in ostomy output when he consumes these. Will trial Boost Breeze while pt is on CLD. He typically eats several  small meals (4-6) per day and tries to drink adequate amounts of water each day. He also drinks [employer] when he can afford it to increase protein to maintain strength.   Pt reports that his weight has been stable in the 120s for >1 year but that he feels he may have lost weight in the past 1-2 months. Per chart review, pt has been gaining weight since November 2018. NFPE outlined below. Pt reports difficulties with gaining weight despite a focus on protein-rich and higher calorie foods.   Medications reviewed; 40 mg Solu-medrol QID. Labs reviewed; Ca: 8.2 mg/dL.  IVF: D5-NS-40 mEq KCl @ 125 mL/hr (510 kcal).     NUTRITION - FOCUSED PHYSICAL EXAM:    Most Recent Value  Orbital Region  Moderate depletion  Upper Arm Region  Severe depletion  Thoracic and Lumbar Region  Severe depletion  Buccal Region  Moderate depletion  Temple Region  Moderate depletion  Clavicle Bone Region  Severe depletion  Clavicle and Acromion Bone Region  Severe depletion  Scapular Bone Region  Severe depletion  Dorsal Hand  Severe depletion  Patellar Region  Severe depletion  Anterior Thigh Region  Severe depletion  Posterior Calf Region  Severe depletion  Edema (RD Assessment)  None  Hair  Reviewed  Eyes  Reviewed  Mouth  Reviewed  Skin  Reviewed  Nails  Reviewed       Diet Order:   Diet Order           Diet clear liquid Room service appropriate? Yes; Fluid consistency: Thin  Diet effective now          EDUCATION NEEDS:   No education needs have been identified at this time  Skin:  Skin Assessment: Reviewed RN Assessment  Last BM:  5/21 via colostomy (600 mL)  Height:   Ht Readings from Last 1 Encounters:  01/18/18 5' 11"  (1.803 m)    Weight:   Wt Readings from Last 1 Encounters:  01/18/18 128 lb (58.1 kg)    Ideal Body Weight:  78.18 kg  BMI:  Body mass index is 17.85 kg/m.  Estimated Nutritional Needs:   Kcal:  3536-1443 (30-35 kcal/kg)  Protein:   70-80 grams  Fluid:  >/= 2.2 L/day      Jarome Matin, MS, RD, LDN, [facility] Inpatient Clinical Dietitian Pager # [phone] After hours/weekend pager # [phone]

## 2018-01-19 LAB — HIGH SENSITIVITY CRP: CRP, High Sensitivity: 1.66 mg/L (ref 0.00–3.00)

## 2018-01-19 LAB — CBC
HCT: 33.6 % — ABNORMAL LOW (ref 39.0–52.0)
Hemoglobin: 10.8 g/dL — ABNORMAL LOW (ref 13.0–17.0)
MCH: 29.4 pg (ref 26.0–34.0)
MCHC: 32.1 g/dL (ref 30.0–36.0)
MCV: 91.6 fL (ref 78.0–100.0)
Platelets: 151 10*3/uL (ref 150–400)
RBC: 3.67 MIL/uL — ABNORMAL LOW (ref 4.22–5.81)
RDW: 16.8 % — ABNORMAL HIGH (ref 11.5–15.5)
WBC: 8.2 10*3/uL (ref 4.0–10.5)

## 2018-01-19 MED ORDER — CHOLESTYRAMINE LIGHT 4 G PO PACK
4.0000 g | PACK | Freq: Every day | ORAL | Status: DC
  Administered 2018-01-19 – 2018-01-20 (×2): 4 g via ORAL
  Filled 2018-01-19 (×2): qty 1

## 2018-01-19 NOTE — Progress Notes (Signed)
Patient  had episode of twitching/restlessness due to phenergan administration. Ativan 0.5 administered. Vitals stable.

## 2018-01-19 NOTE — Progress Notes (Addendum)
[facility] Progress Note  CC:  N/V/D/AP  Subjective:  Feeling much better.  Tolerating full liquids.  No further vomiting.  Ostomy output less.  Abdominal pain much better.  CRP normal and fecal calprotectin pending.  Objective:  Vital signs in last 24 hours: Temp:  [97.7 F (36.5 C)-98.2 F (36.8 C)] 97.7 F (36.5 C) (05/22 0543) Pulse Rate:  [49-89] 49 (05/22 0543) Resp:  [16-17] 16 (05/22 0543) BP: (100-138)/(65-88) 100/65 (05/22 0543) SpO2:  [99 %] 99 % (05/22 0543) Last BM Date: 01/18/18(ileostomy) General:  Alert, thin, in NAD Heart:  Bradycardic; no murmurs Pulm:  CTAB.  No increased WOB. Abdomen:  Soft, non-distended.  BS present.  Minimal TTP. Extremities:  Without edema. Neurologic:  Alert and oriented x 4;  grossly normal neurologically. Psych:  Alert and cooperative. Normal mood and affect.  Intake/Output from previous day: 05/21 0701 - 05/22 0700 In: 4794.2 [P.O.:1520; I.V.:2774.2; IV Piggyback:500] Out: 1825 [Urine:625; Stool:1200] Intake/Output this shift: Total I/O In: 360 [P.O.:360] Out: -   Lab Results: Recent Labs    01/17/18 1425 01/18/18 0537 01/19/18 0536  WBC 5.4 2.1* 8.2  HGB 14.7 11.9* 10.8*  HCT 44.7 36.8* 33.6*  PLT 203 152 151   BMET Recent Labs    01/17/18 1425 01/18/18 0537  NA 142 139  K 3.7 4.0  CL 107 111  CO2 24 21*  GLUCOSE 115* 154*  BUN 13 10  CREATININE 1.40* 1.13  CALCIUM 9.4 8.2*   LFT Recent Labs    01/17/18 1425  PROT 7.7  ALBUMIN 3.4*  AST 21  ALT 15*  ALKPHOS 71  BILITOT 0.9   Ct Abdomen Pelvis W Contrast  Result Date: 01/17/2018 CLINICAL DATA:  Right lower quadrant abdominal pain, nausea/vomiting/diarrhea, history of Crohn's disease EXAM: CT ABDOMEN AND PELVIS WITH CONTRAST TECHNIQUE: Multidetector CT imaging of the abdomen and pelvis was performed using the standard protocol following bolus administration of intravenous contrast. CONTRAST:  172m ISOVUE-300 IOPAMIDOL  (ISOVUE-300) INJECTION 61% COMPARISON:  09/14/2017 FINDINGS: Lower chest: Mild subpleural scarring/atelectasis in the left lower lobe. Hepatobiliary: Liver is within normal limits. Status post cholecystectomy.  No intrahepatic ductal dilatation. Mildly prominent common duct, measuring 8 mm, unchanged and likely postsurgical. Pancreas: Within normal limits. Spleen: Within normal limits. Adrenals/Urinary Tract: Adrenal glands are within normal limits. Small bilateral renal cysts, measuring up to 10 mm in the medial left upper kidney (series 7/image 15), likely benign. Bladder is within normal limits. Stomach/Bowel: Stomach is within normal limits. 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 No evidence of bowel obstruction. Mild wall thickening involving a loop of ileum in the left mid abdomen (series 2/image 53), with pericolonic inflammatory changes. This appearance is significantly improved from the prior, although active inflammatory Crohn's disease persists. Vascular/Lymphatic: No evidence of abdominal aortic aneurysm. No suspicious abdominopelvic lymphadenopathy. Reproductive: Prostate is unremarkable. Other: No abdominopelvic ascites. Musculoskeletal: Mild degenerative changes at L5-S1. IMPRESSION: Mild inflammatory changes involving a loop of ileum in the left mid abdomen, reflecting active inflammatory Crohn's disease, significantly improved from the prior. 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 No evidence of bowel obstruction. Additional stable ancillary findings as above. Electronically Signed   By: SJulian HyM.D.   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 Found to have subtherapeutic Entyvio levels at that time, now on increased frequency of dosing evert 4 weeks.  Was feeling good last week.  Now with N/V/D (increased ostomy output) and abdominal pain.  CT scan shows mild active Crohn's at previously affected site, but improved from 08/2017.  ? If this acute episode was from his Crohn's vs infectious source, hard to know.  Stool studies negative and CRP normal.  Fecal calprotectin pending.  Clinically much improved.  -Continue IV steroids for now at 40 mg every 12 hours.  Plan to switch to PO prednisone at discharge with 40 mg x 1 week, 30 mg x 1 week, 20 mg x 2 weeks, 10 mg x 2 weeks, then discontinue. -Discontinue IV cipro and flagyl. -Await results of fecal calprotectin. -Will advance to full liquid.  If he tolerates this well then likely can be discharged home morning of 5/23.     LOS: 2 days   Laban Emperor. Zehr  01/19/2018, 10:51 AM     Attending physician's note   I have taken an interval history, reviewed the chart and examined the patient. I agree with the Advanced Practitioner's note, impression and recommendations.   57 year old with severe penetrating/stricturing Crohn's disease with history of multiple SB resections, short gut syndrome, intolerance to multiple treatments including several anti-TNFs, 6MP and sulfasalazine has been doing well on Jonny Ruiz (has been able to wean off steroids), Last colon 08/2017- neo-TI active crohn's without stricture.  Admitted with another exacerbation of Crohn's disease.  No abscesses.  Plan:  Switch to p.o. prednisone tomorrow as above.  Advance diet.  Stop antibiotics.  Trial of cholestyramine 4 g p.o. once a day, continue Entyvio as outpatient.  No endoscopic intervention planned this admission.  Discussed with the family.   Carmell Austria, MD

## 2018-01-19 NOTE — Progress Notes (Signed)
PROGRESS NOTE    Gregory [name].   UVO:[id number]  DOB: December 01, 1960  DOA: 01/17/2018 PCP: Billie Ruddy, MD   Brief Narrative:  Gregory [name].   is a 57 y.o. male with medical history of Crohn's disease s/p subtotal colectomy with colostomy, depression  who presents for nausea, vomiting and abdominal pain. This has been present for 3- 4 days now. Vomitus is non-bloody. He is still having stool in his ostomy which is liquid. Pain is in the right lower abdomen and sharp. He did try to take ODT Zofran but feels he vomited it up. He received an Entyvio injection the day before symptoms started and per his sister, the frequency of episodes has decreased since beginning the infusions  Subjective: Abdominal pain is better, stool is still liquid although frequency is less.  No nausea no vomiting no fever no chills.  No blood in the stool    Assessment & Plan:   Crohn's disease of both small and lg int w unsp comp Vomiting/ RLQ pain  - started Solumedrol, Cipro and Flagyl - cont Zofran IV QID and Phenergan PRN - Pepcid BID - dilaudid and oxycodone PRN for pain - follow I and O carefully - ED consulted [facility]- he follows with Dr Havery Moros - GI ordered stool for c diff which is negative and pathogen panel and stool culture which are also negative. Antibiotics were discontinued, patient switched to oral prednisone.  Also started on cholestyramine.  Monitor overnight and hopefully can discharge home tomorrow.  AKI Dehydration - improved- cont IVF (D5NS with K at 40 meq)  Leukopenia/ anemia - recheck labs tomorrow    Depression - cont Zoloft  Malnourished/underweight Weighs 122 lbs - dietician has started supplements    DVT prophylaxis: Lovenox Code Status: DNR Family Communication: sister Disposition Plan: home in 1-2 days Consultants:   [facility] Procedures:   none Antimicrobials:  Anti-infectives (From admission, onward)   Start     Dose/Rate Route  Frequency Ordered Stop   01/17/18 1800  ciprofloxacin (CIPRO) IVPB 400 mg  Status:  Discontinued     400 mg 200 mL/hr over 60 Minutes Intravenous Every 12 hours 01/17/18 1758 01/19/18 1416   01/17/18 1800  metroNIDAZOLE (FLAGYL) IVPB 500 mg  Status:  Discontinued     500 mg 100 mL/hr over 60 Minutes Intravenous Every 8 hours 01/17/18 1758 01/19/18 1416       Objective: Vitals:   01/18/18 2200 01/19/18 0541 01/19/18 0543 01/19/18 1312  BP: 114/76 100/65 100/65 108/72  Pulse: (!) 54 (!) 49 (!) 49 61  Resp: 16  16 14   Temp: 98.2 F (36.8 C) 97.7 F (36.5 C) 97.7 F (36.5 C) 97.9 F (36.6 C)  TempSrc: Oral Oral Oral Oral  SpO2: 99% 99% 99% 99%  Weight:      Height:        Intake/Output Summary (Last 24 hours) at 01/19/2018 1720 Last data filed at 01/19/2018 1706 Gross per 24 hour  Intake 2625.84 ml  Output 975 ml  Net 1650.84 ml   Filed Weights   01/18/18 0900  Weight: 58.1 kg (128 lb)    Examination: General exam: Appears comfortable  HEENT: PERRLA, oral mucosa moist, no sclera icterus or thrush Respiratory system: Clear to auscultation. Respiratory effort normal. Cardiovascular system: S1 & S2 heard, RRR.   Gastrointestinal system: Abdomen soft,  -tender in RLQ still, nondistended. Normal bowel sound. No organomegaly Central nervous system: Alert and oriented. No focal neurological  deficits. Extremities: No cyanosis, clubbing or edema Skin: No rashes or ulcers Psychiatry:  Mood & affect appropriate.     Data Reviewed: I have personally reviewed following labs and imaging studies  CBC: Recent Labs  Lab 01/17/18 1425 01/18/18 0537 01/19/18 0536  WBC 5.4 2.1* 8.2  HGB 14.7 11.9* 10.8*  HCT 44.7 36.8* 33.6*  MCV 89.9 90.9 91.6  PLT 203 152 157   Basic Metabolic Panel: Recent Labs  Lab 01/17/18 1425 01/18/18 0537  NA 142 139  K 3.7 4.0  CL 107 111  CO2 24 21*  GLUCOSE 115* 154*  BUN 13 10  CREATININE 1.40* 1.13  CALCIUM 9.4 8.2*    GFR: Estimated Creatinine Clearance: 60 mL/min (by C-G formula based on SCr of 1.13 mg/dL). Liver Function Tests: Recent Labs  Lab 01/17/18 1425  AST 21  ALT 15*  ALKPHOS 71  BILITOT 0.9  PROT 7.7  ALBUMIN 3.4*   Recent Labs  Lab 01/17/18 1425  LIPASE 60*   No results for input(s): AMMONIA in the last 168 hours. Coagulation Profile: No results for input(s): INR, PROTIME in the last 168 hours. Cardiac Enzymes: Recent Labs  Lab 01/17/18 1425  TROPONINI <0.03   BNP (last 3 results) No results for input(s): PROBNP in the last 8760 hours. HbA1C: No results for input(s): HGBA1C in the last 72 hours. CBG: No results for input(s): GLUCAP in the last 168 hours. Lipid Profile: No results for input(s): CHOL, HDL, LDLCALC, TRIG, CHOLHDL, LDLDIRECT in the last 72 hours. Thyroid Function Tests: No results for input(s): TSH, T4TOTAL, FREET4, T3FREE, THYROIDAB in the last 72 hours. Anemia Panel: No results for input(s): VITAMINB12, FOLATE, FERRITIN, TIBC, IRON, RETICCTPCT in the last 72 hours. Urine analysis:    Component Value Date/Time   COLORURINE YELLOW 10/20/2017 [place] 10/20/2017 1418   LABSPEC 1.020 10/20/2017 1418   PHURINE 6.0 10/20/2017 1418   GLUCOSEU NEGATIVE 10/20/2017 1418   HGBUR NEGATIVE 10/20/2017 1418   [place] 10/20/2017 1418   KETONESUR NEGATIVE 10/20/2017 1418   PROTEINUR NEGATIVE 09/14/2017 1855   UROBILINOGEN 0.2 10/20/2017 1418   NITRITE NEGATIVE 10/20/2017 1418   LEUKOCYTESUR NEGATIVE 10/20/2017 1418   Sepsis Labs: @LABRCNTIP (procalcitonin:4,lacticidven:4) ) Recent Results (from the past 240 hour(s))  Gastrointestinal Panel by PCR , Stool     Status: None   Collection Time: 01/17/18  6:07 PM  Result Value Ref Range Status   Campylobacter species NOT DETECTED NOT DETECTED Final   Plesimonas shigelloides NOT DETECTED NOT DETECTED Final   Salmonella species NOT DETECTED NOT DETECTED Final   Yersinia  enterocolitica NOT DETECTED NOT DETECTED Final   Vibrio species NOT DETECTED NOT DETECTED Final   Vibrio cholerae NOT DETECTED NOT DETECTED Final   Enteroaggregative E coli (EAEC) NOT DETECTED NOT DETECTED Final   Enteropathogenic E coli (EPEC) NOT DETECTED NOT DETECTED Final   Enterotoxigenic E coli (ETEC) NOT DETECTED NOT DETECTED Final   Shiga like toxin producing E coli (STEC) NOT DETECTED NOT DETECTED Final   Shigella/Enteroinvasive E coli (EIEC) NOT DETECTED NOT DETECTED Final   Cryptosporidium NOT DETECTED NOT DETECTED Final   Cyclospora cayetanensis NOT DETECTED NOT DETECTED Final   Entamoeba histolytica NOT DETECTED NOT DETECTED Final   Giardia lamblia NOT DETECTED NOT DETECTED Final   Adenovirus F40/41 NOT DETECTED NOT DETECTED Final   Astrovirus NOT DETECTED NOT DETECTED Final   Norovirus GI/GII NOT DETECTED NOT DETECTED Final   Rotavirus A NOT DETECTED NOT DETECTED Final  Sapovirus (I, II, IV, and V) NOT DETECTED NOT DETECTED Final    Comment: Performed at [facility], [address] [place], [place] [address]  C difficile quick scan w PCR reflex     Status: None   Collection Time: 01/17/18  6:07 PM  Result Value Ref Range Status   C Diff antigen NEGATIVE NEGATIVE Final   C Diff toxin NEGATIVE NEGATIVE Final   C Diff interpretation No C. difficile detected.  Final    Comment: Performed at [facility], [place] [address] [place], [place] [address]         Radiology Studies: No results found.    Scheduled Meds: . cholestyramine  4 g Oral Daily  . enoxaparin (LOVENOX) injection  40 mg Subcutaneous Q24H  . feeding supplement (PRO-STAT SUGAR FREE 64)  30 mL Oral Daily  . methylPREDNISolone (SOLU-MEDROL) injection  40 mg Intravenous Q12H  . multivitamin  15 mL Oral Daily  . ondansetron (ZOFRAN) IV  4 mg Intravenous Q6H   Or  . ondansetron  4 mg Oral Q6H  . sertraline  50 mg Oral Daily   Continuous Infusions: . dextrose 5 % and 0.9 %  NaCl with KCl 40 mEq/L 125 mL/hr at 01/19/18 1408  . famotidine (PEPCID) IV Stopped (01/19/18 1110)     LOS: 2 days    Time spent in minutes: [place], MD [facility] Pager: [email or web address] Password [email or web address] 01/19/2018, 5:20 PM

## 2018-01-20 LAB — CALPROTECTIN, FECAL: Calprotectin, Fecal: 53 ug/g (ref 0–120)

## 2018-01-20 MED ORDER — PREDNISONE 10 MG PO TABS: ORAL_TABLET | ORAL | 0 refills | Status: DC

## 2018-01-20 MED ORDER — TRAMADOL HCL 50 MG PO TABS: 50.0000 mg | ORAL_TABLET | Freq: Four times a day (QID) | ORAL | 0 refills | Status: DC | PRN

## 2018-01-20 MED ORDER — PREDNISONE 20 MG PO TABS
40.0000 mg | ORAL_TABLET | Freq: Every day | ORAL | Status: DC
  Administered 2018-01-20: 40 mg via ORAL
  Filled 2018-01-20: qty 2

## 2018-01-20 MED ORDER — PRO-STAT SUGAR FREE PO LIQD: 30.0000 mL | Freq: Every day | ORAL | 0 refills | Status: DC

## 2018-01-20 MED ORDER — CHOLESTYRAMINE LIGHT 4 G PO PACK: 4.0000 g | PACK | Freq: Every day | ORAL | 0 refills | Status: DC

## 2018-01-20 NOTE — Progress Notes (Addendum)
     [facility] Progress Note  CC:  N/V/D/AP  Subjective:  Feeling good.  Tolerating soft diet.  Objective:  Vital signs in last 24 hours: Temp:  [97.9 F (36.6 C)-98.5 F (36.9 C)] 98.4 F (36.9 C) (05/23 0527) Pulse Rate:  [44-61] 44 (05/23 0527) Resp:  [14-17] 17 (05/23 0527) BP: (108-118)/(70-74) 118/74 (05/23 0527) SpO2:  [98 %-99 %] 99 % (05/23 0527) Last BM Date: 01/19/18(ileostomy) General: Alert, thin and chronically ill-appearing, in NAD Heart:  Bradycardic; no murmurs Pulm:  CTAB.  No increased WOB. Abdomen:  Soft, non-distended.  BS present.  Non-tender.  Ostomy noted. Extremities:  Without edema. Neurologic:  Alert and oriented x 4;  grossly normal neurologically. Psych:  Alert and cooperative. Normal mood and affect.  Intake/Output from previous day: 05/22 0701 - 05/23 0700 In: 4172.9 [P.O.:1200; I.V.:2772.9; IV Piggyback:200] Out: 2525 [Urine:1000; IYMEB:5830] Intake/Output this shift: Total I/O In: 240 [P.O.:240] Out: -   Lab Results: Recent Labs    01/17/18 1425 01/18/18 0537 01/19/18 0536  WBC 5.4 2.1* 8.2  HGB 14.7 11.9* 10.8*  HCT 44.7 36.8* 33.6*  PLT 203 152 151   BMET Recent Labs    01/17/18 1425 01/18/18 0537  NA 142 139  K 3.7 4.0  CL 107 111  CO2 24 21*  GLUCOSE 115* 154*  BUN 13 10  CREATININE 1.40* 1.13  CALCIUM 9.4 8.2*   LFT Recent Labs    01/17/18 1425  PROT 7.7  ALBUMIN 3.4*  AST 21  ALT 15*  ALKPHOS 71  BILITOT 0.9   Assessment / Plan: *57 year old male with 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 Found to have subtherapeutic Entyvio levels at that time, now on increased frequency of dosing evert 4  weeks.Was feeling good last week. Now with N/V/D (increased ostomy output) and abdominal pain. CT scan shows mild active Crohn's at previously affected site, but improved from 08/2017. ? If this acute episode was from his Crohn's vs infectious source, hard to know. Stool studies negative and CRP normal.  Fecal calprotectin pending.  Clinically much improved.  Was given IV steroids and IV cipro and flagyl.  -Plan to switch to PO prednisone at discharge (today) with 40 mg x 1 week, 30 mg x 1 week, 20 mg x 2 weeks, 10 mg x 2 weeks, then discontinue. -Questran once daily added to regimen on 5/22. -[employer] for discharge from GI standpoint  Follow-up appt with Dr. Havery Moros in discharge paperwork.    LOS: 3 days   Laban Emperor. Zehr  01/20/2018, 9:11 AM   Attending physician's note   I have taken an interval history, reviewed the chart and examined the patient. I agree with the Advanced Practitioner's note, impression and recommendations.   Doing extremely well.  Steroid taper as above.  Added cholestyramine 4 g p.o. once a day.  Can increase it to twice a day.  Continue with scheduled Entyvio.   Carmell Austria, MD

## 2018-01-20 NOTE — Discharge Summary (Addendum)
[facility] Discharge Summary   Patient: Gregory [name]. UXL:[id number]   PCP: Gregory Ruddy, MD DOB: 06/16/1961   Date of admission: 01/17/2018   Date of discharge:  01/20/2018    Discharge Diagnoses:  Principal Problem:   Crohn's disease of both small and lg int w unsp comp ([place]) Active Problems:   Depression   AKI (acute kidney injury) ([place])   Dehydration   Admitted From: home Disposition:  home  Recommendations for Outpatient Follow-up:  1. Please follow-up with [name] in 1 week  Follow-up Information    Armbruster, Gregory Raspberry, MD Follow up on 03/18/2018.   Specialty:  Gastroenterology Why:  9:00 am Contact information: [address] [place] [address] [phone]          Diet recommendation: regular diet  Activity: The patient is advised to gradually reintroduce usual activities.  Discharge Condition: good  Code Status: DNR DNI  History of present illness: As per the H and P dictated on admission, "Gregory [name]. is a 57 y.o. male with medical history of Crohn's disease s/p subtotal colectomy with colostomy, depression  who presents for nausea, vomiting and abdominal pain. This has been present for 3- 4 days now. Vomitus is non-bloody. He is still having stool in his ostomy. Pain is in the right lower abdomen and sharp. He did try to take ODT Zofran but feels he vomited it up. He received an Entyvio injection the day before symptoms started and per his sister, the frequency of episodes has decreased since beginning the infusions. No c/o fever or chills."  Hospital Course:  Summary of his active problems in the hospital is as following. Crohn's disease of both small and lg int w unsp comp Vomiting/ RLQ pain  - started Solumedrol, Cipro and Flagyl - cont Zofran IV QID and Phenergan PRN - Pepcid BID - dilaudid and oxycodone PRN for pain - follow I and O carefully - ED consulted Gregory [name]- he follows with Dr Gregory [name] - [name] ordered stool  for c diff which is negative and pathogen panel and stool culture which are also negative. Antibiotics were discontinued, patient switched to oral prednisone. Also started on cholestyramine.   AKI Dehydration - improved with IVF (D5NS with K at 40 meq)  Leukopenia/ anemia - stable  Depression - cont Zoloft  Severe malnutrition in context of chronic illness, Underweight Weighs 122 lbs - dietician has started supplements  All other chronic medical condition were stable during the hospitalization.  Patient was ambulatory without any assistance. On the day of the discharge the patient's vitals were stable , and no other acute medical condition were reported by patient. the patient was felt safe to be discharge at home with family.  Consultants: gastroenterology  Procedures: none  DISCHARGE MEDICATION: Allergies as of 01/20/2018      Reactions   Methotrexate Derivatives Other (See Comments)   Anemia, low WBC, severe [name] symptoms,    Phenergan [promethazine Hcl] Other (See Comments)   twitching   Humira [adalimumab] Other (See Comments)   Intolerance   Penicillins Other (See Comments)   REACTION DURING SURGERY Has patient had a PCN reaction causing immediate rash, facial/tongue/throat swelling, SOB or lightheadedness with hypotension: Unknown Has patient had a PCN reaction causing severe rash involving mucus membranes or skin necrosis: Unknown Has patient had a PCN reaction that required hospitalization: Unknown Has patient had a PCN reaction occurring within the last 10 years: No If all of the above  answers are "NO", then may proceed with Cephalosporin use.   Remicade [infliximab] Other (See Comments)   Intolerance      Medication List    STOP taking these medications   diphenoxylate-atropine 2.5-0.025 MG tablet Commonly known as:  LOMOTIL     TAKE these medications   acetaminophen 500 MG tablet Commonly known as:  TYLENOL Take 500 mg by mouth every 6 (six)  hours as needed for moderate pain.   cholestyramine light 4 g packet Commonly known as:  PREVALITE Take 1 packet (4 g total) by mouth daily. Start taking on:  01/21/2018   feeding supplement (PRO-STAT SUGAR FREE 64) Liqd Take 30 mLs by mouth daily.   ferrous sulfate 325 (65 FE) MG tablet TAKE 1 TABLET (325 MG TOTAL) 2 (TWO) TIMES DAILY WITH A MEAL BY MOUTH.   ipratropium 0.03 % nasal spray Commonly known as:  ATROVENT Place 2 sprays 2 (two) times daily into both nostrils. Can use 3 times a day as needed   multivitamin with minerals Tabs tablet Take 1 tablet by mouth daily.   ondansetron 4 MG disintegrating tablet Commonly known as:  ZOFRAN-ODT Take 1 tablet (4 mg total) by mouth every 8 (eight) hours as needed for nausea or vomiting.   potassium chloride SA 20 MEQ tablet Commonly known as:  KLOR-CON M20 Take 1 tablet by mouth daily   predniSONE 10 MG tablet Commonly known as:  DELTASONE 40 mg x 1 week, 30 mg x 1 week, 20 mg x 2 weeks, 10 mg x 2 weeks, then discontinue   ranitidine 150 MG tablet Commonly known as:  ZANTAC Take 1 tablet (150 mg total) by mouth 2 (two) times daily. What changed:    when to take this  reasons to take this   sertraline 50 MG tablet Commonly known as:  ZOLOFT Take 1 tablet (50 mg total) by mouth daily.   traMADol 50 MG tablet Commonly known as:  ULTRAM Take 1 tablet (50 mg total) by mouth every 6 (six) hours as needed. What changed:  when to take this   Vitamin D (Ergocalciferol) 2000 units Caps Take 1 capsule by mouth daily.      Allergies  Allergen Reactions  . Methotrexate Derivatives Other (See Comments)    Anemia, low WBC, severe [name] symptoms,   . Phenergan [Promethazine Hcl] Other (See Comments)    twitching  . Humira [Adalimumab] Other (See Comments)    Intolerance  . Penicillins Other (See Comments)    REACTION DURING SURGERY Has patient had a PCN reaction causing immediate rash, facial/tongue/throat swelling, SOB or  lightheadedness with hypotension: Unknown Has patient had a PCN reaction causing severe rash involving mucus membranes or skin necrosis: Unknown Has patient had a PCN reaction that required hospitalization: Unknown Has patient had a PCN reaction occurring within the last 10 years: No If all of the above answers are "NO", then may proceed with Cephalosporin use.   . Remicade [Infliximab] Other (See Comments)    Intolerance   Discharge Instructions    Diet - low sodium heart healthy   Complete by:  As directed    Discharge instructions   Complete by:  As directed    It is important that you read following instructions as well as go over your medication list with RN to help you understand your care after this hospitalization.  Discharge Instructions: Please follow-up with PCP in one week  Please request your primary care physician to go over all Hospital Tests  and Procedure/Radiological results at the follow up,  Please get all Hospital records sent to your PCP by signing hospital release before you go home.   Do not drive, operating heavy machinery, perform activities at heights, swimming or participation in water activities or provide baby sitting services while you are on Pain, Sleep and Anxiety Medications; until you have been seen by Primary Care Physician or a Neurologist and advised to do so again. Do not take more than prescribed Pain, Sleep and Anxiety Medications. You were cared for by a hospitalist during your hospital stay. If you have any questions about your discharge medications or the care you received while you were in the hospital after you are discharged, you can call the unit and ask to speak with the hospitalist on call if the hospitalist that took care of you is not available.  Once you are discharged, your primary care physician will handle any further medical issues. Please note that NO REFILLS for any discharge medications will be authorized once you are discharged, as  it is imperative that you return to your primary care physician (or establish a relationship with a primary care physician if you do not have one) for your aftercare needs so that they can reassess your need for medications and monitor your lab values. You Must read complete instructions/literature along with all the possible adverse reactions/side effects for all the Medicines you take and that have been prescribed to you. Take any new Medicines after you have completely understood and accept all the possible adverse reactions/side effects. Wear Seat belts while driving. If you have smoked or chewed Tobacco in the last 2 yrs please stop smoking and/or stop any Recreational drug use.   Increase activity slowly   Complete by:  As directed      Discharge Exam: Filed Weights   01/18/18 0900  Weight: 58.1 kg (128 lb)   Vitals:   01/20/18 0527 01/20/18 1319  BP: 118/74 115/71  Pulse: (!) 44 (!) 45  Resp: 17 16  Temp: 98.4 F (36.9 C) 98 F (36.7 C)  SpO2: 99% 100%   General: Appear in no distress, no Rash; Oral Mucosa moist. Cardiovascular: S1 and S2 Present, no Murmur, no JVD Respiratory: Bilateral Air entry present and Clear to Auscultation, no Crackles, no wheezes Abdomen: Bowel Sound present, Soft and no tenderness Extremities: no Pedal edema, no calf tenderness Neurology: Grossly no focal neuro deficit.  The results of significant diagnostics from this hospitalization (including imaging, microbiology, ancillary and laboratory) are listed below for reference.    Significant Diagnostic Studies: Ct Abdomen Pelvis W Contrast  Result Date: 01/17/2018 CLINICAL DATA:  Right lower quadrant abdominal pain, nausea/vomiting/diarrhea, history of Crohn's disease EXAM: CT ABDOMEN AND PELVIS WITH CONTRAST TECHNIQUE: Multidetector CT imaging of the abdomen and pelvis was performed using the standard protocol following bolus administration of intravenous contrast. CONTRAST:  111m ISOVUE-300  IOPAMIDOL (ISOVUE-300) INJECTION 61% COMPARISON:  09/14/2017 FINDINGS: Lower chest: Mild subpleural scarring/atelectasis in the left lower lobe. Hepatobiliary: Liver is within normal limits. Status post cholecystectomy.  No intrahepatic ductal dilatation. Mildly prominent common duct, measuring 8 mm, unchanged and likely postsurgical. Pancreas: Within normal limits. Spleen: Within normal limits. Adrenals/Urinary Tract: Adrenal glands are within normal limits. Small bilateral renal cysts, measuring up to 10 mm in the medial left upper kidney (series 7/image 15), likely benign. Bladder is within normal limits. Stomach/Bowel: Stomach is within normal limits. 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 No evidence  of bowel obstruction. Mild wall thickening involving a loop of ileum in the left mid abdomen (series 2/image 53), with pericolonic inflammatory changes. This appearance is significantly improved from the prior, although active inflammatory Crohn's disease persists. Vascular/Lymphatic: No evidence of abdominal aortic aneurysm. No suspicious abdominopelvic lymphadenopathy. Reproductive: Prostate is unremarkable. Other: No abdominopelvic ascites. Musculoskeletal: Mild degenerative changes at L5-S1. IMPRESSION: Mild inflammatory changes involving a loop of ileum in the left mid abdomen, reflecting active inflammatory Crohn's disease, significantly improved from the prior. 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 No evidence of bowel obstruction. Additional stable ancillary findings as above. Electronically Signed   By: Julian Hy M.D.   On: 01/17/2018 16:53    Microbiology: Recent Results (from the past 240 hour(s))  Gastrointestinal Panel by PCR , Stool     Status: None   Collection Time: 01/17/18  6:07 PM  Result Value Ref Range Status   Campylobacter species NOT DETECTED NOT DETECTED Final   Plesimonas shigelloides NOT DETECTED  NOT DETECTED Final   Salmonella species NOT DETECTED NOT DETECTED Final   Yersinia enterocolitica NOT DETECTED NOT DETECTED Final   Vibrio species NOT DETECTED NOT DETECTED Final   Vibrio cholerae NOT DETECTED NOT DETECTED Final   Enteroaggregative E coli (EAEC) NOT DETECTED NOT DETECTED Final   Enteropathogenic E coli (EPEC) NOT DETECTED NOT DETECTED Final   Enterotoxigenic E coli (ETEC) NOT DETECTED NOT DETECTED Final   Shiga like toxin producing E coli (STEC) NOT DETECTED NOT DETECTED Final   Shigella/Enteroinvasive E coli (EIEC) NOT DETECTED NOT DETECTED Final   Cryptosporidium NOT DETECTED NOT DETECTED Final   Cyclospora cayetanensis NOT DETECTED NOT DETECTED Final   Entamoeba histolytica NOT DETECTED NOT DETECTED Final   Giardia lamblia NOT DETECTED NOT DETECTED Final   Adenovirus F40/41 NOT DETECTED NOT DETECTED Final   Astrovirus NOT DETECTED NOT DETECTED Final   Norovirus [name]/GII NOT DETECTED NOT DETECTED Final   Rotavirus A NOT DETECTED NOT DETECTED Final   Sapovirus (I, II, IV, and V) NOT DETECTED NOT DETECTED Final    Comment: Performed at [facility], [address] [place], [place] [address]  Stool culture (children & immunocomp patients)     Status: None (Preliminary result)   Collection Time: 01/17/18  6:07 PM  Result Value Ref Range Status   Salmonella/Shigella Screen PENDING  Incomplete   Campylobacter Culture PENDING  Incomplete   E coli, Shiga toxin Assay Negative Negative Final    Comment: (NOTE) Performed At: [facility] [address] [place], [place] [id number] Rush Farmer MD QB:[id number] Performed at [facility], [place] [address] [place], [place] [address]   C difficile quick scan w PCR reflex     Status: None   Collection Time: 01/17/18  6:07 PM  Result Value Ref Range Status   C Diff antigen NEGATIVE NEGATIVE Final   C Diff toxin NEGATIVE NEGATIVE Final   C Diff interpretation No C. difficile detected.  Final      Comment: Performed at [facility], [place] [address] [place], [place] [address]     Labs: CBC: Recent Labs  Lab 01/17/18 1425 01/18/18 0537 01/19/18 0536  WBC 5.4 2.1* 8.2  HGB 14.7 11.9* 10.8*  HCT 44.7 36.8* 33.6*  MCV 89.9 90.9 91.6  PLT 203 152 150   Basic Metabolic Panel: Recent Labs  Lab 01/17/18 1425 01/18/18 0537  NA 142 139  K 3.7 4.0  CL 107 111  CO2 24  21*  GLUCOSE 115* 154*  BUN 13 10  CREATININE 1.40* 1.13  CALCIUM 9.4 8.2*   Liver Function Tests: Recent Labs  Lab 01/17/18 1425  AST 21  ALT 15*  ALKPHOS 71  BILITOT 0.9  PROT 7.7  ALBUMIN 3.4*   Recent Labs  Lab 01/17/18 1425  LIPASE 60*   No results for input(s): AMMONIA in the last 168 hours. Cardiac Enzymes: Recent Labs  Lab 01/17/18 1425  TROPONINI <0.03   BNP (last 3 results) No results for input(s): BNP in the last 8760 hours. CBG: No results for input(s): GLUCAP in the last 168 hours. Time spent: 35 minutes  Signed:  Berle Mull  [facility]  01/20/2018  , 2:19 PM

## 2018-01-20 NOTE — Care Management Important Message (Signed)
Important Message  Patient Details  Name: Gregory [name]. MRN: [id number] Date of Birth: 11-May-1961   Medicare Important Message Given:  Yes    Kerin Salen 01/20/2018, 11:33 AMImportant Message  Patient Details  Name: Gregory [name]. MRN: [id number] Date of Birth: 01-12-1961   Medicare Important Message Given:  Yes    Kerin Salen 01/20/2018, 11:33 AM

## 2018-01-23 LAB — STOOL CULTURE: E COLI SHIGA TOXIN ASSAY: NEGATIVE

## 2018-01-23 LAB — STOOL CULTURE REFLEX - CMPCXR

## 2018-01-23 LAB — STOOL CULTURE REFLEX - RSASHR

## 2018-01-24 IMAGING — CT CT MAXILLOFACIAL W/O CM
4 of 11 series · 15 of 47 positions shown, 17 images · non-contrast
Comparison: None.

CLINICAL DATA: Unwitnessed fall at rehab facility, head and neck
trauma

EXAM:
CT HEAD WITHOUT CONTRAST
CT MAXILLOFACIAL WITHOUT CONTRAST
CT CERVICAL SPINE WITHOUT CONTRAST
TECHNIQUE: Multidetector CT imaging of the head, cervical spine, and
maxillofacial structures were performed using the standard protocol
without intravenous contrast. Multiplanar CT image reconstructions
of the cervical spine and maxillofacial structures were also
generated.

[Series 6: sagittal st · sagittal · 0.38mm/px · 1 of 77 slices shown]
[im 39/77  bone]
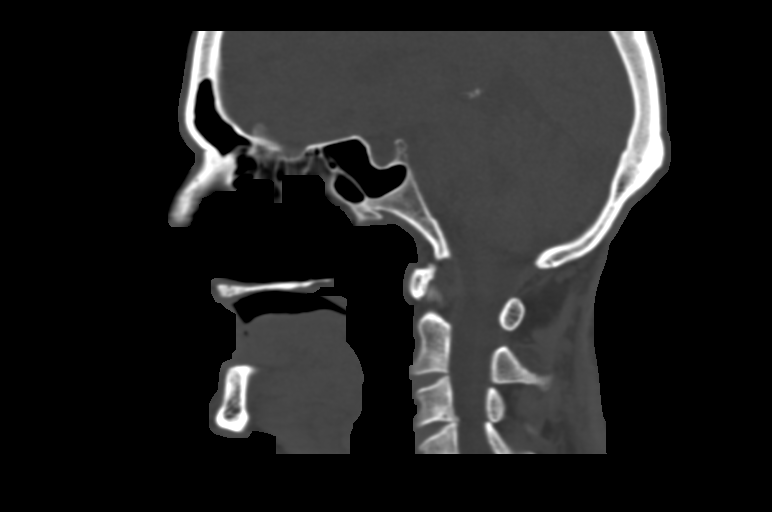

[Series 14: coronal · coronal · 0.35mm/px · 3 of 66 slices shown]
[im 17/66  bone]
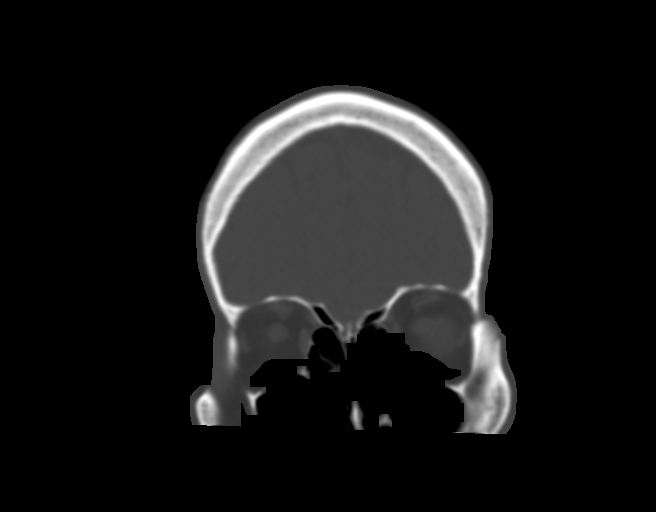
[im 33/66  bone]
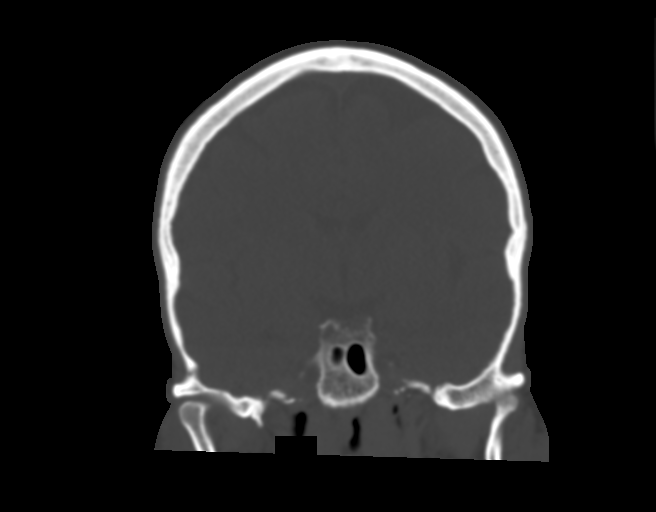
[im 49/66  bone]
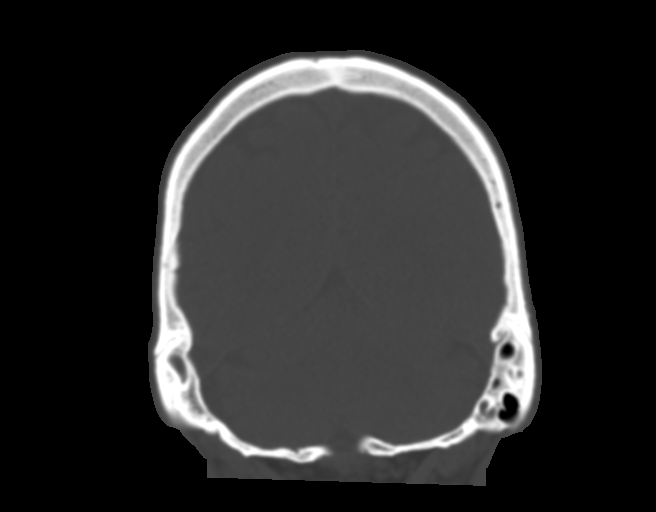

[Series 16: c-spine st · axial · 0.28mm/px · z∈[-294,-204]mm · 4 of 106 slices shown]
[im 16/106  bone]
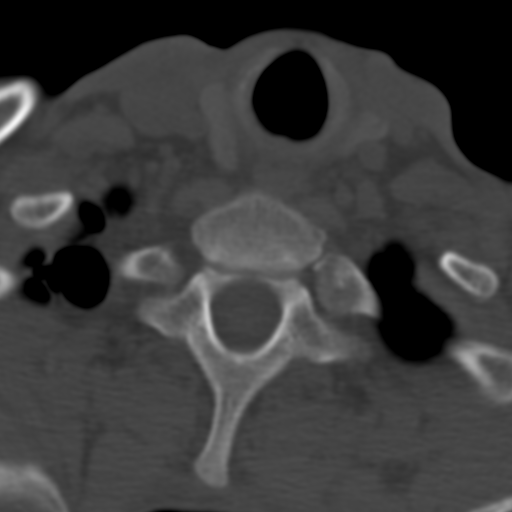
[im 31/106  bone]
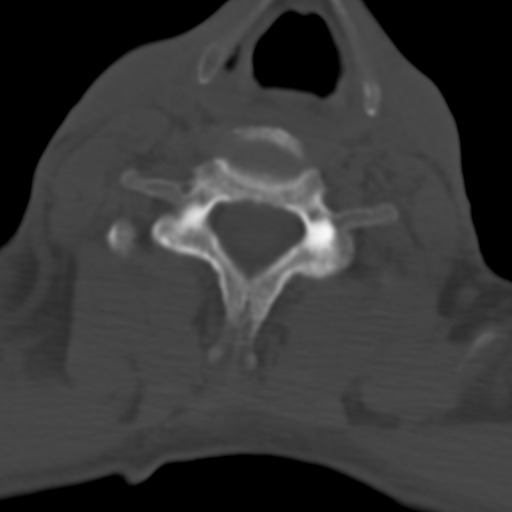
[im 46/106  bone]
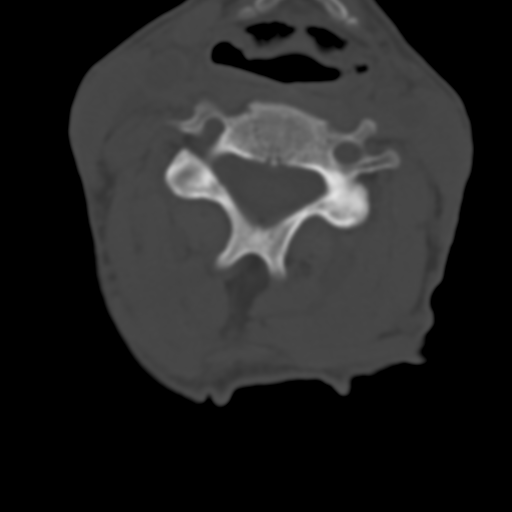
[im 61/106  bone]
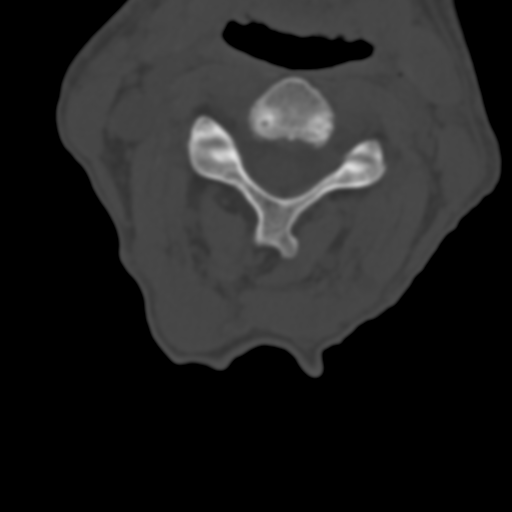

[Series 20: axial · axial · 0.26mm/px · z∈[-323,-151]mm · 7 of 124 slices shown, 9 images]
[im 16/124  brain]
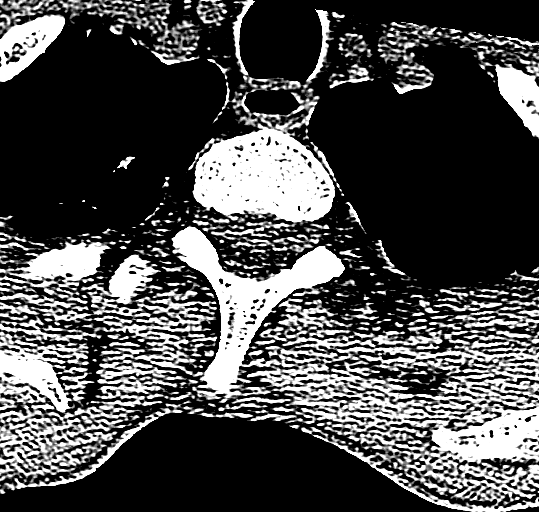
[im 16/124  bone]
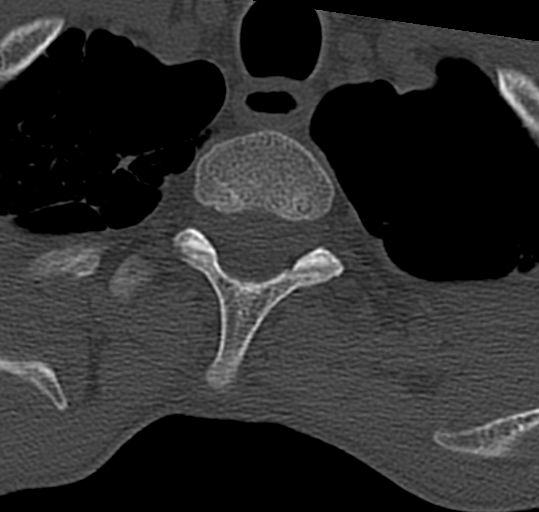
[im 31/124  bone]
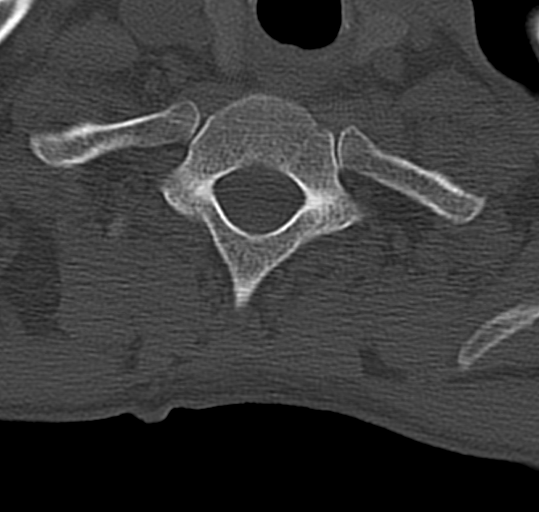
[im 47/124  bone]
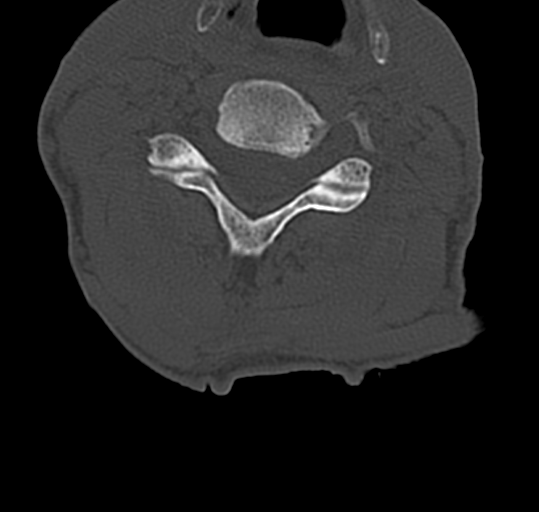
[im 62/124  bone]
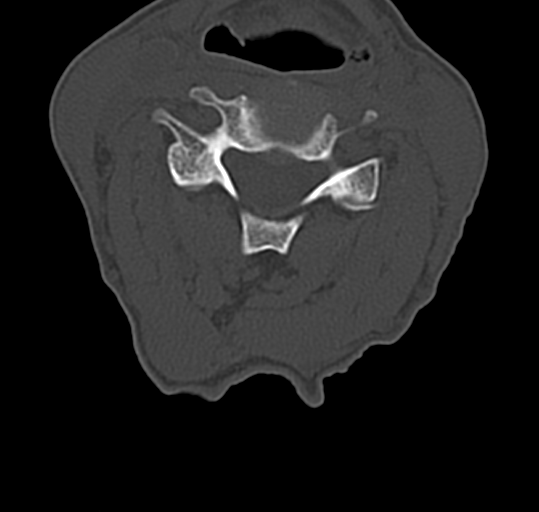
[im 77/124  brain]
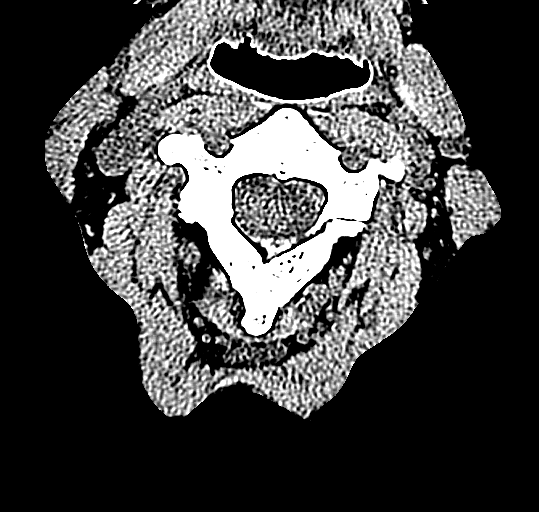
[im 77/124  bone]
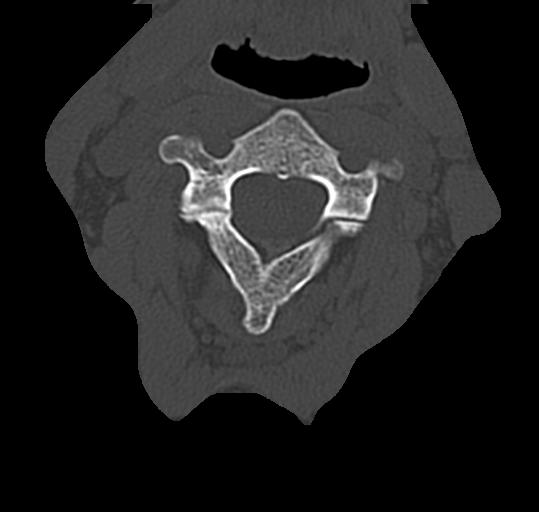
[im 93/124  bone]
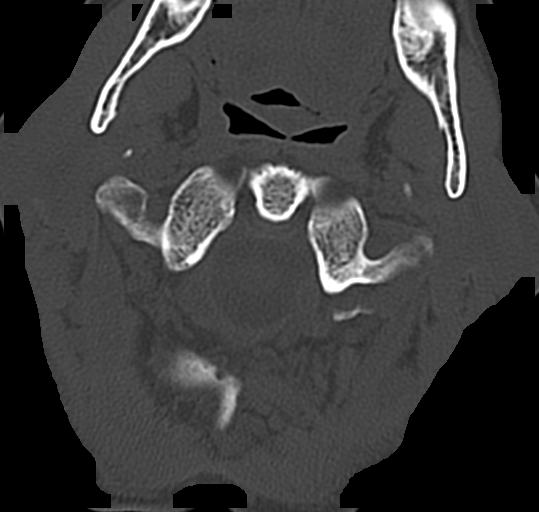
[im 108/124  bone]
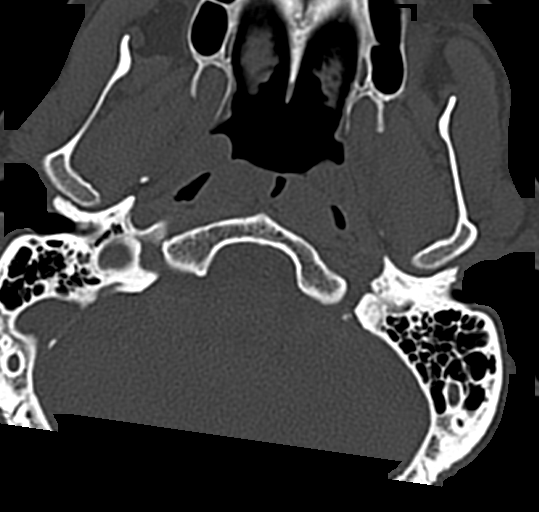

[15 of 47 positions shown; findings below may reference images not displayed]

FINDINGS: CT HEAD FINDINGS

Brain: No evidence of acute infarction, hemorrhage, hydrocephalus,
extra-axial collection or mass lesion/mass effect.

Vascular: No hyperdense vessel or unexpected calcification.

Skull: Normal. Negative for fracture or focal lesion.

Other: None.

CT MAXILLOFACIAL FINDINGS

Osseous: No fracture or mandibular dislocation. No destructive
process.

Orbits: Negative. No traumatic or inflammatory finding.

Sinuses: Clear.

Soft tissues: Negative.

CT CERVICAL SPINE FINDINGS

Alignment: Normal.

Skull base and vertebrae: No acute fracture. No primary bone lesion
or focal pathologic process.

Soft tissues and spinal canal: No prevertebral fluid or swelling. No
visible canal hematoma.

Disc levels: Minor cervical degenerative spondylosis at C5-6 and
C6-7. Preserved vertebral body heights. No acute osseous finding or
fracture. Facets are aligned.

Upper chest: Apical bullous disease noted.

Other: None.
IMPRESSION: Normal head CT without contrast.  No acute intracranial abnormality.

No acute facial bony trauma or fracture.

No acute cervical spine fracture or malalignment by CT. Minor
cervical spondylosis.

## 2018-02-04 ENCOUNTER — Telehealth: Payer: Self-pay | Admitting: Gastroenterology

## 2018-02-04 DIAGNOSIS — M2550 Pain in unspecified joint: Secondary | ICD-10-CM

## 2018-02-04 NOTE — Telephone Encounter (Signed)
Pt requesting refills of Tramadol. Last Rx was given by Dr Berle Mull #[id number]. Follow up scheduled for 03-18-2018. Please advise.

## 2018-02-04 NOTE — Telephone Encounter (Signed)
We can refill Tramadol for #30 pills. Please tell him he should avoid all ibuprofen and NSAIDs, he should not be taking any NSAIDs at all.  Thanks

## 2018-02-04 NOTE — Telephone Encounter (Signed)
Patient states that he has ran out of medication tramadol and the ibuprofen is not helping. Patient would like a refill of tramadol sent to [facility]. Pt has ov scheduled for 7.19.19.

## 2018-02-07 MED ORDER — TRAMADOL HCL 50 MG PO TABS: 50.0000 mg | ORAL_TABLET | Freq: Four times a day (QID) | ORAL | 0 refills | Status: DC | PRN

## 2018-02-07 NOTE — Telephone Encounter (Signed)
Patient has been advised that we will send Tramadol to pharmacy until his 03/18/18 appointment with Dr Havery Moros. However, he must avoid all NSAID's including ibuprofen as this could actually make his situation worse. Patient verbalizes understanding.

## 2018-02-09 DIAGNOSIS — K5090 Crohn's disease, unspecified, without complications: Secondary | ICD-10-CM | POA: Diagnosis not present

## 2018-03-09 DIAGNOSIS — K5090 Crohn's disease, unspecified, without complications: Secondary | ICD-10-CM | POA: Diagnosis not present

## 2018-03-18 ENCOUNTER — Other Ambulatory Visit: Payer: Medicare Other

## 2018-03-18 ENCOUNTER — Encounter: Payer: Self-pay | Admitting: Gastroenterology

## 2018-03-18 ENCOUNTER — Ambulatory Visit (INDEPENDENT_AMBULATORY_CARE_PROVIDER_SITE_OTHER)
Admission: RE | Admit: 2018-03-18 | Discharge: 2018-03-18 | Disposition: A | Discharge status: Home / Self Care | Payer: Medicare Other | Source: Ambulatory Visit | Attending: Gastroenterology | Admitting: Gastroenterology

## 2018-03-18 ENCOUNTER — Ambulatory Visit (INDEPENDENT_AMBULATORY_CARE_PROVIDER_SITE_OTHER): Payer: Medicare Other | Admitting: Gastroenterology

## 2018-03-18 VITALS — BP 102/54 | HR 75 | Ht 69.25 in | Wt 117.0 lb

## 2018-03-18 DIAGNOSIS — K50819 Crohn's disease of both small and large intestine with unspecified complications: Secondary | ICD-10-CM

## 2018-03-18 DIAGNOSIS — R109 Unspecified abdominal pain: Secondary | ICD-10-CM

## 2018-03-18 DIAGNOSIS — Z7952 Long term (current) use of systemic steroids: Secondary | ICD-10-CM

## 2018-03-18 MED ORDER — ONDANSETRON 4 MG PO TBDP: 4.0000 mg | ORAL_TABLET | Freq: Three times a day (TID) | ORAL | 3 refills | Status: DC | PRN

## 2018-03-18 MED ORDER — TRAMADOL HCL 50 MG PO TABS: 50.0000 mg | ORAL_TABLET | Freq: Four times a day (QID) | ORAL | 3 refills | Status: DC | PRN

## 2018-03-18 NOTE — Patient Instructions (Addendum)
If you are age 57 or younger, your body mass index should be between 19-25. Your Body mass index is 17.15 kg/m. If this is out of the aformentioned range listed, please consider follow up with your Primary Care Provider.   We have sent the following medications to your pharmacy for you to pick up at your convenience: Zofran Tramodol  Please go to the lab in the basement of our building to have a Done Density Scan done as you leave today.  Please go to the lab in the basement of our building to have lab work done as you leave today.   We will contact you about having a Entyvio trough level done on the day of but before your next infusion.  Thank you for entrusting me with your care and for choosing [facility], Dr. Malott Cellar

## 2018-03-18 NOTE — Progress Notes (Signed)
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 He has a colostomy with a remnant rectal pouch. He reports he has had a bowel surgery in the 1982, with a "few more surgeries" in years after while in [place], as well as in [facility], they tried reversing his colostomy which did not workwell for him, too much diarrhea and it was then reversed back. He has a history of abscess associated with small bowel.   He has been hospitalized multiple times in recent years, He had been on chronic prednisone for 10 years with fluctuating levels. HewasCimzia for about 4-5 years in the past. He has been on sulfasalazine for a long time. He reports an allergy to 6MP - he does not know the details ofthe reaction. He had an allergic reaction - hives, to both Remicade and Humira.Allergy to methotrexate, now on Entyvio.  SINCE LAST VISIT  Patient is here for follow-up visit. He had been doing really well on entyvio dosed every 4 weeks, however was hospitalized for a brief period of time in May for a flare of symptoms. At that time he had only mild inflammatory changes within a loop of ileum, however this was overall significantly improved from his prior CT scan in January. He had a fecal Cal protectant drawn at that time which was 53 ( borderline elevated). He was given some steroids and bowel rest and he improved quickly. He tapered off the steroids.  He states the steroids did help stimulate his appetite and his appetite has since decreased since being off them. He is lost a few pounds due to this and is trying to force himself to gain weight again. He is having some baseline mild abdominal discomfort, he thinks if he eats a low residual diet this is improved. His stools seem a bit looser than usual. Overall he states she continues to feel well on Entyvio compared to other regimens. He had his last  dose to about a week ago, do again in 3 weeks. He states he does feel better a few days after having Entyvio infused.  He had Entyvio levels obtained - he had a level of 5.4 without any antibodies drawn on 10/13/2017. Since then he has been on Entyvio every 4 weeks and thinks this has helped until recently. He was also given a trial of cholestyramine which did not significantly help any of his symptoms.  EGD 03/21/2009 - normal Colonoscopy 08/19/2010 - colo-colonic anastomosis at 60cm, active colitis and nonobstructive stricture at 70cm, ileocolonic stricture at 75cm Colonoscopy 08/2017 - normal colon, active ileitis   Past Medical History:  Diagnosis Date  . Acute endocarditis   . Crohn's disease ([place])   . Depression   . Eating disorder   . GERD (gastroesophageal reflux disease)   . Kidney stones   . Short gut syndrome      Past Surgical History:  Procedure Laterality Date  . COLONOSCOPY WITH PROPOFOL N/A 09/17/2017   Procedure: COLONOSCOPY WITH PROPOFOL;  Surgeon: [name], MD;  Location: [facility];  Service: Endoscopy;  Laterality: N/A;  Through ileostomy.  [name] WITH STENT PLACEMENT Left 05/20/2017   Procedure: CYSTOSCOPY WITH STENT PLACEMENT;  Surgeon: [name], MD;  Location: [facility];  Service: Urology;  Laterality: Left;  . ILEOSTOMY    . IR NEPHROSTOMY EXCHANGE LEFT  04/12/2017  . IR NEPHROSTOMY PLACEMENT LEFT  04/12/2017  . IR NEPHROSTOMY  PLACEMENT LEFT  04/29/2017  . NEPHROLITHOTOMY Left 05/14/2017   Procedure: LEFT NEPHROLITHOTOMY PERCUTANEOUS WITH SURGEON ACCESS;  Surgeon: [name], MD;  Location: [facility];  Service: Urology;  Laterality: Left;  . SMALL INTESTINE SURGERY    . SUBTOTAL COLECTOMY     Family History  Problem Relation Age of Onset  . Heart disease Mother   . Diabetes Father   . Heart disease Father   . Heart attack Father   . Melanoma Sister   . Crohn's disease Brother   . Inflammatory bowel disease Brother   .  Diabetes Paternal Grandmother    Social History   Tobacco Use  . Smoking status: Former Smoker    Packs/day: 1.00    Years: 20.00    Pack years: 20.00    Types: Cigarettes    Last attempt to quit: 08/31/2006    Years since quitting: 11.5  . Smokeless tobacco: Never Used  Substance Use Topics  . Alcohol use: No  . Drug use: No   Current Outpatient Medications  Medication Sig Dispense Refill  . acetaminophen (TYLENOL) 500 MG tablet Take 500 mg by mouth every 6 (six) hours as needed for moderate pain.    . cholestyramine light (PREVALITE) 4 g packet Take 1 packet (4 g total) by mouth daily. 30 packet 0  . ferrous sulfate 325 (65 FE) MG tablet TAKE 1 TABLET (325 MG TOTAL) 2 (TWO) TIMES DAILY WITH A MEAL BY MOUTH. 120 tablet 1  . ipratropium (ATROVENT) 0.03 % nasal spray Place 2 sprays 2 (two) times daily into both nostrils. Can use 3 times a day as needed 30 mL 2  . Multiple Vitamin (MULTIVITAMIN WITH MINERALS) TABS tablet Take 1 tablet by mouth daily. 30 tablet 3  . ondansetron (ZOFRAN-ODT) 4 MG disintegrating tablet Take 1 tablet (4 mg total) by mouth every 8 (eight) hours as needed for nausea or vomiting. 30 tablet 0  . potassium chloride SA (KLOR-CON M20) 20 MEQ tablet Take 1 tablet by mouth daily 30 tablet 1  . ranitidine (ZANTAC) 150 MG tablet Take 1 tablet (150 mg total) by mouth 2 (two) times daily. (Patient taking differently: Take 150 mg by mouth 2 (two) times daily as needed for heartburn. ) 60 tablet 6  . sertraline (ZOLOFT) 50 MG tablet Take 1 tablet (50 mg total) by mouth daily. 90 tablet 1  . Vitamin D, Ergocalciferol, 2000 units CAPS Take 1 capsule by mouth daily.    . traMADol (ULTRAM) 50 MG tablet Take 1 tablet (50 mg total) by mouth every 6 (six) hours as needed. (Patient not taking: Reported on 03/18/2018) 30 tablet 0   No current facility-administered medications for this visit.    Allergies  Allergen Reactions  . Methotrexate Derivatives Other (See Comments)     Anemia, low WBC, severe GI symptoms,   . Phenergan [Promethazine Hcl] Other (See Comments)    twitching  . Humira [Adalimumab] Other (See Comments)    Intolerance  . Penicillins Other (See Comments)    REACTION DURING SURGERY Has patient had a PCN reaction causing immediate rash, facial/tongue/throat swelling, SOB or lightheadedness with hypotension: Unknown Has patient had a PCN reaction causing severe rash involving mucus membranes or skin necrosis: Unknown Has patient had a PCN reaction that required hospitalization: Unknown Has patient had a PCN reaction occurring within the last 10 years: No If all of the above answers are "NO", then may proceed with Cephalosporin use.   . Remicade [Infliximab] Other (See  Comments)    Intolerance     Review of Systems: All systems reviewed and negative except where noted in HPI.   Lab Results  Component Value Date   WBC 8.2 01/19/2018   HGB 10.8 (L) 01/19/2018   HCT 33.6 (L) 01/19/2018   MCV 91.6 01/19/2018   PLT 151 01/19/2018    Lab Results  Component Value Date   CREATININE 1.13 01/18/2018   BUN 10 01/18/2018   NA 139 01/18/2018   K 4.0 01/18/2018   CL 111 01/18/2018   CO2 21 (L) 01/18/2018   Lab Results  Component Value Date   ALT 15 (L) 01/17/2018   AST 21 01/17/2018   ALKPHOS 71 01/17/2018   BILITOT 0.9 01/17/2018     Physical Exam: BP (!) 102/54   Pulse 75   Ht 5' 9.25" (1.759 m)   Wt 117 lb (53.1 kg)   BMI 17.15 kg/m  Constitutional: Pleasant, male in no acute distress. HEENT: Normocephalic and atraumatic. Conjunctivae are normal. No scleral icterus. Neck supple.  Cardiovascular: Normal rate, regular rhythm.  Pulmonary/chest: Effort normal and breath sounds normal. No wheezing, rales or rhonchi. Abdominal: Soft, nondistended, mild mid abdominal TTP. Colostomy in LLQ. There are no masses palpable. No hepatomegaly. Extremities: no edema Lymphadenopathy: No cervical adenopathy noted. Neurological: Alert and  oriented to person place and time. Skin: Skin is warm and dry. No rashes noted. Psychiatric: Normal mood and affect. Behavior is normal.   ASSESSMENT AND PLAN: 57 year old male here for reassessment of the following issues:  Severe penetrating/stricturing ileocolonic Crohn's disease - history of multiple small bowel resections and associated short gut syndrome.See history as above - intolerant to multipleanti-TNFsand 6-MP/methotrexate.On Entyvio monotherapy which has helped him since initially started last year,butadmitted for flare of disease in January. Found to have subtherapeutic Entyvio levels at that time, now on increased frequency of dosing every 4 weeks. He had been doing much better but then relapsed again and hospitalized in May. Interestingly his CT scan looked significantly improved at that time, minimal inflammatory changes, and his fecal calprotectin was only borderline elevated. It's possible his symptoms could have been due to fibrotic stricturing, although he did feel better with some prednisone. Overall he stable at this time and does feel that Weyman Rodney has provided significant improvement since his dose increase.  Recommend at this time: - continue Entyvio dosing every 4 weeks - will recheck Entyvio trough level / antibody at next infusion in 3 weeks - recheck fecal calprotectin - obtain labs from when checked on 7/1 (Dr. Buck Mam sp?) to ensure stable - refill tramadol and zofran - continue vitamin D supplementation - DEXA scan - rule out osteoporosis / penia given his history of steroid use - if patient feels he is relapsing into another flare, will call for prednisone. Will consider repeat colonoscopy in the next few months to assess mucosa pending his course. If he continues to have active inflammation / symptoms despite high dose Entyvio, will change regimen and consider Stelara.  Follow up in 3 months otherwise  Iraan Cellar, MD [facility]

## 2018-03-21 ENCOUNTER — Other Ambulatory Visit: Payer: Self-pay

## 2018-03-21 DIAGNOSIS — K50819 Crohn's disease of both small and large intestine with unspecified complications: Secondary | ICD-10-CM

## 2018-03-21 NOTE — Progress Notes (Signed)
Entyvio Trough level to be drawn on April 06, 2018

## 2018-03-24 ENCOUNTER — Encounter: Payer: Self-pay | Admitting: Family Medicine

## 2018-04-04 ENCOUNTER — Other Ambulatory Visit: Payer: Self-pay

## 2018-04-04 DIAGNOSIS — K50819 Crohn's disease of both small and large intestine with unspecified complications: Secondary | ICD-10-CM

## 2018-04-04 NOTE — Progress Notes (Signed)
Put in new order for [facility] lab test. No longer using In[employer].  Now using [employer]. New kit completed and ready for pt pickup on Wednesday, 04-06-18 prior to going to lab. Needs ice pack.

## 2018-04-06 ENCOUNTER — Other Ambulatory Visit: Payer: Medicare Other

## 2018-04-06 DIAGNOSIS — R109 Unspecified abdominal pain: Secondary | ICD-10-CM | POA: Diagnosis not present

## 2018-04-06 DIAGNOSIS — K50819 Crohn's disease of both small and large intestine with unspecified complications: Secondary | ICD-10-CM | POA: Diagnosis not present

## 2018-04-06 DIAGNOSIS — K5090 Crohn's disease, unspecified, without complications: Secondary | ICD-10-CM | POA: Diagnosis not present

## 2018-04-11 LAB — CALPROTECTIN, FECAL: Calprotectin, Fecal: 130 ug/g — ABNORMAL HIGH (ref 0–120)

## 2018-05-03 ENCOUNTER — Telehealth: Payer: Self-pay | Admitting: Gastroenterology

## 2018-05-04 ENCOUNTER — Other Ambulatory Visit: Payer: Self-pay

## 2018-05-04 DIAGNOSIS — K5090 Crohn's disease, unspecified, without complications: Secondary | ICD-10-CM | POA: Diagnosis not present

## 2018-05-04 MED ORDER — PREDNISONE 5 MG PO TABS: ORAL_TABLET | ORAL | 0 refills | Status: DC

## 2018-05-04 NOTE — Telephone Encounter (Signed)
Left message for patient to call back  

## 2018-05-04 NOTE — Telephone Encounter (Signed)
Pt is returning your call

## 2018-05-04 NOTE — Telephone Encounter (Signed)
Patient is due for his infusion today, he also has been having some abdominal pain. He is anxious as he is heading out to see his daughter in [place] for a week or so. He is afraid he will have a flare up while out there and is requesting a script of prednisone/antibiotic. He has a refill on his Tramadol so he will contact his pharmacy for that. Please advise, thank you.

## 2018-05-04 NOTE — Telephone Encounter (Signed)
Thanks for the note Gregory [name]. He is now on high dose Entyvio with therapeutic levels. He has an appointment to see me in the upcoming weeks, at which time I will recommend a colonoscopy to see how his bowel has responded to escalation in therapy. Until then, if traveling, if he thinks he is having a flare, we can give him prednisone. If he needs to use it, recommend prednisone 6m / day for 2 weeks, with slow taper by 566m/ week. Assuming he is only gone 1-2 weeks, we can give him the 4041mrednisone and if he uses it he should call when he gets back in town to discuss how to taper. Thanks

## 2018-05-04 NOTE — Telephone Encounter (Signed)
Left detailed message for patient that will send in Rx for prednisone to take only if he needs this while visiting his daughter. He is to contact office once he is back to get instructions on tapering. Rx does have taper directions, he also needs to keep his appointment on 10/3 to discuss setting up colonoscopy.

## 2018-05-05 ENCOUNTER — Other Ambulatory Visit: Payer: Self-pay

## 2018-05-05 ENCOUNTER — Telehealth: Payer: Self-pay | Admitting: Gastroenterology

## 2018-05-05 MED ORDER — POTASSIUM CHLORIDE CRYS ER 20 MEQ PO TBCR: EXTENDED_RELEASE_TABLET | ORAL | 0 refills | Status: DC

## 2018-05-05 NOTE — Telephone Encounter (Signed)
Called and spoke to pt. He will discuss with Dr. Volanda Napoleon (PCP) and Dr. Havery Moros at his appt next month with each.  Until then refilled for 30 days so he has enough for his trip.

## 2018-05-05 NOTE — Telephone Encounter (Signed)
Routed to Jan.

## 2018-05-12 ENCOUNTER — Other Ambulatory Visit: Payer: Self-pay | Admitting: Gastroenterology

## 2018-05-12 DIAGNOSIS — R109 Unspecified abdominal pain: Secondary | ICD-10-CM

## 2018-05-12 NOTE — Telephone Encounter (Signed)
Refill request for tramadol 50 mg one q6hrs prn . Pt has a follow up on 06-02-2018.

## 2018-05-15 NOTE — Telephone Encounter (Signed)
Yes we can refill this to take one every 8 hours PRN for breakthrough discomfort. I will see him for an office visit in October. Thanks

## 2018-06-01 DIAGNOSIS — Z23 Encounter for immunization: Secondary | ICD-10-CM | POA: Diagnosis not present

## 2018-06-01 DIAGNOSIS — K5090 Crohn's disease, unspecified, without complications: Secondary | ICD-10-CM | POA: Diagnosis not present

## 2018-06-02 ENCOUNTER — Ambulatory Visit (INDEPENDENT_AMBULATORY_CARE_PROVIDER_SITE_OTHER): Payer: Medicare Other | Admitting: Gastroenterology

## 2018-06-02 ENCOUNTER — Encounter: Payer: Self-pay | Admitting: Gastroenterology

## 2018-06-02 VITALS — BP 90/66 | HR 72 | Ht 69.25 in | Wt 120.2 lb

## 2018-06-02 DIAGNOSIS — K50019 Crohn's disease of small intestine with unspecified complications: Secondary | ICD-10-CM | POA: Diagnosis not present

## 2018-06-02 MED ORDER — SUPREP BOWEL PREP KIT 17.5-3.13-1.6 GM/177ML PO SOLN: ORAL | 0 refills | Status: DC

## 2018-06-02 NOTE — Progress Notes (Signed)
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 He has acolostomywith a remnant rectal pouch. He reports he has had a bowel surgery in the 1982, with a "few more surgeries" in years after while in [place], as well as in [facility], they tried reversing hiscolostomywhich did not workwell for him, too much diarrhea and it was then reversed back. He has a history of abscess associated with small bowel.   He has been hospitalized multiple times in recent years, He hadbeen on chronic prednisone for 10 years with fluctuating levels. HewasCimzia for about 4-5 years in the past. He has been on sulfasalazine for a long time. He reports an allergy to 6MP - he does not know the details ofthe reaction. He had an allergic reaction - hives, to both Remicade and Humira.Allergy to methotrexate, now on Entyvio.   SINCE LAST VISIT  Patient is here for follow-up visit. He had previously been doing very well on to be adjusted every few weeks however had a flare of symptoms and previously had an Entyvio level of 5.4 without antibodies in February. His dose was increased to every 8 weeks he reported improvement in symptoms. Unfortunately he had a hospitalization for a brief period of time and may. At that time he had only mild inflammatory changes within the loop of the ileum however this was improved from his prior CT in January. Further he had a fecal Calprotectin of 53 at the time which is borderline elevated. He responded to steroids and quite quickly improved.  Since his last visit, now that he is been on Entyvio dosed at every 4 weeks for the past several months, we repeated his Entyvio levels given some periodic symptoms, and his level is increased to 34.1 without antibodies. A repeat fecal calprotectin however was 130. He was traveling out of state and I gave him some prednisone to take with  some and he used this and tapered down over the past few weeks, he states this did make him feel better in regards to abdominal pain in the stools. He is currently on prednisone 102m / day with plans to taper down and go off soon. He had his last Entyvio infusion yesterday, he seems to tolerate it well.   At this point is feeling pretty well in general. His weight is stable in the 120s which has been good for him. He has some mild intermittent pains at times but nothing significant. He is taking Tylenol as needed for the most part. He has anywhere from 3-4 bowel movements per day, he denies any blood in his stools, he is not having much urgency. He is a well he denies any postprandial pain or vomiting. He had flu shot yesterday.  EGD 03/21/2009 - normal Colonoscopy 08/19/2010 - colo-colonic anastomosis at 60cm, active colitis and nonobstructive stricture at 70cm, ileocolonic stricture at 75cm Colonoscopy 08/2017 - normal colon, active ileitis   Past Medical History:  Diagnosis Date  . Acute endocarditis   [place]   . Depression   . Eating disorder   . GERD (gastroesophageal reflux disease)   . Kidney stones   . Short gut syndrome      Past Surgical History:  Procedure Laterality Date  . COLONOSCOPY WITH PROPOFOL N/A 09/17/2017   Procedure: COLONOSCOPY WITH PROPOFOL;  Surgeon: NMauri Pole MD;  [facility];  Service: Endoscopy;  Laterality: N/A;  Through ileostomy.  .Marland Kitchen  CYSTOSCOPY WITH STENT PLACEMENT Left 05/20/2017   Procedure: CYSTOSCOPY WITH STENT PLACEMENT;  Surgeon: [name], MD;  Location: [facility];  Service: Urology;  Laterality: Left;  . ILEOSTOMY    . IR NEPHROSTOMY EXCHANGE LEFT  04/12/2017  . IR NEPHROSTOMY PLACEMENT LEFT  04/12/2017  . IR NEPHROSTOMY PLACEMENT LEFT  04/29/2017  . NEPHROLITHOTOMY Left 05/14/2017   Procedure: LEFT NEPHROLITHOTOMY PERCUTANEOUS WITH SURGEON ACCESS;  Surgeon: [name], MD;  Location: [facility];   Service: Urology;  Laterality: Left;  . SMALL INTESTINE SURGERY    . SUBTOTAL COLECTOMY     Family History  Problem Relation Age of Onset  . Heart disease Mother   . Diabetes Father   . Heart disease Father   . Heart attack Father   . Melanoma Sister   . Crohn's disease Brother   . Inflammatory bowel disease Brother   . Diabetes Paternal Grandmother    Social History   Tobacco Use  . Smoking status: Former Smoker    Packs/day: 1.00    Years: 20.00    Pack years: 20.00    Types: Cigarettes    Last attempt to quit: 08/31/2006    Years since quitting: 11.7  . Smokeless tobacco: Never Used  Substance Use Topics  . Alcohol use: No  . Drug use: No   Current Outpatient Medications  Medication Sig Dispense Refill  . acetaminophen (TYLENOL) 500 MG tablet Take 500 mg by mouth every 6 (six) hours as needed for moderate pain.    . cholestyramine light (PREVALITE) 4 g packet Take 1 packet (4 g total) by mouth daily. 30 packet 0  . ferrous sulfate 325 (65 FE) MG tablet TAKE 1 TABLET (325 MG TOTAL) 2 (TWO) TIMES DAILY WITH A MEAL BY MOUTH. 120 tablet 1  . ipratropium (ATROVENT) 0.03 % nasal spray Place 2 sprays 2 (two) times daily into both nostrils. Can use 3 times a day as needed 30 mL 2  . Multiple Vitamin (MULTIVITAMIN WITH MINERALS) TABS tablet Take 1 tablet by mouth daily. 30 tablet 3  . ondansetron (ZOFRAN-ODT) 4 MG disintegrating tablet Take 1 tablet (4 mg total) by mouth every 8 (eight) hours as needed for nausea or vomiting. 30 tablet 3  . potassium chloride SA (KLOR-CON M20) 20 MEQ tablet Take 1 tablet by mouth daily 30 tablet 0  . predniSONE (DELTASONE) 5 MG tablet Take 40 mg (8 tabs) by mouth daily for 14 days, then decrease by 5 mg per week until gone. 308 tablet 0  . ranitidine (ZANTAC) 150 MG tablet Take 1 tablet (150 mg total) by mouth 2 (two) times daily. (Patient taking differently: Take 150 mg by mouth 2 (two) times daily as needed for heartburn. ) 60 tablet 6  .  sertraline (ZOLOFT) 50 MG tablet Take 1 tablet (50 mg total) by mouth daily. 90 tablet 1  . traMADol (ULTRAM) 50 MG tablet TAKE ONE TABLET BY MOUTH EVERY 6 HOURS AS NEEDED 30 tablet 2  . Vitamin D, Ergocalciferol, 2000 units CAPS Take 1 capsule by mouth daily.     No current facility-administered medications for this visit.    Allergies  Allergen Reactions  . Methotrexate Derivatives Other (See Comments)    Anemia, low WBC, severe GI symptoms,   . Phenergan [Promethazine Hcl] Other (See Comments)    twitching  . Humira [Adalimumab] Other (See Comments)    Intolerance  . Penicillins Other (See Comments)    REACTION DURING SURGERY Has patient had  a PCN reaction causing immediate rash, facial/tongue/throat swelling, SOB or lightheadedness with hypotension: Unknown Has patient had a PCN reaction causing severe rash involving mucus membranes or skin necrosis: Unknown Has patient had a PCN reaction that required hospitalization: Unknown Has patient had a PCN reaction occurring within the last 10 years: No If all of the above answers are "NO", then may proceed with Cephalosporin use.   . Remicade [Infliximab] Other (See Comments)    Intolerance     Review of Systems: All systems reviewed and negative except where noted in HPI.   Lab Results  Component Value Date   WBC 8.2 01/19/2018   HGB 10.8 (L) 01/19/2018   HCT 33.6 (L) 01/19/2018   MCV 91.6 01/19/2018   PLT 151 01/19/2018    Lab Results  Component Value Date   CREATININE 1.13 01/18/2018   BUN 10 01/18/2018   NA 139 01/18/2018   K 4.0 01/18/2018   CL 111 01/18/2018   CO2 21 (L) 01/18/2018    Lab Results  Component Value Date   ALT 15 (L) 01/17/2018   AST 21 01/17/2018   ALKPHOS 71 01/17/2018   BILITOT 0.9 01/17/2018     Physical Exam: BP 90/66 (BP Location: Left Arm, Patient Position: Sitting, Cuff Size: Normal)   Pulse 72 Comment: irregular  Ht 5' 9.25" (1.759 m) Comment: height measured without shoes  Wt  120 lb 4 oz (54.5 kg)   BMI 17.63 kg/m  Constitutional: Pleasant, male in no acute distress. HEENT: Normocephalic and atraumatic. Conjunctivae are normal. No scleral icterus. Neck supple.  Cardiovascular: Normal rate, regular rhythm.  Pulmonary/chest: Effort normal and breath sounds normal. No wheezing, rales or rhonchi. Abdominal: Soft, nondistended, nontender. Colostomy LLQ/ There are no masses palpable. No hepatomegaly. Extremities: no edema Lymphadenopathy: No cervical adenopathy noted. Neurological: Alert and oriented to person place and time. Skin: Skin is warm and dry. No rashes noted. Psychiatric: Normal mood and affect. Behavior is normal.   ASSESSMENT AND PLAN: 57 year old male here for reassessment following issues:  Severe penetrating/stricturing ileocolonic Crohn's disease - history of multiple small bowel resections and associated short gut syndrome.See history as above - intolerant to multipleanti-TNFsand 6-MP/methotrexate.On Entyvio monotherapy which has helped him since initially started last year,butadmitted for flare of diseasein January. Found to have subtherapeutic Entyvio levels at that time, now on increased frequency of dosing every 4 weeks. He had been doing much better but then relapsed again and hospitalized in May. Interestingly his CT scan looked significantly improved at that time, minimal inflammatory changes, and his fecal calprotectin was only borderline elevated. Repeat Entyvio levels recently show the value is > 30 without antibodies. He had a prednisone taper while traveling recently to ensure he felt well, now tapering off. Generally he's feeling well now, although last calprotectin was more elevated. At this point I'm concerned Entyvio monotherapy may not be controlling his disease to well. Given he is been on every 4 week dosing with adequate trough levels at this point, I'm recommending a colonoscopy to further evaluate his mucosa. If he is in  remission we'll continue the regimen. If he has active inflammation, light of his intermittent symptoms, we will need to switch his regimen and likely proceed with Stelara in that setting.   Recommend at this time: - continue Entyvio at present dosing - continue to taper off prednisone in the next 1-2 weeks - plan for surveillance colonoscopy in the next few weeks. If active inflammation noted on this regimen, will plan on  transitioning him to Stelara. If it looks good, will continue his course. Overall he has had improvement with Entyvio and has minimized his flares, although I'm suspect he is not in remission. Following discussion of risks and benefits colonoscopy he wanted to proceed.  Fountain Cellar, MD [facility]

## 2018-06-02 NOTE — Patient Instructions (Signed)
If you are age 57 or older, your body mass index should be between 23-30. Your Body mass index is 17.63 kg/m. If this is out of the aforementioned range listed, please consider follow up with your Primary Care Provider.  If you are age 30 or younger, your body mass index should be between 19-25. Your Body mass index is 17.63 kg/m. If this is out of the aformentioned range listed, please consider follow up with your Primary Care Provider.   You have been scheduled for a colonoscopy. Please follow written instructions given to you at your visit today.  Please pick up your prep supplies at the pharmacy within the next 1-3 days. If you use inhalers (even only as needed), please bring them with you on the day of your procedure. Your physician has requested that you go to www.startemmi.com and enter the access code given to you at your visit today. This web site gives a general overview about your procedure. However, you should still follow specific instructions given to you by our office regarding your preparation for the procedure.  Thank you for entrusting me with your care and for choosing [facility], Dr. Emmet Cellar

## 2018-06-08 ENCOUNTER — Telehealth: Payer: Self-pay | Admitting: Gastroenterology

## 2018-06-08 NOTE — Telephone Encounter (Signed)
Pt states that copay for suprep is over $100 and he cannot afford it. He is requesting another prep.

## 2018-06-08 NOTE — Telephone Encounter (Signed)
Called pt.  Will leave sample of Suprep up front for him to pick up.  He was Very Patent attorney.

## 2018-06-08 NOTE — Telephone Encounter (Signed)
Routed to Jan.

## 2018-06-29 DIAGNOSIS — K5090 Crohn's disease, unspecified, without complications: Secondary | ICD-10-CM | POA: Diagnosis not present

## 2018-06-29 DIAGNOSIS — R5383 Other fatigue: Secondary | ICD-10-CM | POA: Diagnosis not present

## 2018-06-29 DIAGNOSIS — Z79899 Other long term (current) drug therapy: Secondary | ICD-10-CM | POA: Diagnosis not present

## 2018-07-01 ENCOUNTER — Telehealth: Payer: Self-pay | Admitting: Gastroenterology

## 2018-07-05 ENCOUNTER — Ambulatory Visit (AMBULATORY_SURGERY_CENTER): Payer: Medicare Other | Admitting: Gastroenterology

## 2018-07-05 ENCOUNTER — Encounter: Payer: Self-pay | Admitting: Gastroenterology

## 2018-07-05 VITALS — BP 94/64 | HR 81 | Temp 98.7°F | Resp 12 | Ht 69.25 in | Wt 120.0 lb

## 2018-07-05 DIAGNOSIS — K529 Noninfective gastroenteritis and colitis, unspecified: Secondary | ICD-10-CM

## 2018-07-05 DIAGNOSIS — K50019 Crohn's disease of small intestine with unspecified complications: Secondary | ICD-10-CM

## 2018-07-05 DIAGNOSIS — K219 Gastro-esophageal reflux disease without esophagitis: Secondary | ICD-10-CM | POA: Diagnosis not present

## 2018-07-05 DIAGNOSIS — K5090 Crohn's disease, unspecified, without complications: Secondary | ICD-10-CM | POA: Diagnosis not present

## 2018-07-05 HISTORY — PX: COLONOSCOPY: SHX174

## 2018-07-05 MED ORDER — SODIUM CHLORIDE 0.9 % IV SOLN
500.0000 mL | Freq: Once | INTRAVENOUS | Status: DC
Start: 2018-07-05 — End: 2018-07-05

## 2018-07-05 NOTE — Op Note (Signed)
[place] Patient Name: Gregory [name] Procedure Date: 07/05/2018 3:39 PM MRN: [id number] Endoscopist: Remo Lipps P. Havery Moros , MD Age: 57 Referring MD:  Date of Birth: [birth year] Gender: Male Account #: [id number] Procedure:                Colonoscopy Indications:              Follow-up of Crohn's disease of the small bowel -                            on high dose Entyvio with levels > 30 without                            antibodies. Fecal calprotectin was 130, now post                            treatment with prednisone for suspected flare.                            Patient feeling improved. Medicines:                Monitored Anesthesia Care Procedure:                Pre-Anesthesia Assessment:                           - Prior to the procedure, a History and Physical                            was performed, and patient medications and                            allergies were reviewed. The patient's tolerance of                            previous anesthesia was also reviewed. The risks                            and benefits of the procedure and the sedation                            options and risks were discussed with the patient.                            All questions were answered, and informed consent                            was obtained. Prior Anticoagulants: The patient has                            taken no previous anticoagulant or antiplatelet                            agents. ASA Grade Assessment: II - A patient with  mild systemic disease. After reviewing the risks                            and benefits, the patient was deemed in                            satisfactory condition to undergo the procedure.                           After obtaining informed consent, the colonoscope                            was passed under direct vision. Throughout the                            procedure, the patient's blood pressure,  pulse, and                            oxygen saturations were monitored continuously. The                            Model PCF-[id number]) scope was introduced                            through the sigmoid colostomy and advanced to the                            the terminal ileum. The colonoscopy was performed                            without difficulty. The patient tolerated the                            procedure well. The quality of the bowel                            preparation was poor. The terminal ileum was                            photographed. Scope In: 4:01:39 PM Scope Out: 4:10:15 PM Scope Withdrawal Time: 0 hours 6 minutes 11 seconds  Total Procedure Duration: 0 hours 8 minutes 36 seconds  Findings:                 Diffuse inflammation, graded as Rutgeerts Score i3                            (diffuse aphthous ileitis) and characterized by                            erythema and aphthous ulcerations was found in the                            terminal ileum and at the surgical anastomosis                            (  located in the right colon).                           The colonic mucosa appeared normal without any                            obvious inflammatory changes, however prep was poor                            in certain portion of the colon and was inadequate                            for screening purposes to rule out polyps, etc. The                            ostomy was normal.. Complications:            No immediate complications. Estimated blood loss:                            None. Estimated Blood Loss:     Estimated blood loss: none. Impression:               - Preparation of the colon was poor.                           - Active Crohn's disease at the surgical                            anastomosis and ileum.                           - No colonic inflammation                           Overall, the patient is failing high dose Entyvio.                             Recommend we stop Entyvio and look into other                            options (Stelara) Recommendation:           - Patient has a contact number available for                            emergencies. The signs and symptoms of potential                            delayed complications were discussed with the                            patient. Return to normal activities tomorrow.                            Written discharge instructions were provided to the  patient.                           - Resume previous diet.                           - Continue present medications.                           - Stop Entyvio                           - Increase prednisone back to 20 or 62m, will                            discuss options with patient                           - Will look into insurance coverage for [place] Shona Pardo, MD 07/05/2018 4:19:15 PM This report has been signed electronically.

## 2018-07-05 NOTE — Progress Notes (Signed)
Report given to PACU, vss 

## 2018-07-05 NOTE — Patient Instructions (Signed)
YOU HAD AN ENDOSCOPIC PROCEDURE TODAY AT [facility]:   Refer to the procedure report that was given to you for any specific questions about what was found during the examination.  If the procedure report does not answer your questions, please call your gastroenterologist to clarify.  If you requested that your care partner not be given the details of your procedure findings, then the procedure report has been included in a sealed envelope for you to review at your convenience later.  YOU SHOULD EXPECT: Some feelings of bloating in the abdomen. Passage of more gas than usual.  Walking can help get rid of the air that was put into your GI tract during the procedure and reduce the bloating. If you had a lower endoscopy (such as a colonoscopy or flexible sigmoidoscopy) you may notice spotting of blood in your stool or on the toilet paper. If you underwent a bowel prep for your procedure, you may not have a normal bowel movement for a few days.  Please Note:  You might notice some irritation and congestion in your nose or some drainage.  This is from the oxygen used during your procedure.  There is no need for concern and it should clear up in a day or so.  SYMPTOMS TO REPORT IMMEDIATELY:   Following lower endoscopy (colonoscopy or flexible sigmoidoscopy):  Excessive amounts of blood in the stool  Significant tenderness or worsening of abdominal pains  Swelling of the abdomen that is new, acute  Fever of 100F or higher  For urgent or emergent issues, a gastroenterologist can be reached at any hour by calling [phone].   DIET:  We do recommend a small meal at first, but then you may proceed to your regular diet.  Drink plenty of fluids but you should avoid alcoholic beverages for 24 hours.  ACTIVITY:  You should plan to take it easy for the rest of today and you should NOT DRIVE or use heavy machinery until tomorrow (because of the sedation medicines used during the test).     FOLLOW UP: Our staff will call the number listed on your records the next business day following your procedure to check on you and address any questions or concerns that you may have regarding the information given to you following your procedure. If we do not reach you, we will leave a message.  However, if you are feeling well and you are not experiencing any problems, there is no need to return our call.  We will assume that you have returned to your regular daily activities without incident.  If any biopsies were taken you will be contacted by phone or by letter within the next 1-3 weeks.  Please call us at [phone] if you have not heard about the biopsies in 3 weeks.    SIGNATURES/CONFIDENTIALITY: You and/or your care partner have signed paperwork which will be entered into your electronic medical record.  These signatures attest to the fact that that the information above on your After Visit Summary has been reviewed and is understood.  Full responsibility of the confidentiality of this discharge information lies with you and/or your care-partner.

## 2018-07-06 ENCOUNTER — Telehealth: Payer: Self-pay | Admitting: *Deleted

## 2018-07-06 NOTE — Telephone Encounter (Signed)
This is documentation that patient completes/signs and sends in to [employer]. Dr. Havery Moros is now changing his mind on therapy and wants patient to start Stelara.

## 2018-07-06 NOTE — Telephone Encounter (Signed)
  Follow up Call-  Call back number 07/05/2018  Post procedure Call Back phone  # [phone]  Permission to leave phone message Yes     Patient questions:  Do you have a fever, pain , or abdominal swelling? No. Pain Score  0 *  Have you tolerated food without any problems? Yes.    Have you been able to return to your normal activities? Yes.    Do you have any questions about your discharge instructions: Diet   No. Medications  No. Follow up visit  No.  Do you have questions or concerns about your Care? No.  Actions: * If pain score is 4 or above: No action needed, pain <4.

## 2018-07-07 ENCOUNTER — Other Ambulatory Visit: Payer: Self-pay

## 2018-07-07 NOTE — Telephone Encounter (Signed)
Patty I don't think I have been prescribing the patient's K. His PCP can manage that. We can refill prednisone for 67m / day - please tell him to take for 2 weeks, then 166m/ day for 2 weeks, then 1040m day for 2 weeks, and then 5mg78mday for 2 weeks. We are looking into getting approval for Stelara, will see if he can start that in a few weeks. thanks

## 2018-07-08 ENCOUNTER — Other Ambulatory Visit: Payer: Self-pay

## 2018-07-08 MED ORDER — DIPHENOXYLATE-ATROPINE 2.5-0.025 MG PO TABS: 1.0000 | ORAL_TABLET | Freq: Four times a day (QID) | ORAL | 1 refills | Status: DC | PRN

## 2018-07-08 MED ORDER — PREDNISONE 5 MG PO TABS: ORAL_TABLET | ORAL | 0 refills | Status: DC

## 2018-07-15 ENCOUNTER — Other Ambulatory Visit: Payer: Self-pay

## 2018-07-18 ENCOUNTER — Telehealth: Payer: Self-pay

## 2018-07-18 ENCOUNTER — Other Ambulatory Visit: Payer: Self-pay

## 2018-07-18 MED ORDER — USTEKINUMAB 130 MG/26ML IV SOLN: 390.0000 mg | Freq: Once | INTRAVENOUS | 0 refills | Status: AC

## 2018-07-18 MED ORDER — USTEKINUMAB 90 MG/ML ~~LOC~~ SOSY: 90.0000 mg | PREFILLED_SYRINGE | SUBCUTANEOUS | 6 refills | Status: DC

## 2018-07-18 NOTE — Telephone Encounter (Signed)
-----   Message from Yetta Flock, MD sent at 07/18/2018  7:26 AM EST ----- Regarding: RE: Delsa Grana Thanks Almyra Free,  Given it has been over a month since we got his last weight, I would ask him to weigh himself at home and get an up to date weight on his home scale. If > 55 Kg then dose with 3100m for the first dose, if < 55 Kg then it is 2653m Thanks  ----- Message ----- From: ShWyatt HasteRN Sent: 07/15/2018   3:29 PM EST To: StYetta FlockMD Subject: RE: StDelsa Grana                                  This patient is right on the edge for dosing, what would like to order for the Stelara IV induction? Thanks, JuAlmyra Free---- Message ----- From: ArYetta FlockMD Sent: 07/05/2018   4:20 PM EST To: JuWyatt HasteRN Subject: StHadley PenUnfortunately Mr. Corea had a colonoscopy showing active Crohn's in the small bowel despite high dose Entyvio. [facility]. Can you look into this and see how well his insurance would cover the Stelara? Thanks much

## 2018-07-18 NOTE — Telephone Encounter (Signed)
-----   Message from Yetta Flock, MD sent at 07/05/2018  4:20 PM EST ----- Regarding: Hadley Pen, Unfortunately Mr. Salsgiver had a colonoscopy showing active Crohn's in the small bowel despite high dose Entyvio. I think we need to stop the [facility] and look into starting Stelara. Can you look into this and see how well his insurance would cover the Stelara? Thanks much

## 2018-07-18 NOTE — Telephone Encounter (Signed)
Gregory [name], his current weight is 125lbs

## 2018-07-18 NOTE — Telephone Encounter (Signed)
Called and lvm for patient to get a current weight and let us know so that an order can be sent over to [facility]. I am sure they will get a weight on him when he has the IV Stelara induction.

## 2018-07-18 NOTE — Telephone Encounter (Signed)
See other phone note, sent info to [facility].

## 2018-07-18 NOTE — Telephone Encounter (Signed)
Patient's weight is currently 125 lbs or 56.8 kg. Faxed over request to [facility] for patient to start Stelara 390 mg IV dose for induction then 90 mg subcutaneous every 8 weeks there after. He will need to stop the [facility] which he currently was receiving there.

## 2018-07-20 ENCOUNTER — Other Ambulatory Visit: Payer: Self-pay | Admitting: Gastroenterology

## 2018-07-20 DIAGNOSIS — R109 Unspecified abdominal pain: Secondary | ICD-10-CM

## 2018-07-20 NOTE — Telephone Encounter (Signed)
Yes okay to refill for 30 days. Thanks

## 2018-07-20 NOTE — Telephone Encounter (Signed)
Ok to refill 

## 2018-07-27 DIAGNOSIS — K5090 Crohn's disease, unspecified, without complications: Secondary | ICD-10-CM | POA: Diagnosis not present

## 2018-07-27 DIAGNOSIS — Z79899 Other long term (current) drug therapy: Secondary | ICD-10-CM | POA: Diagnosis not present

## 2018-08-20 ENCOUNTER — Other Ambulatory Visit: Payer: Self-pay | Admitting: Gastroenterology

## 2018-08-20 DIAGNOSIS — R109 Unspecified abdominal pain: Secondary | ICD-10-CM

## 2018-08-22 NOTE — Telephone Encounter (Signed)
Yes you can refill for #30. Thanks

## 2018-08-22 NOTE — Telephone Encounter (Signed)
Incoming refill request for tramadol # 30. Last seen 06-02-2018.

## 2018-08-30 DIAGNOSIS — K5090 Crohn's disease, unspecified, without complications: Secondary | ICD-10-CM | POA: Diagnosis not present

## 2018-09-19 ENCOUNTER — Other Ambulatory Visit: Payer: Self-pay | Admitting: Gastroenterology

## 2018-09-19 DIAGNOSIS — R109 Unspecified abdominal pain: Secondary | ICD-10-CM

## 2018-09-19 NOTE — Telephone Encounter (Signed)
Last seen 05-2018.  Ok to refill Tramadol and Lomotil as requested?

## 2018-09-20 NOTE — Telephone Encounter (Signed)
Jan can you please edit Tramadol, should be 43m every 8 hours PRN. Thanks

## 2018-09-20 NOTE — Telephone Encounter (Signed)
OK.  Thanks. Can I give him a refill on either or both?

## 2018-09-20 NOTE — Telephone Encounter (Signed)
Yes can refill both, thanks

## 2018-09-20 NOTE — Telephone Encounter (Signed)
And yes can refill both, thanks

## 2018-10-11 ENCOUNTER — Other Ambulatory Visit: Payer: Self-pay | Admitting: Gastroenterology

## 2018-10-11 NOTE — Telephone Encounter (Signed)
Ok to refill Zofran ODT? Last seen in the office 05-2018. Colonoscopy 07-2018.

## 2018-10-11 NOTE — Telephone Encounter (Signed)
Yes that's fine. thanks

## 2018-10-18 ENCOUNTER — Other Ambulatory Visit: Payer: Self-pay

## 2018-10-18 ENCOUNTER — Telehealth: Payer: Self-pay

## 2018-10-18 MED ORDER — PREDNISONE 10 MG PO TABS: ORAL_TABLET | ORAL | 0 refills | Status: DC

## 2018-10-18 NOTE — Progress Notes (Signed)
Prednisone

## 2018-10-18 NOTE — Telephone Encounter (Signed)
Patient has been approved for Medicaid that pays only his Medicare premium until 07/31/19.  It will not cover any services for infusions or assist with Medicare Part D.  He is on a waiting list with [facility] for [place].  He has not been approved at this point for assistance.  The cost of the drug for the infusion as well as the maintenance is not affordable without the assistance program.  He is on a waiting list.  Please advise next step.

## 2018-10-18 NOTE — Telephone Encounter (Signed)
Patient asked to send me a copy of the Medicaid card or the letter he received.  Once I have it we will investigate Stelara to be infused in the hospital then see if the maintenance will be approved. Patent agreed and will send me a card or the letter

## 2018-10-21 NOTE — Telephone Encounter (Signed)
Called the patient - discussed the situation. He has failed Humira, Remicade, Cimzia, Entyvio, and has had allergies to some of them, as well as allergies to thiopurines and methotrexate. Stelara is the only thing left that is approved for his Crohns. He is on a wait list for assistance. I asked him to call [facility] and see how long this may take and what his chances are for actually getting approved for this. If it doesn't appear it's likely he will receive any help with this in the near future, then I may need to refer him to a tertiary care facility such as [facility] IBD clinic for consideration of a clinical trial. He verbalized understanding and will get back to me when he hears more.   [employer]

## 2018-10-29 ENCOUNTER — Other Ambulatory Visit: Payer: Self-pay | Admitting: Gastroenterology

## 2018-10-29 DIAGNOSIS — R109 Unspecified abdominal pain: Secondary | ICD-10-CM

## 2018-10-31 NOTE — Telephone Encounter (Signed)
Ok to refill 

## 2018-10-31 NOTE — Telephone Encounter (Signed)
Yes can refill thanks

## 2018-10-31 NOTE — Telephone Encounter (Signed)
Failed to Route

## 2018-11-07 ENCOUNTER — Other Ambulatory Visit: Payer: Self-pay

## 2018-11-07 MED ORDER — USTEKINUMAB 90 MG/ML ~~LOC~~ SOSY: 90.0000 mg | PREFILLED_SYRINGE | SUBCUTANEOUS | 6 refills | Status: DC

## 2018-11-07 NOTE — Telephone Encounter (Signed)
Yetta Flock, MD  Sent: Mon November 07, 2018 12:25 PM  To: Marlon Pel, RN      Message   If he can get the injections otherwise, I think it's his only option and yes would proceed with it. Not sure who else he can speak to about the infusion.     Thanks much  ----- Message -----  From: Marlon Pel, RN  Sent: 11/07/2018 11:47 AM EDT  To: Yetta Flock, MD  Subject: FW: Non-Urgent Medical Question           Are you okay with starting the Stelara without the infusion?    Aloria Looper  ----- Message -----  From: [name].  Sent: 11/07/2018 10:22 AM EDT  To: [facility] Clinical Pool  Subject: Non-Urgent Medical Question             Hi Dr Havery Moros   I called [facility] they say I need a prescription for Stelara from you. You are to Fax prescription to [phone]. However it's not required to have a infusion before taking the syringes of Stelara.  This may be better because I'm having a VERY DIFFICULT TIME getting funding for the infusion.  It took me forever just to get the Stelara syringes.  I don't have the extra money and the insurance doesn't want to pay the heavy cost of the infusion.  I Don't need the infusion to take the Stelara syringes. I asked the pharmacist about it and said I don't need the infusion and can start on the Stelara syringes. They are sending me the Stelara syringes on March 11. This took me a [place] deal of work and lots of time on the phone. This has been over three months since I had any medicine to help me. Please take this in consideration and Thank You   Thank You   Ron [employer]

## 2018-11-09 ENCOUNTER — Other Ambulatory Visit: Payer: Self-pay | Admitting: Gastroenterology

## 2018-11-09 ENCOUNTER — Telehealth: Payer: Self-pay | Admitting: Gastroenterology

## 2018-11-09 DIAGNOSIS — K50019 Crohn's disease of small intestine with unspecified complications: Secondary | ICD-10-CM

## 2018-11-09 NOTE — Telephone Encounter (Signed)
Stacia from [facility] called to verified whether pt needs IV loading dose for Stelara.

## 2018-11-09 NOTE — Telephone Encounter (Signed)
I have notified [facility] that no IV loading dose is needed.

## 2018-11-22 ENCOUNTER — Other Ambulatory Visit: Payer: Self-pay | Admitting: Gastroenterology

## 2018-11-22 DIAGNOSIS — R109 Unspecified abdominal pain: Secondary | ICD-10-CM

## 2018-11-22 DIAGNOSIS — K50919 Crohn's disease, unspecified, with unspecified complications: Secondary | ICD-10-CM

## 2018-11-22 NOTE — Telephone Encounter (Signed)
Dr. Havery Moros I am unable to refill Lomotil and Tramadol since they are controlled substances. Please refill if approved

## 2018-12-02 ENCOUNTER — Encounter: Payer: Self-pay | Admitting: Family Medicine

## 2018-12-06 ENCOUNTER — Other Ambulatory Visit: Payer: Self-pay | Admitting: Family Medicine

## 2018-12-07 NOTE — Telephone Encounter (Signed)
Please advise 

## 2018-12-07 NOTE — Telephone Encounter (Signed)
Pt calling to check on this.  States he is completely out - he requested on 12/02/2018 through [place] but never heard anything back.

## 2018-12-08 ENCOUNTER — Other Ambulatory Visit: Payer: Self-pay

## 2018-12-08 MED ORDER — POTASSIUM CHLORIDE ER 20 MEQ PO TBCR: 20.0000 meq | EXTENDED_RELEASE_TABLET | Freq: Every day | ORAL | 2 refills | Status: DC

## 2018-12-08 NOTE — Progress Notes (Unsigned)
Pot

## 2018-12-08 NOTE — Telephone Encounter (Signed)
Yes we can refill. #90, RF1. Thanks

## 2018-12-10 MED ORDER — POTASSIUM CHLORIDE CRYS ER 20 MEQ PO TBCR: EXTENDED_RELEASE_TABLET | ORAL | 1 refills | Status: DC

## 2018-12-12 ENCOUNTER — Other Ambulatory Visit: Payer: Self-pay

## 2018-12-12 ENCOUNTER — Other Ambulatory Visit (INDEPENDENT_AMBULATORY_CARE_PROVIDER_SITE_OTHER): Payer: Medicare Other

## 2018-12-12 DIAGNOSIS — K50019 Crohn's disease of small intestine with unspecified complications: Secondary | ICD-10-CM | POA: Diagnosis not present

## 2018-12-12 DIAGNOSIS — M2550 Pain in unspecified joint: Secondary | ICD-10-CM

## 2018-12-12 DIAGNOSIS — E559 Vitamin D deficiency, unspecified: Secondary | ICD-10-CM | POA: Diagnosis not present

## 2018-12-12 DIAGNOSIS — K50819 Crohn's disease of both small and large intestine with unspecified complications: Secondary | ICD-10-CM

## 2018-12-12 LAB — COMPREHENSIVE METABOLIC PANEL
ALT: 10 U/L (ref 0–53)
AST: 15 U/L (ref 0–37)
Albumin: 3.4 g/dL — ABNORMAL LOW (ref 3.5–5.2)
Alkaline Phosphatase: 81 U/L (ref 39–117)
BUN: 9 mg/dL (ref 6–23)
CO2: 30 mEq/L (ref 19–32)
Calcium: 8.5 mg/dL (ref 8.4–10.5)
Chloride: 102 mEq/L (ref 96–112)
Creatinine, Ser: 1.28 mg/dL (ref 0.40–1.50)
GFR: 57.79 mL/min — ABNORMAL LOW (ref >60.00)
Glucose, Bld: 88 mg/dL (ref 70–99)
Potassium: 4 mEq/L (ref 3.5–5.1)
Sodium: 140 mEq/L (ref 135–145)
Total Bilirubin: 1.3 mg/dL — ABNORMAL HIGH (ref 0.2–1.2)
Total Protein: 6 g/dL (ref 6.0–8.3)

## 2018-12-12 LAB — CBC WITH DIFFERENTIAL/PLATELET
Basophils Absolute: 0.1 10*3/uL (ref 0.0–0.1)
Basophils Relative: 0.5 % (ref 0.0–3.0)
Eosinophils Absolute: 0.3 10*3/uL (ref 0.0–0.7)
Eosinophils Relative: 2.5 % (ref 0.0–5.0)
HCT: 38.7 % — ABNORMAL LOW (ref 39.0–52.0)
Hemoglobin: 13 g/dL (ref 13.0–17.0)
Lymphocytes Relative: 21.4 % (ref 12.0–46.0)
Lymphs Abs: 2.2 10*3/uL (ref 0.7–4.0)
MCHC: 33.6 g/dL (ref 30.0–36.0)
MCV: 113.1 fl — ABNORMAL HIGH (ref 78.0–100.0)
Monocytes Absolute: 1 10*3/uL (ref 0.1–1.0)
Monocytes Relative: 9.1 % (ref 3.0–12.0)
Neutro Abs: 7 10*3/uL (ref 1.4–7.7)
Neutrophils Relative %: 66.5 % (ref 43.0–77.0)
Platelets: 308 10*3/uL (ref 150.0–400.0)
RBC: 3.42 Mil/uL — ABNORMAL LOW (ref 4.22–5.81)
RDW: 18.4 % — ABNORMAL HIGH (ref 11.5–15.5)
WBC: 10.5 10*3/uL (ref 4.0–10.5)

## 2018-12-12 LAB — VITAMIN D 25 HYDROXY (VIT D DEFICIENCY, FRACTURES): VITD: 23.9 ng/mL — ABNORMAL LOW (ref 30.00–100.00)

## 2018-12-13 ENCOUNTER — Other Ambulatory Visit: Payer: Self-pay

## 2018-12-13 DIAGNOSIS — K50819 Crohn's disease of both small and large intestine with unspecified complications: Secondary | ICD-10-CM

## 2018-12-13 DIAGNOSIS — E559 Vitamin D deficiency, unspecified: Secondary | ICD-10-CM

## 2018-12-14 ENCOUNTER — Other Ambulatory Visit: Payer: Self-pay | Admitting: Gastroenterology

## 2018-12-16 ENCOUNTER — Other Ambulatory Visit: Payer: Self-pay | Admitting: Gastroenterology

## 2018-12-16 DIAGNOSIS — K50919 Crohn's disease, unspecified, with unspecified complications: Secondary | ICD-10-CM

## 2018-12-16 NOTE — Telephone Encounter (Signed)
Dr Havery Moros Can we refill these medications for this patient ?  Thanks

## 2018-12-19 NOTE — Telephone Encounter (Signed)
Yes can refill both. Thanks

## 2018-12-23 ENCOUNTER — Other Ambulatory Visit (INDEPENDENT_AMBULATORY_CARE_PROVIDER_SITE_OTHER): Payer: Medicare Other

## 2018-12-23 DIAGNOSIS — K50819 Crohn's disease of both small and large intestine with unspecified complications: Secondary | ICD-10-CM | POA: Diagnosis not present

## 2018-12-23 DIAGNOSIS — E559 Vitamin D deficiency, unspecified: Secondary | ICD-10-CM | POA: Diagnosis not present

## 2018-12-23 LAB — CBC WITH DIFFERENTIAL/PLATELET
Basophils Absolute: 0.1 10*3/uL (ref 0.0–0.1)
Basophils Relative: 0.6 % (ref 0.0–3.0)
Eosinophils Absolute: 0.3 10*3/uL (ref 0.0–0.7)
Eosinophils Relative: 2.7 % (ref 0.0–5.0)
HCT: 37.8 % — ABNORMAL LOW (ref 39.0–52.0)
Hemoglobin: 12.6 g/dL — ABNORMAL LOW (ref 13.0–17.0)
Lymphocytes Relative: 22.2 % (ref 12.0–46.0)
Lymphs Abs: 2.4 10*3/uL (ref 0.7–4.0)
MCHC: 33.4 g/dL (ref 30.0–36.0)
MCV: 114.3 fl — ABNORMAL HIGH (ref 78.0–100.0)
Monocytes Absolute: 1.2 10*3/uL — ABNORMAL HIGH (ref 0.1–1.0)
Monocytes Relative: 11.4 % (ref 3.0–12.0)
Neutro Abs: 6.7 10*3/uL (ref 1.4–7.7)
Neutrophils Relative %: 63.1 % (ref 43.0–77.0)
Platelets: 344 10*3/uL (ref 150.0–400.0)
RBC: 3.31 Mil/uL — ABNORMAL LOW (ref 4.22–5.81)
RDW: 17.5 % — ABNORMAL HIGH (ref 11.5–15.5)
WBC: 10.6 10*3/uL — ABNORMAL HIGH (ref 4.0–10.5)

## 2018-12-23 LAB — B12 AND FOLATE PANEL
Folate: 10.9 ng/mL (ref >5.9)
Vitamin B-12: 72 pg/mL — ABNORMAL LOW (ref 211–911)

## 2018-12-26 ENCOUNTER — Other Ambulatory Visit: Payer: Self-pay

## 2018-12-26 ENCOUNTER — Telehealth: Payer: Self-pay | Admitting: *Deleted

## 2018-12-26 DIAGNOSIS — E559 Vitamin D deficiency, unspecified: Secondary | ICD-10-CM

## 2018-12-26 LAB — VEDOLIZUMAB AND ANTI-VEDO AB
Anti-Vedolizumab Antibody: <25 ng/mL
Vedolizumab: 1.3 ug/mL

## 2018-12-26 LAB — SPECIMEN STATUS REPORT

## 2018-12-26 NOTE — Telephone Encounter (Signed)
Copied from [place] [id number]. Topic: Appointment Scheduling - Scheduling Inquiry for Clinic >> Dec 26, 2018  2:33 PM Scherrie Gerlach wrote: Reason for CRM: Sherlynn Stalls from [facility], Dr Havery Moros office calling to get pt set up on B12 inj. His level 72 Tammy from the office advised not doing B12 at this time, but I would send message for Dr Volanda Napoleon review and advice

## 2018-12-26 NOTE — Progress Notes (Unsigned)
Called PCP and requested the B12 inj.for patient. They aren't usually doing injections right now because of COVID-19. But they are sending a message to Dr. Volanda Napoleon and see if they can do it since patient's level is so low.

## 2018-12-27 NOTE — Telephone Encounter (Signed)
Given h/o chronic GI issues, pt would likely benefit from IM B12 rather than SL.  GI office can instruct pt or family member how to do regular injections.

## 2018-12-29 ENCOUNTER — Other Ambulatory Visit: Payer: Self-pay

## 2018-12-29 LAB — QUANTIFERON-TB GOLD PLUS
Mitogen-NIL: 7.3 IU/mL
NIL: 0.04 IU/mL
QuantiFERON-TB Gold Plus: NEGATIVE
TB1-NIL: <0 IU/mL
TB2-NIL: <0 IU/mL

## 2018-12-29 LAB — OTHER LAB TEST: PRICE:: 440

## 2018-12-29 MED ORDER — CYANOCOBALAMIN 1000 MCG/ML IJ SOLN: 1000.0000 ug | INTRAMUSCULAR | 0 refills | Status: DC

## 2018-12-30 NOTE — Telephone Encounter (Signed)
Called [facility], left a message to return call in the office regarding pt B12

## 2019-01-02 NOTE — Telephone Encounter (Signed)
Spoke with Ivor Costa with [facility] verbalized understanding of the instructions given by Dr Volanda Napoleon regarding pt B12  Medications. States that pt was directed on how to get the injections.

## 2019-01-03 ENCOUNTER — Telehealth: Payer: Self-pay | Admitting: Gastroenterology

## 2019-01-03 NOTE — Telephone Encounter (Signed)
Pt new pharmacy called in wanting to speak with someone about the pt prescription. His name is Gregory [name] and the cb num [phone]

## 2019-01-04 NOTE — Telephone Encounter (Signed)
Called and spoke to [facility].  Prednisone and Tramadol do not have refills and have already been sent to [facility]. Requested that the [facility] transfer the other scripts to them or have the patient call us when he is in need of a NEW script and we can send to them at that point

## 2019-01-07 ENCOUNTER — Other Ambulatory Visit: Payer: Self-pay | Admitting: Gastroenterology

## 2019-01-07 DIAGNOSIS — K50919 Crohn's disease, unspecified, with unspecified complications: Secondary | ICD-10-CM

## 2019-01-09 ENCOUNTER — Telehealth: Payer: Self-pay

## 2019-01-09 DIAGNOSIS — K50919 Crohn's disease, unspecified, with unspecified complications: Secondary | ICD-10-CM

## 2019-01-09 MED ORDER — TRAMADOL HCL 50 MG PO TABS: 50.0000 mg | ORAL_TABLET | Freq: Three times a day (TID) | ORAL | 1 refills | Status: DC | PRN

## 2019-01-09 NOTE — Telephone Encounter (Signed)
OK to refill Tramadol? Note: He uses [facility] now).  Thank you

## 2019-01-09 NOTE — Telephone Encounter (Signed)
Yes that is okay, can give him 1 refill.

## 2019-01-09 NOTE — Telephone Encounter (Signed)
SCRIPT SENT TO [facility]

## 2019-01-24 ENCOUNTER — Telehealth: Payer: Self-pay

## 2019-01-24 NOTE — Telephone Encounter (Signed)
Called patient and asked him to come in for CBC and CMET. He will come in next week when he gets paid. (gas money)

## 2019-01-24 NOTE — Telephone Encounter (Signed)
-----   Message from Hughie Closs, RN sent at 12/26/2018  2:38 PM EDT ----- Call patient and have him come get labs drawn. CBC & CMET. Already in Epic

## 2019-01-26 ENCOUNTER — Encounter: Payer: Self-pay | Admitting: Family Medicine

## 2019-01-27 ENCOUNTER — Other Ambulatory Visit (INDEPENDENT_AMBULATORY_CARE_PROVIDER_SITE_OTHER): Payer: Medicare Other

## 2019-01-27 DIAGNOSIS — E559 Vitamin D deficiency, unspecified: Secondary | ICD-10-CM | POA: Diagnosis not present

## 2019-01-27 LAB — CBC WITH DIFFERENTIAL/PLATELET
Basophils Absolute: 0 10*3/uL (ref 0.0–0.1)
Basophils Relative: 0.1 % (ref 0.0–3.0)
Eosinophils Absolute: 0.1 10*3/uL (ref 0.0–0.7)
Eosinophils Relative: 0.5 % (ref 0.0–5.0)
HCT: 36.9 % — ABNORMAL LOW (ref 39.0–52.0)
Hemoglobin: 12.4 g/dL — ABNORMAL LOW (ref 13.0–17.0)
Lymphocytes Relative: 15.3 % (ref 12.0–46.0)
Lymphs Abs: 2.3 10*3/uL (ref 0.7–4.0)
MCHC: 33.5 g/dL (ref 30.0–36.0)
MCV: 118.3 fl — ABNORMAL HIGH (ref 78.0–100.0)
Monocytes Absolute: 1.4 10*3/uL — ABNORMAL HIGH (ref 0.1–1.0)
Monocytes Relative: 9.2 % (ref 3.0–12.0)
Neutro Abs: 11.3 10*3/uL — ABNORMAL HIGH (ref 1.4–7.7)
Neutrophils Relative %: 74.9 % (ref 43.0–77.0)
Platelets: 214 10*3/uL (ref 150.0–400.0)
RBC: 3.12 Mil/uL — ABNORMAL LOW (ref 4.22–5.81)
RDW: 18.1 % — ABNORMAL HIGH (ref 11.5–15.5)
WBC: 15.1 10*3/uL — ABNORMAL HIGH (ref 4.0–10.5)

## 2019-01-27 LAB — COMPREHENSIVE METABOLIC PANEL
ALT: 13 U/L (ref 0–53)
AST: 11 U/L (ref 0–37)
Albumin: 3.2 g/dL — ABNORMAL LOW (ref 3.5–5.2)
Alkaline Phosphatase: 64 U/L (ref 39–117)
BUN: 13 mg/dL (ref 6–23)
CO2: 28 mEq/L (ref 19–32)
Calcium: 8.2 mg/dL — ABNORMAL LOW (ref 8.4–10.5)
Chloride: 104 mEq/L (ref 96–112)
Creatinine, Ser: 1.05 mg/dL (ref 0.40–1.50)
GFR: 72.6 mL/min (ref >60.00)
Glucose, Bld: 63 mg/dL — ABNORMAL LOW (ref 70–99)
Potassium: 3.2 mEq/L — ABNORMAL LOW (ref 3.5–5.1)
Sodium: 140 mEq/L (ref 135–145)
Total Bilirubin: 1.3 mg/dL — ABNORMAL HIGH (ref 0.2–1.2)
Total Protein: 5.8 g/dL — ABNORMAL LOW (ref 6.0–8.3)

## 2019-01-30 ENCOUNTER — Telehealth: Payer: Self-pay

## 2019-01-30 ENCOUNTER — Other Ambulatory Visit: Payer: Self-pay

## 2019-01-30 DIAGNOSIS — E559 Vitamin D deficiency, unspecified: Secondary | ICD-10-CM

## 2019-01-30 MED ORDER — POTASSIUM CHLORIDE 20 MEQ PO PACK: 20.0000 meq | PACK | Freq: Every day | ORAL | 1 refills | Status: DC

## 2019-01-30 NOTE — Telephone Encounter (Signed)
Klor-Con 20 Meq 1 Tab daily refill sent to patient's Pharmacy. Patient called and notified

## 2019-02-02 ENCOUNTER — Telehealth: Payer: Self-pay

## 2019-02-02 ENCOUNTER — Other Ambulatory Visit: Payer: Self-pay

## 2019-02-02 MED ORDER — TRAMADOL HCL 50 MG PO TABS: 50.0000 mg | ORAL_TABLET | Freq: Three times a day (TID) | ORAL | 0 refills | Status: DC | PRN

## 2019-02-02 NOTE — Telephone Encounter (Signed)
Tramadol refill sent to pharmacy and faxed.message to My Chart, letting patient know

## 2019-02-10 ENCOUNTER — Telehealth: Payer: Self-pay

## 2019-02-10 NOTE — Telephone Encounter (Signed)
Patient called and asked to come into our lab Mon. 02/13/19 between 7:30am-4:30pm to have his CBC drawn. Patient agreed

## 2019-02-13 ENCOUNTER — Other Ambulatory Visit: Payer: Self-pay

## 2019-02-13 ENCOUNTER — Telehealth: Payer: Self-pay

## 2019-02-13 MED ORDER — ONDANSETRON HCL 4 MG PO TABS: 4.0000 mg | ORAL_TABLET | Freq: Three times a day (TID) | ORAL | 3 refills | Status: DC | PRN

## 2019-02-13 NOTE — Telephone Encounter (Signed)
Refill sent to patient's pharmacy for Ondansetron. Patient notified via My Chart

## 2019-02-16 ENCOUNTER — Other Ambulatory Visit (INDEPENDENT_AMBULATORY_CARE_PROVIDER_SITE_OTHER): Payer: Medicare Other

## 2019-02-16 DIAGNOSIS — E559 Vitamin D deficiency, unspecified: Secondary | ICD-10-CM

## 2019-02-16 LAB — CBC WITH DIFFERENTIAL/PLATELET
Basophils Absolute: 0.2 10*3/uL — ABNORMAL HIGH (ref 0.0–0.1)
Basophils Relative: 1.4 % (ref 0.0–3.0)
Eosinophils Absolute: 0.1 10*3/uL (ref 0.0–0.7)
Eosinophils Relative: 0.6 % (ref 0.0–5.0)
HCT: 42.2 % (ref 39.0–52.0)
Hemoglobin: 13.8 g/dL (ref 13.0–17.0)
Lymphocytes Relative: 16.1 % (ref 12.0–46.0)
Lymphs Abs: 1.9 10*3/uL (ref 0.7–4.0)
MCHC: 32.8 g/dL (ref 30.0–36.0)
MCV: 110 fl — ABNORMAL HIGH (ref 78.0–100.0)
Monocytes Absolute: 0.9 10*3/uL (ref 0.1–1.0)
Monocytes Relative: 7.1 % (ref 3.0–12.0)
Neutro Abs: 9 10*3/uL — ABNORMAL HIGH (ref 1.4–7.7)
Neutrophils Relative %: 74.8 % (ref 43.0–77.0)
Platelets: 339 10*3/uL (ref 150.0–400.0)
RBC: 3.83 Mil/uL — ABNORMAL LOW (ref 4.22–5.81)
RDW: 19.5 % — ABNORMAL HIGH (ref 11.5–15.5)
WBC: 12 10*3/uL — ABNORMAL HIGH (ref 4.0–10.5)

## 2019-03-07 ENCOUNTER — Other Ambulatory Visit: Payer: Self-pay

## 2019-03-07 MED ORDER — TRAMADOL HCL 50 MG PO TABS: 50.0000 mg | ORAL_TABLET | Freq: Three times a day (TID) | ORAL | 0 refills | Status: DC | PRN

## 2019-03-14 DIAGNOSIS — Z79899 Other long term (current) drug therapy: Secondary | ICD-10-CM | POA: Diagnosis not present

## 2019-03-14 DIAGNOSIS — K5090 Crohn's disease, unspecified, without complications: Secondary | ICD-10-CM | POA: Diagnosis not present

## 2019-03-17 ENCOUNTER — Other Ambulatory Visit: Payer: Self-pay

## 2019-03-17 MED ORDER — DIPHENOXYLATE-ATROPINE 2.5-0.025 MG PO TABS: 1.0000 | ORAL_TABLET | Freq: Four times a day (QID) | ORAL | 1 refills | Status: DC | PRN

## 2019-04-10 ENCOUNTER — Ambulatory Visit (INDEPENDENT_AMBULATORY_CARE_PROVIDER_SITE_OTHER): Payer: Medicare Other | Admitting: Gastroenterology

## 2019-04-10 ENCOUNTER — Encounter: Payer: Self-pay | Admitting: Gastroenterology

## 2019-04-10 VITALS — BP 80/50 | HR 68 | Temp 98.4°F | Ht 69.25 in | Wt 124.0 lb

## 2019-04-10 DIAGNOSIS — E538 Deficiency of other specified B group vitamins: Secondary | ICD-10-CM

## 2019-04-10 DIAGNOSIS — K50019 Crohn's disease of small intestine with unspecified complications: Secondary | ICD-10-CM

## 2019-04-10 DIAGNOSIS — Z79899 Other long term (current) drug therapy: Secondary | ICD-10-CM | POA: Diagnosis not present

## 2019-04-10 DIAGNOSIS — E559 Vitamin D deficiency, unspecified: Secondary | ICD-10-CM

## 2019-04-10 DIAGNOSIS — Z23 Encounter for immunization: Secondary | ICD-10-CM | POA: Diagnosis not present

## 2019-04-10 DIAGNOSIS — Z933 Colostomy status: Secondary | ICD-10-CM | POA: Diagnosis not present

## 2019-04-10 MED ORDER — CYANOCOBALAMIN 1000 MCG/ML IJ SOLN
1000.0000 ug | INTRAMUSCULAR | Status: AC
  Administered 2019-05-03: 1000 ug via INTRAMUSCULAR

## 2019-04-10 MED ORDER — CYANOCOBALAMIN 1000 MCG/ML IJ SOLN: 1000.0000 ug | INTRAMUSCULAR | 3 refills | Status: DC

## 2019-04-10 NOTE — Patient Instructions (Addendum)
If you are age 58 or older, your body mass index should be between 23-30. Your Body mass index is 18.18 kg/m. If this is out of the aforementioned range listed, please consider follow up with your Primary Care Provider.  If you are age 40 or younger, your body mass index should be between 19-25. Your Body mass index is 18.18 kg/m. If this is out of the aformentioned range listed, please consider follow up with your Primary Care Provider.   Please go to the lab in the basement of our building to have lab work in 1 month. Hit "B" for basement when you get on the elevator.  When the doors open the lab is on your left.  We will call you with the results. Thank you.  We have given you a Pneumococcal vaccine today.  [place] to scheduled a Shingrix series:  [phone].  We have sent the following medications to your pharmacy for you to pick up at your convenience: B12 101mg: once a week for 4 weeks and then monthly thereafter  You will be due for a follow up in 6 months.  Thank you for entrusting me with your care and [facility]

## 2019-04-10 NOTE — Progress Notes (Signed)
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 He has acolostomywith a remnant rectal pouch. He reports he has had a bowel surgery in the 1982, with a "few more surgeries" in years after while in [place], as well as in [facility], they tried reversing hiscolostomywhich did not workwell for him, too much diarrhea and it was then reversed back. He has a history of abscess associated with small bowel.   He has been hospitalized multiple times in recent years, He hadbeen on chronic prednisone for 10 years with fluctuating levels. HewasCimzia for about 4-5 years in the past. He has been on sulfasalazine for a long time. He reports an allergy to 6MP - he does not know the details ofthe reaction. He had an allergic reaction - hives, to both Remicade and Humira.Allergy to methotrexate. Failed therapeutic Weyman Rodney, now on Stelara since March 2019.  SINCE LAST VISIT  58 y/o male here for a follow up visit for Crohn's disease. See extensive history as above. 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 He subsequently was referred to start Stelara. His insurance would cover the injections of Stelara but NOT the infusion. Given this was his best option he started the injections without the infusion in March. He was also found to have a severe B12 deficiency, was on supplemental B12 for a few months but then ran out and stopped it.   In general he is feeling pretty well. He states he has noticed a difference since starting the Stelara. He endorses weight gain on the regimen, he is eating more, but because he eats more he tends to use the bathroom more frequently. He denies any obvious blood in his stools. He has some mild chronic discomfort in his abdomen  which is well controlled with Tramadol PRN. He denies any lightheadedness or dizziness. He does have some fatigue and wonders if related to him stopping the B12. He reports BP is chronically low. He has not been smoking any tobacco. He has been taking vitamin D supplementation dosed at 4000 IU / day.  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 Normal colon, however prep was poor in certain portion of the colon and was inadequate for screening purposes to rule out polyps, etc. The ostomy was normal..  IBD Health Care Maintenance: Annual Flu Vaccine - Date due 2020 Pneumococcal Vaccine if receiving immunosuppression: - PCV 13 01/10/18, PPSV23 - due Zoster vaccine if over age 21: due TB testing if on anti-TNF, yearly - Date 4/20 negative Vitamin D screening - Date 4/20 - deficient Last Colonoscopy - 07/2018     Past Medical History:  Diagnosis Date  . Acute endocarditis   . Crohn's disease ([place])   . Depression   . Eating disorder   . GERD (gastroesophageal reflux disease)   . Kidney stones   . Short gut syndrome      Past Surgical History:  Procedure Laterality Date  . COLONOSCOPY WITH PROPOFOL N/A  09/17/2017   Procedure: COLONOSCOPY WITH PROPOFOL;  Surgeon: [name], MD;  Location: [facility];  Service: Endoscopy;  Laterality: N/A;  Through ileostomy.  [name] WITH STENT PLACEMENT Left 05/20/2017   Procedure: CYSTOSCOPY WITH STENT PLACEMENT;  Surgeon: [name], MD;  Location: [facility];  Service: Urology;  Laterality: Left;  . ILEOSTOMY     . IR NEPHROSTOMY EXCHANGE LEFT  04/12/2017  . IR NEPHROSTOMY PLACEMENT LEFT  04/12/2017  . IR NEPHROSTOMY PLACEMENT LEFT  04/29/2017  . NEPHROLITHOTOMY Left 05/14/2017   Procedure: LEFT NEPHROLITHOTOMY PERCUTANEOUS WITH SURGEON ACCESS;  Surgeon: [name], MD;  Location: [facility];  Service: Urology;  Laterality: Left;  . SMALL INTESTINE SURGERY    . SUBTOTAL COLECTOMY     Family History  Problem Relation Age of Onset  . Heart disease Mother   . Diabetes Father   . Heart disease Father   . Heart attack Father   . Melanoma Sister   . Crohn's disease Brother   . Inflammatory bowel disease Brother   . Diabetes Paternal Grandmother   . Colon cancer Neg Hx   . Rectal cancer Neg Hx   . Stomach cancer Neg Hx   . Esophageal cancer Neg Hx    Social History   Tobacco Use  . Smoking status: Former Smoker    Packs/day: 1.00    Years: 20.00    Pack years: 20.00    Types: Cigarettes    Quit date: 08/31/2006    Years since quitting: 12.6  . Smokeless tobacco: Never Used  Substance Use Topics  . Alcohol use: No  . Drug use: No   Current Outpatient Medications  Medication Sig Dispense Refill  . acetaminophen (TYLENOL) 500 MG tablet Take 500 mg by mouth every 6 (six) hours as needed for moderate pain.    . cholestyramine light (PREVALITE) 4 g packet Take 1 packet (4 g total) by mouth daily. 30 packet 0  . cyanocobalamin (,VITAMIN B-12,) 1000 MCG/ML injection Inject 1 mL (1,000 mcg total) into the muscle once a week. Inject once a wk. for 4 weeks, then once a month 6 mL 3  . diphenoxylate-atropine (LOMOTIL) 2.5-0.025 MG tablet Take 1 tablet by mouth 4 (four) times daily as needed for diarrhea or loose stools. 90 tablet 1  . ferrous sulfate 325 (65 FE) MG tablet TAKE 1 TABLET (325 MG TOTAL) 2 (TWO) TIMES DAILY WITH A MEAL BY MOUTH. 120 tablet 1  . ipratropium (ATROVENT) 0.03 % nasal spray Place 2 sprays 2 (two) times daily into both nostrils. Can use 3 times a day as needed 30 mL 2  .  Multiple Vitamin (MULTIVITAMIN WITH MINERALS) TABS tablet Take 1 tablet by mouth daily. 30 tablet 3  . ondansetron (ZOFRAN-ODT) 4 MG disintegrating tablet TAKE ONE TABLET BY MOUTH EVERY 8 HOURS AS NEEDED FOR NAUSEA AND VOMITING 30 tablet 2  . potassium chloride (KLOR-CON) 20 MEQ packet Take 20 mEq by mouth daily. (Patient taking differently: Take 20 mEq by mouth daily. Patient is taking one on Monday and one on Thursday) 90 tablet 1  . sertraline (ZOLOFT) 50 MG tablet Take 1 tablet (50 mg total) by mouth daily. 90 tablet 1  . traMADol (ULTRAM) 50 MG tablet Take 1 tablet (50 mg total) by mouth every 8 (eight) hours as needed. (Patient taking differently: Take 50 mg by mouth 2 (two) times daily. Pt taking twice a day) 90 tablet 0  . ustekinumab (STELARA) 90 MG/ML SOSY injection  Inject 1 mL (90 mg total) into the skin every 8 (eight) weeks. 1 Syringe 6  . Vitamin D, Ergocalciferol, 2000 units CAPS Take 1 capsule by mouth daily.     Current Facility-Administered Medications  Medication Dose Route Frequency Provider Last Rate Last Dose  . cyanocobalamin ((VITAMIN B-12)) injection 1,000 mcg  1,000 mcg Intramuscular Weekly Marjarie Irion, Carlota Raspberry, MD       Allergies  Allergen Reactions  . Methotrexate Derivatives Other (See Comments)    Anemia, low WBC, severe GI symptoms,   . Phenergan [Promethazine Hcl] Other (See Comments)    twitching  . Humira [Adalimumab] Other (See Comments)    Intolerance  . Penicillins Other (See Comments)    REACTION DURING SURGERY Has patient had a PCN reaction causing immediate rash, facial/tongue/throat swelling, SOB or lightheadedness with hypotension: Unknown Has patient had a PCN reaction causing severe rash involving mucus membranes or skin necrosis: Unknown Has patient had a PCN reaction that required hospitalization: Unknown Has patient had a PCN reaction occurring within the last 10 years: No If all of the above answers are "NO", then may proceed with  Cephalosporin use.   . Remicade [Infliximab] Other (See Comments)    Intolerance     Review of Systems: All systems reviewed and negative except where noted in HPI.   Lab Results  Component Value Date   WBC 12.0 (H) 02/16/2019   HGB 13.8 02/16/2019   HCT 42.2 02/16/2019   MCV 110.0 (H) 02/16/2019   PLT 339.0 02/16/2019    Lab Results  Component Value Date   CREATININE 1.05 01/27/2019   BUN 13 01/27/2019   NA 140 01/27/2019   K 3.2 (L) 01/27/2019   CL 104 01/27/2019   CO2 28 01/27/2019    Lab Results  Component Value Date   ALT 13 01/27/2019   AST 11 01/27/2019   ALKPHOS 64 01/27/2019   BILITOT 1.3 (H) 01/27/2019     Physical Exam: BP (!) 80/50   Pulse 68   Temp 98.4 F (36.9 C)   Ht 5' 9.25" (1.759 m)   Wt 124 lb (56.2 kg)   BMI 18.18 kg/m  Constitutional: Pleasant,well-developed,male in no acute distress. HEENT: Normocephalic and atraumatic. Conjunctivae are normal. No scleral icterus. Neck supple.  Cardiovascular: Normal rate, regular rhythm.  Pulmonary/chest: Effort normal and breath sounds normal. No wheezing, rales or rhonchi. Abdominal: Soft, nondistended, nontender. Colostomy LLQ There are no masses palpable. No hepatomegaly. Extremities: no edema Lymphadenopathy: No cervical adenopathy noted. Neurological: Alert and oriented to person place and time. Skin: Skin is warm and dry. No rashes noted. Psychiatric: Normal mood and affect. Behavior is normal.   ASSESSMENT AND PLAN: 58 y/o male here for reassessment of the following issues:  Complicated small bowel Crohn's disease / LLQ ostomy / B12 deficiency / Vitamin D deficiency / High risk medication use - h/o multiple resections, 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 Most recently on Stelara and appears to be clinically doing pretty well. Will continue regimen for now, he is due for some basic labs as outlined and needs to resume B12 and vitamin D supplements. Will  check fecal calprotectin to assess for active inflammation after discussion of options. At some point he will need colonoscopy to reassess the anastomosis. Due for PPSv23 today which was administered, also due for Shingrix vaccine  PLAN: - continue Stelara - CBC, CMET, fecal calprotectin, Vitamin D, vitamin B12 level in 1 month - resume B12 and vitamin  D supplementation - PPSV 23 vaccine today - referred for shingrix vaccine - flu shot this fall - follow up in 3-4 months.   [place] Cellar, MD [facility]

## 2019-04-19 ENCOUNTER — Ambulatory Visit (INDEPENDENT_AMBULATORY_CARE_PROVIDER_SITE_OTHER): Payer: Medicare Other | Admitting: Gastroenterology

## 2019-04-19 ENCOUNTER — Other Ambulatory Visit: Payer: Self-pay

## 2019-04-19 DIAGNOSIS — E538 Deficiency of other specified B group vitamins: Secondary | ICD-10-CM | POA: Diagnosis not present

## 2019-04-19 MED ORDER — ONDANSETRON HCL 4 MG PO TABS: 4.0000 mg | ORAL_TABLET | Freq: Three times a day (TID) | ORAL | 2 refills | Status: DC | PRN

## 2019-04-19 MED ORDER — CYANOCOBALAMIN 1000 MCG/ML IJ SOLN
1000.0000 ug | Freq: Once | INTRAMUSCULAR | Status: AC
  Administered 2019-04-19: 14:00:00 1000 ug via INTRAMUSCULAR

## 2019-04-19 MED ORDER — TRAMADOL HCL 50 MG PO TABS: 50.0000 mg | ORAL_TABLET | Freq: Two times a day (BID) | ORAL | 0 refills | Status: DC

## 2019-04-19 MED ORDER — DIPHENOXYLATE-ATROPINE 2.5-0.025 MG PO TABS: 1.0000 | ORAL_TABLET | Freq: Four times a day (QID) | ORAL | 1 refills | Status: DC | PRN

## 2019-04-26 ENCOUNTER — Ambulatory Visit (INDEPENDENT_AMBULATORY_CARE_PROVIDER_SITE_OTHER): Payer: Medicare Other | Admitting: Gastroenterology

## 2019-04-26 DIAGNOSIS — K5080 Crohn's disease of both small and large intestine without complications: Secondary | ICD-10-CM

## 2019-05-03 ENCOUNTER — Other Ambulatory Visit: Payer: Self-pay

## 2019-05-03 ENCOUNTER — Ambulatory Visit (INDEPENDENT_AMBULATORY_CARE_PROVIDER_SITE_OTHER): Payer: Medicare Other | Admitting: Gastroenterology

## 2019-05-03 DIAGNOSIS — Z23 Encounter for immunization: Secondary | ICD-10-CM | POA: Diagnosis not present

## 2019-05-03 DIAGNOSIS — E538 Deficiency of other specified B group vitamins: Secondary | ICD-10-CM | POA: Diagnosis not present

## 2019-05-03 MED ORDER — CYANOCOBALAMIN 1000 MCG/ML IJ SOLN
1000.0000 ug | INTRAMUSCULAR | Status: DC
  Administered 2019-05-11 – 2019-07-10 (×3): 1000 ug via INTRAMUSCULAR

## 2019-05-03 NOTE — Progress Notes (Addendum)
Administered pt's 3rd of 4 weekly B12 injections.  Next appt will be for 4th and final weekly injection.  Then he will have monthly injections starting 30 days after his finally weekly.    Pt also received a flu shot

## 2019-05-11 ENCOUNTER — Ambulatory Visit (INDEPENDENT_AMBULATORY_CARE_PROVIDER_SITE_OTHER): Payer: Medicare Other | Admitting: Gastroenterology

## 2019-05-11 ENCOUNTER — Other Ambulatory Visit: Payer: Self-pay

## 2019-05-11 ENCOUNTER — Other Ambulatory Visit (INDEPENDENT_AMBULATORY_CARE_PROVIDER_SITE_OTHER): Payer: Medicare Other

## 2019-05-11 DIAGNOSIS — E538 Deficiency of other specified B group vitamins: Secondary | ICD-10-CM

## 2019-05-11 DIAGNOSIS — Z79899 Other long term (current) drug therapy: Secondary | ICD-10-CM

## 2019-05-11 DIAGNOSIS — Z933 Colostomy status: Secondary | ICD-10-CM | POA: Diagnosis not present

## 2019-05-11 DIAGNOSIS — K50019 Crohn's disease of small intestine with unspecified complications: Secondary | ICD-10-CM | POA: Diagnosis not present

## 2019-05-11 DIAGNOSIS — K5080 Crohn's disease of both small and large intestine without complications: Secondary | ICD-10-CM

## 2019-05-11 DIAGNOSIS — E559 Vitamin D deficiency, unspecified: Secondary | ICD-10-CM

## 2019-05-11 LAB — CBC WITH DIFFERENTIAL/PLATELET
Basophils Absolute: 0.1 10*3/uL (ref 0.0–0.1)
Basophils Relative: 0.7 % (ref 0.0–3.0)
Eosinophils Absolute: 0.2 10*3/uL (ref 0.0–0.7)
Eosinophils Relative: 1.9 % (ref 0.0–5.0)
HCT: 35.1 % — ABNORMAL LOW (ref 39.0–52.0)
Hemoglobin: 11.7 g/dL — ABNORMAL LOW (ref 13.0–17.0)
Lymphocytes Relative: 18.3 % (ref 12.0–46.0)
Lymphs Abs: 1.9 10*3/uL (ref 0.7–4.0)
MCHC: 33.3 g/dL (ref 30.0–36.0)
MCV: 95.9 fl (ref 78.0–100.0)
Monocytes Absolute: 1.1 10*3/uL — ABNORMAL HIGH (ref 0.1–1.0)
Monocytes Relative: 10.2 % (ref 3.0–12.0)
Neutro Abs: 7.3 10*3/uL (ref 1.4–7.7)
Neutrophils Relative %: 68.9 % (ref 43.0–77.0)
Platelets: 326 10*3/uL (ref 150.0–400.0)
RBC: 3.66 Mil/uL — ABNORMAL LOW (ref 4.22–5.81)
RDW: 19 % — ABNORMAL HIGH (ref 11.5–15.5)
WBC: 10.6 10*3/uL — ABNORMAL HIGH (ref 4.0–10.5)

## 2019-05-11 LAB — COMPREHENSIVE METABOLIC PANEL
ALT: 16 U/L (ref 0–53)
AST: 17 U/L (ref 0–37)
Albumin: 2.9 g/dL — ABNORMAL LOW (ref 3.5–5.2)
Alkaline Phosphatase: 72 U/L (ref 39–117)
BUN: 12 mg/dL (ref 6–23)
CO2: 26 mEq/L (ref 19–32)
Calcium: 7.7 mg/dL — ABNORMAL LOW (ref 8.4–10.5)
Chloride: 107 mEq/L (ref 96–112)
Creatinine, Ser: 1.25 mg/dL (ref 0.40–1.50)
GFR: 59.31 mL/min — ABNORMAL LOW (ref >60.00)
Glucose, Bld: 89 mg/dL (ref 70–99)
Potassium: 3 mEq/L — ABNORMAL LOW (ref 3.5–5.1)
Sodium: 139 mEq/L (ref 135–145)
Total Bilirubin: 0.8 mg/dL (ref 0.2–1.2)
Total Protein: 5.6 g/dL — ABNORMAL LOW (ref 6.0–8.3)

## 2019-05-11 LAB — VITAMIN B12: Vitamin B-12: 735 pg/mL (ref 211–911)

## 2019-05-11 LAB — VITAMIN D 25 HYDROXY (VIT D DEFICIENCY, FRACTURES): VITD: 19.98 ng/mL — ABNORMAL LOW (ref 30.00–100.00)

## 2019-05-16 ENCOUNTER — Other Ambulatory Visit: Payer: Self-pay

## 2019-05-16 MED ORDER — TRAMADOL HCL 50 MG PO TABS: 50.0000 mg | ORAL_TABLET | Freq: Three times a day (TID) | ORAL | 1 refills | Status: DC | PRN

## 2019-05-16 NOTE — Progress Notes (Signed)
Sent script to [facility]. Called to verify receipt. They did not receive so called in script verbally.

## 2019-05-21 LAB — CALPROTECTIN, FECAL: Calprotectin, Fecal: 356 ug/g — ABNORMAL HIGH (ref 0–120)

## 2019-05-25 ENCOUNTER — Other Ambulatory Visit: Payer: Self-pay

## 2019-05-25 DIAGNOSIS — K5080 Crohn's disease of both small and large intestine without complications: Secondary | ICD-10-CM

## 2019-06-09 ENCOUNTER — Ambulatory Visit (INDEPENDENT_AMBULATORY_CARE_PROVIDER_SITE_OTHER): Payer: Medicare Other | Admitting: Gastroenterology

## 2019-06-09 DIAGNOSIS — E538 Deficiency of other specified B group vitamins: Secondary | ICD-10-CM

## 2019-06-20 ENCOUNTER — Telehealth: Payer: Self-pay

## 2019-06-20 NOTE — Telephone Encounter (Signed)
Got this phone message forward from you but nothing listed. Is there anything you need? Thanks

## 2019-06-28 ENCOUNTER — Other Ambulatory Visit: Payer: Self-pay

## 2019-06-28 ENCOUNTER — Encounter: Payer: Self-pay | Admitting: Family Medicine

## 2019-06-28 MED ORDER — TRAMADOL HCL 50 MG PO TABS: 50.0000 mg | ORAL_TABLET | Freq: Three times a day (TID) | ORAL | 1 refills | Status: DC | PRN

## 2019-07-10 ENCOUNTER — Other Ambulatory Visit: Payer: Self-pay

## 2019-07-10 ENCOUNTER — Ambulatory Visit (INDEPENDENT_AMBULATORY_CARE_PROVIDER_SITE_OTHER): Payer: Medicare Other | Admitting: Gastroenterology

## 2019-07-10 ENCOUNTER — Telehealth: Payer: Self-pay

## 2019-07-10 ENCOUNTER — Other Ambulatory Visit: Payer: Medicare Other

## 2019-07-10 DIAGNOSIS — E538 Deficiency of other specified B group vitamins: Secondary | ICD-10-CM

## 2019-07-10 DIAGNOSIS — K50019 Crohn's disease of small intestine with unspecified complications: Secondary | ICD-10-CM

## 2019-07-10 DIAGNOSIS — Z933 Colostomy status: Secondary | ICD-10-CM

## 2019-07-10 DIAGNOSIS — K5080 Crohn's disease of both small and large intestine without complications: Secondary | ICD-10-CM

## 2019-07-10 NOTE — Telephone Encounter (Signed)
Called and spoke to patient. He said he has to wait for the Stelara to be delivered tomorrow and then he might come in and get the lab work done before he administers it.

## 2019-07-10 NOTE — Telephone Encounter (Signed)
-----  Message from Stephanie S Stevens sent at 07/10/2019  2:46 PM EST ----- ,   Please call this patient. Patient had a nurse visit today and was told to pick up a kit. We could not find anything up here at the front desk. Patient left without picking anything up.  Thanks Steph  

## 2019-07-12 DIAGNOSIS — K50819 Crohn's disease of both small and large intestine with unspecified complications: Secondary | ICD-10-CM | POA: Diagnosis not present

## 2019-08-02 ENCOUNTER — Other Ambulatory Visit: Payer: Self-pay

## 2019-08-02 MED ORDER — TRAMADOL HCL 50 MG PO TABS: 50.0000 mg | ORAL_TABLET | Freq: Three times a day (TID) | ORAL | 1 refills | Status: DC | PRN

## 2019-08-05 ENCOUNTER — Other Ambulatory Visit: Payer: Self-pay | Admitting: Gastroenterology

## 2019-08-07 ENCOUNTER — Other Ambulatory Visit: Payer: Self-pay

## 2019-08-09 ENCOUNTER — Telehealth: Payer: Self-pay

## 2019-08-09 NOTE — Telephone Encounter (Signed)
Patient came in for monthly B12 injection (1,069mq) Gave IM injection in patient's left deltoid without any complications

## 2019-08-16 ENCOUNTER — Telehealth: Payer: Self-pay | Admitting: Gastroenterology

## 2019-08-16 NOTE — Telephone Encounter (Signed)
Gregory [name] can you touch base with the patient regarding the following: -We checked his Stelara levels.  His trough level is 1.2 with no antibody detected.  His fecal calprotectin is elevated and I am concerned he may have active inflammation at this dose.  For maintenance therapy had like his level between 3 and 5.  In this light I am hoping we could increase his dose to every 4 weeks and see if his insurance would approve that.  Could you look into that and see if the patient will be willing to do this if approved.  Thanks

## 2019-08-17 NOTE — Telephone Encounter (Signed)
Called patient and gave results of his Stelara trough level. Let him know Dr. Havery Moros would like him to increase it to every 4 weeks, if we can get it approved by his Ins. He is ok with that. I faxed application with request to [facility].

## 2019-08-17 NOTE — Telephone Encounter (Signed)
Great thanks [employer]

## 2019-09-18 ENCOUNTER — Other Ambulatory Visit: Payer: Self-pay

## 2019-09-18 MED ORDER — TRAMADOL HCL 50 MG PO TABS: 50.0000 mg | ORAL_TABLET | Freq: Three times a day (TID) | ORAL | 0 refills | Status: DC | PRN

## 2019-10-20 ENCOUNTER — Other Ambulatory Visit: Payer: Self-pay

## 2019-10-20 ENCOUNTER — Telehealth: Payer: Self-pay

## 2019-10-20 MED ORDER — TRAMADOL HCL 50 MG PO TABS: 50.0000 mg | ORAL_TABLET | Freq: Three times a day (TID) | ORAL | 0 refills | Status: DC | PRN

## 2019-10-20 NOTE — Telephone Encounter (Signed)
Faxed order to patient's pharmacy for Tramadol-77m Q 8 hours PRN. 90 ct/no refills

## 2019-10-31 ENCOUNTER — Other Ambulatory Visit: Payer: Self-pay | Admitting: Gastroenterology

## 2019-10-31 ENCOUNTER — Other Ambulatory Visit: Payer: Self-pay

## 2019-10-31 NOTE — Telephone Encounter (Signed)
OK to refill? He needs an OV as well. Last seen in 04-2019.

## 2019-10-31 NOTE — Telephone Encounter (Signed)
Yes okay to refill  

## 2019-11-17 ENCOUNTER — Other Ambulatory Visit: Payer: Self-pay

## 2019-11-17 ENCOUNTER — Telehealth: Payer: Self-pay

## 2019-11-17 DIAGNOSIS — K5080 Crohn's disease of both small and large intestine without complications: Secondary | ICD-10-CM

## 2019-11-17 NOTE — Telephone Encounter (Signed)
Called patient and let him know Dr. Havery Moros encourages him to get the COVID vaccines. Scheduled office visit with Dr. Havery Moros on 12/28/19 and patient will get his labs the same time. CBC & CMET. (He lives out of town and difficult to get here)

## 2019-12-05 DIAGNOSIS — Z23 Encounter for immunization: Secondary | ICD-10-CM | POA: Diagnosis not present

## 2019-12-15 ENCOUNTER — Other Ambulatory Visit: Payer: Self-pay | Admitting: Gastroenterology

## 2019-12-18 ENCOUNTER — Telehealth: Payer: Self-pay | Admitting: Gastroenterology

## 2019-12-18 NOTE — Telephone Encounter (Signed)
Sorry to hear that about this encounter. He must really not be feeling well to act like that and agree he should go to the ED if he is in that much distress, thanks for letting me know

## 2019-12-18 NOTE — Telephone Encounter (Signed)
Patient called back and was very rude, argumentative an calling me a liar and stupid.  He states that I did not call him back and that I am lying.  His phone on the caller ID coming to my display says that he is Gregory [name], he says that that is not the name of his phone and he continued to yell at me and call me stupid.  I told the patient that he needed to look at his mychart message that he sent and I answered.  He is advised again to go to the ED for evaluation and fluids.  I told the patient that I would not speak to him any longer if he continued to be abusive.  He hung up.

## 2019-12-18 NOTE — Telephone Encounter (Signed)
Left message for patient to call back  

## 2019-12-18 NOTE — Telephone Encounter (Signed)
Patient called states he is still feeling really bad and feels like he is going to pass out. Does not know what's wrong please advise

## 2019-12-19 ENCOUNTER — Inpatient Hospital Stay (HOSPITAL_COMMUNITY)
Admission: EM | Admit: 2019-12-19 | Discharge: 2019-12-22 | DRG: 386 | Disposition: A | Discharge status: Home Health | Payer: Medicare Other | Attending: Internal Medicine | Admitting: Internal Medicine

## 2019-12-19 ENCOUNTER — Encounter (HOSPITAL_COMMUNITY): Payer: Self-pay

## 2019-12-19 ENCOUNTER — Other Ambulatory Visit: Payer: Self-pay

## 2019-12-19 ENCOUNTER — Emergency Department (HOSPITAL_COMMUNITY): Payer: Medicare Other

## 2019-12-19 DIAGNOSIS — Z833 Family history of diabetes mellitus: Secondary | ICD-10-CM | POA: Diagnosis not present

## 2019-12-19 DIAGNOSIS — Z8379 Family history of other diseases of the digestive system: Secondary | ICD-10-CM

## 2019-12-19 DIAGNOSIS — K50819 Crohn's disease of both small and large intestine with unspecified complications: Secondary | ICD-10-CM | POA: Diagnosis not present

## 2019-12-19 DIAGNOSIS — Z79899 Other long term (current) drug therapy: Secondary | ICD-10-CM

## 2019-12-19 DIAGNOSIS — G8929 Other chronic pain: Secondary | ICD-10-CM | POA: Diagnosis present

## 2019-12-19 DIAGNOSIS — Z03818 Encounter for observation for suspected exposure to other biological agents ruled out: Secondary | ICD-10-CM | POA: Diagnosis not present

## 2019-12-19 DIAGNOSIS — Z7952 Long term (current) use of systemic steroids: Secondary | ICD-10-CM

## 2019-12-19 DIAGNOSIS — E440 Moderate protein-calorie malnutrition: Secondary | ICD-10-CM | POA: Diagnosis present

## 2019-12-19 DIAGNOSIS — E872 Acidosis: Secondary | ICD-10-CM | POA: Diagnosis not present

## 2019-12-19 DIAGNOSIS — Z79891 Long term (current) use of opiate analgesic: Secondary | ICD-10-CM

## 2019-12-19 DIAGNOSIS — K0889 Other specified disorders of teeth and supporting structures: Secondary | ICD-10-CM | POA: Diagnosis present

## 2019-12-19 DIAGNOSIS — K5000 Crohn's disease of small intestine without complications: Secondary | ICD-10-CM | POA: Diagnosis not present

## 2019-12-19 DIAGNOSIS — E876 Hypokalemia: Secondary | ICD-10-CM | POA: Diagnosis present

## 2019-12-19 DIAGNOSIS — E538 Deficiency of other specified B group vitamins: Secondary | ICD-10-CM | POA: Diagnosis present

## 2019-12-19 DIAGNOSIS — Z87442 Personal history of urinary calculi: Secondary | ICD-10-CM

## 2019-12-19 DIAGNOSIS — K50812 Crohn's disease of both small and large intestine with intestinal obstruction: Principal | ICD-10-CM | POA: Diagnosis present

## 2019-12-19 DIAGNOSIS — Z808 Family history of malignant neoplasm of other organs or systems: Secondary | ICD-10-CM | POA: Diagnosis not present

## 2019-12-19 DIAGNOSIS — R112 Nausea with vomiting, unspecified: Secondary | ICD-10-CM | POA: Diagnosis not present

## 2019-12-19 DIAGNOSIS — Z880 Allergy status to penicillin: Secondary | ICD-10-CM

## 2019-12-19 DIAGNOSIS — Z87891 Personal history of nicotine dependence: Secondary | ICD-10-CM | POA: Diagnosis not present

## 2019-12-19 DIAGNOSIS — Z20822 Contact with and (suspected) exposure to covid-19: Secondary | ICD-10-CM | POA: Diagnosis present

## 2019-12-19 DIAGNOSIS — E860 Dehydration: Principal | ICD-10-CM | POA: Diagnosis present

## 2019-12-19 DIAGNOSIS — K912 Postsurgical malabsorption, not elsewhere classified: Secondary | ICD-10-CM | POA: Diagnosis present

## 2019-12-19 DIAGNOSIS — N179 Acute kidney failure, unspecified: Secondary | ICD-10-CM | POA: Diagnosis present

## 2019-12-19 DIAGNOSIS — E46 Unspecified protein-calorie malnutrition: Secondary | ICD-10-CM | POA: Diagnosis present

## 2019-12-19 DIAGNOSIS — R1084 Generalized abdominal pain: Secondary | ICD-10-CM | POA: Diagnosis not present

## 2019-12-19 DIAGNOSIS — R531 Weakness: Secondary | ICD-10-CM | POA: Diagnosis not present

## 2019-12-19 DIAGNOSIS — Z681 Body mass index (BMI) 19 or less, adult: Secondary | ICD-10-CM

## 2019-12-19 DIAGNOSIS — Z9049 Acquired absence of other specified parts of digestive tract: Secondary | ICD-10-CM

## 2019-12-19 DIAGNOSIS — K219 Gastro-esophageal reflux disease without esophagitis: Secondary | ICD-10-CM | POA: Diagnosis present

## 2019-12-19 DIAGNOSIS — E43 Unspecified severe protein-calorie malnutrition: Secondary | ICD-10-CM | POA: Diagnosis not present

## 2019-12-19 DIAGNOSIS — F329 Major depressive disorder, single episode, unspecified: Secondary | ICD-10-CM | POA: Diagnosis present

## 2019-12-19 DIAGNOSIS — E559 Vitamin D deficiency, unspecified: Secondary | ICD-10-CM | POA: Diagnosis present

## 2019-12-19 DIAGNOSIS — Z933 Colostomy status: Secondary | ICD-10-CM | POA: Diagnosis not present

## 2019-12-19 DIAGNOSIS — Z888 Allergy status to other drugs, medicaments and biological substances status: Secondary | ICD-10-CM

## 2019-12-19 DIAGNOSIS — Z8249 Family history of ischemic heart disease and other diseases of the circulatory system: Secondary | ICD-10-CM | POA: Diagnosis not present

## 2019-12-19 LAB — RESPIRATORY PANEL BY RT PCR (FLU A&B, COVID)
Influenza A by PCR: NEGATIVE
Influenza B by PCR: NEGATIVE
SARS Coronavirus 2 by RT PCR: NEGATIVE

## 2019-12-19 LAB — BASIC METABOLIC PANEL
Anion gap: 11 (ref 5–15)
BUN: 35 mg/dL — ABNORMAL HIGH (ref 6–20)
CO2: 14 mmol/L — ABNORMAL LOW (ref 22–32)
Calcium: 7.6 mg/dL — ABNORMAL LOW (ref 8.9–10.3)
Chloride: 111 mmol/L (ref 98–111)
Creatinine, Ser: 2.41 mg/dL — ABNORMAL HIGH (ref 0.61–1.24)
GFR calc Af Amer: 33 mL/min — ABNORMAL LOW (ref >60)
GFR calc non Af Amer: 29 mL/min — ABNORMAL LOW (ref >60)
Glucose, Bld: 131 mg/dL — ABNORMAL HIGH (ref 70–99)
Potassium: 3.1 mmol/L — ABNORMAL LOW (ref 3.5–5.1)
Sodium: 136 mmol/L (ref 135–145)

## 2019-12-19 LAB — URINALYSIS, ROUTINE W REFLEX MICROSCOPIC
Bilirubin Urine: NEGATIVE
Glucose, UA: NEGATIVE mg/dL
Hgb urine dipstick: NEGATIVE
Ketones, ur: NEGATIVE mg/dL
Leukocytes,Ua: NEGATIVE
Nitrite: NEGATIVE
Protein, ur: NEGATIVE mg/dL
Specific Gravity, Urine: 1.008 (ref 1.005–1.030)
pH: 6 (ref 5.0–8.0)

## 2019-12-19 LAB — PHOSPHORUS: Phosphorus: 4.5 mg/dL (ref 2.5–4.6)

## 2019-12-19 LAB — COMPREHENSIVE METABOLIC PANEL
ALT: 15 U/L (ref 0–44)
AST: 21 U/L (ref 15–41)
Albumin: 4.1 g/dL (ref 3.5–5.0)
Alkaline Phosphatase: 112 U/L (ref 38–126)
Anion gap: 13 (ref 5–15)
BUN: 41 mg/dL — ABNORMAL HIGH (ref 6–20)
CO2: 19 mmol/L — ABNORMAL LOW (ref 22–32)
Calcium: 8.8 mg/dL — ABNORMAL LOW (ref 8.9–10.3)
Chloride: 100 mmol/L (ref 98–111)
Creatinine, Ser: 3.12 mg/dL — ABNORMAL HIGH (ref 0.61–1.24)
GFR calc Af Amer: 24 mL/min — ABNORMAL LOW (ref >60)
GFR calc non Af Amer: 21 mL/min — ABNORMAL LOW (ref >60)
Glucose, Bld: 126 mg/dL — ABNORMAL HIGH (ref 70–99)
Potassium: 3.2 mmol/L — ABNORMAL LOW (ref 3.5–5.1)
Sodium: 132 mmol/L — ABNORMAL LOW (ref 135–145)
Total Bilirubin: 1.3 mg/dL — ABNORMAL HIGH (ref 0.3–1.2)
Total Protein: 8.1 g/dL (ref 6.5–8.1)

## 2019-12-19 LAB — CBC
HCT: 49.7 % (ref 39.0–52.0)
Hemoglobin: 16.6 g/dL (ref 13.0–17.0)
MCH: 29.5 pg (ref 26.0–34.0)
MCHC: 33.4 g/dL (ref 30.0–36.0)
MCV: 88.4 fL (ref 80.0–100.0)
Platelets: 325 10*3/uL (ref 150–400)
RBC: 5.62 MIL/uL (ref 4.22–5.81)
RDW: 14.4 % (ref 11.5–15.5)
WBC: 15.7 10*3/uL — ABNORMAL HIGH (ref 4.0–10.5)
nRBC: 0 % (ref 0.0–0.2)

## 2019-12-19 LAB — CK: Total CK: 80 U/L (ref 49–397)

## 2019-12-19 LAB — LACTIC ACID, PLASMA: Lactic Acid, Venous: 1.8 mmol/L (ref 0.5–1.9)

## 2019-12-19 LAB — LIPASE, BLOOD: Lipase: 58 U/L — ABNORMAL HIGH (ref 11–51)

## 2019-12-19 LAB — MAGNESIUM: Magnesium: 1.7 mg/dL (ref 1.7–2.4)

## 2019-12-19 MED ORDER — METHYLPREDNISOLONE SODIUM SUCC 125 MG IJ SOLR
125.0000 mg | Freq: Four times a day (QID) | INTRAMUSCULAR | Status: DC
  Administered 2019-12-19 – 2019-12-20 (×3): 125 mg via INTRAVENOUS
  Filled 2019-12-19 (×3): qty 2

## 2019-12-19 MED ORDER — POTASSIUM CHLORIDE CRYS ER 20 MEQ PO TBCR
40.0000 meq | EXTENDED_RELEASE_TABLET | Freq: Once | ORAL | Status: AC
  Administered 2019-12-19: 40 meq via ORAL
  Filled 2019-12-19: qty 2

## 2019-12-19 MED ORDER — ONDANSETRON HCL 4 MG PO TABS: 4.0000 mg | ORAL_TABLET | Freq: Four times a day (QID) | ORAL | Status: DC | PRN

## 2019-12-19 MED ORDER — SODIUM CHLORIDE 0.9 % IV BOLUS (SEPSIS)
2000.0000 mL | Freq: Once | INTRAVENOUS | Status: AC
  Administered 2019-12-19: 2000 mL via INTRAVENOUS

## 2019-12-19 MED ORDER — METHYLPREDNISOLONE SODIUM SUCC 125 MG IJ SOLR
125.0000 mg | Freq: Once | INTRAMUSCULAR | Status: AC
  Administered 2019-12-19: 125 mg via INTRAVENOUS
  Filled 2019-12-19: qty 2

## 2019-12-19 MED ORDER — SODIUM CHLORIDE 0.9% FLUSH: 3.0000 mL | Freq: Two times a day (BID) | INTRAVENOUS | Status: DC

## 2019-12-19 MED ORDER — MAGNESIUM SULFATE IN D5W 1-5 GM/100ML-% IV SOLN
1.0000 g | Freq: Once | INTRAVENOUS | Status: AC
  Administered 2019-12-19: 1 g via INTRAVENOUS
  Filled 2019-12-19: qty 100

## 2019-12-19 MED ORDER — METRONIDAZOLE IN NACL 5-0.79 MG/ML-% IV SOLN
500.0000 mg | Freq: Once | INTRAVENOUS | Status: AC
  Administered 2019-12-19: 500 mg via INTRAVENOUS
  Filled 2019-12-19: qty 100

## 2019-12-19 MED ORDER — TRAMADOL HCL 50 MG PO TABS: 50.0000 mg | ORAL_TABLET | Freq: Three times a day (TID) | ORAL | Status: DC | PRN

## 2019-12-19 MED ORDER — DIPHENOXYLATE-ATROPINE 2.5-0.025 MG PO TABS: 1.0000 | ORAL_TABLET | Freq: Four times a day (QID) | ORAL | Status: DC | PRN

## 2019-12-19 MED ORDER — CIPROFLOXACIN IN D5W 200 MG/100ML IV SOLN
200.0000 mg | INTRAVENOUS | Status: DC
  Filled 2019-12-19: qty 100

## 2019-12-19 MED ORDER — ACETAMINOPHEN 325 MG PO TABS
650.0000 mg | ORAL_TABLET | Freq: Four times a day (QID) | ORAL | Status: DC | PRN
  Administered 2019-12-19 – 2019-12-20 (×2): 650 mg via ORAL
  Filled 2019-12-19 (×2): qty 2

## 2019-12-19 MED ORDER — SODIUM CHLORIDE 0.9% FLUSH: 3.0000 mL | Freq: Once | INTRAVENOUS | Status: DC

## 2019-12-19 MED ORDER — ACETAMINOPHEN 650 MG RE SUPP: 650.0000 mg | Freq: Four times a day (QID) | RECTAL | Status: DC | PRN

## 2019-12-19 MED ORDER — SODIUM CHLORIDE 0.9 % IV SOLN: INTRAVENOUS | Status: DC

## 2019-12-19 MED ORDER — METRONIDAZOLE IN NACL 5-0.79 MG/ML-% IV SOLN
500.0000 mg | Freq: Three times a day (TID) | INTRAVENOUS | Status: DC
  Administered 2019-12-20 (×2): 500 mg via INTRAVENOUS
  Filled 2019-12-19 (×2): qty 100

## 2019-12-19 MED ORDER — ONDANSETRON HCL 4 MG/2ML IJ SOLN
4.0000 mg | Freq: Once | INTRAMUSCULAR | Status: AC
  Administered 2019-12-19: 4 mg via INTRAVENOUS
  Filled 2019-12-19: qty 2

## 2019-12-19 MED ORDER — CIPROFLOXACIN IN D5W 400 MG/200ML IV SOLN
400.0000 mg | Freq: Once | INTRAVENOUS | Status: AC
  Administered 2019-12-19: 400 mg via INTRAVENOUS
  Filled 2019-12-19: qty 200

## 2019-12-19 MED ORDER — POTASSIUM CHLORIDE 10 MEQ/100ML IV SOLN
10.0000 meq | INTRAVENOUS | Status: AC
  Administered 2019-12-20 (×2): 10 meq via INTRAVENOUS
  Filled 2019-12-19 (×2): qty 100

## 2019-12-19 MED ORDER — POTASSIUM CHLORIDE 10 MEQ/100ML IV SOLN
10.0000 meq | INTRAVENOUS | Status: AC
  Administered 2019-12-19 (×2): 10 meq via INTRAVENOUS
  Filled 2019-12-19 (×2): qty 100

## 2019-12-19 MED ORDER — SODIUM CHLORIDE 0.9 % IV BOLUS
1000.0000 mL | Freq: Once | INTRAVENOUS | Status: AC
  Administered 2019-12-19: 1000 mL via INTRAVENOUS

## 2019-12-19 MED ORDER — ONDANSETRON HCL 4 MG/2ML IJ SOLN
4.0000 mg | Freq: Four times a day (QID) | INTRAMUSCULAR | Status: DC | PRN
  Administered 2019-12-19 – 2019-12-21 (×2): 4 mg via INTRAVENOUS
  Filled 2019-12-19 (×2): qty 2

## 2019-12-19 NOTE — ED Provider Notes (Addendum)
[facility] DEPT Provider Note   CSN: [id number] Arrival date & time: 12/19/19  1357     History No chief complaint on file.   Gregory [name]. is a 59 y.o. male.  Patient presents with 4 days of vomiting and some loose stools.  Patient has Crohn's disease and has a subtotal colectomy.  He is followed by the [facility]  The history is provided by the patient. No language interpreter was used.  Emesis Severity:  Moderate Timing:  Constant Quality:  Bilious material Able to tolerate:  Liquids Progression:  Worsening Chronicity:  Recurrent Recent urination:  Decreased Context: not post-tussive   Relieved by:  Nothing Worsened by:  Nothing Ineffective treatments:  None tried Associated symptoms: abdominal pain   Associated symptoms: no cough, no diarrhea and no headaches        Past Medical History:  Diagnosis Date  . Acute endocarditis   . Crohn's disease ([place])   . Depression   . Eating disorder   . GERD (gastroesophageal reflux disease)   . Kidney stones   . Short gut syndrome     Patient Active Problem List   Diagnosis Date Noted  . Crohn's disease of both small and lg int w unsp comp ([place]) 01/17/2018  . Dehydration 01/17/2018  . Hypokalemia 09/15/2017  . AKI (acute kidney injury) ([place]) 09/15/2017  . Hypomagnesemia 09/15/2017  . Anemia 05/18/2017  . Protein-calorie malnutrition, severe 04/30/2017  . Anuresis 04/28/2017  . Physical deconditioning 04/28/2017  . Protein calorie malnutrition ([place]) 04/28/2017  . Abdominal pain, left upper quadrant 04/28/2017  . GERD (gastroesophageal reflux disease) 04/28/2017  . Nephrostomy complication ([place]) 45/80/9983  . Depression 04/28/2017  . Nausea & vomiting   . Crohn's disease of both small and large intestine without complication ([place])   . Nephrolithiasis 04/10/2017    Past Surgical History:  Procedure Laterality Date  . COLONOSCOPY WITH PROPOFOL N/A 09/17/2017   Procedure:  COLONOSCOPY WITH PROPOFOL;  Surgeon: [name], MD;  Location: [facility];  Service: Endoscopy;  Laterality: N/A;  Through ileostomy.  [name] WITH STENT PLACEMENT Left 05/20/2017   Procedure: CYSTOSCOPY WITH STENT PLACEMENT;  Surgeon: [name], MD;  Location: [facility];  Service: Urology;  Laterality: Left;  . ILEOSTOMY    . IR NEPHROSTOMY EXCHANGE LEFT  04/12/2017  . IR NEPHROSTOMY PLACEMENT LEFT  04/12/2017  . IR NEPHROSTOMY PLACEMENT LEFT  04/29/2017  . NEPHROLITHOTOMY Left 05/14/2017   Procedure: LEFT NEPHROLITHOTOMY PERCUTANEOUS WITH SURGEON ACCESS;  Surgeon: [name], MD;  Location: [facility];  Service: Urology;  Laterality: Left;  . SMALL INTESTINE SURGERY    . SUBTOTAL COLECTOMY         Family History  Problem Relation Age of Onset  . Heart disease Mother   . Diabetes Father   . Heart disease Father   . Heart attack Father   . Melanoma Sister   . Crohn's disease Brother   . Inflammatory bowel disease Brother   . Diabetes Paternal Grandmother   . Colon cancer Neg Hx   . Rectal cancer Neg Hx   . Stomach cancer Neg Hx   . Esophageal cancer Neg Hx     Social History   Tobacco Use  . Smoking status: Former Smoker    Packs/day: 1.00    Years: 20.00    Pack years: 20.00    Types: Cigarettes    Quit date: 08/31/2006    Years since quitting: 13.3  .  Smokeless tobacco: Never Used  Substance Use Topics  . Alcohol use: No  . Drug use: No    Home Medications Prior to Admission medications   Medication Sig Start Date End Date Taking? Authorizing Provider  acetaminophen (TYLENOL) 500 MG tablet Take 500 mg by mouth every 6 (six) hours as needed for moderate pain.   Yes [provider]  diphenoxylate-atropine (LOMOTIL) 2.5-0.025 MG tablet TAKE 1 TABLET 4 TIMES A DAY AS NEEDED FOR DIARRHEA OR LOOSE STOOLS Patient taking differently: Take 1 tablet by mouth 4 (four) times daily as needed for diarrhea or loose stools.  12/15/19  Yes  Armbruster, Carlota Raspberry, MD  Multiple Vitamin (MULTIVITAMIN WITH MINERALS) TABS tablet Take 1 tablet by mouth daily. 05/03/17  Yes Rai, Ripudeep K, MD  ondansetron (ZOFRAN-ODT) 4 MG disintegrating tablet TAKE ONE TABLET BY MOUTH EVERY 8 HOURS AS NEEDED FOR NAUSEA AND VOMITING Patient taking differently: Take 4 mg by mouth every 8 (eight) hours as needed for nausea or vomiting.  10/12/18  Yes Armbruster, Carlota Raspberry, MD  potassium chloride (KLOR-CON) 20 MEQ packet Take 20 mEq by mouth daily. Patient taking differently: Take 20 mEq by mouth See admin instructions. Patient is taking one on Monday and one on Thursday 01/30/19  Yes Armbruster, Carlota Raspberry, MD  sertraline (ZOLOFT) 50 MG tablet Take 1 tablet (50 mg total) by mouth daily. Patient taking differently: Take 50 mg by mouth daily as needed (down spells?).  08/26/17  Yes Billie Ruddy, MD  traMADol (ULTRAM) 50 MG tablet Take 1 tablet (50 mg total) by mouth every 8 (eight) hours as needed. 10/20/19  Yes Armbruster, Carlota Raspberry, MD  ustekinumab (STELARA) 90 MG/ML SOSY injection Inject 1 mL (90 mg total) into the skin every 8 (eight) weeks. Patient taking differently: Inject 90 mg into the skin every 30 (thirty) days.  11/07/18  Yes Armbruster, Carlota Raspberry, MD  Vitamin D, Ergocalciferol, 2000 units CAPS Take 1 capsule by mouth daily.   Yes [provider]  cholestyramine light (PREVALITE) 4 g packet Take 1 packet (4 g total) by mouth daily. Patient not taking: Reported on 12/19/2019 01/21/18   Lavina Hamman, MD  cyanocobalamin (,VITAMIN B-12,) 1000 MCG/ML injection Inject 1 mL (1,000 mcg total) into the muscle once a week. Inject once a wk. for 4 weeks, then once a month Patient not taking: Reported on 12/19/2019 04/10/19   Yetta Flock, MD  ferrous sulfate 325 (65 FE) MG tablet TAKE 1 TABLET (325 MG TOTAL) 2 (TWO) TIMES DAILY WITH A MEAL BY MOUTH. Patient not taking: Reported on 12/19/2019 11/15/17   Yetta Flock, MD  ipratropium (ATROVENT) 0.03  % nasal spray Place 2 sprays 2 (two) times daily into both nostrils. Can use 3 times a day as needed Patient not taking: Reported on 12/19/2019 07/12/17   Yetta Flock, MD  ondansetron (ZOFRAN) 4 MG tablet Take 1 tablet every 8 (eight) hours as needed for nausea or vomiting. Patient not taking: Reported on 12/19/2019 12/15/19   Yetta Flock, MD    Allergies    Methotrexate derivatives, Phenergan [promethazine hcl], Humira [adalimumab], Penicillins, and Remicade [infliximab]  Review of Systems   Review of Systems  Constitutional: Negative for appetite change and fatigue.  HENT: Negative for congestion, ear discharge and sinus pressure.   Eyes: Negative for discharge.  Respiratory: Negative for cough.   Cardiovascular: Negative for chest pain.  Gastrointestinal: Positive for abdominal pain and vomiting. Negative for diarrhea.  Genitourinary: Negative  for frequency and hematuria.  Musculoskeletal: Negative for back pain.  Skin: Negative for rash.  Neurological: Negative for seizures and headaches.  Psychiatric/Behavioral: Negative for hallucinations.    Physical Exam Updated Vital Signs BP 92/60   Pulse 62   Temp 97.8 F (36.6 C)   Resp 14   Ht 5' 11"  (1.803 m)   Wt 53.6 kg   SpO2 100%   BMI 16.49 kg/m   Physical Exam Vitals and nursing note reviewed.  Constitutional:      Appearance: He is well-developed.  HENT:     Head: Normocephalic.     Nose: Nose normal.  Eyes:     General: No scleral icterus.    Conjunctiva/sclera: Conjunctivae normal.  Neck:     Thyroid: No thyromegaly.  Cardiovascular:     Rate and Rhythm: Normal rate and regular rhythm.     Heart sounds: No murmur. No friction rub. No gallop.   Pulmonary:     Breath sounds: No stridor. No wheezing or rales.  Chest:     Chest wall: No tenderness.  Abdominal:     General: There is no distension.     Tenderness: There is abdominal tenderness. There is no rebound.  Musculoskeletal:         General: Normal range of motion.     Cervical back: Neck supple.  Lymphadenopathy:     Cervical: No cervical adenopathy.  Skin:    Findings: No erythema or rash.  Neurological:     Mental Status: He is alert and oriented to person, place, and time.     Motor: No abnormal muscle tone.     Coordination: Coordination normal.  Psychiatric:        Behavior: Behavior normal.     ED Results / Procedures / Treatments   Labs (all labs ordered are listed, but only abnormal results are displayed) Labs Reviewed  LIPASE, BLOOD - Abnormal; Notable for the following components:      Result Value   Lipase 58 (*)    All other components within normal limits  COMPREHENSIVE METABOLIC PANEL - Abnormal; Notable for the following components:   Sodium 132 (*)    Potassium 3.2 (*)    CO2 19 (*)    Glucose, Bld 126 (*)    BUN 41 (*)    Creatinine, Ser 3.12 (*)    Calcium 8.8 (*)    Total Bilirubin 1.3 (*)    GFR calc non Af Amer 21 (*)    GFR calc Af Amer 24 (*)    All other components within normal limits  CBC - Abnormal; Notable for the following components:   WBC 15.7 (*)    All other components within normal limits  RESPIRATORY PANEL BY RT PCR (FLU A&B, COVID)  URINALYSIS, ROUTINE W REFLEX MICROSCOPIC  LACTIC ACID, PLASMA    EKG None  Radiology CT ABDOMEN PELVIS WO CONTRAST  Result Date: 12/19/2019 CLINICAL DATA:  Acute, generalized abdominal pain. Neutropenia. 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 History of Crohn's disease. EXAM: CT ABDOMEN AND PELVIS WITHOUT CONTRAST TECHNIQUE: Multidetector CT imaging of the abdomen and pelvis was performed following the standard protocol without IV contrast. COMPARISON:  01/17/2018. FINDINGS: Lower chest: Pronounced bullous changes in the inferior left upper lobe. Linear scarring in the left lower lobe. Normal sized heart. Hepatobiliary: No focal liver abnormality is seen. Status post  cholecystectomy. No biliary dilatation. Pancreas: Unremarkable. No pancreatic ductal  dilatation or surrounding inflammatory changes. Spleen: Normal in size without focal abnormality. Adrenals/Urinary Tract: Adrenal glands are unremarkable. Kidneys are normal, without renal calculi, focal lesion, or hydronephrosis. Bladder is unremarkable. Stomach/Bowel: Unremarkable stomach and small bowel. Again demonstrated are changes of a subtotal colectomy a Hartmann's pouch. The previously demonstrated loop of ileum in the left upper and in the mid pelvis is less dilated than previously seen. There is progressive associated wall thickening, currently measuring 8 mm in maximum thickness. This is distal to an anastomosis in the left mid abdomen. Bowel at the anastomosis remains dilated without wall thickening. No inflammatory soft tissue stranding is seen. Vascular/Lymphatic: No significant vascular findings are present. No enlarged abdominal or pelvic lymph nodes. Reproductive: Prostate is unremarkable. Other: Left anterior mid pelvic ostomy. Musculoskeletal: Mild lumbar and lower thoracic spine degenerative changes. IMPRESSION: 1. Progressive wall thickening involving a loop of ileum in the left upper and mid pelvis, distal to an anastomosis in the left mid abdomen. This is compatible mildly progressive thickening associated with the patient's known Crohn's disease. A neoplastic process can not be excluded but is less likely. 2. 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 3. Pronounced bullous changes in the inferior left upper lobe. Electronically Signed   By: Claudie Revering M.D.   On: 12/19/2019 17:21  CRITICAL CARE Performed by: Milton Ferguson Total critical care time:52mnutes Critical care time was exclusive of separately billable procedures and treating other patients. Critical care was necessary to treat or prevent imminent or life-threatening deterioration. Critical care was time spent personally  by me on the following activities: development of treatment plan with patient and/or surrogate as well as nursing, discussions with consultants, evaluation of patient's response to treatment, examination of patient, obtaining history from patient or surrogate, ordering and performing treatments and interventions, ordering and review of laboratory studies, ordering and review of radiographic studies, pulse oximetry and re-evaluation of patient's condition.   Procedures Procedures (including critical care time)  Medications Ordered in ED Medications  sodium chloride flush (NS) 0.9 % injection 3 mL (has no administration in time range)  sodium chloride 0.9 % bolus 2,000 mL (0 mLs Intravenous Stopped 12/19/19 1807)  ondansetron (ZOFRAN) injection 4 mg (4 mg Intravenous Given 12/19/19 1557)  sodium chloride 0.9 % bolus 1,000 mL (1,000 mLs Intravenous New Bag/Given 12/19/19 1806)  methylPREDNISolone sodium succinate (SOLU-MEDROL) 125 mg/2 mL injection 125 mg (125 mg Intravenous Given 12/19/19 1807)  potassium chloride SA (KLOR-CON) CR tablet 40 mEq (40 mEq Oral Given 12/19/19 1806)    ED Course  I have reviewed the triage vital signs and the nursing notes.  Pertinent labs & imaging results that were available during my care of the patient were reviewed by me and considered in my medical decision making (see chart for details).    MDM Rules/Calculators/A&P                      CT scan shows worsening Crohn's disease.  Labs show significant AKI.  Patient given IV fluids and steroids.  The hospitalist will admit.  GI will consult.     This patient presents to the ED for concern of vomiting and diarrhea, this involves an extensive number of treatment options, and is a complaint that carries with it a high risk of complications and morbidity.  The differential diagnosis includes worsening Crohn's.  Viral syndrome.  Diverticulitis.   Lab Tests:   I Ordered, reviewed, and interpreted labs, which  included  CBC and chemistries.  Patient has an elevated white count and hypokalemia with AKI  Medicines ordered:   I ordered medication Zofran and fluids for nausea and dehydration  Imaging Studies ordered:   I ordered imaging studies which included CT scan of the abdomen and  I independently visualized and interpreted imaging which showed worsening Crohn's disease  Additional history obtained:   Additional history obtained from old records  Previous records obtained and reviewed  Consultations Obtained:   I consulted the hospitalist and GI doctor and discussed lab and imaging findings  Reevaluation:  After the interventions stated above, I reevaluated the patient and found patient improved with the IV fluids.  Critical Interventions:  .  I spoke with Dr. Tarri Glenn from the [facility] and she recommended 125 mg of Solu-Medrol every 6 hours along with Cipro and Flagyl IV.  GI will see the patient tomorrow morning Final Clinical Impression(s) / ED Diagnoses Final diagnoses:  Dehydration    Rx / DC Orders ED Discharge Orders    None       Milton Ferguson, MD 12/19/19 Pollyann Samples, MD 12/19/19 1842

## 2019-12-19 NOTE — Progress Notes (Signed)
PHARMACY NOTE:  ANTIMICROBIAL RENAL DOSAGE ADJUSTMENT  Current antimicrobial regimen includes a mismatch between antimicrobial dosage and estimated renal function.  As per policy approved by the Pharmacy & Therapeutics and Medical Executive Committees, the antimicrobial dosage will be adjusted accordingly.  Current antimicrobial dosage:  Cipro 469m IV q12 Patient received one dose Cipro 4077mIV on 4/20 at 7pm (auto-verified in ED)  Indication: intra-abd infection, hx of Crohn's dz  Renal Function:   Estimated Creatinine Clearance: 19.6 mL/min (A) (by C-G formula based on SCr of 3.12 mg/dL (H)). []      On intermittent HD, scheduled: []      On CRRT    Antimicrobial dosage has been changed to:  Cipro 20056mV q24, starting 4/21 pm   Additional Comments: Flagyl needs no dose/schedule adjustment for renal fysfx   Thank you for allowing pharmacy to be a part of this patient's care.  GreMinda DittoarmD 12/19/2019 8:32 PM

## 2019-12-19 NOTE — ED Triage Notes (Signed)
Patient states he has been drinking and all is coming back through his colostomy bag. Patient also c/o dry heaving. Patient states he normally has loose stool in his colostomy bag. Patient reports a history of Crohn's.

## 2019-12-19 NOTE — H&P (Signed)
Gregory [name]. QQI:[id number] DOB: 18-Dec-1960 DOA: 12/19/2019     PCP: Billie Ruddy, MD   Outpatient Specialists:     GI Dr. Havery Moros    Patient arrived to ER on 12/19/19 at 1357  Patient coming from: home Lives alone,     Chief Complaint:  Feeling dehydrated nausea vomiting HPI: Gregory [name]. is a 59 y.o. male with medical history significant of Crohn's disease sp colostomy, depression, GERD     Presented with 4 d hx of increased ostomy output feels weak and dehydrated has had significant nausea and headache.  He called in the GI office yesterday stating he feels very sick and he was encouraged to come to emergency department.  He has been dry heaving states whenever he is drinking, back out of his colostomy bag Reported had recent Covid vaccine. Also reports some loose stools.   Infectious risk factors:  Reports  N/V/Diarrhea/abdominal pain,      Has  been vaccinated against COVID x1 Moderna April1   in house  PCR testing  Pending  Lab Results  Component Value Date   [place] 12/19/2019     Regarding pertinent Chronic problems:  History of Crohn's disease followed by GI status post partial colostomy     While in ER: Noted to be in AKI with creatinine elevated up to 3   ER Provider Called: GI   Dr. dr. Tarri Glenn  continue Solu-Medrol 125 mg every 6 hours and give the patient Flagyl and Cipro antibiotics for possible infection.  They Recommend admit to medicine   Will see in AM  CT scan Progressive wall thickening involving a loop of ileum in the left upper and mid pelvis, Hospitalist was called for admission for Crohn's flare and AKI due to dehydration  The following Work up has been ordered so far:  Orders Placed This Encounter  Procedures  . Respiratory Panel by RT PCR (Flu A&B, Covid) - Nasopharyngeal Swab  . CT ABDOMEN PELVIS WO CONTRAST  . Lipase, blood  . Comprehensive metabolic panel  . CBC  . Urinalysis, Routine w  reflex microscopic  . Lactic acid, plasma  . Diet NPO time specified  . Saline Lock IV, Maintain IV access  . Consult to gastroenterology  ALL PATIENTS BEING ADMITTED/HAVING PROCEDURES NEED COVID-19 SCREENING  . Consult to hospitalist  ALL PATIENTS BEING ADMITTED/HAVING PROCEDURES NEED COVID-19 SCREENING    Following Medications were ordered in ER: Medications  sodium chloride flush (NS) 0.9 % injection 3 mL (has no administration in time range)  sodium chloride 0.9 % bolus 2,000 mL (0 mLs Intravenous Stopped 12/19/19 1807)  ondansetron (ZOFRAN) injection 4 mg (4 mg Intravenous Given 12/19/19 1557)  sodium chloride 0.9 % bolus 1,000 mL (1,000 mLs Intravenous New Bag/Given 12/19/19 1806)  methylPREDNISolone sodium succinate (SOLU-MEDROL) 125 mg/2 mL injection 125 mg (125 mg Intravenous Given 12/19/19 1807)  potassium chloride SA (KLOR-CON) CR tablet 40 mEq (40 mEq Oral Given 12/19/19 1806)        Consult Orders  (From admission, onward)         Start     Ordered   12/19/19 1757  Consult to hospitalist  ALL PATIENTS BEING ADMITTED/HAVING PROCEDURES NEED COVID-19 SCREENING  Once    Comments: ALL PATIENTS BEING ADMITTED/HAVING PROCEDURES NEED COVID-19 SCREENING  Provider:  (Not yet assigned)  Question Answer Comment  Place call to: [facility],  call [phone]   Reason for Consult Admit  12/19/19 1756          Significant initial  Findings: Abnormal Labs Reviewed  LIPASE, BLOOD - Abnormal; Notable for the following components:      Result Value   Lipase 58 (*)    All other components within normal limits  COMPREHENSIVE METABOLIC PANEL - Abnormal; Notable for the following components:   Sodium 132 (*)    Potassium 3.2 (*)    CO2 19 (*)    Glucose, Bld 126 (*)    BUN 41 (*)    Creatinine, Ser 3.12 (*)    Calcium 8.8 (*)    Total Bilirubin 1.3 (*)    GFR calc non Af Amer 21 (*)    GFR calc Af Amer 24 (*)    All other components within normal limits  CBC -  Abnormal; Notable for the following components:   WBC 15.7 (*)    All other components within normal limits   Otherwise labs showing:    Recent Labs  Lab 12/19/19 1527  NA 132*  K 3.2*  CO2 19*  GLUCOSE 126*  BUN 41*  CREATININE 3.12*  CALCIUM 8.8*    Cr Up from baseline see below Lab Results  Component Value Date   CREATININE 3.12 (H) 12/19/2019   CREATININE 1.25 05/11/2019   CREATININE 1.05 01/27/2019    Recent Labs  Lab 12/19/19 1527  AST 21  ALT 15  ALKPHOS 112  BILITOT 1.3*  PROT 8.1  ALBUMIN 4.1   Lab Results  Component Value Date   CALCIUM 8.8 (L) 12/19/2019   PHOS 3.8 04/28/2017     WBC       Component Value Date/Time   WBC 15.7 (H) 12/19/2019 1527    ALC No components found for: LYMPHAB    Plt: Lab Results  Component Value Date   PLT 325 12/19/2019     Lab Results  Component Value Date   SARSCOV2NAA NEGATIVE 12/19/2019    HG/HCT  stable,      Component Value Date/Time   HGB 16.6 12/19/2019 1527   HCT 49.7 12/19/2019 1527    Recent Labs  Lab 12/19/19 1527  LIPASE 58*   No results for input(s): AMMONIA in the last 168 hours.  No components found for: LABALBU    Cardiac Panel (last 3 results) Recent Labs    12/19/19 1527  CKTOTAL 80    ECG:  Ordered   UA  not ordered      Ordered    CTabd/pelvis -changes consistent with Crohn's     ED Triage Vitals  Enc Vitals Group     BP 12/19/19 1429 95/84     Pulse Rate 12/19/19 1429 (!) 103     Resp 12/19/19 1429 16     Temp 12/19/19 1429 97.8 F (36.6 C)     Temp src --      SpO2 12/19/19 1429 100 %     Weight 12/19/19 1430 118 lb 3.2 oz (53.6 kg)     Height 12/19/19 1430 5' 11"  (1.803 m)     Head Circumference --      Peak Flow --      Pain Score 12/19/19 1430 0     Pain Loc --      Pain Edu? --      Excl. in [place]? --   TMAX(24)@       Latest  Blood pressure (!) 98/57, pulse 67, temperature 97.8 F (36.6 C), resp. rate 15, height 5' 11"  (  1.803 m), weight 53.6  kg, SpO2 100 %.    Review of Systems:    Pertinent positives include abdominal pain, nausea, vomiting, diarrhea,   fatigue Constitutional:  No weight loss, night sweats, Fevers, chills,, weight loss  HEENT:  No headaches, Difficulty swallowing,Tooth/dental problems,Sore throat,  No sneezing, itching, ear ache, nasal congestion, post nasal drip,  Cardio-vascular:  No chest pain, Orthopnea, PND, anasarca, dizziness, palpitations.no Bilateral lower extremity swelling  GI:  No heartburn, indigestion,change in bowel habits, loss of appetite, melena, blood in stool, hematemesis Resp:  no shortness of breath at rest. No dyspnea on exertion, No excess mucus, no productive cough, No non-productive cough, No coughing up of blood.No change in color of mucus.No wheezing. Skin:  no rash or lesions. No jaundice GU:  no dysuria, change in color of urine, no urgency or frequency. No straining to urinate.  No flank pain.  Musculoskeletal:  No joint pain or no joint swelling. No decreased range of motion. No back pain.  Psych:  No change in mood or affect. No depression or anxiety. No memory loss.  Neuro: no localizing neurological complaints, no tingling, no weakness, no double vision, no gait abnormality, no slurred speech, no confusion  All systems reviewed and apart from [place] all are negative  Past Medical History:   Past Medical History:  Diagnosis Date  . Acute endocarditis   . Crohn's disease ([place])   . Depression   . Eating disorder   . GERD (gastroesophageal reflux disease)   . Kidney stones   . Short gut syndrome       Past Surgical History:  Procedure Laterality Date  . COLONOSCOPY WITH PROPOFOL N/A 09/17/2017   Procedure: COLONOSCOPY WITH PROPOFOL;  Surgeon: [name], MD;  Location: [facility];  Service: Endoscopy;  Laterality: N/A;  Through ileostomy.  [name] WITH STENT PLACEMENT Left 05/20/2017   Procedure: CYSTOSCOPY WITH STENT PLACEMENT;  Surgeon:  [name], MD;  Location: [facility];  Service: Urology;  Laterality: Left;  . ILEOSTOMY    . IR NEPHROSTOMY EXCHANGE LEFT  04/12/2017  . IR NEPHROSTOMY PLACEMENT LEFT  04/12/2017  . IR NEPHROSTOMY PLACEMENT LEFT  04/29/2017  . NEPHROLITHOTOMY Left 05/14/2017   Procedure: LEFT NEPHROLITHOTOMY PERCUTANEOUS WITH SURGEON ACCESS;  Surgeon: [name], MD;  Location: [facility];  Service: Urology;  Laterality: Left;  . SMALL INTESTINE SURGERY    . SUBTOTAL COLECTOMY      Social History:  Ambulatory  Independently /cane    reports that he quit smoking about 13 years ago. His smoking use included cigarettes. He has a 20.00 pack-year smoking history. He has never used smokeless tobacco. He reports that he does not drink alcohol or use drugs.     Family History:   Family History  Problem Relation Age of Onset  . Heart disease Mother   . Diabetes Father   . Heart disease Father   . Heart attack Father   . Melanoma Sister   . Crohn's disease Brother   . Inflammatory bowel disease Brother   . Diabetes Paternal Grandmother   . Colon cancer Neg Hx   . Rectal cancer Neg Hx   . Stomach cancer Neg Hx   . Esophageal cancer Neg Hx     Allergies: Allergies  Allergen Reactions  . Methotrexate Derivatives Other (See Comments)    Anemia, low WBC, severe GI symptoms,   . Phenergan [Promethazine Hcl] Other (See Comments)    twitching  . Humira [Adalimumab]  Other (See Comments)    Intolerance  . Penicillins Other (See Comments)    REACTION DURING SURGERY Has patient had a PCN reaction causing immediate rash, facial/tongue/throat swelling, SOB or lightheadedness with hypotension: Unknown Has patient had a PCN reaction causing severe rash involving mucus membranes or skin necrosis: Unknown Has patient had a PCN reaction that required hospitalization: Unknown Has patient had a PCN reaction occurring within the last 10 years: No If all of the above answers are "NO", then may proceed with  Cephalosporin use.   . Remicade [Infliximab] Other (See Comments)    Intolerance     Prior to Admission medications   Medication Sig Start Date End Date Taking? Authorizing Provider  acetaminophen (TYLENOL) 500 MG tablet Take 500 mg by mouth every 6 (six) hours as needed for moderate pain.   Yes [provider]  diphenoxylate-atropine (LOMOTIL) 2.5-0.025 MG tablet TAKE 1 TABLET 4 TIMES A DAY AS NEEDED FOR DIARRHEA OR LOOSE STOOLS Patient taking differently: Take 1 tablet by mouth 4 (four) times daily as needed for diarrhea or loose stools.  12/15/19  Yes Armbruster, Carlota Raspberry, MD  Multiple Vitamin (MULTIVITAMIN WITH MINERALS) TABS tablet Take 1 tablet by mouth daily. 05/03/17  Yes Rai, Ripudeep K, MD  ondansetron (ZOFRAN-ODT) 4 MG disintegrating tablet TAKE ONE TABLET BY MOUTH EVERY 8 HOURS AS NEEDED FOR NAUSEA AND VOMITING Patient taking differently: Take 4 mg by mouth every 8 (eight) hours as needed for nausea or vomiting.  10/12/18  Yes Armbruster, Carlota Raspberry, MD  potassium chloride (KLOR-CON) 20 MEQ packet Take 20 mEq by mouth daily. Patient taking differently: Take 20 mEq by mouth See admin instructions. Patient is taking one on Monday and one on Thursday 01/30/19  Yes Armbruster, Carlota Raspberry, MD  sertraline (ZOLOFT) 50 MG tablet Take 1 tablet (50 mg total) by mouth daily. Patient taking differently: Take 50 mg by mouth daily as needed (down spells?).  08/26/17  Yes Billie Ruddy, MD  traMADol (ULTRAM) 50 MG tablet Take 1 tablet (50 mg total) by mouth every 8 (eight) hours as needed. 10/20/19  Yes Armbruster, Carlota Raspberry, MD  ustekinumab (STELARA) 90 MG/ML SOSY injection Inject 1 mL (90 mg total) into the skin every 8 (eight) weeks. Patient taking differently: Inject 90 mg into the skin every 30 (thirty) days.  11/07/18  Yes Armbruster, Carlota Raspberry, MD  Vitamin D, Ergocalciferol, 2000 units CAPS Take 1 capsule by mouth daily.   Yes [provider]  cholestyramine light (PREVALITE) 4 g  packet Take 1 packet (4 g total) by mouth daily. Patient not taking: Reported on 12/19/2019 01/21/18   Lavina Hamman, MD  cyanocobalamin (,VITAMIN B-12,) 1000 MCG/ML injection Inject 1 mL (1,000 mcg total) into the muscle once a week. Inject once a wk. for 4 weeks, then once a month Patient not taking: Reported on 12/19/2019 04/10/19   Yetta Flock, MD  ferrous sulfate 325 (65 FE) MG tablet TAKE 1 TABLET (325 MG TOTAL) 2 (TWO) TIMES DAILY WITH A MEAL BY MOUTH. Patient not taking: Reported on 12/19/2019 11/15/17   Yetta Flock, MD  ipratropium (ATROVENT) 0.03 % nasal spray Place 2 sprays 2 (two) times daily into both nostrils. Can use 3 times a day as needed Patient not taking: Reported on 12/19/2019 07/12/17   Yetta Flock, MD  ondansetron (ZOFRAN) 4 MG tablet Take 1 tablet every 8 (eight) hours as needed for nausea or vomiting. Patient not taking: Reported on 12/19/2019 12/15/19  Yetta Flock, MD   Physical Exam: Blood pressure (!) 98/57, pulse 67, temperature 97.8 F (36.6 C), resp. rate 15, height 5' 11"  (1.803 m), weight 53.6 kg, SpO2 100 %. 1. General:  in No Acute distress  Chronically ill -appearing 2. Psychological: Alert and   Oriented 3. Head/ENT:   Dry Mucous Membranes                          Head Non traumatic, neck supple                          Poor Dentition 4. SKIN:  decreased Skin turgor,  Skin clean Dry and intact no rash 5. Heart: Regular rate and rhythm no   Murmur, no Rub or gallop 6. Lungs: no wheezes or crackles   7. Abdomen: Soft, generally tender, Non distended bowel sounds present, colostomy present 8. Lower extremities: no clubbing, cyanosis, no edema 9. Neurologically Grossly intact, moving all 4 extremities equally  10. MSK: Normal range of motion   All other LABS:     Recent Labs  Lab 12/19/19 1527  WBC 15.7*  HGB 16.6  HCT 49.7  MCV 88.4  PLT 325     Recent Labs  Lab 12/19/19 1527  NA 132*  K 3.2*  CL 100  CO2  19*  GLUCOSE 126*  BUN 41*  CREATININE 3.12*  CALCIUM 8.8*     Recent Labs  Lab 12/19/19 1527  AST 21  ALT 15  ALKPHOS 112  BILITOT 1.3*  PROT 8.1  ALBUMIN 4.1       Cultures:    Component Value Date/Time   SDES URINE, RANDOM 05/18/2017 1900   SPECREQUEST NONE 05/18/2017 1900   CULT  05/18/2017 1900    NO GROWTH Performed at [facility], [place] [address], [place], [place] [address]    REPTSTATUS 05/20/2017 FINAL 05/18/2017 1900     Radiological Exams on Admission: CT ABDOMEN PELVIS WO CONTRAST  Result Date: 12/19/2019 CLINICAL DATA:  Acute, generalized abdominal pain. Neutropenia. 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 History of Crohn's disease. EXAM: CT ABDOMEN AND PELVIS WITHOUT CONTRAST TECHNIQUE: Multidetector CT imaging of the abdomen and pelvis was performed following the standard protocol without IV contrast. COMPARISON:  01/17/2018. FINDINGS: Lower chest: Pronounced bullous changes in the inferior left upper lobe. Linear scarring in the left lower lobe. Normal sized heart. Hepatobiliary: No focal liver abnormality is seen. Status post cholecystectomy. No biliary dilatation. Pancreas: Unremarkable. No pancreatic ductal dilatation or surrounding inflammatory changes. Spleen: Normal in size without focal abnormality. Adrenals/Urinary Tract: Adrenal glands are unremarkable. Kidneys are normal, without renal calculi, focal lesion, or hydronephrosis. Bladder is unremarkable. Stomach/Bowel: Unremarkable stomach and small bowel. Again demonstrated are changes of a subtotal colectomy a Hartmann's pouch. The previously demonstrated loop of ileum in the left upper and in the mid pelvis is less dilated than previously seen. There is progressive associated wall thickening, currently measuring 8 mm in maximum thickness. This is distal to an anastomosis in the left mid abdomen. Bowel at the anastomosis remains dilated  without wall thickening. No inflammatory soft tissue stranding is seen. Vascular/Lymphatic: No significant vascular findings are present. No enlarged abdominal or pelvic lymph nodes. Reproductive: Prostate is unremarkable. Other: Left anterior mid pelvic ostomy. Musculoskeletal: Mild lumbar and lower thoracic spine degenerative changes. IMPRESSION: 1. Progressive wall thickening  involving a loop of ileum in the left upper and mid pelvis, distal to an anastomosis in the left mid abdomen. This is compatible mildly progressive thickening associated with the patient's known Crohn's disease. A neoplastic process can not be excluded but is less likely. 2. 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 3. Pronounced bullous changes in the inferior left upper lobe. Electronically Signed   By: Claudie Revering M.D.   On: 12/19/2019 17:21    Chart has been reviewed    Assessment/Plan   59 y.o. male with medical history significant of Crohn's disease sp colostomy, depression, GERD   Admitted for Crohn's flare and AKI due to dehydration  Present on Admission: . Exacerbation of Crohn's disease of small intestine ([place]) As per GI recommendations continue Cipro Flagyl and steroids 125 every 6 hours appreciate GI consult in the a.m.  I will bowel rest for today and pain control as needed  . AKI (acute kidney injury) ([place]) -most likely secondary to dehydration obtain urine electrolytes rehydrate and follow   . Nausea & vomiting -supportive management  . Protein calorie malnutrition ([place]) -would probably benefit from nutritional consult will check prealbumin  . GERD (gastroesophageal reflux disease) -chronic stable     . Dehydration we will rehydrate  . Hypokalemia - - will replace and repeat in AM,  check magnesium level and replace as needed  Depression resume home medications when able to tolerate   Other plan as per orders.  DVT prophylaxis:  SCD     Code Status:  FULL CODEI had  personally discussed CODE STATUS with patient   Family Communication:   Family not at  Bedside  Disposition Plan:     To home once workup is complete and patient is stable   Following barriers for discharge:                            Electrolytes corrected                             Pain controlled with PO medications                                                           Will need to be able to tolerate PO                                                        Will need consultants to evaluate patient prior to discharge                     Would benefit from PT/OT eval prior to DC  Ordered                                 Consults called: [facility]  Admission status:  ED Disposition    ED Disposition Condition Comment   Admit  The patient appears reasonably stabilized for admission considering the current resources, flow, and capabilities  available in the ED at this time, and I doubt any other [facility] requiring further screening and/or treatment in the ED prior to admission is  present.          inpatient     I Expect 2 midnight stay secondary to severity of patient's current illness need for inpatient interventions justified by the following:     Severe lab/radiological/exam abnormalities including:    AKI  and extensive comorbidities including:  Chronic pain   CKD  That are currently affecting medical management.   I expect  patient to be hospitalized for 2 midnights requiring inpatient medical care.  Patient is at high risk for adverse outcome (such as loss of life or disability) if not treated.  Indication for inpatient stay as follows:    severe pain requiring acute inpatient management,  inability to maintain oral hydration    Need for operative/procedural  intervention   Need for IV antibiotics, IV fluids, IV steroids     Level of care    tele  For  24H    Precautions: admitted as  asymptomatic screening protocol   PPE: Used by the provider:   P100  eye  Goggles,  Gloves     Kinzly Pierrelouis 12/19/2019, 9:01 PM    [facility]     after 2 AM please page floor coverage PA If 7AM-7PM, please contact the day team taking care of the patient using [email or web address]   Patient was evaluated in the context of the global COVID-19 pandemic, which necessitated consideration that the patient might be at risk for infection with the SARS-CoV-2 virus that causes COVID-19. Institutional protocols and algorithms that pertain to the evaluation of patients at risk for COVID-19 are in a state of rapid change based on information released by regulatory bodies including the CDC and federal and state organizations. These policies and algorithms were followed during the patient's care.

## 2019-12-20 DIAGNOSIS — R112 Nausea with vomiting, unspecified: Secondary | ICD-10-CM

## 2019-12-20 DIAGNOSIS — K5000 Crohn's disease of small intestine without complications: Secondary | ICD-10-CM

## 2019-12-20 DIAGNOSIS — N179 Acute kidney failure, unspecified: Secondary | ICD-10-CM

## 2019-12-20 LAB — CBC WITH DIFFERENTIAL/PLATELET
Abs Immature Granulocytes: 0.03 10*3/uL (ref 0.00–0.07)
Basophils Absolute: 0 10*3/uL (ref 0.0–0.1)
Basophils Relative: 0 %
Eosinophils Absolute: 0 10*3/uL (ref 0.0–0.5)
Eosinophils Relative: 0 %
HCT: 40.3 % (ref 39.0–52.0)
Hemoglobin: 12.9 g/dL — ABNORMAL LOW (ref 13.0–17.0)
Immature Granulocytes: 0 %
Lymphocytes Relative: 9 %
Lymphs Abs: 0.7 10*3/uL (ref 0.7–4.0)
MCH: 29.5 pg (ref 26.0–34.0)
MCHC: 32 g/dL (ref 30.0–36.0)
MCV: 92.2 fL (ref 80.0–100.0)
Monocytes Absolute: 0.1 10*3/uL (ref 0.1–1.0)
Monocytes Relative: 1 %
Neutro Abs: 6.4 10*3/uL (ref 1.7–7.7)
Neutrophils Relative %: 90 %
Platelets: 255 10*3/uL (ref 150–400)
RBC: 4.37 MIL/uL (ref 4.22–5.81)
RDW: 14.8 % (ref 11.5–15.5)
WBC: 7.2 10*3/uL (ref 4.0–10.5)
nRBC: 0 % (ref 0.0–0.2)

## 2019-12-20 LAB — COMPREHENSIVE METABOLIC PANEL
ALT: 12 U/L (ref 0–44)
AST: 13 U/L — ABNORMAL LOW (ref 15–41)
Albumin: 3.1 g/dL — ABNORMAL LOW (ref 3.5–5.0)
Alkaline Phosphatase: 83 U/L (ref 38–126)
Anion gap: 9 (ref 5–15)
BUN: 34 mg/dL — ABNORMAL HIGH (ref 6–20)
CO2: 14 mmol/L — ABNORMAL LOW (ref 22–32)
Calcium: 8.1 mg/dL — ABNORMAL LOW (ref 8.9–10.3)
Chloride: 113 mmol/L — ABNORMAL HIGH (ref 98–111)
Creatinine, Ser: 2.34 mg/dL — ABNORMAL HIGH (ref 0.61–1.24)
GFR calc Af Amer: 34 mL/min — ABNORMAL LOW (ref >60)
GFR calc non Af Amer: 30 mL/min — ABNORMAL LOW (ref >60)
Glucose, Bld: 135 mg/dL — ABNORMAL HIGH (ref 70–99)
Potassium: 3.7 mmol/L (ref 3.5–5.1)
Sodium: 136 mmol/L (ref 135–145)
Total Bilirubin: 1.1 mg/dL (ref 0.3–1.2)
Total Protein: 5.9 g/dL — ABNORMAL LOW (ref 6.5–8.1)

## 2019-12-20 LAB — HEMOGLOBIN A1C
Hgb A1c MFr Bld: 5.7 % — ABNORMAL HIGH (ref 4.8–5.6)
Mean Plasma Glucose: 116.89 mg/dL

## 2019-12-20 LAB — PHOSPHORUS: Phosphorus: 3.3 mg/dL (ref 2.5–4.6)

## 2019-12-20 LAB — GLUCOSE, CAPILLARY
Glucose-Capillary: 120 mg/dL — ABNORMAL HIGH (ref 70–99)
Glucose-Capillary: 169 mg/dL — ABNORMAL HIGH (ref 70–99)
Glucose-Capillary: 139 mg/dL — ABNORMAL HIGH (ref 70–99)
Glucose-Capillary: 159 mg/dL — ABNORMAL HIGH (ref 70–99)

## 2019-12-20 LAB — PREALBUMIN: Prealbumin: 23.5 mg/dL (ref 18–38)

## 2019-12-20 LAB — HIV ANTIBODY (ROUTINE TESTING W REFLEX): HIV Screen 4th Generation wRfx: NONREACTIVE

## 2019-12-20 LAB — MAGNESIUM: Magnesium: 2 mg/dL (ref 1.7–2.4)

## 2019-12-20 LAB — TSH: TSH: 0.611 u[IU]/mL (ref 0.350–4.500)

## 2019-12-20 MED ORDER — INSULIN ASPART 100 UNIT/ML ~~LOC~~ SOLN: 0.0000 [IU] | Freq: Every day | SUBCUTANEOUS | Status: DC

## 2019-12-20 MED ORDER — INSULIN ASPART 100 UNIT/ML ~~LOC~~ SOLN
0.0000 [IU] | Freq: Three times a day (TID) | SUBCUTANEOUS | Status: DC
  Administered 2019-12-20 – 2019-12-21 (×2): 1 [IU] via SUBCUTANEOUS

## 2019-12-20 MED ORDER — LACTATED RINGERS IV SOLN: INTRAVENOUS | Status: DC

## 2019-12-20 MED ORDER — SODIUM CHLORIDE 0.9 % IV SOLN: INTRAVENOUS | Status: DC

## 2019-12-20 MED ORDER — METHYLPREDNISOLONE SODIUM SUCC 40 MG IJ SOLR
40.0000 mg | Freq: Three times a day (TID) | INTRAMUSCULAR | Status: AC
  Administered 2019-12-20 – 2019-12-21 (×4): 40 mg via INTRAVENOUS
  Filled 2019-12-20 (×4): qty 1

## 2019-12-20 NOTE — Progress Notes (Signed)
PROGRESS NOTE    Gregory [name].  DPO:[id number] DOB: 1961-08-27 DOA: 12/19/2019 PCP: Billie Ruddy, MD   Brief Narrative:  59 year old with history of Crohn's disease status post colostomy, depression, GERD presents with 4 days of weakness, nausea, headache and unable to tolerate p.o.  He went to GI office yesterday was sent to ER.  Recently had COVID-19 vaccination.  GI Dr. Tarri Glenn called ER recommended Solu-Medrol 125 mg every 6 hours, Cipro and Flagyl.  CT abdomen pelvis showed thickened loop of ileum.  Also noted to be in acute kidney injury likely from dehydration   Assessment & Plan:   Active Problems:   Nausea & vomiting   Protein calorie malnutrition (HCC)   GERD (gastroesophageal reflux disease)   Hypokalemia   AKI (acute kidney injury) ([place])   Crohn's disease of both small and lg int w unsp comp ([place])   Dehydration   Exacerbation of Crohn's disease of small intestine (HCC)  nausea vomiting, nonbloody acute exacerbation of Crohn's disease, colostomy in place -continue Cipro Flagyl -Solu-Medrol 125 mg every 6 hours, will add Accu-Chek and sliding scale.  Should be able to reduce Solu-Medrol dose, will defer this to GIs discretion -diet as tolerated -GI to see the patient this morning -supportive care -if develops diarrhea, rule out C. difficile  acute kidney injury secondary to dehydration, prerenal moderate to severe dehydration -Baseline creatinine 1.2, admission creatinine 3.12 -hydration, monitor renal function  moderate protein calorie malnutrition -dietitian consulted  GERD -PPI  depression -resume home meds when able to tolerate p.o.  DVT prophylaxis: SCDs Code Status: Full Family Communication: None Disposition Plan:   Patient From= home  Patient Anticipated D/C place= home  Barriers= maintain hospital stay until his oral intake improves in the meantime continue IV fluids.  Anticipate discharge in the next 24-48  hours   Subjective: Abdominal pain significantly better this morning.  Was able to tolerate clear liquid diet  Review of Systems Otherwise negative except as per HPI, including: General: Denies fever, chills, night sweats or unintended weight loss. Resp: Denies cough, wheezing, shortness of breath. Cardiac: Denies chest pain, palpitations, orthopnea, paroxysmal nocturnal dyspnea. GI: Denies abdominal pain, nausea, vomiting, diarrhea or constipation GU: Denies dysuria, frequency, hesitancy or incontinence MS: Denies muscle aches, joint pain or swelling Neuro: Denies headache, neurologic deficits (focal weakness, numbness, tingling), abnormal gait Psych: Denies anxiety, depression, SI/HI/AVH Skin: Denies new rashes or lesions ID: Denies sick contacts, exotic exposures, travel  Examination:  Constitutional: Not in acute distress Respiratory: Clear to auscultation bilaterally Cardiovascular: Normal sinus rhythm, no rubs Abdomen: Nontender nondistended good bowel sounds Musculoskeletal: No edema noted Skin: No rashes seen Neurologic: CN 2-12 grossly intact.  And nonfocal Psychiatric: Normal judgment and insight. Alert and oriented x 3. Normal mood. Ostomy bag noted  Objective: Vitals:   12/19/19 2110 12/19/19 2238 12/20/19 0226 12/20/19 0642  BP: 95/63 (!) 91/59 99/68 107/71  Pulse: 70 (!) 58 60 62  Resp: 20 17 20 20   Temp:  98.2 F (36.8 C) 97.8 F (36.6 C) 97.8 F (36.6 C)  SpO2: 99% 100% 98% 100%  Weight:      Height:        Intake/Output Summary (Last 24 hours) at 12/20/2019 0813 Last data filed at 12/20/2019 0307 Gross per 24 hour  Intake 2914.25 ml  Output --  Net 2914.25 ml   Filed Weights   12/19/19 1430  Weight: 53.6 kg     Data Reviewed:   CBC: Recent Labs  Lab 12/19/19 1527 12/20/19 0642  WBC 15.7* 7.2  NEUTROABS  --  6.4  HGB 16.6 12.9*  HCT 49.7 40.3  MCV 88.4 92.2  PLT 325 621   Basic Metabolic Panel: Recent Labs  Lab 12/19/19 1527  12/19/19 2314 12/20/19 0642  NA 132* 136 136  K 3.2* 3.1* 3.7  CL 100 111 113*  CO2 19* 14* 14*  GLUCOSE 126* 131* 135*  BUN 41* 35* 34*  CREATININE 3.12* 2.41* 2.34*  CALCIUM 8.8* 7.6* 8.1*  MG 1.7  --  2.0  PHOS 4.5  --  3.3   GFR: Estimated Creatinine Clearance: 26.1 mL/min (A) (by C-G formula based on SCr of 2.34 mg/dL (H)). Liver Function Tests: Recent Labs  Lab 12/19/19 1527 12/20/19 0642  AST 21 13*  ALT 15 12  ALKPHOS 112 83  BILITOT 1.3* 1.1  PROT 8.1 5.9*  ALBUMIN 4.1 3.1*   Recent Labs  Lab 12/19/19 1527  LIPASE 58*   No results for input(s): AMMONIA in the last 168 hours. Coagulation Profile: No results for input(s): INR, PROTIME in the last 168 hours. Cardiac Enzymes: Recent Labs  Lab 12/19/19 1527  CKTOTAL 80   BNP (last 3 results) No results for input(s): PROBNP in the last 8760 hours. HbA1C: No results for input(s): HGBA1C in the last 72 hours. CBG: No results for input(s): GLUCAP in the last 168 hours. Lipid Profile: No results for input(s): CHOL, HDL, LDLCALC, TRIG, CHOLHDL, LDLDIRECT in the last 72 hours. Thyroid Function Tests: Recent Labs    12/20/19 0642  TSH 0.611   Anemia Panel: No results for input(s): VITAMINB12, FOLATE, FERRITIN, TIBC, IRON, RETICCTPCT in the last 72 hours. Sepsis Labs: Recent Labs  Lab 12/19/19 1802  LATICACIDVEN 1.8    Recent Results (from the past 240 hour(s))  Respiratory Panel by RT PCR (Flu A&B, Covid) - Nasopharyngeal Swab     Status: None   Collection Time: 12/19/19  6:22 PM   Specimen: Nasopharyngeal Swab  Result Value Ref Range Status   SARS Coronavirus 2 by RT PCR NEGATIVE NEGATIVE Final    Comment: (NOTE) SARS-CoV-2 target nucleic acids are NOT DETECTED. The SARS-CoV-2 RNA is generally detectable in upper respiratoy specimens during the acute phase of infection. The lowest concentration of SARS-CoV-2 viral copies this assay can detect is 131 copies/mL. A negative result does not  preclude SARS-Cov-2 infection and should not be used as the sole basis for treatment or other patient management decisions. A negative result may occur with  improper specimen collection/handling, submission of specimen other than nasopharyngeal swab, presence of viral mutation(s) within the areas targeted by this assay, and inadequate number of viral copies (<131 copies/mL). A negative result must be combined with clinical observations, patient history, and epidemiological information. The expected result is Negative. Fact Sheet for Patients:  PinkCheek.be Fact Sheet for Healthcare Providers:  GravelBags.it This test is not yet ap proved or cleared by the [place] FDA and  has been authorized for detection and/or diagnosis of SARS-CoV-2 by FDA under an Emergency Use Authorization (EUA). This EUA will remain  in effect (meaning this test can be used) for the duration of the COVID-19 declaration under Section 564(b)(1) of the Act, 21 U.S.C. section 360bbb-3(b)(1), unless the authorization is terminated or revoked sooner.    Influenza A by PCR NEGATIVE NEGATIVE Final   Influenza B by PCR NEGATIVE NEGATIVE Final    Comment: (NOTE) The Xpert Xpress SARS-CoV-2/FLU/RSV assay is intended as an aid in  the diagnosis of influenza from Nasopharyngeal swab specimens and  should not be used as a sole basis for treatment. Nasal washings and  aspirates are unacceptable for Xpert Xpress SARS-CoV-2/FLU/RSV  testing. Fact Sheet for Patients: PinkCheek.be Fact Sheet for Healthcare Providers: GravelBags.it This test is not yet approved or cleared by the [place] FDA and  has been authorized for detection and/or diagnosis of SARS-CoV-2 by  FDA under an Emergency Use Authorization (EUA). This EUA will remain  in effect (meaning this test can be used) for the duration of the   Covid-19 declaration under Section 564(b)(1) of the Act, 21  U.S.C. section 360bbb-3(b)(1), unless the authorization is  terminated or revoked. Performed at [facility], [place] [address] [place], [place] [address]          Radiology Studies: CT ABDOMEN PELVIS WO CONTRAST  Result Date: 12/19/2019 CLINICAL DATA:  Acute, generalized abdominal pain. Neutropenia. 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 History of Crohn's disease. EXAM: CT ABDOMEN AND PELVIS WITHOUT CONTRAST TECHNIQUE: Multidetector CT imaging of the abdomen and pelvis was performed following the standard protocol without IV contrast. COMPARISON:  01/17/2018. FINDINGS: Lower chest: Pronounced bullous changes in the inferior left upper lobe. Linear scarring in the left lower lobe. Normal sized heart. Hepatobiliary: No focal liver abnormality is seen. Status post cholecystectomy. No biliary dilatation. Pancreas: Unremarkable. No pancreatic ductal dilatation or surrounding inflammatory changes. Spleen: Normal in size without focal abnormality. Adrenals/Urinary Tract: Adrenal glands are unremarkable. Kidneys are normal, without renal calculi, focal lesion, or hydronephrosis. Bladder is unremarkable. Stomach/Bowel: Unremarkable stomach and small bowel. Again demonstrated are changes of a subtotal colectomy a Hartmann's pouch. The previously demonstrated loop of ileum in the left upper and in the mid pelvis is less dilated than previously seen. There is progressive associated wall thickening, currently measuring 8 mm in maximum thickness. This is distal to an anastomosis in the left mid abdomen. Bowel at the anastomosis remains dilated without wall thickening. No inflammatory soft tissue stranding is seen. Vascular/Lymphatic: No significant vascular findings are present. No enlarged abdominal or pelvic lymph nodes. Reproductive: Prostate is unremarkable. Other: Left  anterior mid pelvic ostomy. Musculoskeletal: Mild lumbar and lower thoracic spine degenerative changes. IMPRESSION: 1. Progressive wall thickening involving a loop of ileum in the left upper and mid pelvis, distal to an anastomosis in the left mid abdomen. This is compatible mildly progressive thickening associated with the patient's known Crohn's disease. A neoplastic process can not be excluded but is less likely. 2. 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 3. Pronounced bullous changes in the inferior left upper lobe. Electronically Signed   By: Claudie Revering M.D.   On: 12/19/2019 17:21        Scheduled Meds: . methylPREDNISolone (SOLU-MEDROL) injection  125 mg Intravenous Q6H  . sodium chloride flush  3 mL Intravenous Once  . sodium chloride flush  3 mL Intravenous Q12H   Continuous Infusions: . ciprofloxacin    . metronidazole 100 mL/hr at 12/20/19 0307     LOS: 1 day   Time spent= 25 mins    Yaslin Kirtley Arsenio Loader, MD [facility]  If 7PM-7AM, please contact night-coverage  12/20/2019, 8:13 AM

## 2019-12-20 NOTE — Progress Notes (Signed)
Pt states tolerating clear liquid diet.  Denies abd pain, nausea or vomiting. Pt states he feel confident to advance diet. Advanced diet to full liquid as per order.

## 2019-12-20 NOTE — Consult Note (Addendum)
Referring Provider:  EDP Primary Care Physician:  Billie Ruddy, MD Primary Gastroenterologist:  Dr. Havery Moros  Reason for Consultation:  Crohn's flare  HPI: Gregory [name]. is a 59 y.o. male who has long-standing Crohn's disease as below.    Last seen by Dr. Havery Moros 04/10/2019.  Came to [facility] ED last night, 4/20, for complaints of feeling poorly with weakness, delirium.  Says that at the beginning of the month his dog was sick and is not sure if maybe he got sick from her and then he also received his first Covid vaccine so wondering if that pushed him into a flare.    Is on Sterlara every 4 weeks, takes it on the 18th of every month, but missed his dose 3 days ago because he was feeling so poorly.  Labs so significant AKI.  CT scan of the abdomen and pelvis without contrast showed the following:  IMPRESSION: 1. Progressive wall thickening involving a loop of ileum in the left upper and mid pelvis, distal to an anastomosis in the left mid abdomen. This is compatible mildly progressive thickening associated with the patient's known Crohn's disease. A neoplastic process can not be excluded but is less likely. 2. 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 3. Pronounced bullous changes in the inferior left upper lobe.  Has been placed on solumedrol 125 mg every 6 hours as well as IV cipro and flagyl at the recommendation of our on-call GI overnight.  Is already feeling betters.  Tolerated a good amount of clear liquids this AM.  Says that overall he has been very pleased with Stelara as it really has kept him out of the hospital for a while.  Says that his ostomy output was high last night due to IVF's but has decreased again this AM.  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 He has acolostomywith a remnant rectal pouch. He reports he has had a  bowel surgery in the 1982, with a "few more surgeries" in years after while in [place], as well as in [facility], they tried reversing hiscolostomywhich did not workwell for him, too much diarrhea and it was then reversed back. He has a history of abscess associated with small bowel.   He has been hospitalized multiple times in recent years, He hadbeen on chronic prednisone for 10 years with fluctuating levels. HewasCimzia for about 4-5 years in the past. He has been on sulfasalazine for a long time. He reports an allergy to 6MP - he does not know the details ofthe reaction. He had an allergic reaction - hives, to both Remicade and Humira.Allergy to methotrexate. Failed therapeutic Weyman Rodney, now on Stelara since March 2019.  Dose was increased to every 4 weeks in 08/2019 due to low trough levels and elevated fecal calprotectin.   Colonoscopy  07/05/2018 - Diffuse inflammation, graded as Rutgeerts Score i3 (diffuse aphthous ileitis) and characterized by erythema and aphthous ulcerations was found in the terminal ileum and at the surgical anastomosis (located in the right colon). Normal colon, however prep was poor in certain portion of the colon and was inadequate for screening purposes to rule out polyps, etc. The ostomy was normal..   Past Medical History:  Diagnosis Date  . Acute endocarditis   . Crohn's disease ([place])   . Depression   . Eating disorder   . GERD (gastroesophageal reflux disease)   . Kidney stones   .  Short gut syndrome     Past Surgical History:  Procedure Laterality Date  . COLONOSCOPY WITH PROPOFOL N/A 09/17/2017   Procedure: COLONOSCOPY WITH PROPOFOL;  Surgeon: [name], MD;  Location: [facility] ENDOSCOPY;  Service: Endoscopy;  Laterality: N/A;  Through ileostomy.  [name] WITH STENT PLACEMENT Left 05/20/2017   Procedure: CYSTOSCOPY WITH STENT PLACEMENT;  Surgeon: [name], MD;  Location: [facility] ORS;  Service: Urology;  Laterality: Left;  .  ILEOSTOMY    . IR NEPHROSTOMY EXCHANGE LEFT  04/12/2017  . IR NEPHROSTOMY PLACEMENT LEFT  04/12/2017  . IR NEPHROSTOMY PLACEMENT LEFT  04/29/2017  . NEPHROLITHOTOMY Left 05/14/2017   Procedure: LEFT NEPHROLITHOTOMY PERCUTANEOUS WITH SURGEON ACCESS;  Surgeon: [name], MD;  Location: [facility] ORS;  Service: Urology;  Laterality: Left;  . SMALL INTESTINE SURGERY    . SUBTOTAL COLECTOMY      Prior to Admission medications   Medication Sig Start Date End Date Taking? Authorizing Provider  acetaminophen (TYLENOL) 500 MG tablet Take 500 mg by mouth every 6 (six) hours as needed for moderate pain.   Yes [provider]  diphenoxylate-atropine (LOMOTIL) 2.5-0.025 MG tablet TAKE 1 TABLET 4 TIMES A DAY AS NEEDED FOR DIARRHEA OR LOOSE STOOLS Patient taking differently: Take 1 tablet by mouth 4 (four) times daily as needed for diarrhea or loose stools.  12/15/19  Yes Armbruster, Carlota Raspberry, MD  Multiple Vitamin (MULTIVITAMIN WITH MINERALS) TABS tablet Take 1 tablet by mouth daily. 05/03/17  Yes Rai, Ripudeep K, MD  ondansetron (ZOFRAN-ODT) 4 MG disintegrating tablet TAKE ONE TABLET BY MOUTH EVERY 8 HOURS AS NEEDED FOR NAUSEA AND VOMITING Patient taking differently: Take 4 mg by mouth every 8 (eight) hours as needed for nausea or vomiting.  10/12/18  Yes Armbruster, Carlota Raspberry, MD  potassium chloride (KLOR-CON) 20 MEQ packet Take 20 mEq by mouth daily. Patient taking differently: Take 20 mEq by mouth See admin instructions. Patient is taking one on Monday and one on Thursday 01/30/19  Yes Armbruster, Carlota Raspberry, MD  sertraline (ZOLOFT) 50 MG tablet Take 1 tablet (50 mg total) by mouth daily. Patient taking differently: Take 50 mg by mouth daily as needed (down spells?).  08/26/17  Yes Billie Ruddy, MD  traMADol (ULTRAM) 50 MG tablet Take 1 tablet (50 mg total) by mouth every 8 (eight) hours as needed. 10/20/19  Yes Armbruster, Carlota Raspberry, MD  ustekinumab (STELARA) 90 MG/ML SOSY injection Inject 1 mL (90 mg  total) into the skin every 8 (eight) weeks. Patient taking differently: Inject 90 mg into the skin every 30 (thirty) days.  11/07/18  Yes Armbruster, Carlota Raspberry, MD  Vitamin D, Ergocalciferol, 2000 units CAPS Take 1 capsule by mouth daily.   Yes [provider]  cholestyramine light (PREVALITE) 4 g packet Take 1 packet (4 g total) by mouth daily. Patient not taking: Reported on 12/19/2019 01/21/18   Lavina Hamman, MD  cyanocobalamin (,VITAMIN B-12,) 1000 MCG/ML injection Inject 1 mL (1,000 mcg total) into the muscle once a week. Inject once a wk. for 4 weeks, then once a month Patient not taking: Reported on 12/19/2019 04/10/19   Yetta Flock, MD  ferrous sulfate 325 (65 FE) MG tablet TAKE 1 TABLET (325 MG TOTAL) 2 (TWO) TIMES DAILY WITH A MEAL BY MOUTH. Patient not taking: Reported on 12/19/2019 11/15/17   Yetta Flock, MD  ipratropium (ATROVENT) 0.03 % nasal spray Place 2 sprays 2 (two) times daily into both nostrils. Can  use 3 times a day as needed Patient not taking: Reported on 12/19/2019 07/12/17   Yetta Flock, MD  ondansetron (ZOFRAN) 4 MG tablet Take 1 tablet every 8 (eight) hours as needed for nausea or vomiting. Patient not taking: Reported on 12/19/2019 12/15/19   Armbruster, Carlota Raspberry, MD    Current Facility-Administered Medications  Medication Dose Route Frequency Provider Last Rate Last Admin  . acetaminophen (TYLENOL) tablet 650 mg  650 mg Oral Q6H PRN Toy Baker, MD   650 mg at 12/19/19 2119   Or  . acetaminophen (TYLENOL) suppository 650 mg  650 mg Rectal Q6H PRN Doutova, Anastassia, MD      . ciprofloxacin (CIPRO) IVPB 200 mg  200 mg Intravenous Q24H Doutova, Anastassia, MD      . diphenoxylate-atropine (LOMOTIL) 2.5-0.025 MG per tablet 1 tablet  1 tablet Oral QID PRN Doutova, Anastassia, MD      . insulin aspart (novoLOG) injection 0-5 Units  0-5 Units Subcutaneous QHS Amin, Ankit Chirag, MD      . insulin aspart (novoLOG) injection 0-9 Units   0-9 Units Subcutaneous TID WC Amin, Ankit Chirag, MD      . lactated ringers infusion   Intravenous Continuous Amin, Ankit Chirag, MD      . methylPREDNISolone sodium succinate (SOLU-MEDROL) 125 mg/2 mL injection 125 mg  125 mg Intravenous Q6H Doutova, Anastassia, MD   125 mg at 12/20/19 0559  . metroNIDAZOLE (FLAGYL) IVPB 500 mg  500 mg Intravenous Q8H Doutova, Anastassia, MD 100 mL/hr at 12/20/19 0307 Rate Verify at 12/20/19 0307  . ondansetron (ZOFRAN) tablet 4 mg  4 mg Oral Q6H PRN Toy Baker, MD       Or  . ondansetron (ZOFRAN) injection 4 mg  4 mg Intravenous Q6H PRN Toy Baker, MD   4 mg at 12/19/19 2121  . sodium chloride flush (NS) 0.9 % injection 3 mL  3 mL Intravenous Once Doutova, Anastassia, MD      . sodium chloride flush (NS) 0.9 % injection 3 mL  3 mL Intravenous Q12H Doutova, Anastassia, MD      . traMADol (ULTRAM) tablet 50 mg  50 mg Oral Q8H PRN Toy Baker, MD        Allergies as of 12/19/2019 - Review Complete 12/19/2019  Allergen Reaction Noted  . Methotrexate derivatives Other (See Comments) 04/22/2017  . Phenergan [promethazine hcl] Other (See Comments) 01/18/2018  . Humira [adalimumab] Other (See Comments) 02/23/2017  . Penicillins Other (See Comments) 02/23/2017  . Remicade [infliximab] Other (See Comments) 02/23/2017    Family History  Problem Relation Age of Onset  . Heart disease Mother   . Diabetes Father   . Heart disease Father   . Heart attack Father   . Melanoma Sister   . Crohn's disease Brother   . Inflammatory bowel disease Brother   . Diabetes Paternal Grandmother   . Colon cancer Neg Hx   . Rectal cancer Neg Hx   . Stomach cancer Neg Hx   . Esophageal cancer Neg Hx     Social History   Socioeconomic History  . Marital status: Divorced    Spouse name: Not on file  . Number of children: 2  . Years of education: Not on file  . Highest education level: Not on file  Occupational History  . Occupation: Retired    Tobacco Use  . Smoking status: Former Smoker    Packs/day: 1.00    Years: 20.00    Pack years: 20.00  Types: Cigarettes    Quit date: 08/31/2006    Years since quitting: 13.3  . Smokeless tobacco: Never Used  Substance and Sexual Activity  . Alcohol use: No  . Drug use: No  . Sexual activity: Not on file  Other Topics Concern  . Not on file  Social History Narrative  . Not on file   Social Determinants of Health   Financial Resource Strain:   . Difficulty of Paying Living Expenses:   Food Insecurity:   . Worried About Charity fundraiser in the Last Year:   . Arboriculturist in the Last Year:   Transportation Needs:   . Film/video editor (Medical):   Marland Kitchen Lack of Transportation (Non-Medical):   Physical Activity:   . Days of Exercise per Week:   . Minutes of Exercise per Session:   Stress:   . Feeling of Stress :   Social Connections:   . Frequency of Communication with Friends and Family:   . Frequency of Social Gatherings with Friends and Family:   . Attends Religious Services:   . Active Member of Clubs or Organizations:   . Attends Archivist Meetings:   Marland Kitchen Marital Status:   Intimate Partner Violence:   . Fear of Current or Ex-Partner:   . Emotionally Abused:   Marland Kitchen Physically Abused:   . Sexually Abused:     Review of Systems: ROS is O/W negative except as mentioned in HPI.  Physical Exam: Vital signs in last 24 hours: Temp:  [97.8 F (36.6 C)-98.2 F (36.8 C)] 97.8 F (36.6 C) (04/21 0642) Pulse Rate:  [58-103] 62 (04/21 0642) Resp:  [14-20] 20 (04/21 0642) BP: (91-112)/(54-99) 107/71 (04/21 0642) SpO2:  [98 %-100 %] 100 % (04/21 0786) Weight:  [53.6 kg] 53.6 kg (04/20 1430) Last BM Date: 12/19/19 General:  Alert, thin, pleasant and cooperative in NAD Head:  Normocephalic and atraumatic. Eyes:  Sclera clear, no icterus.  Conjunctiva pink. Ears:  Normal auditory acuity. Mouth:  No deformity or lesions.   Lungs:  Clear throughout to  auscultation.  No wheezes, crackles, or rhonchi.  Heart:  Regular rate and rhythm; no murmurs, clicks, rubs, or gallops. Abdomen:  Soft, non-distended.  BS present.  Non-tender.  Scars noted on abdomen, well healed.  Ostomy noted in LLQ.  Rectal:  Deferred.  Msk:  Symmetrical without gross deformities. Pulses:  Normal pulses noted. Extremities:  Without clubbing or edema. Neurologic:  Alert and oriented x 4;  grossly normal neurologically. Skin:  Intact without significant lesions or rashes. Psych:  Alert and cooperative. Normal mood and affect.  Intake/Output from previous day: 04/20 0701 - 04/21 0700 In: 2914.3 [I.V.:607.5; IV Piggyback:2306.7] Out: -  Intake/Output this shift: Total I/O In: 610 [P.O.:610] Out: -   Lab Results: Recent Labs    12/19/19 1527 12/20/19 0642  WBC 15.7* 7.2  HGB 16.6 12.9*  HCT 49.7 40.3  PLT 325 255   BMET Recent Labs    12/19/19 1527 12/19/19 2314 12/20/19 0642  NA 132* 136 136  K 3.2* 3.1* 3.7  CL 100 111 113*  CO2 19* 14* 14*  GLUCOSE 126* 131* 135*  BUN 41* 35* 34*  CREATININE 3.12* 2.41* 2.34*  CALCIUM 8.8* 7.6* 8.1*   LFT Recent Labs    12/20/19 0642  PROT 5.9*  ALBUMIN 3.1*  AST 13*  ALT 12  ALKPHOS 83  BILITOT 1.1   Studies/Results: CT ABDOMEN PELVIS WO CONTRAST  Result Date: 12/19/2019  CLINICAL DATA:  Acute, generalized abdominal pain. Neutropenia. 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 History of Crohn's disease. EXAM: CT ABDOMEN AND PELVIS WITHOUT CONTRAST TECHNIQUE: Multidetector CT imaging of the abdomen and pelvis was performed following the standard protocol without IV contrast. COMPARISON:  01/17/2018. FINDINGS: Lower chest: Pronounced bullous changes in the inferior left upper lobe. Linear scarring in the left lower lobe. Normal sized heart. Hepatobiliary: No focal liver abnormality is seen. Status post cholecystectomy. No biliary dilatation.  Pancreas: Unremarkable. No pancreatic ductal dilatation or surrounding inflammatory changes. Spleen: Normal in size without focal abnormality. Adrenals/Urinary Tract: Adrenal glands are unremarkable. Kidneys are normal, without renal calculi, focal lesion, or hydronephrosis. Bladder is unremarkable. Stomach/Bowel: Unremarkable stomach and small bowel. Again demonstrated are changes of a subtotal colectomy a Hartmann's pouch. The previously demonstrated loop of ileum in the left upper and in the mid pelvis is less dilated than previously seen. There is progressive associated wall thickening, currently measuring 8 mm in maximum thickness. This is distal to an anastomosis in the left mid abdomen. Bowel at the anastomosis remains dilated without wall thickening. No inflammatory soft tissue stranding is seen. Vascular/Lymphatic: No significant vascular findings are present. No enlarged abdominal or pelvic lymph nodes. Reproductive: Prostate is unremarkable. Other: Left anterior mid pelvic ostomy. Musculoskeletal: Mild lumbar and lower thoracic spine degenerative changes. IMPRESSION: 1. Progressive wall thickening involving a loop of ileum in the left upper and mid pelvis, distal to an anastomosis in the left mid abdomen. This is compatible mildly progressive thickening associated with the patient's known Crohn's disease. A neoplastic process can not be excluded but is less likely. 2. 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 3. Pronounced bullous changes in the inferior left upper lobe. Electronically Signed   By: Claudie Revering M.D.   On: 12/19/2019 17:21   IMPRESSION:  *Complicated small bowel Crohn's disease / LLQ ostomy / B12 deficiency / Vitamin D deficiency / High risk medication use - h/o multiple resections, 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 Most recently on Stelara every 4 weeks and had been doing well for quite some time without hospitalization.  Last  dose was due 4/18, but he has not taken it because he was feeling so poorly.  Appears to have Crohn's flare with progressive thickening of a loop of ileum in the left mid-abdomen.  Already feeling better this AM and tolerated a good amount of clear liquids. *AKI:  Likely secondary to dehydration.  Improving with IV hydration.  PLAN: -Continue clears for now, but if still doing well then maybe can advance this afternoon after being seen by Dr. Loletha Carrow. -Continue IV solumedrol, but is receiving 125 mg every 6 hours.  May be able to decrease that dose.  Will d/w Dr. Loletha Carrow. -Continue IV cipro and flagyl for now. -He appears to have turned around quickly and hopefully will continue to improve.  If so then anticipate D/C with PO steroids with plans for taper in the next couple of days. -Resume Stelara upon discharge.   Laban Emperor. Zehr  12/20/2019, 9:39 AM   I have reviewed the entire case in detail with the above APP and discussed the plan in detail.  Therefore, I agree with the diagnoses recorded above. In addition,  I have personally interviewed and examined the patient and have personally reviewed any abdominal/pelvic CT scan images.  My additional thoughts are as follows:  The  additional history that I obtained from this patient was 5 to 6 days of mid abdominal pain with protracted vomiting and little or no ostomy output.  Since admission he has started having copious output which he attributed to the IV fluids. Based on his symptoms and my personal review of the CT scan (which is limited by lack of oral and IV contrast, but does seem to show convincing wall thickening in a loop of small bowel near the entero-enteral anastomosis), I believe he had a P SBO from a Crohn's flare in the small bowel, which is now opened up.  It is encouraging that he responded quickly to high-dose steroids.  Hopefully that means he can have a relatively short course of steroids after discharge.  His exam is benign today,  with no distention a little if any tenderness, and good bowel sounds.  He has opiates and mostly liquid stool in the ostomy. Infectious course is considered to be less likely, even with his dogs recent infectious illness.  [place] trips to the grocery store wearing full PPE.  He had his recent Covid shot first injection, but I do not think this is likely to have precipitated his symptoms.  Plan for now is as follows: #1 -we will message to the hospitalist because I think Mr. Milbourn needs an increased dose of IV fluids.  His creatinine is improved from what it was yesterday but has plateaued at over 2 with his baseline below 1.5. #2 -decrease Solu-Medrol to 40 mg IV every 8 hours.  If he continues to do well tomorrow, we will most likely either decrease that further or begin prednisone 40 mg daily in preparation for hopeful discharge soon. #3 -discontinue metronidazole and ciprofloxacin.  Orders have been placed, plan communicated to hospitalist physician.  All discussed with patient, he was satisfied with his clinical progress and care thus far. I will update Dr. Havery Moros, the patient's primary GI physician at our practice.  (Total time 70 minutes, extensive record review for complex condition required, face-to-face examination, care coordination, documentation and personal review of imaging.)   Nelida Meuse III Office:[phone]

## 2019-12-20 NOTE — Evaluation (Signed)
Physical Therapy Evaluation Patient Details Name: Gregory [name]. MRN: [id number] DOB: 1960/10/02 Today's Date: 12/20/2019   History of Present Illness  59 year old with history of Crohn's disease status post colostomy, depression, GERD presents with 4 days of weakness, nausea, headache and unable to tolerate p.o.   Clinical Impression  On eval, pt was Min Assist for mobility. Unsteady with ambulation. He relied heavily on the IV pole with both hands. Pt is weaker than his baseline currently. He is optimistic that he will be much better by the time he is discharged. In the meantime, I will recommend HHPT f/u and RW use for ambulation safety if pt is agreeable. Will continue to follow and progress activity as tolerated.     Follow Up Recommendations Home health PT    Equipment Recommendations  Rolling walker with 5" wheels    Recommendations for Other Services       Precautions / Restrictions Precautions Precautions: Fall Precaution Comments: prone to dizziness while walking at baseline Restrictions Weight Bearing Restrictions: No      Mobility  Bed Mobility Overal bed mobility: Modified Independent                Transfers Overall transfer level: Needs assistance               General transfer comment: min guard for safety, IV pole to come to standing  Ambulation/Gait Ambulation/Gait assistance: Min assist Gait Distance (Feet): 100 Feet Assistive device: IV Pole Gait Pattern/deviations: Step-through pattern;Decreased stride length;Trunk flexed     General Gait Details: Pt relied on IV pole quite a bit with both hands. Unsteady. Tolerated distance well.  Stairs            Wheelchair Mobility    Modified Rankin (Stroke Patients Only)       Balance Overall balance assessment: Needs assistance   Sitting balance-Leahy Scale: Good     Standing balance support: Bilateral upper extremity supported Standing balance-Leahy Scale: Poor                                Pertinent Vitals/Pain Pain Assessment: Faces Faces Pain Scale: Hurts little more Pain Location: abdomen, chronic Pain Descriptors / Indicators: Constant Pain Intervention(s): Monitored during session    Home Living Family/patient expects to be discharged to:: Private residence Living Arrangements: Alone Available Help at Discharge: Neighbor;Available PRN/intermittently;Friend(s) Type of Home: Mobile home Home Access: Stairs to enter Entrance Stairs-Rails: Right;Left   Home Layout: One level Home Equipment: Cane - single point      Prior Function Level of Independence: Independent with assistive device(s)         Comments: cane at baseline somedays "I have good days and bad days. I use it on my bad days.     Hand Dominance        Extremity/Trunk Assessment   Upper Extremity Assessment Upper Extremity Assessment: Defer to OT evaluation    Lower Extremity Assessment Lower Extremity Assessment: Generalized weakness    Cervical / Trunk Assessment Cervical / Trunk Assessment: Normal  Communication   Communication: No difficulties  Cognition Arousal/Alertness: Awake/alert Behavior During Therapy: WFL for tasks assessed/performed Overall Cognitive Status: Within Functional Limits for tasks assessed                                        General  Comments      Exercises     Assessment/Plan    PT Assessment Patient needs continued PT services  PT Problem List Decreased balance;Decreased strength;Decreased mobility;Decreased knowledge of use of DME       PT Treatment Interventions DME instruction;Gait training;Therapeutic activities;Therapeutic exercise;Patient/family education;Balance training;Functional mobility training    PT Goals (Current goals can be found in the Care Plan section)  Acute Rehab PT Goals Patient Stated Goal: feel better, get strength back, progress diet, return home PT Goal Formulation:  With patient Time For Goal Achievement: 01/03/20 Potential to Achieve Goals: Good    Frequency Min 3X/week   Barriers to discharge        Co-evaluation               AM-PAC PT "6 Clicks" Mobility  Outcome Measure Help needed turning from your back to your side while in a flat bed without using bedrails?: None Help needed moving from lying on your back to sitting on the side of a flat bed without using bedrails?: None Help needed moving to and from a bed to a chair (including a wheelchair)?: A Little Help needed standing up from a chair using your arms (e.g., wheelchair or bedside chair)?: A Little Help needed to walk in hospital room?: A Little Help needed climbing 3-5 steps with a railing? : A Little 6 Click Score: 20    End of Session Equipment Utilized During Treatment: Gait belt Activity Tolerance: Patient tolerated treatment well Patient left: in bed;with call bell/phone within reach;with bed alarm set   PT Visit Diagnosis: Muscle weakness (generalized) (M62.81);Difficulty in walking, not elsewhere classified (R26.2)    Time: 0932-3557 PT Time Calculation (min) (ACUTE ONLY): 8 min   Charges:   PT Evaluation $PT Eval Low Complexity: 1 Low           Oris Staffieri P, PT Acute Rehabilitation

## 2019-12-20 NOTE — Evaluation (Signed)
Occupational Therapy Evaluation Patient Details Name: Gregory [name]. MRN: [id number] DOB: 11-15-1960 Today's Date: 12/20/2019    History of Present Illness 59 year old with history of Crohn's disease status post colostomy, depression, GERD presents with 4 days of weakness, nausea, headache and unable to tolerate p.o.    Clinical Impression   Pt admitted with the above diagnoses and presents with below problem list. Pt will benefit from continued acute OT to address the below listed deficits and maximize independence with basic ADLs prior to d/c home. PTA pt was mod I with ADLs. Pt currently min guard A with LB ADLs and functional mobility. Pt with onset of dizziness while walking bathroom distance. He reports this is baseline. Began education on energy conservation strategies and safety with ADLs at home.      Follow Up Recommendations  Home health OT;Supervision - Intermittent    Equipment Recommendations  3 in 1 bedside commode    Recommendations for Other Services PT consult     Precautions / Restrictions Precautions Precaution Comments: prone to dizziness while walking at baseline Restrictions Weight Bearing Restrictions: No      Mobility Bed Mobility Overal bed mobility: Needs Assistance Bed Mobility: Supine to Sit;Sit to Supine     Supine to sit: Supervision Sit to supine: Supervision   General bed mobility comments: supervision for safety  Transfers Overall transfer level: Needs assistance Equipment used: Rolling walker (2 wheeled);None;1 person hand held assist Transfers: Sit to/from Stand Sit to Stand: Min guard         General transfer comment: min guard for safety, IV pole to come to standing, rw to return to sitting. Pt stated preference for rw.     Balance Overall balance assessment: Needs assistance Sitting-balance support: No upper extremity supported;Bilateral upper extremity supported Sitting balance-Leahy Scale: Fair     Standing balance  support: Bilateral upper extremity supported;Single extremity supported Standing balance-Leahy Scale: Poor Standing balance comment: single extremity support in static stand                           ADL either performed or assessed with clinical judgement   ADL Overall ADL's : Needs assistance/impaired Eating/Feeding: Set up;Sitting   Grooming: Set up;Sitting   Upper Body Bathing: Set up;Sitting   Lower Body Bathing: Min guard;Sit to/from stand   Upper Body Dressing : Set up;Sitting   Lower Body Dressing: Min guard;Sit to/from stand   Toilet Transfer: Min guard;Ambulation;RW   Toileting- Water quality scientist and Hygiene: Min guard;Sit to/from stand   Tub/ Shower Transfer: Min guard;Ambulation;Rolling walker;Shower seat   Functional mobility during ADLs: Min guard;Rolling walker(vs IV pole) General ADL Comments: Pt completed 2x bathroom distance functional mobility with seated rest break incorporated 2/2 onset of dizziness. Trialed rw as pt holding onto IV pole with both hands. Began education on energy conservation strategies during ADLs/IADLs     Vision         Perception     Praxis      Pertinent Vitals/Pain Pain Assessment: Faces Faces Pain Scale: Hurts little more Pain Location: abdomen, chronic Pain Descriptors / Indicators: Constant Pain Intervention(s): Limited activity within patient's tolerance;Monitored during session;Repositioned     Hand Dominance Right   Extremity/Trunk Assessment Upper Extremity Assessment Upper Extremity Assessment: Generalized weakness   Lower Extremity Assessment Lower Extremity Assessment: Defer to PT evaluation;Generalized weakness       Communication Communication Communication: No difficulties   Cognition Arousal/Alertness: Awake/alert Behavior During Therapy:  WFL for tasks assessed/performed Overall Cognitive Status: Within Functional Limits for tasks assessed                                      General Comments  Has a dog named Izzy he is fond of.     Exercises     Shoulder Instructions      Home Living Family/patient expects to be discharged to:: Private residence Living Arrangements: Alone Available Help at Discharge: Neighbor;Available PRN/intermittently;Friend(s)(church friends and a helpful neighbor) Type of Home: Mobile home Home Access: Stairs to enter   Entrance Stairs-Rails: Right;Left Home Layout: One level     Bathroom Shower/Tub: Teacher, early years/pre: Standard     Home Equipment: Cane - single point          Prior Functioning/Environment Level of Independence: Independent with assistive device(s)        Comments: cane at baseline somedays "I have good days and bad days. I use it on my bad days.        OT Problem List: Decreased strength;Decreased activity tolerance;Impaired balance (sitting and/or standing);Decreased knowledge of use of DME or AE;Decreased knowledge of precautions;Pain      OT Treatment/Interventions: Self-care/ADL training;Therapeutic exercise;Energy conservation;DME and/or AE instruction;Therapeutic activities;Patient/family education;Balance training    OT Goals(Current goals can be found in the care plan section) Acute Rehab OT Goals Patient Stated Goal: feel better, get strength back, progress diet, return home OT Goal Formulation: With patient Time For Goal Achievement: 01/03/20 Potential to Achieve Goals: Good ADL Goals Pt Will Perform Grooming: with modified independence;sitting;standing Pt Will Perform Upper Body Bathing: with set-up;sitting Pt Will Perform Lower Body Bathing: with modified independence;sit to/from stand Pt Will Transfer to Toilet: with modified independence;ambulating Pt Will Perform Toileting - Clothing Manipulation and hygiene: with modified independence;sit to/from stand Pt Will Perform Tub/Shower Transfer: Shower transfer;with modified independence;ambulating;3 in  1;rolling walker  OT Frequency: Min 2X/week   Barriers to D/C:            Co-evaluation              AM-PAC OT "6 Clicks" Daily Activity     Outcome Measure Help from another person eating meals?: None Help from another person taking care of personal grooming?: None Help from another person toileting, which includes using toliet, bedpan, or urinal?: A Little Help from another person bathing (including washing, rinsing, drying)?: A Little Help from another person to put on and taking off regular upper body clothing?: None Help from another person to put on and taking off regular lower body clothing?: A Little 6 Click Score: 21   End of Session Equipment Utilized During Treatment: Rolling walker;Other (comment)(vs IV pole)  Activity Tolerance: Patient limited by fatigue;Other (comment)(dizzy walking in room) Patient left: in bed;with call bell/phone within reach;with bed alarm set  OT Visit Diagnosis: Unsteadiness on feet (R26.81);Muscle weakness (generalized) (M62.81);Pain                Time: 1210-1230 OT Time Calculation (min): 20 min Charges:  OT General Charges $OT Visit: 1 Visit OT Evaluation $OT Eval Low Complexity: [place], OT Acute Rehabilitation Services Pager: [phone] Office: [phone]   Hortencia Pilar 12/20/2019, 1:47 PM

## 2019-12-21 DIAGNOSIS — K50819 Crohn's disease of both small and large intestine with unspecified complications: Secondary | ICD-10-CM

## 2019-12-21 LAB — BASIC METABOLIC PANEL
Anion gap: 5 (ref 5–15)
BUN: 30 mg/dL — ABNORMAL HIGH (ref 6–20)
CO2: 15 mmol/L — ABNORMAL LOW (ref 22–32)
Calcium: 8.2 mg/dL — ABNORMAL LOW (ref 8.9–10.3)
Chloride: 119 mmol/L — ABNORMAL HIGH (ref 98–111)
Creatinine, Ser: 2.54 mg/dL — ABNORMAL HIGH (ref 0.61–1.24)
GFR calc Af Amer: 31 mL/min — ABNORMAL LOW (ref >60)
GFR calc non Af Amer: 27 mL/min — ABNORMAL LOW (ref >60)
Glucose, Bld: 118 mg/dL — ABNORMAL HIGH (ref 70–99)
Potassium: 3.4 mmol/L — ABNORMAL LOW (ref 3.5–5.1)
Sodium: 139 mmol/L (ref 135–145)

## 2019-12-21 LAB — CBC
HCT: 33.1 % — ABNORMAL LOW (ref 39.0–52.0)
Hemoglobin: 11 g/dL — ABNORMAL LOW (ref 13.0–17.0)
MCH: 30.2 pg (ref 26.0–34.0)
MCHC: 33.2 g/dL (ref 30.0–36.0)
MCV: 90.9 fL (ref 80.0–100.0)
Platelets: 235 10*3/uL (ref 150–400)
RBC: 3.64 MIL/uL — ABNORMAL LOW (ref 4.22–5.81)
RDW: 15.3 % (ref 11.5–15.5)
WBC: 28.3 10*3/uL — ABNORMAL HIGH (ref 4.0–10.5)
nRBC: 0 % (ref 0.0–0.2)

## 2019-12-21 LAB — GLUCOSE, CAPILLARY
Glucose-Capillary: 122 mg/dL — ABNORMAL HIGH (ref 70–99)
Glucose-Capillary: 115 mg/dL — ABNORMAL HIGH (ref 70–99)
Glucose-Capillary: 135 mg/dL — ABNORMAL HIGH (ref 70–99)
Glucose-Capillary: 123 mg/dL — ABNORMAL HIGH (ref 70–99)

## 2019-12-21 LAB — LACTIC ACID, PLASMA: Lactic Acid, Venous: 1.8 mmol/L (ref 0.5–1.9)

## 2019-12-21 LAB — MAGNESIUM: Magnesium: 1.7 mg/dL (ref 1.7–2.4)

## 2019-12-21 MED ORDER — SODIUM CHLORIDE 0.9 % IV SOLN: INTRAVENOUS | Status: AC

## 2019-12-21 MED ORDER — BOOST / RESOURCE BREEZE PO LIQD CUSTOM: 1.0000 | Freq: Three times a day (TID) | ORAL | Status: DC

## 2019-12-21 MED ORDER — ALUM & MAG HYDROXIDE-SIMETH 200-200-20 MG/5ML PO SUSP
30.0000 mL | ORAL | Status: DC | PRN
  Administered 2019-12-21: 30 mL via ORAL
  Filled 2019-12-21: qty 30

## 2019-12-21 MED ORDER — PANTOPRAZOLE SODIUM 40 MG PO TBEC
40.0000 mg | DELAYED_RELEASE_TABLET | Freq: Every day | ORAL | Status: DC
  Administered 2019-12-22: 40 mg via ORAL
  Filled 2019-12-21: qty 1

## 2019-12-21 MED ORDER — PREDNISONE 20 MG PO TABS
40.0000 mg | ORAL_TABLET | Freq: Every day | ORAL | Status: DC
  Administered 2019-12-22: 40 mg via ORAL
  Filled 2019-12-21: qty 2

## 2019-12-21 NOTE — Progress Notes (Signed)
Initial Nutrition Assessment  DOCUMENTATION CODES:   Underweight  INTERVENTION:  Boost Breeze po TID, each supplement provides 250 kcal and 9 grams of protein  Continue MVI daily  Monitor magnesium, potassium, and phosphorus, MD to replete as needed, as pt is at risk for refeeding syndrome given hypokalemia and magnesium trending down per labs.   NUTRITION DIAGNOSIS:   Inadequate oral intake related to acute illness, chronic illness(acute exacerbation of Crohn's disease) as evidenced by (per chart, pt reports 4 day history of increased ostomy output, significant nausea and unable to tolerate po and 0% po intake x 3 documented meals on 4/21).    GOAL:   Patient will meet greater than or equal to 90% of their needs    MONITOR:   Labs, I & O's, Supplement acceptance, Weight trends, PO intake  REASON FOR ASSESSMENT:   Consult, Malnutrition Screening Tool Malnutrition Eval  ASSESSMENT:  RD working remotely.  59 year old male with past medical history of Crohn's disease s/p colostomy, GERD, and depression presented with 4 day history of increased ostomy output and reports feeling weak, dehydrated, having significant nausea and headache.  Patient admitted on 4/20 for acute exacerbation of Crohn's disease.  Attempted to reach pt via phone this afternoon, however he did not pick up. Per notes, pt stated that he felt like a new man today, and tolerated 100% of soft breakfast, noted good ostomy output. Pt tolerated CLD yesterday, diet advanced to FL at 1316 and advanced to soft last night at 2102. Per flowsheets, he consumed 0% of lunch and dinner yesterday. Will provide Boost Breeze supplement to aid with needs.  Current wt 117.92 lbs Limited recent wt history available for review, on 04/10/19 pt weighed 123.64 lbs and 117-125 from January - November 2019. Per history, pt is chronically underweight and given history of Crohn's disease highly suspect malnutrition, however unable to  identify at this time.   Medications reviewed and include: SSI, Methylprednisolone IVF: NaCl Labs: CBGs 122,159,139,169, K 3.4 (L), BUN 30 (H), Cr 2.54 (H), Mg 1.7 (WNL) trending down, WBC 28.3 (H)  NUTRITION - FOCUSED PHYSICAL EXAM: Unable to complete at this time, RD working remotely.  Diet Order:   Diet Order            DIET SOFT Room service appropriate? Yes; Fluid consistency: Thin  Diet effective now              EDUCATION NEEDS:   No education needs have been identified at this time  Skin:  Skin Assessment: Reviewed RN Assessment  Last BM:  4/22  Height:   Ht Readings from Last 1 Encounters:  12/19/19 5' 11"  (1.803 m)    Weight:   Wt Readings from Last 1 Encounters:  12/19/19 53.6 kg    Ideal Body Weight:     BMI:  Body mass index is 16.49 kg/m.  Estimated Nutritional Needs:   Kcal:  1800-2000  Protein:  90-100  Fluid:  >/= 1.8 L/day   Lajuan Lines, RD, LDN Clinical Nutrition After Hours/Weekend Pager # in [place]

## 2019-12-21 NOTE — Progress Notes (Signed)
PROGRESS NOTE    Gregory [name].  SAY:[id number] DOB: January 29, 1961 DOA: 12/19/2019 PCP: Billie Ruddy, MD   Brief Narrative:  59 year old with history of Crohn's disease status post colostomy, depression, GERD presents with 4 days of weakness, nausea, headache and unable to tolerate p.o.  He went to GI office yesterday was sent to ER.  Recently had COVID-19 vaccination.  GI Dr. Tarri Glenn called ER recommended Solu-Medrol 125 mg every 6 hours, Cipro and Flagyl.  CT abdomen pelvis showed thickened loop of ileum.  Also noted to be in acute kidney injury likely from dehydration   Assessment & Plan:   Active Problems:   Nausea & vomiting   Protein calorie malnutrition (HCC)   GERD (gastroesophageal reflux disease)   Hypokalemia   AKI (acute kidney injury) ([place])   Crohn's disease of both small and lg int w unsp comp ([place])   Dehydration   Exacerbation of Crohn's disease of small intestine (HCC)  nausea vomiting, nonbloody acute exacerbation of Crohn's disease, colostomy in place -Cipro Flagyl stopped 4/21 -Solu-Medrol 40 mg every 8 hours, p.o. starting tomorrow -diet as tolerated -GI following -supportive care -if develops diarrhea, rule out C. difficile  acute kidney injury secondary to dehydration, prerenal moderate to severe dehydration Metabolic acidosis, GI losses -Baseline creatinine 1.2, admission creatinine 3.12.  This morning slightly worse at 2.54 -Suspect some acidosis from GI losses.  Switch fluids to normal saline 150 cc/h. -Check lactic acid  -hydration, monitor renal function  Leukocytosis -Suspect secondary to steroid use.  No obvious signs of infection at this time.  moderate protein calorie malnutrition -dietitian consulted  GERD -PPI  depression -resume home meds when able to tolerate p.o.  DVT prophylaxis: SCDs Code Status: Full Family Communication: None Disposition Plan:   Patient From= home  Patient Anticipated D/C place= home  Barriers=  maintain hospital stay for IV steroids.  Need to closely monitor renal function get aggressive IV fluids.  Unsafe for discharge today.  Probably will be here for another 1-2 days  Subjective: Walking the hallway without any issues.  He is in good spirits.  Abdominal pain has improved  Review of Systems Otherwise negative except as per HPI, including: General: Denies fever, chills, night sweats or unintended weight loss. Resp: Denies cough, wheezing, shortness of breath. Cardiac: Denies chest pain, palpitations, orthopnea, paroxysmal nocturnal dyspnea. GI: Denies abdominal pain, nausea, vomiting, diarrhea or constipation GU: Denies dysuria, frequency, hesitancy or incontinence MS: Denies muscle aches, joint pain or swelling Neuro: Denies headache, neurologic deficits (focal weakness, numbness, tingling), abnormal gait Psych: Denies anxiety, depression, SI/HI/AVH Skin: Denies new rashes or lesions ID: Denies sick contacts, exotic exposures, travel  Examination: Constitutional: Not in acute distress Respiratory: Clear to auscultation bilaterally Cardiovascular: Normal sinus rhythm, no rubs Abdomen: Nontender nondistended good bowel sounds Musculoskeletal: No edema noted Skin: No rashes seen Neurologic: CN 2-12 grossly intact.  And nonfocal Psychiatric: Normal judgment and insight. Alert and oriented x 3. Normal mood.  Ostomy bag noted  Objective: Vitals:   12/20/19 1411 12/20/19 1414 12/20/19 2046 12/21/19 0632  BP: (!) 93/52 101/67 99/66 97/65   Pulse: (!) 59 (!) 55 65 60  Resp: 15  16 18   Temp: 98.7 F (37.1 C)  98.2 F (36.8 C) 97.7 F (36.5 C)  TempSrc: Oral  Oral Oral  SpO2: 99%  98% 99%  Weight:      Height:        Intake/Output Summary (Last 24 hours) at 12/21/2019 0756 Last data filed  at 12/21/2019 0306 Gross per 24 hour  Intake 3293.11 ml  Output --  Net 3293.11 ml   Filed Weights   12/19/19 1430  Weight: 53.6 kg     Data Reviewed:   CBC: Recent Labs   Lab 12/19/19 1527 12/20/19 0642 12/21/19 0601  WBC 15.7* 7.2 28.3*  NEUTROABS  --  6.4  --   HGB 16.6 12.9* 11.0*  HCT 49.7 40.3 33.1*  MCV 88.4 92.2 90.9  PLT 325 255 468   Basic Metabolic Panel: Recent Labs  Lab 12/19/19 1527 12/19/19 2314 12/20/19 0642 12/21/19 0601  NA 132* 136 136 139  K 3.2* 3.1* 3.7 3.4*  CL 100 111 113* 119*  CO2 19* 14* 14* 15*  GLUCOSE 126* 131* 135* 118*  BUN 41* 35* 34* 30*  CREATININE 3.12* 2.41* 2.34* 2.54*  CALCIUM 8.8* 7.6* 8.1* 8.2*  MG 1.7  --  2.0 1.7  PHOS 4.5  --  3.3  --    GFR: Estimated Creatinine Clearance: 24 mL/min (A) (by C-G formula based on SCr of 2.54 mg/dL (H)). Liver Function Tests: Recent Labs  Lab 12/19/19 1527 12/20/19 0642  AST 21 13*  ALT 15 12  ALKPHOS 112 83  BILITOT 1.3* 1.1  PROT 8.1 5.9*  ALBUMIN 4.1 3.1*   Recent Labs  Lab 12/19/19 1527  LIPASE 58*   No results for input(s): AMMONIA in the last 168 hours. Coagulation Profile: No results for input(s): INR, PROTIME in the last 168 hours. Cardiac Enzymes: Recent Labs  Lab 12/19/19 1527  CKTOTAL 80   BNP (last 3 results) No results for input(s): PROBNP in the last 8760 hours. HbA1C: Recent Labs    12/20/19 0624  HGBA1C 5.7*   CBG: Recent Labs  Lab 12/20/19 0839 12/20/19 1316 12/20/19 1608 12/20/19 2048 12/21/19 0738  GLUCAP 120* 169* 139* 159* 122*   Lipid Profile: No results for input(s): CHOL, HDL, LDLCALC, TRIG, CHOLHDL, LDLDIRECT in the last 72 hours. Thyroid Function Tests: Recent Labs    12/20/19 0642  TSH 0.611   Anemia Panel: No results for input(s): VITAMINB12, FOLATE, FERRITIN, TIBC, IRON, RETICCTPCT in the last 72 hours. Sepsis Labs: Recent Labs  Lab 12/19/19 1802  LATICACIDVEN 1.8    Recent Results (from the past 240 hour(s))  Respiratory Panel by RT PCR (Flu A&B, Covid) - Nasopharyngeal Swab     Status: None   Collection Time: 12/19/19  6:22 PM   Specimen: Nasopharyngeal Swab  Result Value Ref Range  Status   SARS Coronavirus 2 by RT PCR NEGATIVE NEGATIVE Final    Comment: (NOTE) SARS-CoV-2 target nucleic acids are NOT DETECTED. The SARS-CoV-2 RNA is generally detectable in upper respiratoy specimens during the acute phase of infection. The lowest concentration of SARS-CoV-2 viral copies this assay can detect is 131 copies/mL. A negative result does not preclude SARS-Cov-2 infection and should not be used as the sole basis for treatment or other patient management decisions. A negative result may occur with  improper specimen collection/handling, submission of specimen other than nasopharyngeal swab, presence of viral mutation(s) within the areas targeted by this assay, and inadequate number of viral copies (<131 copies/mL). A negative result must be combined with clinical observations, patient history, and epidemiological information. The expected result is Negative. Fact Sheet for Patients:  PinkCheek.be Fact Sheet for Healthcare Providers:  GravelBags.it This test is not yet ap proved or cleared by the [place] FDA and  has been authorized for detection and/or diagnosis of SARS-CoV-2 by  FDA under an Emergency Use Authorization (EUA). This EUA will remain  in effect (meaning this test can be used) for the duration of the COVID-19 declaration under Section 564(b)(1) of the Act, 21 U.S.C. section 360bbb-3(b)(1), unless the authorization is terminated or revoked sooner.    Influenza A by PCR NEGATIVE NEGATIVE Final   Influenza B by PCR NEGATIVE NEGATIVE Final    Comment: (NOTE) The Xpert Xpress SARS-CoV-2/FLU/RSV assay is intended as an aid in  the diagnosis of influenza from Nasopharyngeal swab specimens and  should not be used as a sole basis for treatment. Nasal washings and  aspirates are unacceptable for Xpert Xpress SARS-CoV-2/FLU/RSV  testing. Fact Sheet for  Patients: PinkCheek.be Fact Sheet for Healthcare Providers: GravelBags.it This test is not yet approved or cleared by the [place] FDA and  has been authorized for detection and/or diagnosis of SARS-CoV-2 by  FDA under an Emergency Use Authorization (EUA). This EUA will remain  in effect (meaning this test can be used) for the duration of the  Covid-19 declaration under Section 564(b)(1) of the Act, 21  U.S.C. section 360bbb-3(b)(1), unless the authorization is  terminated or revoked. Performed at [facility], [place] [address], [place], [place] [address]          Radiology Studies: CT ABDOMEN PELVIS WO CONTRAST  Result Date: 12/19/2019 CLINICAL DATA:  Acute, generalized abdominal pain. Neutropenia. 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 History of Crohn's disease. EXAM: CT ABDOMEN AND PELVIS WITHOUT CONTRAST TECHNIQUE: Multidetector CT imaging of the abdomen and pelvis was performed following the standard protocol without IV contrast. COMPARISON:  01/17/2018. FINDINGS: Lower chest: Pronounced bullous changes in the inferior left upper lobe. Linear scarring in the left lower lobe. Normal sized heart. Hepatobiliary: No focal liver abnormality is seen. Status post cholecystectomy. No biliary dilatation. Pancreas: Unremarkable. No pancreatic ductal dilatation or surrounding inflammatory changes. Spleen: Normal in size without focal abnormality. Adrenals/Urinary Tract: Adrenal glands are unremarkable. Kidneys are normal, without renal calculi, focal lesion, or hydronephrosis. Bladder is unremarkable. Stomach/Bowel: Unremarkable stomach and small bowel. Again demonstrated are changes of a subtotal colectomy a Hartmann's pouch. The previously demonstrated loop of ileum in the left upper and in the mid pelvis is less dilated than previously seen. There is  progressive associated wall thickening, currently measuring 8 mm in maximum thickness. This is distal to an anastomosis in the left mid abdomen. Bowel at the anastomosis remains dilated without wall thickening. No inflammatory soft tissue stranding is seen. Vascular/Lymphatic: No significant vascular findings are present. No enlarged abdominal or pelvic lymph nodes. Reproductive: Prostate is unremarkable. Other: Left anterior mid pelvic ostomy. Musculoskeletal: Mild lumbar and lower thoracic spine degenerative changes. IMPRESSION: 1. Progressive wall thickening involving a loop of ileum in the left upper and mid pelvis, distal to an anastomosis in the left mid abdomen. This is compatible mildly progressive thickening associated with the patient's known Crohn's disease. A neoplastic process can not be excluded but is less likely. 2. 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 3. Pronounced bullous changes in the inferior left upper lobe. Electronically Signed   By: Claudie Revering M.D.   On: 12/19/2019 17:21        Scheduled Meds:  insulin aspart  0-5 Units Subcutaneous QHS   insulin aspart  0-9 Units Subcutaneous TID WC   methylPREDNISolone (SOLU-MEDROL) injection  40 mg Intravenous Q8H   sodium chloride flush  3 mL Intravenous Once   sodium chloride flush  3 mL Intravenous Q12H   Continuous Infusions:  sodium chloride       LOS: 2 days   Time spent= 25 mins    Alexia Dinger Arsenio Loader, MD [facility]  If 7PM-7AM, please contact night-coverage  12/21/2019, 7:56 AM

## 2019-12-21 NOTE — Progress Notes (Signed)
Physical Therapy Treatment Patient Details Name: Gregory [name]. MRN: [id number] DOB: 1961-05-17 Today's Date: 12/21/2019    History of Present Illness 58 year old with history of Crohn's disease status post colostomy, depression, GERD presents with 4 days of weakness, nausea, headache and unable to tolerate p.o.     PT Comments    Patient is much improved today with mobility. He was able to complete bed mob, transfers, and gait at Mod Independent to Supervision level. Patient no longer requires external support to maintain balance during dynamic standing. He scored a 22/24 on the DGI indicating he is not at risk for falls and performed 5x Sit<>Stand in 14.95 seconds just slightly slower than age range norm. He no longer requires skilled Acute PT services at this time and has met Acute PT goals. Updating discharge recommendation to OPPT if pt feels a need to further address weakness once he returns home. Acute PT will sign of and pt is safe to mobilize with RN staff. Please re-consult if there is a change in functional status.   Follow Up Recommendations  Outpatient PT     Equipment Recommendations  None recommended by PT    Recommendations for Other Services       Precautions / Restrictions Precautions Precautions: Fall Precaution Comments: prone to dizziness while walking at baseline Restrictions Weight Bearing Restrictions: No    Mobility  Bed Mobility Overal bed mobility: Modified Independent Bed Mobility: Supine to Sit;Sit to Supine     Supine to sit: Modified independent (Device/Increase time) Sit to supine: Modified independent (Device/Increase time)   General bed mobility comments: no assist, pt safe with mobility  Transfers Overall transfer level: Modified independent Equipment used: None Transfers: Sit to/from Stand Sit to Stand: Modified independent (Device/Increase time)         General transfer comment: no equipment needed, pt safe with power up and  rise.  Ambulation/Gait Ambulation/Gait assistance: Supervision Gait Distance (Feet): 380 Feet Assistive device: None Gait Pattern/deviations: Trunk flexed;WFL(Within Functional Limits) Gait velocity: WFL's   General Gait Details: pt independent with no device. able to maintain steady pace and balance with dynamic gait challenges (see high level balance assessment)   Stairs      Wheelchair Mobility    Modified Rankin (Stroke Patients Only)       Balance Overall balance assessment: Needs assistance Sitting-balance support: No upper extremity supported Sitting balance-Leahy Scale: Normal     Standing balance support: During functional activity;No upper extremity supported Standing balance-Leahy Scale: Good   Single Leg Stance - Right Leg: 4 Single Leg Stance - Left Leg: 4        Standardized Balance Assessment Standardized Balance Assessment : Dynamic Gait Index   Dynamic Gait Index Level Surface: Normal Change in Gait Speed: Normal Gait with Horizontal Head Turns: Normal Gait with Vertical Head Turns: Normal Gait and Pivot Turn: Normal Step Over Obstacle: Normal Step Around Obstacles: Mild Impairment Steps: Mild Impairment Total Score: 22      Cognition Arousal/Alertness: Awake/alert Behavior During Therapy: WFL for tasks assessed/performed Overall Cognitive Status: Within Functional Limits for tasks assessed         Exercises      General Comments General comments (skin integrity, edema, etc.): 5x Sit to Stand: 14.95 seconds with no UE use and no use of momentum.       Pertinent Vitals/Pain Pain Assessment: No/denies pain           PT Goals (current goals can now be found in the  care plan section) Acute Rehab PT Goals Patient Stated Goal: feel better, get strength back, progress diet, return home PT Goal Formulation: With patient Time For Goal Achievement: 12/21/19 Potential to Achieve Goals: Good Progress towards PT goals: Goals  met/education completed, patient discharged from PT    Frequency    Min 3X/week      PT Plan Discharge plan needs to be updated;Equipment recommendations need to be updated       AM-PAC PT "6 Clicks" Mobility   Outcome Measure  Help needed turning from your back to your side while in a flat bed without using bedrails?: None Help needed moving from lying on your back to sitting on the side of a flat bed without using bedrails?: None Help needed moving to and from a bed to a chair (including a wheelchair)?: None Help needed standing up from a chair using your arms (e.g., wheelchair or bedside chair)?: None Help needed to walk in hospital room?: None Help needed climbing 3-5 steps with a railing? : A Little 6 Click Score: 23    End of Session Equipment Utilized During Treatment: Gait belt Activity Tolerance: Patient tolerated treatment well Patient left: in bed;with call bell/phone within reach Nurse Communication: Mobility status PT Visit Diagnosis: Muscle weakness (generalized) (M62.81);Difficulty in walking, not elsewhere classified (R26.2)     Time: 5366-4403 PT Time Calculation (min) (ACUTE ONLY): 18 min  Charges:  $Gait Training: 8-22 mins                     Verner Mould, DPT Physical Therapist with [facility] [phone]  12/21/2019 12:48 PM

## 2019-12-21 NOTE — Progress Notes (Addendum)
Primary Care Physician:  Billie Ruddy, MD Primary Gastroenterologist:  Dr. Havery Moros  Reason for Consultation:  Crohn's flare, ? PSBO  Subjective:  "I feel almost like a new man!"  Tolerated an entire tray of soft food for breakfast.  Ostomy output is good.  Renal function unchanged and Cr still elevated.  Abx discontinued yesterday and IV steroids decreased.   Past Medical History:  Diagnosis Date  . Acute endocarditis   . Crohn's disease ([place])   . Depression   . Eating disorder   . GERD (gastroesophageal reflux disease)   . Kidney stones   . Short gut syndrome     Past Surgical History:  Procedure Laterality Date  . COLONOSCOPY WITH PROPOFOL N/A 09/17/2017   Procedure: COLONOSCOPY WITH PROPOFOL;  Surgeon: [name], MD;  Location: [facility];  Service: Endoscopy;  Laterality: N/A;  Through ileostomy.  [name] WITH STENT PLACEMENT Left 05/20/2017   Procedure: CYSTOSCOPY WITH STENT PLACEMENT;  Surgeon: [name], MD;  Location: [facility];  Service: Urology;  Laterality: Left;  . ILEOSTOMY    . IR NEPHROSTOMY EXCHANGE LEFT  04/12/2017  . IR NEPHROSTOMY PLACEMENT LEFT  04/12/2017  . IR NEPHROSTOMY PLACEMENT LEFT  04/29/2017  . NEPHROLITHOTOMY Left 05/14/2017   Procedure: LEFT NEPHROLITHOTOMY PERCUTANEOUS WITH SURGEON ACCESS;  Surgeon: [name], MD;  Location: [facility];  Service: Urology;  Laterality: Left;  . SMALL INTESTINE SURGERY    . SUBTOTAL COLECTOMY      Prior to Admission medications   Medication Sig Start Date End Date Taking? Authorizing Provider  acetaminophen (TYLENOL) 500 MG tablet Take 500 mg by mouth every 6 (six) hours as needed for moderate pain.   Yes [provider]  diphenoxylate-atropine (LOMOTIL) 2.5-0.025 MG tablet TAKE 1 TABLET 4 TIMES A DAY AS NEEDED FOR DIARRHEA OR LOOSE STOOLS Patient taking differently: Take 1 tablet by mouth 4 (four) times daily as needed for diarrhea or loose stools.  12/15/19  Yes  Armbruster, Carlota Raspberry, MD  Multiple Vitamin (MULTIVITAMIN WITH MINERALS) TABS tablet Take 1 tablet by mouth daily. 05/03/17  Yes Rai, Ripudeep K, MD  ondansetron (ZOFRAN-ODT) 4 MG disintegrating tablet TAKE ONE TABLET BY MOUTH EVERY 8 HOURS AS NEEDED FOR NAUSEA AND VOMITING Patient taking differently: Take 4 mg by mouth every 8 (eight) hours as needed for nausea or vomiting.  10/12/18  Yes Armbruster, Carlota Raspberry, MD  potassium chloride (KLOR-CON) 20 MEQ packet Take 20 mEq by mouth daily. Patient taking differently: Take 20 mEq by mouth See admin instructions. Patient is taking one on Monday and one on Thursday 01/30/19  Yes Armbruster, Carlota Raspberry, MD  sertraline (ZOLOFT) 50 MG tablet Take 1 tablet (50 mg total) by mouth daily. Patient taking differently: Take 50 mg by mouth daily as needed (down spells?).  08/26/17  Yes Billie Ruddy, MD  traMADol (ULTRAM) 50 MG tablet Take 1 tablet (50 mg total) by mouth every 8 (eight) hours as needed. 10/20/19  Yes Armbruster, Carlota Raspberry, MD  ustekinumab (STELARA) 90 MG/ML SOSY injection Inject 1 mL (90 mg total) into the skin every 8 (eight) weeks. Patient taking differently: Inject 90 mg into the skin every 30 (thirty) days.  11/07/18  Yes Armbruster, Carlota Raspberry, MD  Vitamin D, Ergocalciferol, 2000 units CAPS Take 1 capsule by mouth daily.   Yes [provider]  cholestyramine light (PREVALITE) 4 g packet Take 1 packet (4 g total) by mouth daily. Patient not taking: Reported on  12/19/2019 01/21/18   Lavina Hamman, MD  cyanocobalamin (,VITAMIN B-12,) 1000 MCG/ML injection Inject 1 mL (1,000 mcg total) into the muscle once a week. Inject once a wk. for 4 weeks, then once a month Patient not taking: Reported on 12/19/2019 04/10/19   Yetta Flock, MD  ferrous sulfate 325 (65 FE) MG tablet TAKE 1 TABLET (325 MG TOTAL) 2 (TWO) TIMES DAILY WITH A MEAL BY MOUTH. Patient not taking: Reported on 12/19/2019 11/15/17   Yetta Flock, MD  ipratropium (ATROVENT) 0.03  % nasal spray Place 2 sprays 2 (two) times daily into both nostrils. Can use 3 times a day as needed Patient not taking: Reported on 12/19/2019 07/12/17   Yetta Flock, MD  ondansetron (ZOFRAN) 4 MG tablet Take 1 tablet every 8 (eight) hours as needed for nausea or vomiting. Patient not taking: Reported on 12/19/2019 12/15/19   Armbruster, Carlota Raspberry, MD    Current Facility-Administered Medications  Medication Dose Route Frequency Provider Last Rate Last Admin  . 0.9 %  sodium chloride infusion   Intravenous Continuous Damita Lack, MD 150 mL/hr at 12/21/19 0844 New Bag at 12/21/19 0844  . acetaminophen (TYLENOL) tablet 650 mg  650 mg Oral Q6H PRN Toy Baker, MD   650 mg at 12/20/19 1939   Or  . acetaminophen (TYLENOL) suppository 650 mg  650 mg Rectal Q6H PRN Doutova, Anastassia, MD      . diphenoxylate-atropine (LOMOTIL) 2.5-0.025 MG per tablet 1 tablet  1 tablet Oral QID PRN Doutova, Anastassia, MD      . insulin aspart (novoLOG) injection 0-5 Units  0-5 Units Subcutaneous QHS Amin, Ankit Chirag, MD      . insulin aspart (novoLOG) injection 0-9 Units  0-9 Units Subcutaneous TID WC Amin, Ankit Chirag, MD   1 Units at 12/20/19 1706  . methylPREDNISolone sodium succinate (SOLU-MEDROL) 40 mg/mL injection 40 mg  40 mg Intravenous Q8H Zehr, Jessica D, PA-C   40 mg at 12/21/19 0503  . ondansetron (ZOFRAN) tablet 4 mg  4 mg Oral Q6H PRN Toy Baker, MD       Or  . ondansetron (ZOFRAN) injection 4 mg  4 mg Intravenous Q6H PRN Toy Baker, MD   4 mg at 12/19/19 2121  . sodium chloride flush (NS) 0.9 % injection 3 mL  3 mL Intravenous Once Doutova, Anastassia, MD      . sodium chloride flush (NS) 0.9 % injection 3 mL  3 mL Intravenous Q12H Doutova, Anastassia, MD      . traMADol (ULTRAM) tablet 50 mg  50 mg Oral Q8H PRN Toy Baker, MD        Allergies as of 12/19/2019 - Review Complete 12/19/2019  Allergen Reaction Noted  . Methotrexate derivatives Other  (See Comments) 04/22/2017  . Phenergan [promethazine hcl] Other (See Comments) 01/18/2018  . Humira [adalimumab] Other (See Comments) 02/23/2017  . Penicillins Other (See Comments) 02/23/2017  . Remicade [infliximab] Other (See Comments) 02/23/2017    Family History  Problem Relation Age of Onset  . Heart disease Mother   . Diabetes Father   . Heart disease Father   . Heart attack Father   . Melanoma Sister   . Crohn's disease Brother   . Inflammatory bowel disease Brother   . Diabetes Paternal Grandmother   . Colon cancer Neg Hx   . Rectal cancer Neg Hx   . Stomach cancer Neg Hx   . Esophageal cancer Neg Hx     Social  History   Socioeconomic History  . Marital status: Divorced    Spouse name: Not on file  . Number of children: 2  . Years of education: Not on file  . Highest education level: Not on file  Occupational History  . Occupation: Retired  Tobacco Use  . Smoking status: Former Smoker    Packs/day: 1.00    Years: 20.00    Pack years: 20.00    Types: Cigarettes    Quit date: 08/31/2006    Years since quitting: 13.3  . Smokeless tobacco: Never Used  Substance and Sexual Activity  . Alcohol use: No  . Drug use: No  . Sexual activity: Not on file  Other Topics Concern  . Not on file  Social History Narrative  . Not on file   Social Determinants of Health   Financial Resource Strain:   . Difficulty of Paying Living Expenses:   Food Insecurity:   . Worried About Charity fundraiser in the Last Year:   . Arboriculturist in the Last Year:   Transportation Needs:   . Film/video editor (Medical):   Marland Kitchen Lack of Transportation (Non-Medical):   Physical Activity:   . Days of Exercise per Week:   . Minutes of Exercise per Session:   Stress:   . Feeling of Stress :   Social Connections:   . Frequency of Communication with Friends and Family:   . Frequency of Social Gatherings with Friends and Family:   . Attends Religious Services:   . Active Member of  Clubs or Organizations:   . Attends Archivist Meetings:   Marland Kitchen Marital Status:   Intimate Partner Violence:   . Fear of Current or Ex-Partner:   . Emotionally Abused:   Marland Kitchen Physically Abused:   . Sexually Abused:     Review of Systems: ROS is O/W negative except as mentioned in HPI.  Physical Exam: Vital signs in last 24 hours: Temp:  [97.7 F (36.5 C)-98.7 F (37.1 C)] 97.7 F (36.5 C) (04/22 2778) Pulse Rate:  [55-71] 60 (04/22 0632) Resp:  [15-20] 18 (04/22 2423) BP: (93-109)/(52-76) 97/65 (04/22 5361) SpO2:  [98 %-100 %] 99 % (04/22 4431) Last BM Date: 12/21/19 General:  Alert, Well-developed, well-nourished, pleasant and cooperative in NAD Lungs:  Clear throughout to auscultation.  No wheezes, crackles, or rhonchi.  Heart:  Regular rate and rhythm; no murmurs, clicks, rubs, or gallops. Abdomen:  Soft, non-distended.  BS present.  Non-tender.  Ostomy noted in LLQ. Extremities:  Without clubbing or edema. Neurologic:  Alert and oriented x 4;  grossly normal neurologically.  Intake/Output from previous day: 04/21 0701 - 04/22 0700 In: 3293.1 [P.O.:1100; I.V.:2000; IV Piggyback:193.1] Out: -   Lab Results: Recent Labs    12/19/19 1527 12/20/19 0642 12/21/19 0601  WBC 15.7* 7.2 28.3*  HGB 16.6 12.9* 11.0*  HCT 49.7 40.3 33.1*  PLT 325 255 235   BMET Recent Labs    12/19/19 2314 12/20/19 0642 12/21/19 0601  NA 136 136 139  K 3.1* 3.7 3.4*  CL 111 113* 119*  CO2 14* 14* 15*  GLUCOSE 131* 135* 118*  BUN 35* 34* 30*  CREATININE 2.41* 2.34* 2.54*  CALCIUM 7.6* 8.1* 8.2*   LFT Recent Labs    12/20/19 0642  PROT 5.9*  ALBUMIN 3.1*  AST 13*  ALT 12  ALKPHOS 83  BILITOT 1.1   Studies/Results: CT ABDOMEN PELVIS WO CONTRAST  Result Date: 12/19/2019 CLINICAL DATA:  Acute, generalized abdominal pain. Neutropenia. 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 History of Crohn's disease. EXAM: CT  ABDOMEN AND PELVIS WITHOUT CONTRAST TECHNIQUE: Multidetector CT imaging of the abdomen and pelvis was performed following the standard protocol without IV contrast. COMPARISON:  01/17/2018. FINDINGS: Lower chest: Pronounced bullous changes in the inferior left upper lobe. Linear scarring in the left lower lobe. Normal sized heart. Hepatobiliary: No focal liver abnormality is seen. Status post cholecystectomy. No biliary dilatation. Pancreas: Unremarkable. No pancreatic ductal dilatation or surrounding inflammatory changes. Spleen: Normal in size without focal abnormality. Adrenals/Urinary Tract: Adrenal glands are unremarkable. Kidneys are normal, without renal calculi, focal lesion, or hydronephrosis. Bladder is unremarkable. Stomach/Bowel: Unremarkable stomach and small bowel. Again demonstrated are changes of a subtotal colectomy a Hartmann's pouch. The previously demonstrated loop of ileum in the left upper and in the mid pelvis is less dilated than previously seen. There is progressive associated wall thickening, currently measuring 8 mm in maximum thickness. This is distal to an anastomosis in the left mid abdomen. Bowel at the anastomosis remains dilated without wall thickening. No inflammatory soft tissue stranding is seen. Vascular/Lymphatic: No significant vascular findings are present. No enlarged abdominal or pelvic lymph nodes. Reproductive: Prostate is unremarkable. Other: Left anterior mid pelvic ostomy. Musculoskeletal: Mild lumbar and lower thoracic spine degenerative changes. IMPRESSION: 1. Progressive wall thickening involving a loop of ileum in the left upper and mid pelvis, distal to an anastomosis in the left mid abdomen. This is compatible mildly progressive thickening associated with the patient's known Crohn's disease. A neoplastic process can not be excluded but is less likely. 2. 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 3. Pronounced bullous changes in the inferior  left upper lobe. Electronically Signed   By: Claudie Revering M.D.   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 Most recently on Stelara every 4 weeks and had been doing well for quite some time without hospitalization.  Last dose was due 4/18, but he has not taken it because he was feeling so poorly.  Appears to have Crohn's flare with progressive thickening of a loop of ileum in the left mid-abdomen, ? PSBO that resolved quickly.  Improved rapidly with high dose IV steroids and abx. *AKI:  Likely secondary to dehydration.  Improving with IV hydration, but Cr remains unchanged today. *New leukocytosis:  Likely secondary to the very high doses of IV steroids he is receiving.  PLAN: -Will have him receive 2 more doses of IV steroids for today and then I have placed order to start PO prednisone 40 mg daily starting tomorrow AM. -If renal function improves then anticipate possible discharge at some point tomorrow. -Plan to resume Stelara upon discharge.   Laban Emperor. Zehr  12/21/2019, 9:39 AM    Attending physician's note   I have taken an interval history, reviewed the chart and examined the patient. I agree with the Advanced Practitioner's note, impression and recommendations.   Doing much better today.  Tolerating p.o. No further abdominal pain. Ostomy output at baseline (3-4/day)  Plan: -Transition him to p.o. prednisone with gradual taper. -Resume Stelara at discharge. -Continue supportive treatment. -If feels better, can D/C later tomorrow.   Carmell Austria, MD Velora Heckler Fabienne Bruns [phone].

## 2019-12-22 DIAGNOSIS — E860 Dehydration: Secondary | ICD-10-CM

## 2019-12-22 LAB — BASIC METABOLIC PANEL
Anion gap: 7 (ref 5–15)
BUN: 27 mg/dL — ABNORMAL HIGH (ref 6–20)
CO2: 15 mmol/L — ABNORMAL LOW (ref 22–32)
Calcium: 7.6 mg/dL — ABNORMAL LOW (ref 8.9–10.3)
Chloride: 123 mmol/L — ABNORMAL HIGH (ref 98–111)
Creatinine, Ser: 1.75 mg/dL — ABNORMAL HIGH (ref 0.61–1.24)
GFR calc Af Amer: 49 mL/min — ABNORMAL LOW (ref >60)
GFR calc non Af Amer: 42 mL/min — ABNORMAL LOW (ref >60)
Glucose, Bld: 121 mg/dL — ABNORMAL HIGH (ref 70–99)
Potassium: 3.5 mmol/L (ref 3.5–5.1)
Sodium: 145 mmol/L (ref 135–145)

## 2019-12-22 LAB — CBC
HCT: 32.5 % — ABNORMAL LOW (ref 39.0–52.0)
Hemoglobin: 10.5 g/dL — ABNORMAL LOW (ref 13.0–17.0)
MCH: 29.9 pg (ref 26.0–34.0)
MCHC: 32.3 g/dL (ref 30.0–36.0)
MCV: 92.6 fL (ref 80.0–100.0)
Platelets: 205 10*3/uL (ref 150–400)
RBC: 3.51 MIL/uL — ABNORMAL LOW (ref 4.22–5.81)
RDW: 16 % — ABNORMAL HIGH (ref 11.5–15.5)
WBC: 17.2 10*3/uL — ABNORMAL HIGH (ref 4.0–10.5)
nRBC: 0 % (ref 0.0–0.2)

## 2019-12-22 LAB — GLUCOSE, CAPILLARY
Glucose-Capillary: 95 mg/dL (ref 70–99)
Glucose-Capillary: 96 mg/dL (ref 70–99)

## 2019-12-22 LAB — MAGNESIUM: Magnesium: 1.5 mg/dL — ABNORMAL LOW (ref 1.7–2.4)

## 2019-12-22 MED ORDER — PREDNISONE 20 MG PO TABS: 40.0000 mg | ORAL_TABLET | Freq: Every day | ORAL | 0 refills | Status: DC

## 2019-12-22 MED ORDER — PANTOPRAZOLE SODIUM 40 MG PO TBEC: 40.0000 mg | DELAYED_RELEASE_TABLET | Freq: Every day | ORAL | 0 refills | Status: DC

## 2019-12-22 NOTE — Care Management Important Message (Signed)
Important Message  Patient Details IM Letter given to Roque Lias SW Case Manager to present to the Patient Name: Gregory [name]. MRN: [id number] Date of Birth: May 28, 1961   Medicare Important Message Given:  Yes     Kerin Salen 12/22/2019, 11:23 AM

## 2019-12-22 NOTE — Progress Notes (Signed)
Patient given discharge instructions, and verbalized an understanding of all discharge instructions.  Patient agrees with discharge plan, and is being discharged in stable medical condition.  Patient given transportation via wheelchair. 

## 2019-12-22 NOTE — Discharge Summary (Signed)
. Physician Discharge Summary  Gregory [name]. HBZ:[id number] DOB: 04/26/1961 DOA: 12/19/2019  PCP: Billie Ruddy, MD  Admit date: 12/19/2019 Discharge date: 12/22/2019  Admitted From: Home Disposition:  Discharged to home.   Recommendations for Outpatient Follow-up:  1. Follow up with PCP in 1 weeks 2. Please obtain BMP/CBC in one week 3. Follow up with GI as scheduled.   Discharge Condition: Stable  CODE STATUS: FULL   Brief/Interim Summary: 59 year old with history of Crohn's disease status post colostomy, depression, GERD presents with 4 days of weakness, nausea, headache and unable to tolerate p.o.  He went to GI office yesterday was sent to ER.  Recently had COVID-19 vaccination.  GI Dr. Tarri Glenn called ER recommended Solu-Medrol 125 mg every 6 hours, Cipro and Flagyl.  CT abdomen pelvis showed thickened loop of ileum.  Also noted to be in acute kidney injury likely from dehydration  4/23: Cleared by GI for d/c. To follow up with them in 6 days. Continue prednisone 55m qday until then. They will decide on taper at the time. He is eager to go. Will discharge to home.   Discharge Diagnoses:  Active Problems:   Nausea & vomiting   Protein calorie malnutrition (HCC)   GERD (gastroesophageal reflux disease)   Hypokalemia   AK[place] ([place] kidney injury) ([place]   Crohn's disease of both small and lg int w unsp comp ([facility]   Dehydration   Exacerbation of Crohn's disease of small intestine (HCC)  Nausea vomiting, nonbloody Acute exacerbation of Crohn's disease, colostomy in place     - given steroid burst, now on prednisone 464mfor next 7 days; taper to be determined at his GI f/u in 6 days     - diet as tolerated     - continue stelara  acute kidney injury secondary to dehydration, prerenal moderate to severe dehydration Metabolic acidosis, GI losses     - Baseline creatinine 1.2, admission creatinine 3.12.     - SCr is 1.75 this AM; improving  Leukocytosis     - Suspect  secondary to steroid use.       - No obvious signs of infection at this time.  moderate protein calorie malnutrition     - dietary consult  GERD     - protonix  depression     - continue zoloft at discharge  Discharge Instructions   Allergies as of 12/22/2019      Reactions   Methotrexate Derivatives Other (See Comments)   Anemia, low WBC, severe GI symptoms,    Phenergan [promethazine Hcl] Other (See Comments)   twitching   Humira [adalimumab] Other (See Comments)   Intolerance   Penicillins Other (See Comments)   REACTION DURING SURGERY Has patient had a PCN reaction causing immediate rash, facial/tongue/throat swelling, SOB or lightheadedness with hypotension: Unknown Has patient had a PCN reaction causing severe rash involving mucus membranes or skin necrosis: Unknown Has patient had a PCN reaction that required hospitalization: Unknown Has patient had a PCN reaction occurring within the last 10 years: No If all of the above answers are "NO", then may proceed with Cephalosporin use.   Remicade [infliximab] Other (See Comments)   Intolerance      Medication List    STOP taking these medications   cholestyramine light 4 g packet Commonly known as: PREVALITE   cyanocobalamin 1000 MCG/ML injection Commonly known as: (VITAMIN B-12)   ipratropium 0.03 % nasal spray Commonly known as: Atrovent   ondansetron 4 MG  tablet Commonly known as: ZOFRAN   traMADol 50 MG tablet Commonly known as: ULTRAM     TAKE these medications   acetaminophen 500 MG tablet Commonly known as: TYLENOL Take 500 mg by mouth every 6 (six) hours as needed for moderate pain.   diphenoxylate-atropine 2.5-0.025 MG tablet Commonly known as: LOMOTIL TAKE 1 TABLET 4 TIMES A DAY AS NEEDED FOR DIARRHEA OR LOOSE STOOLS What changed: See the new instructions.   ferrous sulfate 325 (65 FE) MG tablet TAKE 1 TABLET (325 MG TOTAL) 2 (TWO) TIMES DAILY WITH A MEAL BY MOUTH.   multivitamin with  minerals Tabs tablet Take 1 tablet by mouth daily.   ondansetron 4 MG disintegrating tablet Commonly known as: ZOFRAN-ODT TAKE ONE TABLET BY MOUTH EVERY 8 HOURS AS NEEDED FOR NAUSEA AND VOMITING What changed: See the new instructions.   pantoprazole 40 MG tablet Commonly known as: PROTONIX Take 1 tablet (40 mg total) by mouth daily at 6 (six) AM. Start taking on: December 23, 2019   potassium chloride 20 MEQ packet Commonly known as: KLOR-CON Take 20 mEq by mouth daily. What changed:   when to take this  additional instructions   predniSONE 20 MG tablet Commonly known as: DELTASONE Take 2 tablets (40 mg total) by mouth daily before breakfast for 7 days. Start taking on: December 23, 2019   sertraline 50 MG tablet Commonly known as: ZOLOFT Take 1 tablet (50 mg total) by mouth daily. What changed:   when to take this  reasons to take this   ustekinumab 90 MG/ML Sosy injection Commonly known as: Stelara Inject 1 mL (90 mg total) into the skin every 8 (eight) weeks. What changed: when to take this   Vitamin D (Ergocalciferol) 50 MCG (2000 UT) Caps Take 1 capsule by mouth daily.       Allergies  Allergen Reactions  . Methotrexate Derivatives Other (See Comments)    Anemia, low WBC, severe GI symptoms,   . Phenergan [Promethazine Hcl] Other (See Comments)    twitching  . Humira [Adalimumab] Other (See Comments)    Intolerance  . Penicillins Other (See Comments)    REACTION DURING SURGERY Has patient had a PCN reaction causing immediate rash, facial/tongue/throat swelling, SOB or lightheadedness with hypotension: Unknown Has patient had a PCN reaction causing severe rash involving mucus membranes or skin necrosis: Unknown Has patient had a PCN reaction that required hospitalization: Unknown Has patient had a PCN reaction occurring within the last 10 years: No If all of the above answers are "NO", then may proceed with Cephalosporin use.   . Remicade [Infliximab]  Other (See Comments)    Intolerance    Consultations:  GI   Procedures/Studies: CT ABDOMEN PELVIS WO CONTRAST  Result Date: 12/19/2019 CLINICAL DATA:  Acute, generalized abdominal pain. Neutropenia. 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 History of Crohn's disease. EXAM: CT ABDOMEN AND PELVIS WITHOUT CONTRAST TECHNIQUE: Multidetector CT imaging of the abdomen and pelvis was performed following the standard protocol without IV contrast. COMPARISON:  01/17/2018. FINDINGS: Lower chest: Pronounced bullous changes in the inferior left upper lobe. Linear scarring in the left lower lobe. Normal sized heart. Hepatobiliary: No focal liver abnormality is seen. Status post cholecystectomy. No biliary dilatation. Pancreas: Unremarkable. No pancreatic ductal dilatation or surrounding inflammatory changes. Spleen: Normal in size without focal abnormality. Adrenals/Urinary Tract: Adrenal glands are unremarkable. Kidneys are normal, without renal calculi, focal lesion, or hydronephrosis. Bladder is  unremarkable. Stomach/Bowel: Unremarkable stomach and small bowel. Again demonstrated are changes of a subtotal colectomy a Hartmann's pouch. The previously demonstrated loop of ileum in the left upper and in the mid pelvis is less dilated than previously seen. There is progressive associated wall thickening, currently measuring 8 mm in maximum thickness. This is distal to an anastomosis in the left mid abdomen. Bowel at the anastomosis remains dilated without wall thickening. No inflammatory soft tissue stranding is seen. Vascular/Lymphatic: No significant vascular findings are present. No enlarged abdominal or pelvic lymph nodes. Reproductive: Prostate is unremarkable. Other: Left anterior mid pelvic ostomy. Musculoskeletal: Mild lumbar and lower thoracic spine degenerative changes. IMPRESSION: 1. Progressive wall thickening involving a loop of ileum in the left  upper and mid pelvis, distal to an anastomosis in the left mid abdomen. This is compatible mildly progressive thickening associated with the patient's known Crohn's disease. A neoplastic process can not be excluded but is less likely. 2. 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 3. Pronounced bullous changes in the inferior left upper lobe. Electronically Signed   By: Claudie Revering M.D.   On: 12/19/2019 17:21     Subjective: "I feel pretty good. I'm looking for my breakfast."  Discharge Exam: Vitals:   12/21/19 2108 12/22/19 0506  BP: 98/85 108/61  Pulse: (!) 57 (!) 54  Resp: 18 18  Temp: 98.2 F (36.8 C) 98 F (36.7 C)  SpO2: 98% 98%   Vitals:   12/21/19 0632 12/21/19 1343 12/21/19 2108 12/22/19 0506  BP: 97/65 99/68 98/85  108/61  Pulse: 60 61 (!) 57 (!) 54  Resp: 18 18 18 18   Temp: 97.7 F (36.5 C) 98.9 F (37.2 C) 98.2 F (36.8 C) 98 F (36.7 C)  TempSrc: Oral Oral    SpO2: 99% 100% 98% 98%  Weight:      Height:        General: 59 y.o. male resting in bed in NAD Cardiovascular: RRR, +S1, S2, no m/g/r, equal pulses throughout Respiratory: CTABL, no w/r/r, normal WOB GI: BS+, NDNT, no masses noted, no organomegaly noted MSK: No e/c/c Skin: No rashes, bruises, ulcerations noted Neuro: A&O x 3, no focal deficits Psyc: Appropriate interaction and affect, calm/cooperative     The results of significant diagnostics from this hospitalization (including imaging, microbiology, ancillary and laboratory) are listed below for reference.     Microbiology: Recent Results (from the past 240 hour(s))  Respiratory Panel by RT PCR (Flu A&B, Covid) - Nasopharyngeal Swab     Status: None   Collection Time: 12/19/19  6:22 PM   Specimen: Nasopharyngeal Swab  Result Value Ref Range Status   SARS Coronavirus 2 by RT PCR NEGATIVE NEGATIVE Final    Comment: (NOTE) SARS-CoV-2 target nucleic acids are NOT DETECTED. The SARS-CoV-2 RNA is generally detectable in  upper respiratoy specimens during the acute phase of infection. The lowest concentration of SARS-CoV-2 viral copies this assay can detect is 131 copies/mL. A negative result does not preclude SARS-Cov-2 infection and should not be used as the sole basis for treatment or other patient management decisions. A negative result may occur with  improper specimen collection/handling, submission of specimen other than nasopharyngeal swab, presence of viral mutation(s) within the areas targeted by this assay, and inadequate number of viral copies (<131 copies/mL). A negative result must be combined with clinical observations, patient history, and epidemiological information. The expected result is Negative. Fact Sheet for Patients:  PinkCheek.be Fact Sheet for Healthcare Providers:  GravelBags.it This test is not yet ap proved or cleared by the [place] and  has been authorized for detection and/or diagnosis of SARS-CoV-2 by FDA under an Emergency Use Authorization (EUA). This EUA will remain  in effect (meaning this test can be used) for the duration of the COVID-19 declaration under Section 564(b)(1) of the Act, 21 U.S.C. section 360bbb-3(b)(1), unless the authorization is terminated or revoked sooner.    Influenza A by PCR NEGATIVE NEGATIVE Final   Influenza B by PCR NEGATIVE NEGATIVE Final    Comment: (NOTE) The Xpert Xpress SARS-CoV-2/FLU/RSV assay is intended as an aid in  the diagnosis of influenza from Nasopharyngeal swab specimens and  should not be used as a sole basis for treatment. Nasal washings and  aspirates are unacceptable for Xpert Xpress SARS-CoV-2/FLU/RSV  testing. Fact Sheet for Patients: PinkCheek.be Fact Sheet for Healthcare Providers: GravelBags.it This test is not yet approved or cleared by the [place] FDA and  has been authorized for  detection and/or diagnosis of SARS-CoV-2 by  FDA under an Emergency Use Authorization (EUA). This EUA will remain  in effect (meaning this test can be used) for the duration of the  Covid-19 declaration under Section 564(b)(1) of the Act, 21  U.S.C. section 360bbb-3(b)(1), unless the authorization is  terminated or revoked. Performed at [facility], [place] [address] [place], [place] [address]      Labs: BNP (last 3 results) No results for input(s): BNP in the last 8760 hours. Basic Metabolic Panel: Recent Labs  Lab 12/19/19 1527 12/19/19 2314 12/20/19 0642 12/21/19 0601 12/22/19 0459  NA 132* 136 136 139 145  K 3.2* 3.1* 3.7 3.4* 3.5  CL 100 111 113* 119* 123*  CO2 19* 14* 14* 15* 15*  GLUCOSE 126* 131* 135* 118* 121*  BUN 41* 35* 34* 30* 27*  CREATININE 3.12* 2.41* 2.34* 2.54* 1.75*  CALCIUM 8.8* 7.6* 8.1* 8.2* 7.6*  MG 1.7  --  2.0 1.7 1.5*  PHOS 4.5  --  3.3  --   --    Liver Function Tests: Recent Labs  Lab 12/19/19 1527 12/20/19 0642  AST 21 13*  ALT 15 12  ALKPHOS 112 83  BILITOT 1.3* 1.1  PROT 8.1 5.9*  ALBUMIN 4.1 3.1*   Recent Labs  Lab 12/19/19 1527  LIPASE 58*   No results for input(s): AMMONIA in the last 168 hours. CBC: Recent Labs  Lab 12/19/19 1527 12/20/19 0642 12/21/19 0601 12/22/19 0459  WBC 15.7* 7.2 28.3* 17.2*  NEUTROABS  --  6.4  --   --   HGB 16.6 12.9* 11.0* 10.5*  HCT 49.7 40.3 33.1* 32.5*  MCV 88.4 92.2 90.9 92.6  PLT 325 255 235 205   Cardiac Enzymes: Recent Labs  Lab 12/19/19 1527  CKTOTAL 80   BNP: Invalid input(s): POCBNP CBG: Recent Labs  Lab 12/21/19 1153 12/21/19 1726 12/21/19 2105 12/22/19 0858 12/22/19 1200  GLUCAP 115* 135* 123* 95 96   D-Dimer No results for input(s): DDIMER in the last 72 hours. Hgb A1c Recent Labs    12/20/19 0624  HGBA1C 5.7*   Lipid Profile No results for input(s): CHOL, HDL, LDLCALC, TRIG, CHOLHDL, LDLDIRECT in the last 72 hours. Thyroid function  studies Recent Labs    12/20/19 0642  TSH 0.611   Anemia work up No results for input(s): VITAMINB12, FOLATE, FERRITIN, TIBC, IRON, RETICCTPCT in the last 72 hours. Urinalysis    Component Value Date/Time   COLORURINE YELLOW 12/19/2019  [place] 12/19/2019 1810   LABSPEC 1.008 12/19/2019 1810   PHURINE 6.0 12/19/2019 1810   GLUCOSEU NEGATIVE 12/19/2019 1810   GLUCOSEU NEGATIVE 10/20/2017 1418   HGBUR NEGATIVE 12/19/2019 1810   BILIRUBINUR NEGATIVE 12/19/2019 1810   KETONESUR NEGATIVE 12/19/2019 1810   PROTEINUR NEGATIVE 12/19/2019 1810   UROBILINOGEN 0.2 10/20/2017 1418   NITRITE NEGATIVE 12/19/2019 1810   LEUKOCYTESUR NEGATIVE 12/19/2019 1810   Sepsis Labs Invalid input(s): PROCALCITONIN,  WBC,  LACTICIDVEN Microbiology Recent Results (from the past 240 hour(s))  Respiratory Panel by RT PCR (Flu A&B, Covid) - Nasopharyngeal Swab     Status: None   Collection Time: 12/19/19  6:22 PM   Specimen: Nasopharyngeal Swab  Result Value Ref Range Status   SARS Coronavirus 2 by RT PCR NEGATIVE NEGATIVE Final    Comment: (NOTE) SARS-CoV-2 target nucleic acids are NOT DETECTED. The SARS-CoV-2 RNA is generally detectable in upper respiratoy specimens during the acute phase of infection. The lowest concentration of SARS-CoV-2 viral copies this assay can detect is 131 copies/mL. A negative result does not preclude SARS-Cov-2 infection and should not be used as the sole basis for treatment or other patient management decisions. A negative result may occur with  improper specimen collection/handling, submission of specimen other than nasopharyngeal swab, presence of viral mutation(s) within the areas targeted by this assay, and inadequate number of viral copies (<131 copies/mL). A negative result must be combined with clinical observations, patient history, and epidemiological information. The expected result is Negative. Fact Sheet for Patients:   PinkCheek.be Fact Sheet for Healthcare Providers:  GravelBags.it This test is not yet ap proved or cleared by the [place] FDA and  has been authorized for detection and/or diagnosis of SARS-CoV-2 by FDA under an Emergency Use Authorization (EUA). This EUA will remain  in effect (meaning this test can be used) for the duration of the COVID-19 declaration under Section 564(b)(1) of the Act, 21 U.S.C. section 360bbb-3(b)(1), unless the authorization is terminated or revoked sooner.    Influenza A by PCR NEGATIVE NEGATIVE Final   Influenza B by PCR NEGATIVE NEGATIVE Final    Comment: (NOTE) The Xpert Xpress SARS-CoV-2/FLU/RSV assay is intended as an aid in  the diagnosis of influenza from Nasopharyngeal swab specimens and  should not be used as a sole basis for treatment. Nasal washings and  aspirates are unacceptable for Xpert Xpress SARS-CoV-2/FLU/RSV  testing. Fact Sheet for Patients: PinkCheek.be Fact Sheet for Healthcare Providers: GravelBags.it This test is not yet approved or cleared by the [place] FDA and  has been authorized for detection and/or diagnosis of SARS-CoV-2 by  FDA under an Emergency Use Authorization (EUA). This EUA will remain  in effect (meaning this test can be used) for the duration of the  Covid-19 declaration under Section 564(b)(1) of the Act, 21  U.S.C. section 360bbb-3(b)(1), unless the authorization is  terminated or revoked. Performed at [facility], [place] [address] [place], [place] [address]      Time coordinating discharge: 35 minutes  SIGNED:   Jonnie Finner, DO  [facility] 12/22/2019, 12:58 PM   If 7PM-7AM, please contact night-coverage [email or web address]

## 2019-12-22 NOTE — Progress Notes (Signed)
OT Cancellation Note  Patient Details Name: Gregory [name]. MRN: [id number] DOB: 1960/10/11   Cancelled Treatment:     Attempt to see patient this morning however just received breakfast. Discussed briefly with patient is progress and that this OT observed him walking in hallway independently yesterday. Patient declines any need for [facility] OT or DME at this time. Will check back as schedule allows.  Delbert Phenix OT Pager: [place] 12/22/2019, 1:00 PM

## 2019-12-22 NOTE — Progress Notes (Addendum)
     [facility] Progress Note  CC:   Crohn's disease with flare  Subjective:  Feels great.  Up walking around the halls.  Plan is for discharge today.   Objective:  Vital signs in last 24 hours: Temp:  [98 F (36.7 C)-98.9 F (37.2 C)] 98 F (36.7 C) (04/23 0506) Pulse Rate:  [54-61] 54 (04/23 0506) Resp:  [18] 18 (04/23 0506) BP: (98-108)/(61-85) 108/61 (04/23 0506) SpO2:  [98 %-100 %] 98 % (04/23 0506) Last BM Date: 12/21/19 General:  Alert, Well-developed, in NAD Heart:  Regular rate and rhythm; no murmurs Pulm:  CTAB.  No increased WOB. Abdomen:  Soft, non-distended.  BS present.  Non-tender. Extremities:  Without edema. Neurologic:  Alert and oriented x 4;  grossly normal neurologically. Psych:  Alert and cooperative. Normal mood and affect.  Intake/Output from previous day: 04/22 0701 - 04/23 0700 In: 1900 [P.O.:960; I.V.:940] Out: -   Lab Results: Recent Labs    12/20/19 0642 12/21/19 0601 12/22/19 0459  WBC 7.2 28.3* 17.2*  HGB 12.9* 11.0* 10.5*  HCT 40.3 33.1* 32.5*  PLT 255 235 205   BMET Recent Labs    12/20/19 0642 12/21/19 0601 12/22/19 0459  NA 136 139 145  K 3.7 3.4* 3.5  CL 113* 119* 123*  CO2 14* 15* 15*  GLUCOSE 135* 118* 121*  BUN 34* 30* 27*  CREATININE 2.34* 2.54* 1.75*  CALCIUM 8.1* 8.2* 7.6*   LFT Recent Labs    12/20/19 0642  PROT 5.9*  ALBUMIN 3.1*  AST 13*  ALT 12  ALKPHOS 83  BILITOT 1.1   Assessment / Plan: *Complicated small bowelCrohn's disease / LLQ ostomy / B12 deficiency / Vitamin D deficiency / High risk medication use- h/o multiple resections, 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 Most recently on Stelaraevery 4 weeks and had been doing well for quite some time without hospitalization. Last dose was due 4/18, but he has not taken it because he was feeling so poorly. Appears to have Crohn's flare with progressive thickening of a loop of ileum in the left  mid-abdomen, ? PSBO that resolved quickly.  Improved rapidly with high dose IV steroids and abx. *AKI: Likely secondary to dehydration. Improving with IV hydration, Cr down some again today. *New leukocytosis:  Likely secondary to the very high doses of IV steroids he is receiving, better this AM.  -Please discharge him on prednisone 40 mg daily with 7 pills.  Then taper can be determined at his follow-up with Dr. Havery Moros in 6 days. -Cleared for discharge from GI standpoint. -Plan to resume Stelara upon discharge. -He actually has a OV follow-up with Dr. Havery Moros in 6 days, 4/29 so will plan to keep that appt and will come in for repeat labs prior to his visit.   LOS: 3 days   Laban Emperor. Zehr  12/22/2019, 9:00 AM   Attending physician's note   I have taken an interval history, reviewed the chart and examined the patient. I agree with the Advanced Practitioner's note, impression and recommendations.   Feels great Tolerating regular diet. Would continue prednisone 40 mg p.o. once a day until follow-up visit He already has OV scheduled. Resume Stelara. Discussed in detail with the patient.  He is pleased with the progress.  Carmell Austria, MD [facility] Heckler Fabienne Bruns [phone].

## 2019-12-25 ENCOUNTER — Telehealth: Payer: Self-pay | Admitting: *Deleted

## 2019-12-25 NOTE — Telephone Encounter (Signed)
Transition Care Management Follow-up Telephone Call   Date discharged? 12/22/2019   How have you been since you were released from the hospital? Gregory [name] been doing well as can be expected    Do you understand why you were in the hospital? yes   Do you understand the discharge instructions? no, They just gave me my papers and did  Not explain them to me    Where were you discharged to? Home    Items Reviewed:  Medications reviewed: yes  Allergies reviewed: yes  Dietary changes reviewed: yes  Referrals reviewed: N/A    Functional Questionnaire:   Activities of Daily Living (ADLs):   He states they are independent in the following: bathing and hygiene, feeding, continence, grooming, toileting and dressing States they require assistance with the following: ambulation   Any transportation issues/concerns?: no   Any patient concerns? no   Confirmed importance and date/time of follow-up visits scheduled yes  Provider Appointment booked with Dr. Volanda Napoleon 12/27/2019 at 1PM   Confirmed with patient if condition begins to worsen call PCP or go to the ER.  Patient was given the office number and encouraged to call back with question or concerns.  : yes

## 2019-12-27 ENCOUNTER — Other Ambulatory Visit: Payer: Self-pay

## 2019-12-27 ENCOUNTER — Encounter: Payer: Self-pay | Admitting: Family Medicine

## 2019-12-27 ENCOUNTER — Other Ambulatory Visit (INDEPENDENT_AMBULATORY_CARE_PROVIDER_SITE_OTHER): Payer: Medicare Other

## 2019-12-27 ENCOUNTER — Ambulatory Visit (INDEPENDENT_AMBULATORY_CARE_PROVIDER_SITE_OTHER): Payer: Medicare Other | Admitting: Family Medicine

## 2019-12-27 VITALS — BP 98/60 | HR 68 | Temp 98.0°F | Wt 134.0 lb

## 2019-12-27 DIAGNOSIS — R42 Dizziness and giddiness: Secondary | ICD-10-CM

## 2019-12-27 DIAGNOSIS — K50819 Crohn's disease of both small and large intestine with unspecified complications: Secondary | ICD-10-CM

## 2019-12-27 DIAGNOSIS — K5080 Crohn's disease of both small and large intestine without complications: Secondary | ICD-10-CM | POA: Diagnosis not present

## 2019-12-27 DIAGNOSIS — H9312 Tinnitus, left ear: Secondary | ICD-10-CM

## 2019-12-27 DIAGNOSIS — D229 Melanocytic nevi, unspecified: Secondary | ICD-10-CM

## 2019-12-27 DIAGNOSIS — Z09 Encounter for follow-up examination after completed treatment for conditions other than malignant neoplasm: Secondary | ICD-10-CM

## 2019-12-27 LAB — COMPREHENSIVE METABOLIC PANEL
ALT: 17 U/L (ref 0–53)
AST: 10 U/L (ref 0–37)
Albumin: 3.1 g/dL — ABNORMAL LOW (ref 3.5–5.2)
Alkaline Phosphatase: 67 U/L (ref 39–117)
BUN: 13 mg/dL (ref 6–23)
CO2: 18 mEq/L — ABNORMAL LOW (ref 19–32)
Calcium: 6.8 mg/dL — ABNORMAL LOW (ref 8.4–10.5)
Chloride: 117 mEq/L — ABNORMAL HIGH (ref 96–112)
Creatinine, Ser: 1.43 mg/dL (ref 0.40–1.50)
GFR: 50.67 mL/min — ABNORMAL LOW (ref >60.00)
Glucose, Bld: 100 mg/dL — ABNORMAL HIGH (ref 70–99)
Potassium: 3 mEq/L — ABNORMAL LOW (ref 3.5–5.1)
Sodium: 142 mEq/L (ref 135–145)
Total Bilirubin: 0.8 mg/dL (ref 0.2–1.2)
Total Protein: 5.3 g/dL — ABNORMAL LOW (ref 6.0–8.3)

## 2019-12-27 NOTE — Progress Notes (Signed)
Subjective:    Patient ID: Gregory [name]., male    DOB: Dec 18, 1960, 59 y.o.   MRN: [id number]  No chief complaint on file.   HPI Pt is a 59 yo male with pmh sig for Crohn's dz s/p colostomy, depression, GERD seen for HFU.  Pt admitted 4/20-4/23 for AKI 2/2 dehydration and Crohn's flare.  Symptoms started several days after receiving first dose of Covid vaccine.  Patient given Solu-Medrol 125 every 6, Cipro, and Flagyl.  CT abdomen/pelvis with thickened loop of ileum.  Pt d/c on prednisone 40 mg with plans to taper in the future.  Since being home patient states he is feeling better.  Endorses some abdominal soreness.  Trying to stay hydrated.  Has upcoming follow-up with GI.  Pt looks told buzzing in left ear x3 weeks.  Sound is louder in the mornings.  Pt also noticed when walking he veers to the left.  Using a cane 2/2 this sensation.  States feels off balance.  Pt denies fever, chills, emesis.  Pt also noticed a new mole in groin shortly after his Covid vaccine.  Lesion is nonpruritic, nonerythematous, and without drainage.  Past Medical History:  Diagnosis Date  . Acute endocarditis   . Crohn's disease ([place])   . Depression   . Eating disorder   . GERD (gastroesophageal reflux disease)   . Kidney stones   . Short gut syndrome     Allergies  Allergen Reactions  . Methotrexate Derivatives Other (See Comments)    Anemia, low WBC, severe GI symptoms,   . Phenergan [Promethazine Hcl] Other (See Comments)    twitching  . Humira [Adalimumab] Other (See Comments)    Intolerance  . Penicillins Other (See Comments)    REACTION DURING SURGERY Has patient had a PCN reaction causing immediate rash, facial/tongue/throat swelling, SOB or lightheadedness with hypotension: Unknown Has patient had a PCN reaction causing severe rash involving mucus membranes or skin necrosis: Unknown Has patient had a PCN reaction that required hospitalization: Unknown Has patient had a PCN reaction occurring  within the last 10 years: No If all of the above answers are "NO", then may proceed with Cephalosporin use.   . Remicade [Infliximab] Other (See Comments)    Intolerance    ROS General: Denies fever, chills, night sweats, changes in weight, changes in appetite + off-balance HEENT: Denies headaches, ear pain, changes in vision, rhinorrhea, sore throat  + tinnitus in left ear CV: Denies CP, palpitations, SOB, orthopnea Pulm: Denies SOB, cough, wheezing GI: Denies abdominal pain, nausea, vomiting, diarrhea, constipation GU: Denies dysuria, hematuria, frequency, vaginal discharge Msk: Denies muscle cramps, joint pains Neuro: Denies weakness, numbness, tingling Skin: Denies rashes, bruising  + skin lesion in groin Psych: Denies depression, anxiety, hallucinations     Objective:    Blood pressure 98/60, pulse 68, temperature 98 F (36.7 C), temperature source Temporal, weight 134 lb (60.8 kg), SpO2 98 %.  Gen. Pleasant, thin, in no distress, normal affect HEENT: /AT, face symmetric, conjunctiva clear, no scleral icterus, PERRLA, EOMI, brief nystagmus, nares patent without drainage, pharynx without erythema or exudate.  External ears and canals bilaterally normal.  Canals narrow.  Mild cerumen impaction noted.  Cerumen removed from left canal with curette.  TMs normal bilaterally.  Dix-Hallpike performed. Lungs: no accessory muscle use, CTAB, no wheezes or rales Cardiovascular: RRR, no m/r/g, no peripheral edema Abdomen: BS present, soft, mildly tender,ND, no hepatosplenomegaly. Neuro:  A&Ox3, CN II-XII intact, ambulating with cane.  Veers to  the left. s/p Dix-Hallpike maneuver appeared to walk straight. Skin:  Warm, no rash.  8 mm slightly raised, hyperpigmented, oval-shaped nevus on dorsum of penile shaft without erythema, irregular borders, color variation.   Wt Readings from Last 3 Encounters:  12/19/19 118 lb 3.2 oz (53.6 kg)  04/10/19 124 lb (56.2 kg)  07/05/18 120 lb (54.4 kg)     Lab Results  Component Value Date   WBC 17.2 (H) 12/22/2019   HGB 10.5 (L) 12/22/2019   HCT 32.5 (L) 12/22/2019   PLT 205 12/22/2019   GLUCOSE 121 (H) 12/22/2019   ALT 12 12/20/2019   AST 13 (L) 12/20/2019   NA 145 12/22/2019   K 3.5 12/22/2019   CL 123 (H) 12/22/2019   CREATININE 1.75 (H) 12/22/2019   BUN 27 (H) 12/22/2019   CO2 15 (L) 12/22/2019   TSH 0.611 12/20/2019   INR 1.18 05/18/2017   HGBA1C 5.7 (H) 12/20/2019    Assessment/Plan:  Vertigo -Likely 2/2 recent dehydration -Discussed other causes. -Consent obtained.  Dix-Hallpike maneuvers performed.  Pt tolerated maneuver well.  Noted improvement in symptoms. -Given handout -Hydration encouraged -For continued symptoms next week consider imaging -We will continue to monitor  Tinnitus of left ear -Discussed various causes including dehydration, viral illness, medications, pathology -Consent obtained.  Cerumen removed from left ear.  Patient tolerated procedure well and noted improvement in hearing s/p. -For continued symptoms consider referral to ENT -Given handout  Hospital discharge follow-up -TCM phone call made and reviewed -Notes and labs from hospitalization reviewed  Crohn's disease of both small and large intestine with unspecified comp ([place]) -Continue prednisone 40 mg and current medications including Stelara, Zofran, Protonix, Lomotil -Continue follow-up with GI  [place] -Reassured -Discussed referral to dermatology for removal options -Patient wishes to monitor -Pt given strict precautions for any changes or irritation noted  F/u as needed next week for continued balance issues.  Grier Mitts, MD

## 2019-12-27 NOTE — Patient Instructions (Addendum)
Dizziness Dizziness is a common problem. It is a feeling of unsteadiness or light-headedness. You may feel like you are about to faint. Dizziness can lead to injury if you stumble or fall. Anyone can become dizzy, but dizziness is more common in older adults. This condition can be caused by a number of things, including medicines, dehydration, or illness. Follow these instructions at home: Eating and drinking  Drink enough fluid to keep your urine clear or pale yellow. This helps to keep you from becoming dehydrated. Try to drink more clear fluids, such as water.  Do not drink alcohol.  Limit your caffeine intake if told to do so by your health care provider. Check ingredients and nutrition facts to see if a food or beverage contains caffeine.  Limit your salt (sodium) intake if told to do so by your health care provider. Check ingredients and nutrition facts to see if a food or beverage contains sodium. Activity  Avoid making quick movements. ? Rise slowly from chairs and steady yourself until you feel okay. ? In the morning, first sit up on the side of the bed. When you feel okay, stand slowly while you hold onto something until you know that your balance is fine.  If you need to stand in one place for a long time, move your legs often. Tighten and relax the muscles in your legs while you are standing.  Do not drive or use heavy machinery if you feel dizzy.  Avoid bending down if you feel dizzy. Place items in your home so that they are easy for you to reach without leaning over. Lifestyle  Do not use any products that contain nicotine or tobacco, such as cigarettes and e-cigarettes. If you need help quitting, ask your health care provider.  Try to reduce your stress level by using methods such as yoga or meditation. Talk with your health care provider if you need help to manage your stress. General instructions  Watch your dizziness for any changes.  Take over-the-counter and  prescription medicines only as told by your health care provider. Talk with your health care provider if you think that your dizziness is caused by a medicine that you are taking.  Tell a friend or a family member that you are feeling dizzy. If he or she notices any changes in your behavior, have this person call your health care provider.  Keep all follow-up visits as told by your health care provider. This is important. Contact a health care provider if:  Your dizziness does not go away.  Your dizziness or light-headedness gets worse.  You feel nauseous.  You have reduced hearing.  You have new symptoms.  You are unsteady on your feet or you feel like the room is spinning. Get help right away if:  You vomit or have diarrhea and are unable to eat or drink anything.  You have problems talking, walking, swallowing, or using your arms, hands, or legs.  You feel generally weak.  You are not thinking clearly or you have trouble forming sentences. It may take a friend or family member to notice this.  You have chest pain, abdominal pain, shortness of breath, or sweating.  Your vision changes.  You have any bleeding.  You have a severe headache.  You have neck pain or a stiff neck.  You have a fever. These symptoms may represent a serious problem that is an emergency. Do not wait to see if the symptoms will go away. Get medical help  right away. Call your local emergency services (911 in the [place] Do not drive yourself to the hospital. Summary  Dizziness is a feeling of unsteadiness or light-headedness. This condition can be caused by a number of things, including medicines, dehydration, or illness.  Anyone can become dizzy, but dizziness is more common in older adults.  Drink enough fluid to keep your urine clear or pale yellow. Do not drink alcohol.  Avoid making quick movements if you feel dizzy. Monitor your dizziness for any changes. This information is not intended to  replace advice given to you by your health care provider. Make sure you discuss any questions you have with your health care provider. Document Revised: 08/20/2017 Document Reviewed: 09/19/2016 Elsevier Patient Education  [place].  Tinnitus Tinnitus refers to hearing a sound when there is no actual source for that sound. This is often described as ringing in the ears. However, people with this condition may hear a variety of noises, in one ear or in both ears. The sounds of tinnitus can be soft, loud, or somewhere in between. Tinnitus can last for a few seconds or can be constant for days. It may go away without treatment and come back at various times. When tinnitus is constant or happens often, it can lead to other problems, such as trouble sleeping and trouble concentrating. Almost everyone experiences tinnitus at some point. Tinnitus that is long-lasting (chronic) or comes back often (recurs) may require medical attention. What are the causes? The cause of tinnitus is often not known. In some cases, it can result from:  Exposure to loud noises from machinery, music, or other sources.  An object (foreign body) stuck in the ear.  Earwax buildup.  Drinking alcohol or caffeine.  Taking certain medicines.  Age-related hearing loss. It may also be caused by medical conditions such as:  Ear or sinus infections.  High blood pressure.  Heart diseases.  Anemia.  Allergies.  Meniere's disease.  Thyroid problems.  Tumors.  A weak, bulging blood vessel (aneurysm) near the ear. What are the signs or symptoms? The main symptom of tinnitus is hearing a sound when there is no source for that sound. It may sound like:  Buzzing.  Roaring.  Ringing.  Blowing air.  Hissing.  Whistling.  Sizzling.  Humming.  Running water.  A musical note.  Tapping. Symptoms may affect only one ear (unilateral) or both ears (bilateral). How is this diagnosed? Tinnitus is  diagnosed based on your symptoms, your medical history, and a physical exam. Your health care provider may do a thorough hearing test (audiologic exam) if your tinnitus:  Is unilateral.  Causes hearing difficulties.  Lasts 6 months or longer. You may work with a health care provider who specializes in hearing disorders (audiologist). You may be asked questions about your symptoms and how they affect your daily life. You may have other tests done, such as:  CT scan.  MRI.  An imaging test of how blood flows through your blood vessels (angiogram). How is this treated? Treating an underlying medical condition can sometimes make tinnitus go away. If your tinnitus continues, other treatments may include:  Medicines.  Therapy and counseling to help you manage the stress of living with tinnitus.  Sound generators to mask the tinnitus. These include: ? Tabletop sound machines that play relaxing sounds to help you fall asleep. ? Wearable devices that fit in your ear and play sounds or music. ? Acoustic neural stimulation. This involves using headphones to  listen to music that contains an auditory signal. Over time, listening to this signal may change some pathways in your brain and make you less sensitive to tinnitus. This treatment is used for very severe cases when no other treatment is working.  Using hearing aids or cochlear implants if your tinnitus is related to hearing loss. Hearing aids are worn in the outer ear. Cochlear implants are surgically placed in the inner ear. Follow these instructions at home: Managing symptoms      When possible, avoid being in loud places and being exposed to loud sounds.  Wear hearing protection, such as earplugs, when you are exposed to loud noises.  Use a white noise machine, a humidifier, or other devices to mask the sound of tinnitus.  Practice techniques for reducing stress, such as meditation, yoga, or deep breathing. Work with your health  care provider if you need help with managing stress.  Sleep with your head slightly raised. This may reduce the impact of tinnitus. General instructions  Do not use stimulants, such as nicotine, alcohol, or caffeine. Talk with your health care provider about other stimulants to avoid. Stimulants are substances that can make you feel alert and attentive by increasing certain activities in the body (such as heart rate and blood pressure). These substances may make tinnitus worse.  Take over-the-counter and prescription medicines only as told by your health care provider.  Try to get plenty of sleep each night.  Keep all follow-up visits as told by your health care provider. This is important. Contact a health care provider if:  Your tinnitus continues for 3 weeks or longer without stopping.  You develop sudden hearing loss.  Your symptoms get worse or do not get better with home care.  You feel you are not able to manage the stress of living with tinnitus. Get help right away if:  You develop tinnitus after a head injury.  You have tinnitus along with any of the following: ? Dizziness. ? Loss of balance. ? Nausea and vomiting. ? Sudden, severe headache. These symptoms may represent a serious problem that is an emergency. Do not wait to see if the symptoms will go away. Get medical help right away. Call your local emergency services (911 in the [place] Do not drive yourself to the hospital. Summary  Tinnitus refers to hearing a sound when there is no actual source for that sound. This is often described as ringing in the ears.  Symptoms may affect only one ear (unilateral) or both ears (bilateral).  Use a white noise machine, a humidifier, or other devices to mask the sound of tinnitus.  Do not use stimulants, such as nicotine, alcohol, or caffeine. Talk with your health care provider about other stimulants to avoid. These substances may make tinnitus worse. This information is  not intended to replace advice given to you by your health care provider. Make sure you discuss any questions you have with your health care provider. Document Revised: 03/01/2019 Document Reviewed: 05/27/2017 Elsevier Patient Education  [place]. How to Perform the Epley Maneuver The Epley maneuver is an exercise that relieves symptoms of vertigo. Vertigo is the feeling that you or your surroundings are moving when they are not. When you feel vertigo, you may feel like the room is spinning and have trouble walking. Dizziness is a little different than vertigo. When you are dizzy, you may feel unsteady or light-headed. You can do this maneuver at home whenever you have symptoms of vertigo. You  can do it up to 3 times a day until your symptoms go away. Even though the Epley maneuver may relieve your vertigo for a few weeks, it is possible that your symptoms will return. This maneuver relieves vertigo, but it does not relieve dizziness. What are the risks? If it is done correctly, the Epley maneuver is considered safe. Sometimes it can lead to dizziness or nausea that goes away after a short time. If you develop other symptoms, such as changes in vision, weakness, or numbness, stop doing the maneuver and call your health care provider. How to perform the Epley maneuver 1. Sit on the edge of a bed or table with your back straight and your legs extended or hanging over the edge of the bed or table. 2. Turn your head halfway toward the affected ear or side. 3. Lie backward quickly with your head turned until you are lying flat on your back. You may want to position a pillow under your shoulders. 4. Hold this position for 30 seconds. You may experience an attack of vertigo. This is normal. 5. Turn your head to the opposite direction until your unaffected ear is facing the floor. 6. Hold this position for 30 seconds. You may experience an attack of vertigo. This is normal. Hold this position until  the vertigo stops. 7. Turn your whole body to the same side as your head. Hold for another 30 seconds. 8. Sit back up. You can repeat this exercise up to 3 times a day. Follow these instructions at home:  After doing the Epley maneuver, you can return to your normal activities.  Ask your health care provider if there is anything you should do at home to prevent vertigo. He or she may recommend that you: ? Keep your head raised (elevated) with two or more pillows while you sleep. ? Do not sleep on the side of your affected ear. ? Get up slowly from bed. ? Avoid sudden movements during the day. ? Avoid extreme head movement, like looking up or bending over. Contact a health care provider if:  Your vertigo gets worse.  You have other symptoms, including: ? Nausea. ? Vomiting. ? Headache. Get help right away if:  You have vision changes.  You have a severe or worsening headache or neck pain.  You cannot stop vomiting.  You have new numbness or weakness in any part of your body. Summary  Vertigo is the feeling that you or your surroundings are moving when they are not.  The Epley maneuver is an exercise that relieves symptoms of vertigo.  If the Epley maneuver is done correctly, it is considered safe. You can do it up to 3 times a day. This information is not intended to replace advice given to you by your health care provider. Make sure you discuss any questions you have with your health care provider. Document Revised: 07/30/2017 Document Reviewed: 07/07/2016 Elsevier Patient Education  [place].  Mole A mole is a colored (pigmented) growth on the skin. Moles are very common. They are usually harmless, but some moles can become cancerous over time. What are the causes? Moles are caused when pigmented skin cells grow together in clusters instead of spreading out in the skin as they normally do. The reason why the skin cells grow together in clusters is not known.  What increases the risk? You are more likely to develop a mole if you:  Have family members who have moles.  Are white.  Have  blond hair.  Are often outdoors and exposed to the sun.  Received phototherapy when you were a newborn baby.  Are male. What are the signs or symptoms? A mole may be:  [place] or black.  Flat or raised.  Smooth or wrinkled. How is this diagnosed? A mole is diagnosed with a skin exam. If your health care provider thinks a mole may be cancerous, all or part of the mole will be removed for testing (biopsy). How is this treated? Most moles are noncancerous (benign) and do not require treatment. If a mole is found to be cancerous, it will be removed. You may also choose to have a mole removed if it is causing pain or if you do not like the way it looks. Follow these instructions at home: General instructions   Every month, look for new moles and check your existing moles for changes. This is important because a change in a mole can mean that the mole has become cancerous.  ABCDE changes in a mole indicate that you should be evaluated by your health care provider. ABCDE stands for: ? Asymmetry. This means the mole has an irregular shape. It is not round or oval. ? Border. This means the mole has an irregular or bumpy border. ? Color. This means the mole has multiple colors in it, including brown, black, blue, red, or tan. Note that it is normal for moles to get darker when a woman is pregnant or takes birth control pills. ? Diameter. This means the mole is more than 0.2 inches (6 mm) across. ? Evolving. This refers to any unusual changes or symptoms in the mole, such as pain, itching, stinging, sensitivity, or bleeding.  If you have a large number of moles, see a skin doctor (dermatologist) at least one time every year for a full-body skin check. Lifestyle   When you are outdoors, wear sunscreen with SPF 30 (sun protection factor 30) or higher.  Use an  adequate amount of sunscreen to cover exposed areas of skin. Put it on 30 minutes before you go out. Reapply it every 2 hours or anytime you come out of the water.  When you are out in the sun, wear a broad-brimmed hat and clothing that covers your arms and legs. Wear wraparound sunglasses. Contact a health care provider if:  The size, shape, borders, or color of your mole changes.  Your mole, or the skin near the mole, becomes painful, sore, red, or swollen.  Your mole: ? Develops more than one color. ? Itches or bleeds. ? Becomes scaly, sheds skin, or oozes fluid. ? Becomes flat or develops raised areas. ? Becomes hard or soft.  You develop a new mole. Summary  A mole is a colored (pigmented) growth on the skin. Moles are very common. They are usually harmless, but some moles can become cancerous over time.  Every month, look for new moles and check your existing moles for changes. This is important because a change in a mole can mean that the mole has become cancerous.  If you have a large number of moles, see a skin doctor (dermatologist) at least one time every year for a full-body skin check.  When you are outdoors, wear sunscreen with SPF 30 (sun protection factor 30) or higher. Reapply it every 2 hours or anytime you come out of the water.  Contact a health care provider if you notice changes in a mole or if you develop a new mole. This information is not  intended to replace advice given to you by your health care provider. Make sure you discuss any questions you have with your health care provider. Document Revised: 03/23/2019 Document Reviewed: 01/11/2018 Elsevier Patient Education  [place].

## 2019-12-28 ENCOUNTER — Ambulatory Visit (INDEPENDENT_AMBULATORY_CARE_PROVIDER_SITE_OTHER): Payer: Medicare Other | Admitting: Gastroenterology

## 2019-12-28 ENCOUNTER — Encounter: Payer: Self-pay | Admitting: Gastroenterology

## 2019-12-28 VITALS — BP 100/64 | HR 74 | Temp 98.2°F | Ht 71.0 in | Wt 134.0 lb

## 2019-12-28 DIAGNOSIS — Z79899 Other long term (current) drug therapy: Secondary | ICD-10-CM | POA: Diagnosis not present

## 2019-12-28 DIAGNOSIS — K5080 Crohn's disease of both small and large intestine without complications: Secondary | ICD-10-CM | POA: Diagnosis not present

## 2019-12-28 DIAGNOSIS — Z933 Colostomy status: Secondary | ICD-10-CM

## 2019-12-28 LAB — CBC WITH DIFFERENTIAL/PLATELET
Basophils Absolute: 0.1 10*3/uL (ref 0.0–0.1)
Basophils Relative: 0.4 % (ref 0.0–3.0)
Eosinophils Absolute: 0.1 10*3/uL (ref 0.0–0.7)
Eosinophils Relative: 0.8 % (ref 0.0–5.0)
HCT: 36.8 % — ABNORMAL LOW (ref 39.0–52.0)
Hemoglobin: 12 g/dL — ABNORMAL LOW (ref 13.0–17.0)
Lymphocytes Relative: 12.1 % (ref 12.0–46.0)
Lymphs Abs: 2.3 10*3/uL (ref 0.7–4.0)
MCHC: 32.5 g/dL (ref 30.0–36.0)
MCV: 90.9 fl (ref 78.0–100.0)
Monocytes Absolute: 1.9 10*3/uL — ABNORMAL HIGH (ref 0.1–1.0)
Monocytes Relative: 9.9 % (ref 3.0–12.0)
Neutro Abs: 14.4 10*3/uL — ABNORMAL HIGH (ref 1.4–7.7)
Neutrophils Relative %: 76.8 % (ref 43.0–77.0)
Platelets: 252 10*3/uL (ref 150.0–400.0)
RBC: 4.05 Mil/uL — ABNORMAL LOW (ref 4.22–5.81)
RDW: 16.6 % — ABNORMAL HIGH (ref 11.5–15.5)
WBC: 18.7 10*3/uL (ref 4.0–10.5)

## 2019-12-28 MED ORDER — PREDNISONE 10 MG PO TABS: ORAL_TABLET | ORAL | 0 refills | Status: AC

## 2019-12-28 MED ORDER — TRAMADOL HCL 50 MG PO TABS: 50.0000 mg | ORAL_TABLET | Freq: Three times a day (TID) | ORAL | 1 refills | Status: DC | PRN

## 2019-12-28 NOTE — Progress Notes (Signed)
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 He has acolostomywith a remnant rectal pouch. He reports he has had a bowel surgery in the 1982, with a "few more surgeries" in years after while in [place], as well as in [facility], they tried reversing hiscolostomywhich did not workwell for him, too much diarrhea and it was then reversed back. He has a history of abscess associated with small bowel.   He has been hospitalized multiple times in recent years, He hadbeen on chronic prednisone for 10 years with fluctuating levels. HewasCimzia for about 4-5 years in the past. He has been on sulfasalazine for a long time. He reports an allergy to 6MP - he does not know the details ofthe reaction. He had an allergic reaction - hives, to both Remicade and Humira.Allergy to methotrexate. Failed therapeutic Weyman Rodney, now on Stelara since March 2019.  SINCE LAST VISIT  59 year old male here for follow-up visit for his Crohn's disease.  He has an extensive history as outlined above and has failed or been allergic to numerous regimens over time.  Most recently on Stelara since March 2019 with dose escalation this past winter due to subtherapeutic dosing and concern for active disease with fecal calprotectin at 300s.  Generally he states he had been doing really well on the Stelara and feels that the higher dose had been providing benefit.  He had his first Covid dose on April 6.  He states 48 hours after the vaccine he felt poorly and gradually got worse over the past few weeks.  He states he felt extremely fatigued, headaches, abdominal pain, nausea, poor p.o. intake and became dehydrated which culminated to an emergency room visit and ultimately admission last week.  He had a CT scan done upon presentation which suggested some active Crohn's at the surgical anastomosis.  He was given IV  steroids and fluids and states he quickly felt better.  He was transitioned to prednisone 40 mg a day as an outpatient last week.  He states he is having a very hard time sleeping on steroids and wants to come off a soon as possible.  He is feeling much better.  He is subjectively convinced that the Covid vaccine made him feel poorly and triggered his flare.  He is due for another dose next week and is extremely anxious about proceeding with this given these events.  He otherwise is due for his next dose of Stelara today and asks if she should take it.  He does not smoke at all.  He states his abdomen is feeling better.  Pain is back to normal baseline.  His output from his ostomy is at baseline.  He feels more fatigued than usual.  He wants to resume his Stelara.  Of note he had labs today and was noted to have potassium of 3.0.  He is supposed to be taking potassium on a daily basis and has not been taking it over the past week or so, he got confused about his medications.  He has had the shingles vaccine in the PPSV23 since her last visit.  He was noted to have an elevated white blood cell count of 18 today without left shift.  He states his white blood cell count always goes up when he is on steroids.  Last week was in the 20s when hospitalized.  He denies any fevers and overall states he feels much better  than he did previously.  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 Normal colon, however prep was poor in certain portion of the colon and was inadequate for screening purposes to  rule out polyps, etc. The ostomy was normal..  IBD Health Care Maintenance: Annual Flu Vaccine - 2020 UTD Pneumococcal Vaccine if receiving immunosuppression: - PCV 13 01/10/18, PPSV23 04/10/19 Zoster vaccine if over age 70: 06/28/19 TB testing if on anti-TNF, yearly - Date 4/20 negative Vitamin D screening - Date 4/20 - deficient Last Colonoscopy - 07/2018   CT 12/19/19 - IMPRESSION: 1. Progressive wall thickening involving a loop of ileum in the left upper and mid pelvis, distal to an anastomosis in the left mid abdomen. This is compatible mildly progressive thickening associated with the patient's known Crohn's disease. A neoplastic process can not be excluded but is less likely. 2. 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 3. Pronounced bullous changes in the inferior left upper lobe.    Past Medical History:  Diagnosis Date  . Acute endocarditis   . Crohn's disease ([place])   . Depression   . Eating disorder   . GERD (gastroesophageal reflux disease)   . Kidney stones   . Short gut syndrome      Past Surgical History:  Procedure Laterality Date  . COLONOSCOPY WITH PROPOFOL N/A 09/17/2017   Procedure: COLONOSCOPY WITH PROPOFOL;  Surgeon: [name], MD;  Location: [facility];  Service: Endoscopy;  Laterality: N/A;  Through ileostomy.  [name] WITH STENT PLACEMENT Left 05/20/2017   Procedure: CYSTOSCOPY WITH STENT PLACEMENT;  Surgeon: [name], MD;  Location: [facility];  Service: Urology;  Laterality: Left;  . ILEOSTOMY    . IR NEPHROSTOMY EXCHANGE LEFT  04/12/2017  . IR NEPHROSTOMY PLACEMENT LEFT  04/12/2017  . IR NEPHROSTOMY PLACEMENT LEFT  04/29/2017  . NEPHROLITHOTOMY Left 05/14/2017   Procedure: LEFT NEPHROLITHOTOMY PERCUTANEOUS WITH SURGEON ACCESS;  Surgeon: [name], MD;  Location: [facility];  Service: Urology;  Laterality: Left;  . SMALL INTESTINE SURGERY    . SUBTOTAL COLECTOMY     Family History  Problem  Relation Age of Onset  . Heart disease Mother   . Diabetes Father   . Heart disease Father   . Heart attack Father   . Melanoma Sister   . Crohn's disease Brother   . Inflammatory bowel disease Brother   . Diabetes Paternal Grandmother   . Colon cancer Neg Hx   . Rectal cancer Neg Hx   . Stomach cancer Neg Hx   . Esophageal cancer Neg Hx    Social History   Tobacco Use  . Smoking status: Former Smoker    Packs/day: 1.00    Years: 20.00    Pack years: 20.00    Types: Cigarettes    Quit date: 08/31/2006    Years since quitting: 13.3  . Smokeless tobacco: Never Used  Substance Use Topics  . Alcohol use: No  . Drug use: No   Current Outpatient Medications  Medication Sig Dispense Refill  . acetaminophen (TYLENOL) 500 MG tablet Take 500 mg by mouth every 6 (six) hours  as needed for moderate pain.    . diphenoxylate-atropine (LOMOTIL) 2.5-0.025 MG tablet TAKE 1 TABLET 4 TIMES A DAY AS NEEDED FOR DIARRHEA OR LOOSE STOOLS (Patient taking differently: Take 1 tablet by mouth 4 (four) times daily as needed for diarrhea or loose stools. ) 90 tablet 0  . ferrous sulfate 325 (65 FE) MG tablet TAKE 1 TABLET (325 MG TOTAL) 2 (TWO) TIMES DAILY WITH A MEAL BY MOUTH. 120 tablet 1  . Multiple Vitamin (MULTIVITAMIN WITH MINERALS) TABS tablet Take 1 tablet by mouth daily. 30 tablet 3  . ondansetron (ZOFRAN-ODT) 4 MG disintegrating tablet TAKE ONE TABLET BY MOUTH EVERY 8 HOURS AS NEEDED FOR NAUSEA AND VOMITING (Patient taking differently: Take 4 mg by mouth every 8 (eight) hours as needed for nausea or vomiting. ) 30 tablet 2  . pantoprazole (PROTONIX) 40 MG tablet Take 1 tablet (40 mg total) by mouth daily at 6 (six) AM. 30 tablet 0  . potassium chloride (KLOR-CON) 20 MEQ packet Take 20 mEq by mouth daily. (Patient taking differently: Take 20 mEq by mouth See admin instructions. Patient is taking one on Monday and one on Thursday) 90 tablet 1  . predniSONE (DELTASONE) 20 MG tablet Take 2 tablets (40 mg  total) by mouth daily before breakfast for 7 days. 14 tablet 0  . sertraline (ZOLOFT) 50 MG tablet Take 1 tablet (50 mg total) by mouth daily. (Patient taking differently: Take 50 mg by mouth daily as needed (down spells?). ) 90 tablet 1  . ustekinumab (STELARA) 90 MG/ML SOSY injection Inject 1 mL (90 mg total) into the skin every 8 (eight) weeks. (Patient taking differently: Inject 90 mg into the skin every 30 (thirty) days. ) 1 Syringe 6  . Vitamin D, Ergocalciferol, 2000 units CAPS Take 1 capsule by mouth daily.     Current Facility-Administered Medications  Medication Dose Route Frequency Provider Last Rate Last Admin  . cyanocobalamin ((VITAMIN B-12)) injection 1,000 mcg  1,000 mcg Intramuscular Q30 days Yetta Flock, MD   1,000 mcg at 07/10/19 1411   Allergies  Allergen Reactions  . Methotrexate Derivatives Other (See Comments)    Anemia, low WBC, severe GI symptoms,   . Phenergan [Promethazine Hcl] Other (See Comments)    twitching  . Humira [Adalimumab] Other (See Comments)    Intolerance  . Penicillins Other (See Comments)    REACTION DURING SURGERY Has patient had a PCN reaction causing immediate rash, facial/tongue/throat swelling, SOB or lightheadedness with hypotension: Unknown Has patient had a PCN reaction causing severe rash involving mucus membranes or skin necrosis: Unknown Has patient had a PCN reaction that required hospitalization: Unknown Has patient had a PCN reaction occurring within the last 10 years: No If all of the above answers are "NO", then may proceed with Cephalosporin use.   . Remicade [Infliximab] Other (See Comments)    Intolerance     Review of Systems: All systems reviewed and negative except where noted in HPI.    CT ABDOMEN PELVIS WO CONTRAST  Result Date: 12/19/2019 CLINICAL DATA:  Acute, generalized abdominal pain. Neutropenia. 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  History of Crohn's disease. EXAM: CT ABDOMEN AND PELVIS WITHOUT CONTRAST TECHNIQUE: Multidetector CT imaging of the abdomen and pelvis was performed following the standard protocol without IV contrast. COMPARISON:  01/17/2018. FINDINGS: Lower chest: Pronounced bullous changes in the inferior left upper lobe. Linear scarring in the left lower lobe. Normal  sized heart. Hepatobiliary: No focal liver abnormality is seen. Status post cholecystectomy. No biliary dilatation. Pancreas: Unremarkable. No pancreatic ductal dilatation or surrounding inflammatory changes. Spleen: Normal in size without focal abnormality. Adrenals/Urinary Tract: Adrenal glands are unremarkable. Kidneys are normal, without renal calculi, focal lesion, or hydronephrosis. Bladder is unremarkable. Stomach/Bowel: Unremarkable stomach and small bowel. Again demonstrated are changes of a subtotal colectomy a Hartmann's pouch. The previously demonstrated loop of ileum in the left upper and in the mid pelvis is less dilated than previously seen. There is progressive associated wall thickening, currently measuring 8 mm in maximum thickness. This is distal to an anastomosis in the left mid abdomen. Bowel at the anastomosis remains dilated without wall thickening. No inflammatory soft tissue stranding is seen. Vascular/Lymphatic: No significant vascular findings are present. No enlarged abdominal or pelvic lymph nodes. Reproductive: Prostate is unremarkable. Other: Left anterior mid pelvic ostomy. Musculoskeletal: Mild lumbar and lower thoracic spine degenerative changes. IMPRESSION: 1. Progressive wall thickening involving a loop of ileum in the left upper and mid pelvis, distal to an anastomosis in the left mid abdomen. This is compatible mildly progressive thickening associated with the patient's known Crohn's disease. A neoplastic process can not be excluded but is less likely. 2. 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 3.  Pronounced bullous changes in the inferior left upper lobe. Electronically Signed   By: Claudie Revering M.D.   On: 12/19/2019 17:21   Lab Results  Component Value Date   WBC 18.7 Repeated and verified X2. (HH) 12/27/2019   HGB 12.0 (L) 12/27/2019   HCT 36.8 (L) 12/27/2019   MCV 90.9 12/27/2019   PLT 252.0 12/27/2019   Lab Results  Component Value Date   CREATININE 1.43 12/27/2019   BUN 13 12/27/2019   NA 142 12/27/2019   K 3.0 (L) 12/27/2019   CL 117 (H) 12/27/2019   CO2 18 (L) 12/27/2019    Lab Results  Component Value Date   ALT 17 12/27/2019   AST 10 12/27/2019   ALKPHOS 67 12/27/2019   BILITOT 0.8 12/27/2019     Physical Exam: BP 100/64   Pulse 74   Temp 98.2 F (36.8 C)   Ht 5' 11"  (1.803 m)   Wt 134 lb (60.8 kg)   SpO2 99%   BMI 18.69 kg/m  Constitutional: Pleasant, male in no acute distress. HEENT: Normocephalic and atraumatic. Conjunctivae are normal. No scleral icterus. Neck supple.  Cardiovascular: Normal rate, regular rhythm.  Pulmonary/chest: Effort normal and breath sounds normal. No wheezing, rales or rhonchi. Abdominal: Soft, nondistended, nontender. Colostomy in LLQ There are no masses palpable. Extremities: no edema Lymphadenopathy: No cervical adenopathy noted. Neurological: Alert and oriented to person place and time. Skin: Skin is warm and dry. No rashes noted. Psychiatric: Normal mood and affect. Behavior is normal.   ASSESSMENT AND PLAN: 59 year old male here for reassessment of the following issues:  Crohn's disease / colostomy / high risk medication use - very complicated Crohn's disease with history of multiple resections, component of short gut, failure of multiple regimens as outlined above.  He has been on Stelara monotherapy since last year and he clinically endorses significant improvement on the regimen since he has been on it.  Unfortunately he had a flare that led to hospitalization this past month which is new for him.  He  subjectively feels this was caused by the Covid vaccine although I think that may be less likely.  We discussed options.  He is  doing better now that he is at the hospital.  He really wants to come off the prednisone as its causing problems with insomnia and he does not like being on steroids.  His white blood cell count is elevated to 18 today although he has no fevers and I suspect this is likely due to his prednisone.  We will decrease him to 30 mg/day for 1 week and then taper by 5 mg a week until done.  He will take his Stelara today as scheduled.  After lengthy discussion he is going to decline his second Covid shot or at least delay it for now.  He understands that if he gets Covid he could be quite ill given his immunosuppressed state.  He will need to take his potassium supplement given hypokalemia, I am going to repeat his blood work next week to ensure stable.  We will keep an eye on him closely over the next several weeks.  I like to see him back in 3 months and will consider a surveillance colonoscopy at some point this year when he can handle it.  Refill tramadol  Long-term, if he ultimately fails Stelara, he does not have good other options for biologics given failure/allergic reactions to regimens as outlined above which will be a very difficult situation.  He strongly wishes to continue Stelara at this time, will await his course and see how he does.  Plan: - taper prednisone as outlined - resume Stelara today - hold off on next dose Covid vaccine per patient preference - counseled him on importance of compliance with potassium supplementation, he will take 40 mill equivalents today and then resume 20 mEq daily - CBC and BMET next week - continue vitamin supplementations - vaccinations otherwise up-to-date - follow-up in 3 months - consideration for surveillance colonoscopy at next visit, will discuss with him  [place]ith[place] Cellar, MD [facility]

## 2019-12-28 NOTE — Patient Instructions (Addendum)
If you are age 59 or older, your body mass index should be between 23-30. Your Body mass index is 18.69 kg/m. If this is out of the aforementioned range listed, please consider follow up with your Primary Care Provider.  If you are age 50 or younger, your body mass index should be between 19-25. Your Body mass index is 18.69 kg/m. If this is out of the aformentioned range listed, please consider follow up with your Primary Care Provider.   Resume Stelara today.  Hold off on second Covid vaccine.  Continue Potassium daily.  We have sent the following medications to your pharmacy for you to pick up at your convenience: Prednisone: Take 30m daily for 7 days, then decrease by 560mevery week until gone Tramadol 50 mg: Take 1 every 8 hours as needed  Please go to the lab NEXT week for CBC and BMET.   We have scheduled you for a follow up visit on Tuesday, 03-26-20 at 1:20pm   Thank you for entrusting me with your care and [facility]

## 2019-12-29 ENCOUNTER — Encounter: Payer: Self-pay | Admitting: Family Medicine

## 2020-01-02 DIAGNOSIS — Z23 Encounter for immunization: Secondary | ICD-10-CM | POA: Diagnosis not present

## 2020-02-07 ENCOUNTER — Other Ambulatory Visit: Payer: Self-pay

## 2020-02-07 MED ORDER — TRAMADOL HCL 50 MG PO TABS: 50.0000 mg | ORAL_TABLET | Freq: Three times a day (TID) | ORAL | 1 refills | Status: DC | PRN

## 2020-03-07 ENCOUNTER — Other Ambulatory Visit: Payer: Self-pay

## 2020-03-07 MED ORDER — ONDANSETRON 4 MG PO TBDP: 4.0000 mg | ORAL_TABLET | Freq: Three times a day (TID) | ORAL | 1 refills | Status: DC | PRN

## 2020-03-07 MED ORDER — POTASSIUM CHLORIDE 20 MEQ PO PACK: 20.0000 meq | PACK | Freq: Every day | ORAL | 1 refills | Status: DC

## 2020-03-07 MED ORDER — DIPHENOXYLATE-ATROPINE 2.5-0.025 MG PO TABS: ORAL_TABLET | ORAL | 1 refills | Status: DC

## 2020-03-07 MED ORDER — TRAMADOL HCL 50 MG PO TABS: 50.0000 mg | ORAL_TABLET | Freq: Three times a day (TID) | ORAL | 0 refills | Status: DC | PRN

## 2020-03-07 NOTE — Progress Notes (Signed)
Refills sent to pharmacy. 

## 2020-03-22 ENCOUNTER — Telehealth: Payer: Self-pay | Admitting: Gastroenterology

## 2020-03-22 NOTE — Telephone Encounter (Signed)
Hey Dr Havery Moros this pt cancelled his appt with you for 7/27 and rescheduled to 9/28.   Pt's daughter is going to be giving birth and pt is travelling to [place]

## 2020-03-25 NOTE — Telephone Encounter (Signed)
Okay thanks. If he has any issues in the interim he will need to contact me.

## 2020-03-26 ENCOUNTER — Ambulatory Visit: Payer: Medicare Other | Admitting: Gastroenterology

## 2020-05-08 ENCOUNTER — Other Ambulatory Visit: Payer: Self-pay

## 2020-05-08 MED ORDER — TRAMADOL HCL 50 MG PO TABS: 50.0000 mg | ORAL_TABLET | Freq: Three times a day (TID) | ORAL | 0 refills | Status: DC | PRN

## 2020-05-08 NOTE — Telephone Encounter (Signed)
Dr. Havery Moros,   Please advise on patient's request for Tramadol refill. Thank you

## 2020-05-14 ENCOUNTER — Other Ambulatory Visit: Payer: Self-pay

## 2020-05-14 MED ORDER — TRAMADOL HCL 50 MG PO TABS: 50.0000 mg | ORAL_TABLET | Freq: Three times a day (TID) | ORAL | 0 refills | Status: DC | PRN

## 2020-05-21 ENCOUNTER — Other Ambulatory Visit: Payer: Self-pay | Admitting: Gastroenterology

## 2020-05-28 ENCOUNTER — Encounter: Payer: Self-pay | Admitting: Gastroenterology

## 2020-05-28 ENCOUNTER — Other Ambulatory Visit (INDEPENDENT_AMBULATORY_CARE_PROVIDER_SITE_OTHER): Payer: Medicare Other

## 2020-05-28 ENCOUNTER — Ambulatory Visit (INDEPENDENT_AMBULATORY_CARE_PROVIDER_SITE_OTHER): Payer: Medicare Other | Admitting: Gastroenterology

## 2020-05-28 VITALS — BP 106/80 | HR 82 | Ht 71.0 in | Wt 128.1 lb

## 2020-05-28 DIAGNOSIS — K50019 Crohn's disease of small intestine with unspecified complications: Secondary | ICD-10-CM

## 2020-05-28 DIAGNOSIS — E559 Vitamin D deficiency, unspecified: Secondary | ICD-10-CM | POA: Diagnosis not present

## 2020-05-28 DIAGNOSIS — Z79899 Other long term (current) drug therapy: Secondary | ICD-10-CM

## 2020-05-28 DIAGNOSIS — K219 Gastro-esophageal reflux disease without esophagitis: Secondary | ICD-10-CM

## 2020-05-28 DIAGNOSIS — Z933 Colostomy status: Secondary | ICD-10-CM | POA: Diagnosis not present

## 2020-05-28 DIAGNOSIS — Z23 Encounter for immunization: Secondary | ICD-10-CM | POA: Diagnosis not present

## 2020-05-28 LAB — BASIC METABOLIC PANEL
BUN: 13 mg/dL (ref 6–23)
CO2: 24 mEq/L (ref 19–32)
Calcium: 8.9 mg/dL (ref 8.4–10.5)
Chloride: 109 mEq/L (ref 96–112)
Creatinine, Ser: 1.55 mg/dL — ABNORMAL HIGH (ref 0.40–1.50)
GFR: 46.11 mL/min — ABNORMAL LOW (ref >60.00)
Glucose, Bld: 96 mg/dL (ref 70–99)
Potassium: 4.3 mEq/L (ref 3.5–5.1)
Sodium: 138 mEq/L (ref 135–145)

## 2020-05-28 LAB — VITAMIN D 25 HYDROXY (VIT D DEFICIENCY, FRACTURES): VITD: 27.28 ng/mL — ABNORMAL LOW (ref 30.00–100.00)

## 2020-05-28 MED ORDER — FAMOTIDINE 20 MG PO TABS
20.0000 mg | ORAL_TABLET | Freq: Two times a day (BID) | ORAL | 1 refills | Status: DC
Start: 2020-05-28 — End: 2020-11-20

## 2020-05-28 MED ORDER — SUTAB 1479-225-188 MG PO TABS: 1.0000 | ORAL_TABLET | Freq: Once | ORAL | 0 refills | Status: AC

## 2020-05-28 NOTE — Progress Notes (Signed)
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 He has acolostomywith a remnant rectal pouch. He reports he has had a bowel surgery in the 1982, with a "few more surgeries" in years after while in [place], as well as in [facility], they tried reversing hiscolostomywhich did not workwell for him, too much diarrhea and it was then reversed back. He has a history of abscess associated with small bowel.   He has been hospitalized multiple times. He previously hadbeen on chronic prednisone for 10 years with fluctuating levels. Hewason Cimzia for about 4-5 years in the past. He has been on sulfasalazine for a long time. He reports an allergy to 6MP - he does not know the details ofthe reaction. He had an allergic reaction - hives, to both Remicade and Humira.Allergy to methotrexate. Failed therapeutic Entyvio, now on Stelara since March 2019, with dosing increase to once monthly.   SINCE LAST VISIT  58 year old male here for follow-up visit for his Crohn's disease.  History as outlined above.  He has continued on Stelara once monthly since I have last seen him.  Recall that he was admitted in April with a flare of symptoms, he was not sure if it was related to the Covid vaccine.  He was given IV steroids with quick improvement and was discharged with prednisone taper.  Generally since the last time of seeing him he states he is doing really well.  He feels that Delsa Grana has helped him more than any other drug he has had to date.  He did not have any abdominal pain.  He has fluctuating bowel symptoms, occasionally has diarrhea, sometimes does not.  No problems with the ostomy.  He had a second Covid vaccine and states he tolerated it well.  No blood in his stools.  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  He uses tramadol rarely as needed.   He mostly takes Tylenol throughout the day as tramadol can make him sleepy.  He denies NSAIDs.  He has been trying to eat a very healthy diet, eating mostly chicken and fish and states diet has helped his symptoms as well.  He feels like he is doing fairly well at this point, he has been able to gain some weight, now up to 128 pounds.  He did stop oral iron supplementation as he states it made him have a burning sensation in his colostomy.  This resolved with stopping the iron.  He does not smoke any cigarettes.  His last colonoscopy was in 2019.  He was previously given a course of Protonix for his reflux symptoms when he was hospitalized.  He states that ran out and he has had some recurrence of mild intermittent reflux symptoms.  He is inquiring about what he can take for that.  He did have an EGD in 2010 which showed no evidence of Barrett's.  He states Pepcid has worked for him in the past, he is not really taking anything at this point.  No dysphagia.  Prior evaluation: EGD 03/21/2009 - normal Colonoscopy 08/19/2010 - colo-colonic anastomosis at 60cm, active colitis and nonobstructive stricture at 70cm, ileocolonic stricture at 75cm Colonoscopy 08/2017 - normal colon, active ileitis  Colonoscopy 07/05/2018 - Diffuse inflammation, graded as Rutgeerts Score i3 (diffuse aphthous ileitis) and characterized by erythema and aphthous ulcerations was found in the terminal ileum and at the  surgical anastomosis (located in the right colon).Normal colon, however prep was poor in certain portion of the colon and was inadequate for screening purposes to rule out polyps, etc. The ostomy was normal..  IBD Health Care Maintenance: Annual Flu Vaccine- 2020 UTD Pneumococcal Vaccineif receiving immunosuppression: - PCV 13 01/10/18, PPSV23 04/10/19 Zoster vaccineif over age 77: 06/28/19 TB testingif on anti-TNF, yearly - Date 4/20 negative Vitamin Dscreening - Date 4/20 - deficient Last Colonoscopy  -07/2018 COVID 19 vaccine UTD  CT 12/19/19 - IMPRESSION: 1. Progressive wall thickening involving a loop of ileum in the left upper and mid pelvis, distal to an anastomosis in the left mid abdomen. This is compatible mildly progressive thickening associated with the patient's known Crohn's disease. A neoplastic process can not be excluded but is less likely. 2. 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 3. Pronounced bullous changes in the inferior left upper lobe.   Past Medical History:  Diagnosis Date  . Acute endocarditis   . Crohn's disease ([place])   . Depression   . Eating disorder   . GERD (gastroesophageal reflux disease)   . Kidney stones   . Short gut syndrome      Past Surgical History:  Procedure Laterality Date  . COLONOSCOPY WITH PROPOFOL N/A 09/17/2017   Procedure: COLONOSCOPY WITH PROPOFOL;  Surgeon: [name], MD;  Location: [facility];  Service: Endoscopy;  Laterality: N/A;  Through ileostomy.  [name] WITH STENT PLACEMENT Left 05/20/2017   Procedure: CYSTOSCOPY WITH STENT PLACEMENT;  Surgeon: [name], MD;  Location: [facility];  Service: Urology;  Laterality: Left;  . ILEOSTOMY    . IR NEPHROSTOMY EXCHANGE LEFT  04/12/2017  . IR NEPHROSTOMY PLACEMENT LEFT  04/12/2017  . IR NEPHROSTOMY PLACEMENT LEFT  04/29/2017  . NEPHROLITHOTOMY Left 05/14/2017   Procedure: LEFT NEPHROLITHOTOMY PERCUTANEOUS WITH SURGEON ACCESS;  Surgeon: [name], MD;  Location: [facility];  Service: Urology;  Laterality: Left;  . SMALL INTESTINE SURGERY    . SUBTOTAL COLECTOMY     Family History  Problem Relation Age of Onset  . Heart disease Mother   . Diabetes Father   . Heart disease Father   . Heart attack Father   . Melanoma Sister   . Crohn's disease Brother   . Inflammatory bowel disease Brother   . Diabetes Paternal Grandmother   . Colon cancer Neg Hx   . Rectal cancer Neg Hx   . Stomach cancer Neg Hx   . Esophageal cancer  Neg Hx    Social History   Tobacco Use  . Smoking status: Former Smoker    Packs/day: 1.00    Years: 20.00    Pack years: 20.00    Types: Cigarettes    Quit date: 08/31/2006    Years since quitting: 13.7  . Smokeless tobacco: Never Used  Vaping Use  . Vaping Use: Never used  Substance Use Topics  . Alcohol use: No  . Drug use: No   Current Outpatient Medications  Medication Sig Dispense Refill  . acetaminophen (TYLENOL) 500 MG tablet Take 500 mg by mouth every 6 (six) hours as needed for moderate pain.    . diphenoxylate-atropine (LOMOTIL) 2.5-0.025 MG tablet TAKE 1 TABLET 4 TIMES A DAY AS NEEDED FOR DIARRHEA OR LOOSE STOOLS 360 tablet 1  . Multiple Vitamin (MULTIVITAMIN WITH MINERALS) TABS tablet Take 1 tablet by mouth daily. 30 tablet 3  . ondansetron (ZOFRAN) 4 MG tablet TAKE 1 TABLET EVERY 8  HOURS AS NEEDED FOR NASUEA OR VOMITING 30 tablet 0  . potassium chloride (KLOR-CON) 20 MEQ packet Take 20 mEq by mouth daily. 90 each 1  . sertraline (ZOLOFT) 50 MG tablet Take 1 tablet (50 mg total) by mouth daily. (Patient taking differently: Take 50 mg by mouth daily as needed (down spells?). ) 90 tablet 1  . traMADol (ULTRAM) 50 MG tablet Take 1 tablet (50 mg total) by mouth every 8 (eight) hours as needed. 60 tablet 0  . ustekinumab (STELARA) 90 MG/ML SOSY injection Inject 1 mL (90 mg total) into the skin every 8 (eight) weeks. (Patient taking differently: Inject 90 mg into the skin every 30 (thirty) days. ) 1 Syringe 6  . Vitamin D, Ergocalciferol, 2000 units CAPS Take 1 capsule by mouth daily.    . famotidine (PEPCID) 20 MG tablet Take 1 tablet (20 mg total) by mouth 2 (two) times daily. 90 tablet 1  . Sodium Sulfate-Mag Sulfate-KCl (SUTAB) [phone] MG TABS Take 1 kit by mouth once for 1 dose. MANUFACTURER CODES!! BIN: [id number] PCN: CN GROUP: [id number] MEMBER ID: [id number];VHQ AS CASH;NO PRIOR AUTHORIZATION 24 tablet 0   Current Facility-Administered Medications  Medication Dose  Route Frequency Provider Last Rate Last Admin  . cyanocobalamin ((VITAMIN B-12)) injection 1,000 mcg  1,000 mcg Intramuscular Q30 days Yetta Flock, MD   1,000 mcg at 07/10/19 1411   Allergies  Allergen Reactions  . Methotrexate Derivatives Other (See Comments)    Anemia, low WBC, severe GI symptoms,   . Phenergan [Promethazine Hcl] Other (See Comments)    twitching  . Humira [Adalimumab] Other (See Comments)    Intolerance  . Penicillins Other (See Comments)    REACTION DURING SURGERY Has patient had a PCN reaction causing immediate rash, facial/tongue/throat swelling, SOB or lightheadedness with hypotension: Unknown Has patient had a PCN reaction causing severe rash involving mucus membranes or skin necrosis: Unknown Has patient had a PCN reaction that required hospitalization: Unknown Has patient had a PCN reaction occurring within the last 10 years: No If all of the above answers are "NO", then may proceed with Cephalosporin use.   . Remicade [Infliximab] Other (See Comments)    Intolerance     Review of Systems: All systems reviewed and negative except where noted in HPI.    Lab Results  Component Value Date   WBC 18.7 Repeated and verified X2. (HH) 12/27/2019   HGB 12.0 (L) 12/27/2019   HCT 36.8 (L) 12/27/2019   MCV 90.9 12/27/2019   PLT 252.0 12/27/2019    Lab Results  Component Value Date   CREATININE 1.43 12/27/2019   BUN 13 12/27/2019   NA 142 12/27/2019   K 3.0 (L) 12/27/2019   CL 117 (H) 12/27/2019   CO2 18 (L) 12/27/2019    Lab Results  Component Value Date   ALT 17 12/27/2019   AST 10 12/27/2019   ALKPHOS 67 12/27/2019   BILITOT 0.8 12/27/2019    Physical Exam: BP 106/80   Pulse 82   Ht 5' 11" (1.803 m)   Wt 128 lb 2 oz (58.1 kg)   BMI 17.87 kg/m  Constitutional: Pleasant,well-developed, male in no acute distress. Cardiovascular: Normal rate, regular rhythm.  Pulmonary/chest: Effort normal and breath sounds normal. No wheezing,  rales or rhonchi. Abdominal: Soft, nondistended, nontender. Midline abdominal scar. Colostomy in LLQ C/d/i. There are no masses palpable.  Extremities: no edema Lymphadenopathy: No cervical adenopathy noted. Neurological: Alert and oriented to person place and time. Skin:  Skin is warm and dry. No rashes noted. Psychiatric: Normal mood and affect. Behavior is normal.   ASSESSMENT AND PLAN: 58 year old male here for reassessment of the following issues:  Crohn's disease / colostomy / high risk medication use - complicated Crohn's disease with history of multiple resections, component of short gut, failure of multiple regimens as outlined above.  On Stelara monotherapy since 2019, generally he has subjectively felt much better on this regimen.  He unfortunately was hospitalized earlier in the spring however he feels this was more due to COVID-19 reaction.  He has been on high-dose Stelara, has not required additional steroids.  Generally feeling better and gaining weight.  Discussed options moving forward.  I think a surveillance colonoscopy would be useful on high-dose Stelara to see how things are going compared to the last exam.  I discussed risk and benefits of colonoscopy and anesthesia and he wanted to proceed.  He is due for basic labs today, will send for CBC, c-Met, vitamin D, QuantiFERON gold.  He is due for his yearly flu shot, he wanted to proceed with that today.  He will continue to avoid NSAIDs, continue regimen for now, await colonoscopy and his labs.  He agreed.    GERD - mild intermittent symptoms, no dysphagia.  Discussed options.  Protonix worked for him however he has had a good response to Pepcid in the past.  We will place him on Pepcid 20 mg a day, given favorable safety profile.  If this does not work he needs escalation to PPI we can do that.  Prior EGD showed no evidence of Barrett's.  He agreed  Plan: - continue Stelara - schedule for surveillance colonoscopy - labs  today - CBC, CMET, vitamin D, quantiferon gold - flu shot today - start Pepcid 20 mg / day  Verona Cellar, MD [facility]

## 2020-05-28 NOTE — Patient Instructions (Addendum)
If you are age 59 or older, your body mass index should be between 23-30. Your Body mass index is 17.87 kg/m. If this is out of the aforementioned range listed, please consider follow up with your Primary Care Provider.  If you are age 53 or younger, your body mass index should be between 19-25. Your Body mass index is 17.87 kg/m. If this is out of the aformentioned range listed, please consider follow up with your Primary Care Provider.   You have been scheduled for a colonoscopy. Please follow written instructions given to you at your visit today.  Please pick up your prep supplies at the pharmacy within the next 1-3 days. If you use inhalers (even only as needed), please bring them with you on the day of your procedure.   We have sent the following medications to your pharmacy for you to pick up at your convenience: Pepcid 20 mg once daily.  Your provider has requested that you go to the basement level for lab work before leaving today. Press "B" on the elevator. The lab is located at the first door on the left as you exit the elevator.  We have given you a flu shot today.  Thank you for entrusting me with your care and for choosing [facility], Dr. Viera West Cellar

## 2020-05-31 LAB — QUANTIFERON-TB GOLD PLUS
Mitogen-NIL: >10 IU/mL
NIL: 0.07 IU/mL
QuantiFERON-TB Gold Plus: NEGATIVE
TB1-NIL: 0.02 IU/mL
TB2-NIL: 0.01 IU/mL

## 2020-06-04 ENCOUNTER — Telehealth: Payer: Self-pay

## 2020-06-04 DIAGNOSIS — K50019 Crohn's disease of small intestine with unspecified complications: Secondary | ICD-10-CM

## 2020-06-04 DIAGNOSIS — Z79899 Other long term (current) drug therapy: Secondary | ICD-10-CM

## 2020-06-04 NOTE — Telephone Encounter (Signed)
Received a call from the lab.  They experienced a problem  with the sample for pt's recent CBC draw.  Lab needs pt to return to lab to redo CBC ordered on 05-28-20. Called and informed pt. Requested that he go back to the lab one day this week or next.

## 2020-06-11 ENCOUNTER — Encounter: Payer: Self-pay | Admitting: Gastroenterology

## 2020-06-11 ENCOUNTER — Other Ambulatory Visit: Payer: Self-pay

## 2020-06-11 ENCOUNTER — Ambulatory Visit (AMBULATORY_SURGERY_CENTER): Payer: Medicare Other | Admitting: Gastroenterology

## 2020-06-11 VITALS — BP 100/65 | HR 60 | Temp 97.5°F | Resp 11 | Ht 71.0 in | Wt 128.0 lb

## 2020-06-11 DIAGNOSIS — Z933 Colostomy status: Secondary | ICD-10-CM | POA: Diagnosis not present

## 2020-06-11 DIAGNOSIS — K5730 Diverticulosis of large intestine without perforation or abscess without bleeding: Secondary | ICD-10-CM | POA: Diagnosis not present

## 2020-06-11 DIAGNOSIS — K5090 Crohn's disease, unspecified, without complications: Secondary | ICD-10-CM | POA: Diagnosis not present

## 2020-06-11 DIAGNOSIS — K50019 Crohn's disease of small intestine with unspecified complications: Secondary | ICD-10-CM | POA: Diagnosis not present

## 2020-06-11 HISTORY — PX: COLONOSCOPY: SHX174

## 2020-06-11 MED ORDER — SODIUM CHLORIDE 0.9 % IV SOLN: 500.0000 mL | Freq: Once | INTRAVENOUS | Status: DC

## 2020-06-11 NOTE — Patient Instructions (Signed)
Handout on diverticulosis   YOU HAD AN ENDOSCOPIC PROCEDURE TODAY AT [facility]:   Refer to the procedure report that was given to you for any specific questions about what was found during the examination.  If the procedure report does not answer your questions, please call your gastroenterologist to clarify.  If you requested that your care partner not be given the details of your procedure findings, then the procedure report has been included in a sealed envelope for you to review at your convenience later.  YOU SHOULD EXPECT: Some feelings of bloating in the abdomen. Passage of more gas than usual.  Walking can help get rid of the air that was put into your GI tract during the procedure and reduce the bloating. If you had a lower endoscopy (such as a colonoscopy or flexible sigmoidoscopy) you may notice spotting of blood in your stool or on the toilet paper. If you underwent a bowel prep for your procedure, you may not have a normal bowel movement for a few days.  Please Note:  You might notice some irritation and congestion in your nose or some drainage.  This is from the oxygen used during your procedure.  There is no need for concern and it should clear up in a day or so.  SYMPTOMS TO REPORT IMMEDIATELY:   Following lower endoscopy (colonoscopy or flexible sigmoidoscopy):  Excessive amounts of blood in the stool  Significant tenderness or worsening of abdominal pains  Swelling of the abdomen that is new, acute  Fever of 100F or higher   For urgent or emergent issues, a gastroenterologist can be reached at any hour by calling [phone]. Do not use MyChart messaging for urgent concerns.    DIET:  We do recommend a small meal at first, but then you may proceed to your regular diet.  Drink plenty of fluids but you should avoid alcoholic beverages for 24 hours.  ACTIVITY:  You should plan to take it easy for the rest of today and you should NOT DRIVE or use heavy  machinery until tomorrow (because of the sedation medicines used during the test).    FOLLOW UP: Our staff will call the number listed on your records 48-72 hours following your procedure to check on you and address any questions or concerns that you may have regarding the information given to you following your procedure. If we do not reach you, we will leave a message.  We will attempt to reach you two times.  During this call, we will ask if you have developed any symptoms of COVID 19. If you develop any symptoms (ie: fever, flu-like symptoms, shortness of breath, cough etc.) before then, please call [phone].  If you test positive for Covid 19 in the 2 weeks post procedure, please call and report this information to [place].    If any biopsies were taken you will be contacted by phone or by letter within the next 1-3 weeks.  Please call us at [phone] if you have not heard about the biopsies in 3 weeks.    SIGNATURES/CONFIDENTIALITY: You and/or your care partner have signed paperwork which will be entered into your electronic medical record.  These signatures attest to the fact that that the information above on your After Visit Summary has been reviewed and is understood.  Full responsibility of the confidentiality of this discharge information lies with you and/or your care-partner.

## 2020-06-11 NOTE — Progress Notes (Signed)
Pt's states no medical or surgical changes since previsit or office visit.  Vitals Gabi  Pt in pain after taking the sutabs last night and felt that they were "stuck" in his strictures in his colon, states colostomy is draining clear yellow watery liquid now, pain is a 7-8, let Dr Havery Moros know, no orders at this time, pt also has right shoulder pain, warm pack given.

## 2020-06-11 NOTE — Op Note (Signed)
[place] Patient Name: Aydon Swamy Procedure Date: 06/11/2020 10:50 AM MRN: [id number] Endoscopist: Remo Lipps P. Havery Moros , MD Age: 59 Referring MD:  Date of Birth: [birth year] Gender: Male Account #: [id number] Procedure:                Colonoscopy Indications:              Follow-up of Crohn's disease of the small bowel -                            allergy to anti-TNFs / thiopurines / methotrexate,                            failed Entyvio, on Stelara once monthly and                            clinically doing much better, here for surveillance                            exam Medicines:                Monitored Anesthesia Care Procedure:                Pre-Anesthesia Assessment:                           - Prior to the procedure, a History and Physical                            was performed, and patient medications and                            allergies were reviewed. The patient's tolerance of                            previous anesthesia was also reviewed. The risks                            and benefits of the procedure and the sedation                            options and risks were discussed with the patient.                            All questions were answered, and informed consent                            was obtained. Prior Anticoagulants: The patient has                            taken no previous anticoagulant or antiplatelet                            agents. ASA Grade Assessment: III - A patient with  severe systemic disease. After reviewing the risks                            and benefits, the patient was deemed in                            satisfactory condition to undergo the procedure.                           After obtaining informed consent, the colonoscope                            was passed under direct vision. Throughout the                            procedure, the patient's blood pressure, pulse, and                             oxygen saturations were monitored continuously. The                            Colonoscope was introduced through the sigmoid                            colostomy and advanced to the the terminal ileum.                            The colonoscopy was performed without difficulty.                            The patient tolerated the procedure well. The                            quality of the bowel preparation was adequate. The                            terminal ileum was photographed. Scope In: 10:59:04 AM Scope Out: 11:09:09 AM Scope Withdrawal Time: 0 hours 8 minutes 40 seconds  Total Procedure Duration: 0 hours 10 minutes 5 seconds  Findings:                 Localized inflammation, graded as Rutgeerts Score                            i2 and characterized by shallow ulcerations was                            found at the surgical anastomosis. In the distal                            10cm of ileum there were scattered small                            ulcerations with normal intervening mucosa. The  small bowel was deeply intubated and mucosa                            proximal to the distal 10cm of ileum was entirely                            normal.                           Scattered medium-mouthed diverticula were found in                            the entire colon.                           The exam was otherwise without abnormality. No                            colonic inflammation. No polyps. Complications:            No immediate complications. Estimated blood loss:                            None. Estimated Blood Loss:     Estimated blood loss: none. Impression:               - Crohn's disease with some evidence of mild                            ileitis at the anastomosis and distal 10cm of                            ileum, with normal more proximal small bowel -                            overall improved compared to  the last colonoscopy.                           - Diverticulosis in the entire examined colon.                           - The examination was otherwise normal.                           - No colonic inflammation. Recommendation:           - Patient has a contact number available for                            emergencies. The signs and symptoms of potential                            delayed complications were discussed with the                            patient. Return to normal activities tomorrow.  Written discharge instructions were provided to the                            patient.                           - Resume previous diet.                           - Continue present medications. I would continue                            Stelara for now given failure / intolerance of all                            other options historically as above, as we do not                            have other good medical options right now. Will be                            very difficult for this patient to achieve complete                            remission but Stelara has generally prevented                            disease progression and mostly kept him out of the                            hospital                           - Timing of repeat colonoscopy surveillance based                            on clinical course with Crohn's Sakoya Win P. Kyal Arts, MD 06/11/2020 11:22:40 AM This report has been signed electronically.

## 2020-06-11 NOTE — Progress Notes (Signed)
Report given to PACU, vss 

## 2020-06-13 ENCOUNTER — Telehealth: Payer: Self-pay

## 2020-06-13 NOTE — Telephone Encounter (Signed)
  Follow up Call-  Call back number 06/11/2020 07/05/2018  Post procedure Call Back phone  # [phone]  Permission to leave phone message Yes Yes  Some recent data might be hidden     Patient questions:  Do you have a fever, pain , or abdominal swelling? No. Pain Score  0 *  Have you tolerated food without any problems? Yes.    Have you been able to return to your normal activities? Yes.    Do you have any questions about your discharge instructions: Diet   No. Medications  No. Follow up visit  No.  Do you have questions or concerns about your Care? No.  Actions: * If pain score is 4 or above: No action needed, pain <4.  1. Have you developed a fever since your procedure? no  2.   Have you had an respiratory symptoms (SOB or cough) since your procedure? no  3.   Have you tested positive for COVID 19 since your procedure no  4.   Have you had any family members/close contacts diagnosed with the COVID 19 since your procedure?  no   If yes to any of these questions please route to Joylene [name], RN and Joella Prince, RN

## 2020-06-26 DIAGNOSIS — Z23 Encounter for immunization: Secondary | ICD-10-CM | POA: Diagnosis not present

## 2020-07-16 ENCOUNTER — Telehealth: Payer: Self-pay | Admitting: Gastroenterology

## 2020-08-07 ENCOUNTER — Telehealth: Payer: Self-pay | Admitting: Family Medicine

## 2020-08-07 NOTE — Telephone Encounter (Signed)
Left message for patient to call back and schedule Medicare Annual Wellness Visit (AWV) either virtually or in office.   Last AWV  please schedule at anytime with [facility] Nurse Health Advisor 1 or 2   This should be a 45 minute visit.

## 2020-08-13 ENCOUNTER — Ambulatory Visit (INDEPENDENT_AMBULATORY_CARE_PROVIDER_SITE_OTHER): Payer: Medicare Other

## 2020-08-13 DIAGNOSIS — Z0000 Encounter for general adult medical examination without abnormal findings: Secondary | ICD-10-CM | POA: Diagnosis not present

## 2020-08-13 NOTE — Patient Instructions (Addendum)
Gregory [name] , Thank you for taking time to come for your Medicare Wellness Visit. I appreciate your ongoing commitment to your health goals. Please review the following plan we discussed and let me know if I can assist you in the future.   Screening recommendations/referrals: Colonoscopy: Done 06/11/20 Recommended yearly ophthalmology/optometry visit for glaucoma screening and checkup Recommended yearly dental visit for hygiene and checkup  Vaccinations: Influenza vaccine: Done 05/28/20 Pneumococcal vaccine: Up to date Tdap vaccine: Up to date Shingles vaccine: 1st dose 06/28/19   Covid-19: Completed 4/6, 5/4, & 06/26/20  Advanced directives: Please bring a copy of your health care power of attorney and living will to the office at your convenience.  Conditions/risks identified: Eat healthier foods that I can tolerate Next appointment: Follow up in one year for your annual wellness visit   Preventive Care 40-64 Years, Male Preventive care refers to lifestyle choices and visits with your health care provider that can promote health and wellness. What does preventive care include?  A yearly physical exam. This is also called an annual well check.  Dental exams once or twice a year.  Routine eye exams. Ask your health care provider how often you should have your eyes checked.  Personal lifestyle choices, including:  Daily care of your teeth and gums.  Regular physical activity.  Eating a healthy diet.  Avoiding tobacco and drug use.  Limiting alcohol use.  Practicing safe sex.  Taking low-dose aspirin every day starting at age 1. What happens during an annual well check? The services and screenings done by your health care provider during your annual well check will depend on your age, overall health, lifestyle risk factors, and family history of disease. Counseling  Your health care provider may ask you questions about your:  Alcohol use.  Tobacco use.  Drug  use.  Emotional well-being.  Home and relationship well-being.  Sexual activity.  Eating habits.  Work and work Statistician. Screening  You may have the following tests or measurements:  Height, weight, and BMI.  Blood pressure.  Lipid and cholesterol levels. These may be checked every 5 years, or more frequently if you are over 35 years old.  Skin check.  Lung cancer screening. You may have this screening every year starting at age 58 if you have a 30-pack-year history of smoking and currently smoke or have quit within the past 15 years.  Fecal occult blood test (FOBT) of the stool. You may have this test every year starting at age 13.  Flexible sigmoidoscopy or colonoscopy. You may have a sigmoidoscopy every 5 years or a colonoscopy every 10 years starting at age 25.  Prostate cancer screening. Recommendations will vary depending on your family history and other risks.  Hepatitis C blood test.  Hepatitis B blood test.  Sexually transmitted disease (STD) testing.  Diabetes screening. This is done by checking your blood sugar (glucose) after you have not eaten for a while (fasting). You may have this done every 1-3 years. Discuss your test results, treatment options, and if necessary, the need for more tests with your health care provider. Vaccines  Your health care provider may recommend certain vaccines, such as:  Influenza vaccine. This is recommended every year.  Tetanus, diphtheria, and acellular pertussis (Tdap, Td) vaccine. You may need a Td booster every 10 years.  Zoster vaccine. You may need this after age 15.  Pneumococcal 13-valent conjugate (PCV13) vaccine. You may need this if you have certain conditions and have not been  vaccinated.  Pneumococcal polysaccharide (PPSV23) vaccine. You may need one or two doses if you smoke cigarettes or if you have certain conditions. Talk to your health care provider about which screenings and vaccines you need and how  often you need them. This information is not intended to replace advice given to you by your health care provider. Make sure you discuss any questions you have with your health care provider. Document Released: 09/13/2015 Document Revised: 05/06/2016 Document Reviewed: 06/18/2015 Elsevier Interactive Patient Education  2017 [place] Prevention in the Home Falls can cause injuries. They can happen to people of all ages. There are many things you can do to make your home safe and to help prevent falls. What can I do on the outside of my home?  Regularly fix the edges of walkways and driveways and fix any cracks.  Remove anything that might make you trip as you walk through a door, such as a raised step or threshold.  Trim any bushes or trees on the path to your home.  Use bright outdoor lighting.  Clear any walking paths of anything that might make someone trip, such as rocks or tools.  Regularly check to see if handrails are loose or broken. Make sure that both sides of any steps have handrails.  Any raised decks and porches should have guardrails on the edges.  Have any leaves, snow, or ice cleared regularly.  Use sand or salt on walking paths during winter.  Clean up any spills in your garage right away. This includes oil or grease spills. What can I do in the bathroom?  Use night lights.  Install grab bars by the toilet and in the tub and shower. Do not use towel bars as grab bars.  Use non-skid mats or decals in the tub or shower.  If you need to sit down in the shower, use a plastic, non-slip stool.  Keep the floor dry. Clean up any water that spills on the floor as soon as it happens.  Remove soap buildup in the tub or shower regularly.  Attach bath mats securely with double-sided non-slip rug tape.  Do not have throw rugs and other things on the floor that can make you trip. What can I do in the bedroom?  Use night lights.  Make sure that you have a  light by your bed that is easy to reach.  Do not use any sheets or blankets that are too big for your bed. They should not hang down onto the floor.  Have a firm chair that has side arms. You can use this for support while you get dressed.  Do not have throw rugs and other things on the floor that can make you trip. What can I do in the kitchen?  Clean up any spills right away.  Avoid walking on wet floors.  Keep items that you use a lot in easy-to-reach places.  If you need to reach something above you, use a strong step stool that has a grab bar.  Keep electrical cords out of the way.  Do not use floor polish or wax that makes floors slippery. If you must use wax, use non-skid floor wax.  Do not have throw rugs and other things on the floor that can make you trip. What can I do with my stairs?  Do not leave any items on the stairs.  Make sure that there are handrails on both sides of the stairs and use them. Fix  handrails that are broken or loose. Make sure that handrails are as long as the stairways.  Check any carpeting to make sure that it is firmly attached to the stairs. Fix any carpet that is loose or worn.  Avoid having throw rugs at the top or bottom of the stairs. If you do have throw rugs, attach them to the floor with carpet tape.  Make sure that you have a light switch at the top of the stairs and the bottom of the stairs. If you do not have them, ask someone to add them for you. What else can I do to help prevent falls?  Wear shoes that:  Do not have high heels.  Have rubber bottoms.  Are comfortable and fit you well.  Are closed at the toe. Do not wear sandals.  If you use a stepladder:  Make sure that it is fully opened. Do not climb a closed stepladder.  Make sure that both sides of the stepladder are locked into place.  Ask someone to hold it for you, if possible.  Clearly mark and make sure that you can see:  Any grab bars or  handrails.  First and last steps.  Where the edge of each step is.  Use tools that help you move around (mobility aids) if they are needed. These include:  Canes.  Walkers.  Scooters.  Crutches.  Turn on the lights when you go into a dark area. Replace any light bulbs as soon as they burn out.  Set up your furniture so you have a clear path. Avoid moving your furniture around.  If any of your floors are uneven, fix them.  If there are any pets around you, be aware of where they are.  Review your medicines with your doctor. Some medicines can make you feel dizzy. This can increase your chance of falling. Ask your doctor what other things that you can do to help prevent falls. This information is not intended to replace advice given to you by your health care provider. Make sure you discuss any questions you have with your health care provider. Document Released: 06/13/2009 Document Revised: 01/23/2016 Document Reviewed: 09/21/2014 Elsevier Interactive Patient Education  2017 [employer].

## 2020-08-13 NOTE — Progress Notes (Signed)
Subjective:   Gregory [name]. is a 59 y.o. male who presents for an Initial Medicare Annual Wellness Visit.  Review of Systems     Cardiac Risk Factors include: advanced age (>45mn, >>62women);sedentary lifestyle     Objective:    Today's Vitals   08/13/20 1139  PainSc: 7    There is no height or weight on file to calculate BMI.  Advanced Directives 08/13/2020 12/19/2019 12/19/2019 01/17/2018 09/15/2017 09/14/2017 05/20/2017  Does Patient Have a Medical Advance Directive? Yes Yes Yes Yes No No Yes  Type of Advance Directive Living will [place] will [place] will [place] will - - Living will  Does patient want to make changes to medical advance directive? - No - Patient declined - No - Patient declined - - -  Copy of [place] Chart? - No - copy requested - No - copy requested - - -  Would patient like information on creating a medical advance directive? - - - - No - Patient declined - No - Patient declined    Current Medications (verified) Outpatient Encounter Medications as of 08/13/2020  Medication Sig  . acetaminophen (TYLENOL) 500 MG tablet Take 500 mg by mouth every 6 (six) hours as needed for moderate pain.  . diphenoxylate-atropine (LOMOTIL) 2.5-0.025 MG tablet TAKE 1 TABLET 4 TIMES A DAY AS NEEDED FOR DIARRHEA OR LOOSE STOOLS  . famotidine (PEPCID) 20 MG tablet Take 1 tablet (20 mg total) by mouth 2 (two) times daily.  . Multiple Vitamin (MULTIVITAMIN WITH MINERALS) TABS tablet Take 1 tablet by mouth daily.  . ondansetron (ZOFRAN) 4 MG tablet TAKE 1 TABLET EVERY 8 HOURS AS NEEDED FOR NASUEA OR VOMITING  . potassium chloride (KLOR-CON) 20 MEQ packet Take 20 mEq by mouth daily.  . sertraline (ZOLOFT) 50 MG tablet Take 1 tablet (50 mg total) by mouth daily. (Patient taking differently: Take 50 mg by mouth daily as needed (down spells?).)  . traMADol (ULTRAM) 50 MG tablet  Take 1 tablet (50 mg total) by mouth every 8 (eight) hours as needed.  . ustekinumab (STELARA) 90 MG/ML SOSY injection Inject 1 mL (90 mg total) into the skin every 8 (eight) weeks. (Patient taking differently: Inject 90 mg into the skin every 30 (thirty) days.)  . Vitamin D, Ergocalciferol, 2000 units CAPS Take 1 capsule by mouth daily.   Facility-Administered Encounter Medications as of 08/13/2020  Medication  . cyanocobalamin ((VITAMIN B-12)) injection 1,000 mcg    Allergies (verified) Methotrexate derivatives, Phenergan [promethazine hcl], Humira [adalimumab], Penicillins, and Remicade [infliximab]   History: Past Medical History:  Diagnosis Date  . Acute endocarditis   . Crohn's disease ([place]   . Depression   . Eating disorder   . GERD (gastroesophageal reflux disease)   . Kidney stones   . Short gut syndrome    Past Surgical History:  Procedure Laterality Date  . COLONOSCOPY  06/11/2020  . COLONOSCOPY  07/05/2018  . COLONOSCOPY WITH PROPOFOL N/A 09/17/2017   Procedure: COLONOSCOPY WITH PROPOFOL;  Surgeon: NMauri Pole MD;  Location: [facility];  Service: Endoscopy;  Laterality: N/A;  Through ileostomy.  .Consuela MimesWITH STENT PLACEMENT Left 05/20/2017   Procedure: CYSTOSCOPY WITH STENT PLACEMENT;  Surgeon: HArdis Hughs MD;  Location: [facility];  Service: Urology;  Laterality: Left;  . ILEOSTOMY    . IR NEPHROSTOMY EXCHANGE LEFT  04/12/2017  . IR NEPHROSTOMY PLACEMENT LEFT  04/12/2017  .  IR NEPHROSTOMY PLACEMENT LEFT  04/29/2017  . NEPHROLITHOTOMY Left 05/14/2017   Procedure: LEFT NEPHROLITHOTOMY PERCUTANEOUS WITH SURGEON ACCESS;  Surgeon: [name], MD;  Location: [facility];  Service: Urology;  Laterality: Left;  . SMALL INTESTINE SURGERY    . SUBTOTAL COLECTOMY     Family History  Problem Relation Age of Onset  . Heart disease Mother   . Diabetes Father   . Heart disease Father   . Heart attack Father   . Melanoma Sister   . Crohn's disease  Brother   . Inflammatory bowel disease Brother   . Diabetes Paternal Grandmother   . Colon cancer Neg Hx   . Rectal cancer Neg Hx   . Stomach cancer Neg Hx   . Esophageal cancer Neg Hx    Social History   Socioeconomic History  . Marital status: Divorced    Spouse name: Not on file  . Number of children: 2  . Years of education: Not on file  . Highest education level: Not on file  Occupational History  . Occupation: Retired  Tobacco Use  . Smoking status: Former Smoker    Packs/day: 1.00    Years: 20.00    Pack years: 20.00    Types: Cigarettes    Quit date: 08/31/2006    Years since quitting: 13.9  . Smokeless tobacco: Never Used  Vaping Use  . Vaping Use: Never used  Substance and Sexual Activity  . Alcohol use: No  . Drug use: No  . Sexual activity: Not on file  Other Topics Concern  . Not on file  Social History Narrative  . Not on file   Social Determinants of Health   Financial Resource Strain: Medium Risk  . Difficulty of Paying Living Expenses: Somewhat hard  Food Insecurity: No Food Insecurity  . Worried About Charity fundraiser in the Last Year: Never true  . Ran Out of Food in the Last Year: Never true  Transportation Needs: No Transportation Needs  . Lack of Transportation (Medical): No  . Lack of Transportation (Non-Medical): No  Physical Activity: Inactive  . Days of Exercise per Week: 0 days  . Minutes of Exercise per Session: 0 min  Stress: No Stress Concern Present  . Feeling of Stress : Not at all  Social Connections: Moderately Isolated  . Frequency of Communication with Friends and Family: More than three times a week  . Frequency of Social Gatherings with Friends and Family: Never  . Attends Religious Services: 1 to 4 times per year  . Active Member of Clubs or Organizations: No  . Attends Archivist Meetings: Never  . Marital Status: Divorced    Tobacco Counseling Counseling given: Not Answered   Clinical  Intake:  Pre-visit preparation completed: Yes  Pain : 0-10 Pain Score: 7  Pain Type: Chronic pain Pain Location: Buttocks Pain Onset: More than a month ago Pain Frequency: Intermittent     BMI - recorded: 17.86 Nutritional Status: BMI <19  Underweight Nutritional Risks: Nausea/ vomitting/ diarrhea (loose stool daily) Diabetes: No  How often do you need to have someone help you when you read instructions, pamphlets, or other written materials from your doctor or pharmacy?: 1 - Never  Diabetic?No  Interpreter Needed?: No  Information entered by :: Charlott Rakes, LPN   Activities of Daily Living In your present state of health, do you have any difficulty performing the following activities: 08/13/2020 12/19/2019  Hearing? N N  Vision? N N  Difficulty concentrating or making decisions? N Y  Comment - due to dehydration  Walking or climbing stairs? N N  Dressing or bathing? N N  Doing errands, shopping? N N  Preparing Food and eating ? N -  Using the Toilet? N -  In the past six months, have you accidently leaked urine? N -  Do you have problems with loss of bowel control? Y -  Managing your Medications? N -  Managing your Finances? N -  Housekeeping or managing your Housekeeping? N -  Some recent data might be hidden    Patient Care Team: Billie Ruddy, MD as PCP - General (Family Medicine)  Indicate any recent Medical Services you may have received from other than [facility] providers in the past year (date may be approximate).     Assessment:   This is a routine wellness examination for Gregory [name].  Hearing/Vision screen  Hearing Screening   125Hz  250Hz  500Hz  1000Hz  2000Hz  3000Hz  4000Hz  6000Hz  8000Hz   Right ear:           Left ear:           Comments: Pt denies any hearing issues   Vision Screening Comments: Pt has not sen a eye Dr in 10 years states he can't afford to at this time  Dietary issues and exercise activities discussed: Current Exercise Habits: The  patient does not participate in regular exercise at present, Exercise limited by: Other - see comments (loose stools wears him out)  Goals    . Patient Stated     Eating healthier that I can toloerate      Depression Screen PHQ 2/9 Scores 08/13/2020 05/11/2017  PHQ - 2 Score 0 0    Fall Risk Fall Risk  08/13/2020 05/11/2017  Falls in the past year? 0 No  Number falls in past yr: 0 -  Injury with Fall? 0 -  Risk for fall due to : Impaired vision;Impaired balance/gait -  Follow up Falls prevention discussed -    FALL RISK PREVENTION PERTAINING TO THE HOME:  Any stairs in or around the home? Yes  If so, are there any without handrails? No  Home free of loose throw rugs in walkways, pet beds, electrical cords, etc? Yes  Adequate lighting in your home to reduce risk of falls? Yes   ASSISTIVE DEVICES UTILIZED TO PREVENT FALLS:  Life alert? No  Use of a cane, walker or w/c? Yes  Grab bars in the bathroom? Yes  Shower chair or bench in shower? Yes  Elevated toilet seat or a handicapped toilet? No   TIMED UP AND GO:  Was the test performed? No .     Cognitive Function:     6CIT Screen 08/13/2020  What Year? 0 points  What month? 0 points  Count back from 20 0 points  Months in reverse 0 points  Repeat phrase 0 points    Immunizations Immunization History  Administered Date(s) Administered  . Influenza,inj,Quad PF,6+ Mos 05/11/2017, 05/03/2019, 05/28/2020  . Moderna Sars-Covid-2 Vaccination 12/05/2019, 01/02/2020, 06/26/2020  . Pneumococcal Conjugate-13 01/10/2018  . Pneumococcal Polysaccharide-23 04/10/2019  . Zoster Recombinat (Shingrix) 06/28/2019    TDAP status: Up to date  Flu Vaccine status: Up to date  Pneumococcal vaccine status: Up to date  Covid-19 vaccine status: Completed vaccines  Qualifies for Shingles Vaccine? Yes   Zostavax completed Yes   Shingrix Completed?: Yes  Screening Tests Health Maintenance  Topic Date Due  . TETANUS/TDAP   08/31/2021  .  COLONOSCOPY  06/11/2030  . INFLUENZA VACCINE  Completed  . COVID-19 Vaccine  Completed  . Hepatitis C Screening  Completed  . HIV Screening  Completed    Health Maintenance  There are no preventive care reminders to display for this patient.  Colorectal cancer screening: Type of screening: Colonoscopy. Completed 06/11/20. Repeat every will revisit [place] years  Additional Screening:  Hepatitis C Screening:  Completed 09/01/15  Vision Screening: Recommended annual ophthalmology exams for early detection of glaucoma and other disorders of the eye. Is the patient up to date with their annual eye exam?  No  Pt states need a free provider referral  he doesn't have the means to pay for eye appt at this time  Dental Screening: Recommended annual dental exams for proper oral hygiene  Community Resource Referral / Chronic Care Management: CRR required this visit?  No   CCM required this visit?  No      Plan:     I have personally reviewed and noted the following in the patient's chart:   . Medical and social history . Use of alcohol, tobacco or illicit drugs  . Current medications and supplements . Functional ability and status . Nutritional status . Physical activity . Advanced directives . List of other physicians . Hospitalizations, surgeries, and ER visits in previous 12 months . Vitals . Screenings to include cognitive, depression, and falls . Referrals and appointments  In addition, I have reviewed and discussed with patient certain preventive protocols, quality metrics, and best practice recommendations. A written personalized care plan for preventive services as well as general preventive health recommendations were provided to patient.     Willette Brace, LPN   [id number]   Nurse Notes: None

## 2020-08-19 ENCOUNTER — Other Ambulatory Visit: Payer: Self-pay | Admitting: Gastroenterology

## 2020-08-19 NOTE — Telephone Encounter (Signed)
Dr. Havery Moros - OK to refill Tramadol for pt? He was last seen in the office in 05-2020 with Colonoscopy in October.  Also - When would you like to see him back in the office?  Thank you.

## 2020-08-19 NOTE — Telephone Encounter (Signed)
Okay to refill. I can see him in March otherwise for routine follow up

## 2020-10-10 ENCOUNTER — Other Ambulatory Visit: Payer: Self-pay

## 2020-10-10 DIAGNOSIS — K50019 Crohn's disease of small intestine with unspecified complications: Secondary | ICD-10-CM

## 2020-10-10 DIAGNOSIS — Z933 Colostomy status: Secondary | ICD-10-CM

## 2020-10-10 DIAGNOSIS — Z79899 Other long term (current) drug therapy: Secondary | ICD-10-CM

## 2020-10-10 DIAGNOSIS — K219 Gastro-esophageal reflux disease without esophagitis: Secondary | ICD-10-CM

## 2020-10-14 ENCOUNTER — Other Ambulatory Visit (INDEPENDENT_AMBULATORY_CARE_PROVIDER_SITE_OTHER): Payer: Medicare Other

## 2020-10-14 DIAGNOSIS — K50019 Crohn's disease of small intestine with unspecified complications: Secondary | ICD-10-CM

## 2020-10-14 DIAGNOSIS — K219 Gastro-esophageal reflux disease without esophagitis: Secondary | ICD-10-CM

## 2020-10-14 DIAGNOSIS — Z79899 Other long term (current) drug therapy: Secondary | ICD-10-CM

## 2020-10-14 DIAGNOSIS — Z933 Colostomy status: Secondary | ICD-10-CM | POA: Diagnosis not present

## 2020-10-14 LAB — CBC WITH DIFFERENTIAL/PLATELET
Basophils Absolute: 0 10*3/uL (ref 0.0–0.1)
Basophils Relative: 0.5 % (ref 0.0–3.0)
Eosinophils Absolute: 0.3 10*3/uL (ref 0.0–0.7)
Eosinophils Relative: 3.3 % (ref 0.0–5.0)
HCT: 44 % (ref 39.0–52.0)
Hemoglobin: 14.6 g/dL (ref 13.0–17.0)
Lymphocytes Relative: 28.3 % (ref 12.0–46.0)
Lymphs Abs: 2.6 10*3/uL (ref 0.7–4.0)
MCHC: 33.2 g/dL (ref 30.0–36.0)
MCV: 97 fl (ref 78.0–100.0)
Monocytes Absolute: 1 10*3/uL (ref 0.1–1.0)
Monocytes Relative: 10.5 % (ref 3.0–12.0)
Neutro Abs: 5.3 10*3/uL (ref 1.4–7.7)
Neutrophils Relative %: 57.4 % (ref 43.0–77.0)
Platelets: 261 10*3/uL (ref 150.0–400.0)
RBC: 4.53 Mil/uL (ref 4.22–5.81)
RDW: 15.7 % — ABNORMAL HIGH (ref 11.5–15.5)
WBC: 9.3 10*3/uL (ref 4.0–10.5)

## 2020-10-14 LAB — COMPREHENSIVE METABOLIC PANEL
ALT: 8 U/L (ref 0–53)
AST: 10 U/L (ref 0–37)
Albumin: 3.7 g/dL (ref 3.5–5.2)
Alkaline Phosphatase: 93 U/L (ref 39–117)
BUN: 13 mg/dL (ref 6–23)
CO2: 25 mEq/L (ref 19–32)
Calcium: 8.5 mg/dL (ref 8.4–10.5)
Chloride: 102 mEq/L (ref 96–112)
Creatinine, Ser: 1.51 mg/dL — ABNORMAL HIGH (ref 0.40–1.50)
GFR: 50.25 mL/min — ABNORMAL LOW (ref >60.00)
Glucose, Bld: 108 mg/dL — ABNORMAL HIGH (ref 70–99)
Potassium: 3.4 mEq/L — ABNORMAL LOW (ref 3.5–5.1)
Sodium: 136 mEq/L (ref 135–145)
Total Bilirubin: 1.2 mg/dL (ref 0.2–1.2)
Total Protein: 6.8 g/dL (ref 6.0–8.3)

## 2020-10-28 NOTE — Telephone Encounter (Signed)
error 

## 2020-11-19 ENCOUNTER — Other Ambulatory Visit: Payer: Self-pay | Admitting: Gastroenterology

## 2020-12-09 ENCOUNTER — Other Ambulatory Visit: Payer: Self-pay

## 2020-12-09 ENCOUNTER — Other Ambulatory Visit: Payer: Self-pay | Admitting: Gastroenterology

## 2020-12-09 MED ORDER — TRAMADOL HCL 50 MG PO TABS
ORAL_TABLET | ORAL | 0 refills | Status: DC
Start: 2020-12-09 — End: 2021-02-12

## 2020-12-09 NOTE — Telephone Encounter (Signed)
Refill sent to [facility]

## 2020-12-10 DIAGNOSIS — Z23 Encounter for immunization: Secondary | ICD-10-CM | POA: Diagnosis not present

## 2020-12-17 ENCOUNTER — Ambulatory Visit (INDEPENDENT_AMBULATORY_CARE_PROVIDER_SITE_OTHER)
Admission: RE | Admit: 2020-12-17 | Discharge: 2020-12-17 | Disposition: A | Discharge status: Home / Self Care | Payer: Medicare Other | Source: Ambulatory Visit | Attending: Gastroenterology | Admitting: Gastroenterology

## 2020-12-17 ENCOUNTER — Encounter: Payer: Self-pay | Admitting: Gastroenterology

## 2020-12-17 ENCOUNTER — Other Ambulatory Visit: Payer: Self-pay

## 2020-12-17 ENCOUNTER — Ambulatory Visit (INDEPENDENT_AMBULATORY_CARE_PROVIDER_SITE_OTHER): Payer: Medicare Other | Admitting: Gastroenterology

## 2020-12-17 VITALS — BP 102/70 | HR 81 | Ht 71.0 in | Wt 127.0 lb

## 2020-12-17 DIAGNOSIS — E559 Vitamin D deficiency, unspecified: Secondary | ICD-10-CM

## 2020-12-17 DIAGNOSIS — M810 Age-related osteoporosis without current pathological fracture: Secondary | ICD-10-CM

## 2020-12-17 DIAGNOSIS — K219 Gastro-esophageal reflux disease without esophagitis: Secondary | ICD-10-CM | POA: Diagnosis not present

## 2020-12-17 DIAGNOSIS — Z79899 Other long term (current) drug therapy: Secondary | ICD-10-CM

## 2020-12-17 DIAGNOSIS — Z933 Colostomy status: Secondary | ICD-10-CM | POA: Diagnosis not present

## 2020-12-17 DIAGNOSIS — K50819 Crohn's disease of both small and large intestine with unspecified complications: Secondary | ICD-10-CM

## 2020-12-17 MED ORDER — OMEPRAZOLE 20 MG PO CPDR: 20.0000 mg | DELAYED_RELEASE_CAPSULE | Freq: Every day | ORAL | 1 refills | Status: DC

## 2020-12-17 MED ORDER — POTASSIUM CHLORIDE 20 MEQ PO PACK: 20.0000 meq | PACK | Freq: Every day | ORAL | 1 refills | Status: AC

## 2020-12-17 NOTE — Progress Notes (Signed)
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 He has acolostomywith a remnant rectal pouch. He reports he has had a bowel surgery in the 1982, with a "few more surgeries" in years after while in [place], as well as in [facility], they tried reversing hiscolostomywhich did not workwell for him, too much diarrhea and it was then reversed back. He has a history of abscess associated with small bowel.   He has been hospitalized multiple times. He previously hadbeen on chronic prednisone for 10 years with fluctuating levels. Hewason Cimzia for about 4-5 years in the past. He has been on sulfasalazine for a long time. He reports an allergy to 6MP - he does not know the details ofthe reaction. He had an allergic reaction - hives, to both Remicade and Humira.Allergy to methotrexate. Failed therapeutic Entyvio, now on Stelara since March 2019, with dosing increase to once monthly.   SINCE LAST VISIT  60 year old male here for follow-up visit for his Crohn's disease.  He remains on Stelara once monthly and is compliant with this.  He states this is done a good job for him in general.  He was last admitted about 1 year ago with symptoms that he was not sure if related to the COVID-vaccine.  He was given IV steroids and quickly improved with a taper off.  He has not needed steroids since that time.  We performed a colonoscopy this past October which showed a short segment of mild inflammation in his ileum but otherwise generally looked much better than his prior exams.  We decided to continue therapy with Stelara.  At baseline he has generally been feeling well.  He did state he had a few days worth of a possible flare of symptoms back in March, states he felt very bloated and gassy and had a hard time eating for 3 to 4 days and then symptoms abated.  During this time he did not eat very  much and lost a few pounds but is starting to regain weight.  He is currently monitoring his diet very closely and eating well.  He is planning on going to the beach for vacation with his sister soon.  He is taking vitamin D 2000 units a day.  He does have a history of osteoporosis on his last DEXA scan over 2 years ago.  He is having worsening reflux symptoms despite taking Pepcid twice daily, symptoms continue to bother him.  He is not taking any NSAIDs at all.  He does not smoke cigarettes.  Using tramadol as needed but not routinely, but definitely helps when he needs it.  Prior evaluation: EGD 03/21/2009 - normal Colonoscopy 08/19/2010 - colo-colonic anastomosis at 60cm, active colitis and nonobstructive stricture at 70cm, ileocolonic stricture at 75cm Colonoscopy 08/2017 - normal colon, active ileitis  Colonoscopy 07/05/2018 - Diffuse inflammation, graded as Rutgeerts Score i3 (diffuse aphthous ileitis) and characterized by erythema and aphthous ulcerations was found in the terminal ileum and at the surgical anastomosis (located in the right colon).Normal colon, however prep was poor in certain portion of the colon and was inadequate for screening purposes to rule out polyps, etc. The ostomy was normal..  Colonoscopy 06/11/20 - Localized inflammation, graded as Rutgeerts Score i2 and characterized by shallow ulcerations was found at the surgical anastomosis. In the distal 10cm of ileum there were scattered small ulcerations with normal intervening mucosa. The small bowel was deeply  intubated and mucosa proximal to the distal 10cm of ileum was entirely normal. Scattered medium-mouthed diverticula were found in the entire colon. The exam was otherwise without abnormality. No colonic inflammation. No polyps.  IBD Health Care Maintenance: Annual Flu Vaccine-2021 UTD Pneumococcal Vaccineif receiving immunosuppression: - PCV 13 01/10/18, PPSV238/10/20 Zoster vaccineif over age  48:06/28/19 TB testingif on anti-TNF, yearly - Date 4/20 negative Vitamin Dscreening - Date 4/20 - deficient Last Colonoscopy -07/2018 COVID 19 vaccine UTD  CT 12/19/19 -IMPRESSION: 1. Progressive wall thickening involving a loop of ileum in the left upper and mid pelvis, distal to an anastomosis in the left mid abdomen. This is compatible mildly progressive thickening associated with the patient's known Crohn's disease. A neoplastic process can not be excluded but is less likely. 2. 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 3. Pronounced bullous changes in the inferior left upper lobe.   Past Medical History:  Diagnosis Date  . Acute endocarditis   . Crohn's disease ([place])   . Depression   . Eating disorder   . GERD (gastroesophageal reflux disease)   . Kidney stones   . Short gut syndrome      Past Surgical History:  Procedure Laterality Date  . COLONOSCOPY  06/11/2020  . COLONOSCOPY  07/05/2018  . COLONOSCOPY WITH PROPOFOL N/A 09/17/2017   Procedure: COLONOSCOPY WITH PROPOFOL;  Surgeon: [name], MD;  Location: [facility];  Service: Endoscopy;  Laterality: N/A;  Through ileostomy.  [name] WITH STENT PLACEMENT Left 05/20/2017   Procedure: CYSTOSCOPY WITH STENT PLACEMENT;  Surgeon: [name], MD;  Location: [facility];  Service: Urology;  Laterality: Left;  . ILEOSTOMY    . IR NEPHROSTOMY EXCHANGE LEFT  04/12/2017  . IR NEPHROSTOMY PLACEMENT LEFT  04/12/2017  . IR NEPHROSTOMY PLACEMENT LEFT  04/29/2017  . NEPHROLITHOTOMY Left 05/14/2017   Procedure: LEFT NEPHROLITHOTOMY PERCUTANEOUS WITH SURGEON ACCESS;  Surgeon: [name], MD;  Location: [facility];  Service: Urology;  Laterality: Left;  . SMALL INTESTINE SURGERY    . SUBTOTAL COLECTOMY     Family History  Problem Relation Age of Onset  . Heart disease Mother   . Diabetes Father   . Heart disease Father   . Heart attack Father   . Melanoma Sister   . Crohn's  disease Brother   . Inflammatory bowel disease Brother   . Diabetes Paternal Grandmother   . Colon cancer Neg Hx   . Rectal cancer Neg Hx   . Stomach cancer Neg Hx   . Esophageal cancer Neg Hx    Social History   Tobacco Use  . Smoking status: Former Smoker    Packs/day: 1.00    Years: 20.00    Pack years: 20.00    Types: Cigarettes    Quit date: 08/31/2006    Years since quitting: 14.3  . Smokeless tobacco: Never Used  Vaping Use  . Vaping Use: Never used  Substance Use Topics  . Alcohol use: No  . Drug use: No   Current Outpatient Medications  Medication Sig Dispense Refill  . acetaminophen (TYLENOL) 500 MG tablet Take 500 mg by mouth every 6 (six) hours as needed for moderate pain.    . diphenoxylate-atropine (LOMOTIL) 2.5-0.025 MG tablet TAKE 1 TABLET 4 TIMES A DAY AS NEEDED FOR DIARRHEA OR LOOSE STOOLS 120 tablet 0  . famotidine (PEPCID) 20 MG tablet Take 1 tablet (20 mg total) by mouth daily. 90 tablet 1  . Multiple Vitamin (MULTIVITAMIN  WITH MINERALS) TABS tablet Take 1 tablet by mouth daily. 30 tablet 3  . ondansetron (ZOFRAN) 4 MG tablet TAKE 1 TABLET EVERY 8 HOURS AS NEEDED FOR NASUEA OR VOMITING 30 tablet 0  . potassium chloride (KLOR-CON) 20 MEQ packet Take 20 mEq by mouth daily. 90 each 1  . sertraline (ZOLOFT) 50 MG tablet Take 1 tablet (50 mg total) by mouth daily. (Patient taking differently: Take 50 mg by mouth daily as needed (down spells?).) 90 tablet 1  . traMADol (ULTRAM) 50 MG tablet TAKE (1) TABLET EVERY EIGHT HOURS AS NEEDED FOR PAIN. 90 tablet 0  . ustekinumab (STELARA) 90 MG/ML SOSY injection Inject 1 mL (90 mg total) into the skin every 8 (eight) weeks. (Patient taking differently: Inject 90 mg into the skin every 30 (thirty) days.) 1 Syringe 6  . Vitamin D, Ergocalciferol, 2000 units CAPS Take 1 capsule by mouth daily.     Current Facility-Administered Medications  Medication Dose Route Frequency Provider Last Rate Last Admin  . cyanocobalamin  ((VITAMIN B-12)) injection 1,000 mcg  1,000 mcg Intramuscular Q30 days Yetta Flock, MD   1,000 mcg at 07/10/19 1411   Allergies  Allergen Reactions  . Methotrexate Derivatives Other (See Comments)    Anemia, low WBC, severe GI symptoms,   . Phenergan [Promethazine Hcl] Other (See Comments)    twitching  . Humira [Adalimumab] Other (See Comments)    Intolerance  . Penicillins Other (See Comments)    REACTION DURING SURGERY Has patient had a PCN reaction causing immediate rash, facial/tongue/throat swelling, SOB or lightheadedness with hypotension: Unknown Has patient had a PCN reaction causing severe rash involving mucus membranes or skin necrosis: Unknown Has patient had a PCN reaction that required hospitalization: Unknown Has patient had a PCN reaction occurring within the last 10 years: No If all of the above answers are "NO", then may proceed with Cephalosporin use.   . Remicade [Infliximab] Other (See Comments)    Intolerance     Review of Systems: All systems reviewed and negative except where noted in HPI.   Lab Results  Component Value Date   WBC 9.3 10/14/2020   HGB 14.6 10/14/2020   HCT 44.0 10/14/2020   MCV 97.0 10/14/2020   PLT 261.0 10/14/2020    Lab Results  Component Value Date   CREATININE 1.51 (H) 10/14/2020   BUN 13 10/14/2020   NA 136 10/14/2020   K 3.4 (L) 10/14/2020   CL 102 10/14/2020   CO2 25 10/14/2020    Lab Results  Component Value Date   ALT 8 10/14/2020   AST 10 10/14/2020   ALKPHOS 93 10/14/2020   BILITOT 1.2 10/14/2020     Physical Exam: BP 102/70   Pulse 81   Ht 5' 11"  (1.803 m)   Wt 127 lb (57.6 kg)   SpO2 99%   BMI 17.71 kg/m  Constitutional: Pleasant,well-developed, male in no acute distress. Abdominal: Soft, nondistended, nontender.  There are no masses palpable. Colostomy in LLQ Extremities: no edema Lymphadenopathy: No cervical adenopathy noted. Neurological: Alert and oriented to person place and  time. Skin: Skin is warm and dry. No rashes noted. Psychiatric: Normal mood and affect. Behavior is normal.   ASSESSMENT AND PLAN: 60 year old male here for reassessment of following:  Crohn's disease Colostomy left lower quadrant High risk medication use Vitamin D deficiency GERD Osteoporosis  As above, complicated Crohn's disease with history of multiple resections, component of short gut, failure multiple regimens to date.  Was recently  on Stelara monotherapy dosed every month and subjectively has done much better on this regimen.  Last admitted over a year ago and now, which has been an improvement for him.  Last colonoscopy showed interval improvement of the inflammation.  We discussed that given the severity of his disease and failure of multiple regimens today it may be quite difficult to get him in complete remission endoscopically, but Stelara has clearly provided benefit and kept him out of the hospital.  He is tolerating it well.  He had a few days of very mild symptoms that raise question of possible mild obstruction, however this passed without intervention.  We discussed if he wanted follow-up imaging for this or reevaluation and he does not if he continues to feel well.  He is due for basic labs this August.  I recommend he increase his vitamin D intake to 4000 units a day given his history of deficiency.  If he has recurrent symptoms of bother him recommend he go on liquid diet and may consider trial of budesonide.  He is due for a follow-up DEXA scan to reassess his osteoporosis, he may need a referral to endocrinology pending this result.  Regarding his persistent reflux symptoms despite Pepcid, we will transition him to omeprazole 20 mg a day, we had try to avoid that in light of osteoporosis however his reflux is really bothering him and I think benefits outweigh risks.  He will continue Stelara.  I will see him again in 6 months if not sooner with any issues.  He will call me with  flare of any symptoms.  Plan: - continue Stelara every 4 weeks - basic labs - CBC, CMET, vitamin D in August - refilled K supplementation - increase vitamin D to 4000 IU / day - DEXA scan to reassess osteoporosis, consider referral to endocrinology - stop pepcid, transition to omeprazole 66m / day - call with any recurrence of flare of symptoms, would recommend liquid diet and trial of budesonide if this recurs - follow up in 6 months or sooner with issues  SCarolina Cellar MD [facility]

## 2020-12-17 NOTE — Patient Instructions (Addendum)
If you are age 61 or older, your body mass index should be between 23-30. Your Body mass index is 17.71 kg/m. If this is out of the aforementioned range listed, please consider follow up with your Primary Care Provider.  If you are age 42 or younger, your body mass index should be between 19-25. Your Body mass index is 17.71 kg/m. If this is out of the aformentioned range listed, please consider follow up with your Primary Care Provider.   Continue Stelara every 4 weeks  Increase your Vitamin D to 4000 units daily.  We have sent the following medications to your pharmacy for you to pick up at your convenience: Potassium: Take once daily Omeprazole 20 mg: Take once daily   Discontinue Pepcid  Please go to the lab in the basement of our building to have Dexa Scan done as you leave today. Hit "B" for basement when you get on the elevator.  When the doors open Radiology is straight ahead.  We will call you with the results. Thank you.  You will be due for labs in August. I will call you to remind you.   Thank you for entrusting me with your care and for choosing [facility], Dr. Sutton Cellar

## 2021-01-01 MED ORDER — PREDNISONE 10 MG PO TABS: ORAL_TABLET | ORAL | 0 refills | Status: AC

## 2021-01-01 NOTE — Telephone Encounter (Signed)
Prescription sent to requested pharmacy. Pt notified via My Chart

## 2021-01-07 ENCOUNTER — Other Ambulatory Visit: Payer: Self-pay

## 2021-01-07 NOTE — Telephone Encounter (Signed)
Received a fax from [facility] for continuance of therapy of Stelara 90 mg every 4 weeks. Refill authorized with 5 additional refills. Completed form was faxed back to [phone].

## 2021-01-31 ENCOUNTER — Telehealth: Payer: Self-pay | Admitting: Gastroenterology

## 2021-01-31 NOTE — Telephone Encounter (Signed)
Called and let patient know we got the disability paperwork  He dropped off. I let him know Dr. Havery Moros will work on Filling out his portion next week. When it is complete we will scan to his chart and mail it to [facility] in the envelop he provided.

## 2021-01-31 NOTE — Telephone Encounter (Signed)
Inbound call from patient requesting a call back please.  States he needs the address to where insurance papers that Dr. Havery Moros fills out for him yearly need to be dropped off at.  Please advise.

## 2021-02-04 NOTE — Telephone Encounter (Signed)
Got it, thanks Jan. I am back in the office today and will get this done as soon as I can

## 2021-02-11 NOTE — Telephone Encounter (Signed)
Disability paperwork completed by provider and mailed to [employer].

## 2021-02-12 ENCOUNTER — Other Ambulatory Visit: Payer: Self-pay | Admitting: Gastroenterology

## 2021-03-02 ENCOUNTER — Other Ambulatory Visit: Payer: Self-pay | Admitting: Gastroenterology

## 2021-03-04 NOTE — Telephone Encounter (Signed)
Patient should contact the office to discuss if still flaring

## 2021-04-01 ENCOUNTER — Telehealth: Payer: Self-pay

## 2021-04-01 ENCOUNTER — Other Ambulatory Visit: Payer: Self-pay

## 2021-04-01 DIAGNOSIS — K50819 Crohn's disease of both small and large intestine with unspecified complications: Secondary | ICD-10-CM

## 2021-04-01 DIAGNOSIS — E559 Vitamin D deficiency, unspecified: Secondary | ICD-10-CM

## 2021-04-01 DIAGNOSIS — D649 Anemia, unspecified: Secondary | ICD-10-CM

## 2021-04-01 DIAGNOSIS — Z79899 Other long term (current) drug therapy: Secondary | ICD-10-CM

## 2021-04-01 NOTE — Progress Notes (Signed)
Labs due for Dr. Havery Moros in August

## 2021-04-01 NOTE — Telephone Encounter (Signed)
MyChart message sent to patient to go for labs. Orders are in

## 2021-04-01 NOTE — Telephone Encounter (Signed)
-----   Message from Roetta Sessions, [place] sent at 12/17/2020  2:06 PM EDT ----- Regarding: labs due in August Due for cbc, cmet and Vitamin d in August

## 2021-04-03 ENCOUNTER — Other Ambulatory Visit (INDEPENDENT_AMBULATORY_CARE_PROVIDER_SITE_OTHER): Payer: Medicare Other

## 2021-04-03 DIAGNOSIS — D649 Anemia, unspecified: Secondary | ICD-10-CM

## 2021-04-03 DIAGNOSIS — E559 Vitamin D deficiency, unspecified: Secondary | ICD-10-CM | POA: Diagnosis not present

## 2021-04-03 DIAGNOSIS — K50819 Crohn's disease of both small and large intestine with unspecified complications: Secondary | ICD-10-CM | POA: Diagnosis not present

## 2021-04-03 DIAGNOSIS — Z79899 Other long term (current) drug therapy: Secondary | ICD-10-CM | POA: Diagnosis not present

## 2021-04-03 LAB — COMPREHENSIVE METABOLIC PANEL
ALT: 9 U/L (ref 0–53)
AST: 13 U/L (ref 0–37)
Albumin: 3.2 g/dL — ABNORMAL LOW (ref 3.5–5.2)
Alkaline Phosphatase: 82 U/L (ref 39–117)
BUN: 9 mg/dL (ref 6–23)
CO2: 20 mEq/L (ref 19–32)
Calcium: 8.2 mg/dL — ABNORMAL LOW (ref 8.4–10.5)
Chloride: 106 mEq/L (ref 96–112)
Creatinine, Ser: 1.25 mg/dL (ref 0.40–1.50)
GFR: 62.83 mL/min (ref >60.00)
Glucose, Bld: 128 mg/dL — ABNORMAL HIGH (ref 70–99)
Potassium: 3.5 mEq/L (ref 3.5–5.1)
Sodium: 136 mEq/L (ref 135–145)
Total Bilirubin: 0.9 mg/dL (ref 0.2–1.2)
Total Protein: 5.9 g/dL — ABNORMAL LOW (ref 6.0–8.3)

## 2021-04-03 LAB — CBC WITH DIFFERENTIAL/PLATELET
Basophils Absolute: 0 10*3/uL (ref 0.0–0.1)
Basophils Relative: 0.4 % (ref 0.0–3.0)
Eosinophils Absolute: 0.3 10*3/uL (ref 0.0–0.7)
Eosinophils Relative: 3 % (ref 0.0–5.0)
HCT: 38.7 % — ABNORMAL LOW (ref 39.0–52.0)
Hemoglobin: 12.8 g/dL — ABNORMAL LOW (ref 13.0–17.0)
Lymphocytes Relative: 25.4 % (ref 12.0–46.0)
Lymphs Abs: 2.4 10*3/uL (ref 0.7–4.0)
MCHC: 33.2 g/dL (ref 30.0–36.0)
MCV: 103.6 fl — ABNORMAL HIGH (ref 78.0–100.0)
Monocytes Absolute: 0.7 10*3/uL (ref 0.1–1.0)
Monocytes Relative: 7.3 % (ref 3.0–12.0)
Neutro Abs: 6 10*3/uL (ref 1.4–7.7)
Neutrophils Relative %: 63.9 % (ref 43.0–77.0)
Platelets: 251 10*3/uL (ref 150.0–400.0)
RBC: 3.74 Mil/uL — ABNORMAL LOW (ref 4.22–5.81)
RDW: 15.8 % — ABNORMAL HIGH (ref 11.5–15.5)
WBC: 9.5 10*3/uL (ref 4.0–10.5)

## 2021-04-03 LAB — VITAMIN D 25 HYDROXY (VIT D DEFICIENCY, FRACTURES): VITD: 20.73 ng/mL — ABNORMAL LOW (ref 30.00–100.00)

## 2021-04-11 ENCOUNTER — Other Ambulatory Visit: Payer: Self-pay | Admitting: Gastroenterology

## 2021-05-14 ENCOUNTER — Telehealth: Payer: Self-pay

## 2021-05-14 DIAGNOSIS — Z79899 Other long term (current) drug therapy: Secondary | ICD-10-CM

## 2021-05-14 DIAGNOSIS — K50819 Crohn's disease of both small and large intestine with unspecified complications: Secondary | ICD-10-CM

## 2021-05-14 MED ORDER — STELARA 90 MG/ML ~~LOC~~ SOSY: 90.0000 mg | PREFILLED_SYRINGE | SUBCUTANEOUS | 6 refills | Status: DC

## 2021-05-14 NOTE — Telephone Encounter (Signed)
Received a fax from [facility]  - Request for continuance of therapy for Stelara 90 mg under the skin every 4 weeks. Refill sent to [facility] through epic for documentation.

## 2021-05-20 ENCOUNTER — Other Ambulatory Visit: Payer: Self-pay | Admitting: Gastroenterology

## 2021-05-21 ENCOUNTER — Encounter: Payer: Self-pay | Admitting: Family Medicine

## 2021-05-22 ENCOUNTER — Telehealth: Payer: Medicare Other | Admitting: Family Medicine

## 2021-05-22 ENCOUNTER — Encounter (HOSPITAL_BASED_OUTPATIENT_CLINIC_OR_DEPARTMENT_OTHER): Payer: Self-pay | Admitting: Obstetrics and Gynecology

## 2021-05-22 ENCOUNTER — Other Ambulatory Visit: Payer: Self-pay

## 2021-05-22 ENCOUNTER — Inpatient Hospital Stay (HOSPITAL_BASED_OUTPATIENT_CLINIC_OR_DEPARTMENT_OTHER)
Admission: EM | Admit: 2021-05-22 | Discharge: 2021-05-25 | DRG: 385 | Disposition: A | Discharge status: Home / Self Care | Payer: Medicare Other | Attending: Internal Medicine | Admitting: Internal Medicine

## 2021-05-22 DIAGNOSIS — E876 Hypokalemia: Secondary | ICD-10-CM | POA: Diagnosis present

## 2021-05-22 DIAGNOSIS — F32A Depression, unspecified: Secondary | ICD-10-CM | POA: Diagnosis present

## 2021-05-22 DIAGNOSIS — E611 Iron deficiency: Secondary | ICD-10-CM | POA: Diagnosis present

## 2021-05-22 DIAGNOSIS — Z8659 Personal history of other mental and behavioral disorders: Secondary | ICD-10-CM

## 2021-05-22 DIAGNOSIS — Z8249 Family history of ischemic heart disease and other diseases of the circulatory system: Secondary | ICD-10-CM

## 2021-05-22 DIAGNOSIS — D696 Thrombocytopenia, unspecified: Secondary | ICD-10-CM | POA: Diagnosis present

## 2021-05-22 DIAGNOSIS — R636 Underweight: Secondary | ICD-10-CM | POA: Diagnosis present

## 2021-05-22 DIAGNOSIS — Z79891 Long term (current) use of opiate analgesic: Secondary | ICD-10-CM

## 2021-05-22 DIAGNOSIS — Z933 Colostomy status: Secondary | ICD-10-CM

## 2021-05-22 DIAGNOSIS — D7589 Other specified diseases of blood and blood-forming organs: Secondary | ICD-10-CM | POA: Diagnosis present

## 2021-05-22 DIAGNOSIS — A0839 Other viral enteritis: Secondary | ICD-10-CM | POA: Diagnosis present

## 2021-05-22 DIAGNOSIS — U071 COVID-19: Secondary | ICD-10-CM | POA: Diagnosis not present

## 2021-05-22 DIAGNOSIS — N179 Acute kidney failure, unspecified: Secondary | ICD-10-CM | POA: Diagnosis present

## 2021-05-22 DIAGNOSIS — K219 Gastro-esophageal reflux disease without esophagitis: Secondary | ICD-10-CM | POA: Diagnosis present

## 2021-05-22 DIAGNOSIS — K50818 Crohn's disease of both small and large intestine with other complication: Secondary | ICD-10-CM | POA: Diagnosis not present

## 2021-05-22 DIAGNOSIS — K5080 Crohn's disease of both small and large intestine without complications: Principal | ICD-10-CM | POA: Diagnosis present

## 2021-05-22 DIAGNOSIS — Z9049 Acquired absence of other specified parts of digestive tract: Secondary | ICD-10-CM | POA: Diagnosis not present

## 2021-05-22 DIAGNOSIS — Z681 Body mass index (BMI) 19 or less, adult: Secondary | ICD-10-CM

## 2021-05-22 DIAGNOSIS — E538 Deficiency of other specified B group vitamins: Secondary | ICD-10-CM | POA: Diagnosis present

## 2021-05-22 DIAGNOSIS — Z79899 Other long term (current) drug therapy: Secondary | ICD-10-CM | POA: Diagnosis not present

## 2021-05-22 DIAGNOSIS — Z880 Allergy status to penicillin: Secondary | ICD-10-CM

## 2021-05-22 DIAGNOSIS — E860 Dehydration: Secondary | ICD-10-CM | POA: Diagnosis present

## 2021-05-22 DIAGNOSIS — J439 Emphysema, unspecified: Secondary | ICD-10-CM | POA: Diagnosis present

## 2021-05-22 DIAGNOSIS — Z888 Allergy status to other drugs, medicaments and biological substances status: Secondary | ICD-10-CM

## 2021-05-22 DIAGNOSIS — K5090 Crohn's disease, unspecified, without complications: Secondary | ICD-10-CM | POA: Diagnosis present

## 2021-05-22 DIAGNOSIS — Z87891 Personal history of nicotine dependence: Secondary | ICD-10-CM

## 2021-05-22 LAB — CBC
HCT: 45.6 % (ref 39.0–52.0)
Hemoglobin: 15.4 g/dL (ref 13.0–17.0)
MCH: 34.1 pg — ABNORMAL HIGH (ref 26.0–34.0)
MCHC: 33.8 g/dL (ref 30.0–36.0)
MCV: 100.9 fL — ABNORMAL HIGH (ref 80.0–100.0)
Platelets: 170 10*3/uL (ref 150–400)
RBC: 4.52 MIL/uL (ref 4.22–5.81)
RDW: 16.4 % — ABNORMAL HIGH (ref 11.5–15.5)
WBC: 5.4 10*3/uL (ref 4.0–10.5)
nRBC: 0 % (ref 0.0–0.2)

## 2021-05-22 LAB — RESP PANEL BY RT-PCR (FLU A&B, COVID) ARPGX2
Influenza A by PCR: NEGATIVE
Influenza B by PCR: NEGATIVE
SARS Coronavirus 2 by RT PCR: POSITIVE — AB

## 2021-05-22 LAB — LIPASE, BLOOD: Lipase: 80 U/L — ABNORMAL HIGH (ref 11–51)

## 2021-05-22 LAB — COMPREHENSIVE METABOLIC PANEL
ALT: 10 U/L (ref 0–44)
AST: 23 U/L (ref 15–41)
Albumin: 4.1 g/dL (ref 3.5–5.0)
Alkaline Phosphatase: 68 U/L (ref 38–126)
Anion gap: 14 (ref 5–15)
BUN: 20 mg/dL (ref 6–20)
CO2: 20 mmol/L — ABNORMAL LOW (ref 22–32)
Calcium: 8.9 mg/dL (ref 8.9–10.3)
Chloride: 100 mmol/L (ref 98–111)
Creatinine, Ser: 2.13 mg/dL — ABNORMAL HIGH (ref 0.61–1.24)
GFR, Estimated: 35 mL/min — ABNORMAL LOW (ref >60)
Glucose, Bld: 112 mg/dL — ABNORMAL HIGH (ref 70–99)
Potassium: 3.3 mmol/L — ABNORMAL LOW (ref 3.5–5.1)
Sodium: 134 mmol/L — ABNORMAL LOW (ref 135–145)
Total Bilirubin: 0.9 mg/dL (ref 0.3–1.2)
Total Protein: 7.2 g/dL (ref 6.5–8.1)

## 2021-05-22 MED ORDER — PREDNISONE 50 MG PO TABS
60.0000 mg | ORAL_TABLET | Freq: Once | ORAL | Status: AC
  Administered 2021-05-22: 60 mg via ORAL
  Filled 2021-05-22: qty 1

## 2021-05-22 MED ORDER — ONDANSETRON HCL 4 MG/2ML IJ SOLN
4.0000 mg | Freq: Once | INTRAMUSCULAR | Status: AC
  Administered 2021-05-22: 4 mg via INTRAVENOUS
  Filled 2021-05-22: qty 2

## 2021-05-22 MED ORDER — LACTATED RINGERS IV BOLUS
1000.0000 mL | Freq: Once | INTRAVENOUS | Status: AC
  Administered 2021-05-22: 1000 mL via INTRAVENOUS

## 2021-05-22 MED ORDER — LACTATED RINGERS IV SOLN: INTRAVENOUS | Status: AC

## 2021-05-22 MED ORDER — ENOXAPARIN SODIUM 30 MG/0.3ML IJ SOSY
30.0000 mg | PREFILLED_SYRINGE | INTRAMUSCULAR | Status: DC
  Administered 2021-05-23 – 2021-05-24 (×2): 30 mg via SUBCUTANEOUS
  Filled 2021-05-22 (×2): qty 0.3

## 2021-05-22 NOTE — ED Notes (Signed)
Called [facility] to advise patient is ready to be transported to [facility] 62M room 21

## 2021-05-22 NOTE — ED Notes (Signed)
Called [facility]--Jamie and patient placement s/w Cathy to advise patient is covid positive

## 2021-05-22 NOTE — ED Provider Notes (Signed)
[facility]ERG[facility] DEPT Provider Note   CSN: [id number] Arrival date & time: 05/22/21  1515     History Chief Complaint  Patient presents with   Abdominal Pain   Nausea    Gregory [name]. is a 60 y.o. male with a past medical history significant for Crohn's with an ostomy secondary to multiple small bowel resections who is currently taking Stelara who presents with recent lack of appetite, watery diarrhea into his ostomy bag, fatigue, fuzzy headed sensation.  Patient reports that he called his gastroenterologist, and was sent to the ER because of likely dehydration and need for fluids.  Patient reports that he is generally fairly well controlled on Stelara, normally needs fluid rehydration and then nausea medication and does okay normally.  Patient denies abdominal pain, denies blood in his ostomy bag, denies vomiting, denies chest pain, shortness of breath, dysuria, urinary frequency.  Patient is not in significant pain at time of arrival, has not taken anything for his nausea at this time.  Patient does endorse poor oral intake of water over the last several days.   Abdominal Pain Associated symptoms: fatigue and nausea       Past Medical History:  Diagnosis Date   Acute endocarditis    Crohn's disease ([place])    Depression    Eating disorder    GERD (gastroesophageal reflux disease)    Kidney stones    Short gut syndrome     Patient Active Problem List   Diagnosis Date Noted   Crohn's disease ([place]) 05/22/2021   Exacerbation of Crohn's disease of small intestine ([place]) 12/19/2019   Crohn's disease of both small and lg int w unsp comp ([place]) 01/17/2018   Dehydration 01/17/2018   Hypokalemia 09/15/2017   AKI (acute kidney injury) ([place]) 09/15/2017   Hypomagnesemia 09/15/2017   Anemia 05/18/2017   Protein-calorie malnutrition, severe 04/30/2017   Anuresis 04/28/2017   Physical deconditioning 04/28/2017   Protein calorie malnutrition ([place]) 04/28/2017    Abdominal pain, left upper quadrant 04/28/2017   GERD (gastroesophageal reflux disease) 04/28/2017   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   CYSTOSCOPY WITH STENT PLACEMENT Left 05/20/2017   Procedure: CYSTOSCOPY WITH STENT PLACEMENT;  Surgeon: [name], MD;  Location: [facility];  Service: Urology;  Laterality: Left;   ILEOSTOMY     IR NEPHROSTOMY EXCHANGE LEFT  04/12/2017   IR NEPHROSTOMY PLACEMENT LEFT  04/12/2017   IR NEPHROSTOMY PLACEMENT LEFT  04/29/2017   NEPHROLITHOTOMY Left 05/14/2017   Procedure: LEFT NEPHROLITHOTOMY PERCUTANEOUS WITH SURGEON ACCESS;  Surgeon: [name], MD;  Location: [facility];  Service: Urology;  Laterality: Left;   SMALL INTESTINE SURGERY     SUBTOTAL COLECTOMY         Family History  Problem Relation Age of Onset   Heart disease Mother    Diabetes Father    Heart disease Father    Heart attack Father    Melanoma Sister    Crohn's disease Brother    Inflammatory bowel disease Brother  Diabetes Paternal Grandmother    Colon cancer Neg Hx    Rectal cancer Neg Hx    Stomach cancer Neg Hx    Esophageal cancer Neg Hx     Social History   Tobacco Use   Smoking status: Former    Packs/day: 1.00    Years: 20.00    Pack years: 20.00    Types: Cigarettes    Quit date: 08/31/2006    Years since quitting: 14.7   Smokeless tobacco: Never  Vaping Use   Vaping Use: Never used  Substance Use Topics   Alcohol use: No   Drug use: No    Home Medications Prior to Admission  medications   Medication Sig Start Date End Date Taking? Authorizing Provider  acetaminophen (TYLENOL) 500 MG tablet Take 500 mg by mouth every 6 (six) hours as needed for moderate pain.    [provider]  diphenoxylate-atropine (LOMOTIL) 2.5-0.025 MG tablet TAKE 1 TABLET 4 TIMES A DAY AS NEEDED FOR DIARRHEA OR LOOSE STOOLS 05/20/21   Armbruster, Carlota Raspberry, MD  Multiple Vitamin (MULTIVITAMIN WITH MINERALS) TABS tablet Take 1 tablet by mouth daily. 05/03/17   Rai, Vernelle Emerald, MD  omeprazole (PRILOSEC) 20 MG capsule Take 1 capsule (20 mg total) by mouth daily. 12/17/20   Armbruster, Carlota Raspberry, MD  ondansetron (ZOFRAN) 4 MG tablet TAKE 1 TABLET EVERY 8 HOURS AS NEEDED FOR NASUEA OR VOMITING 05/20/21   Armbruster, Carlota Raspberry, MD  potassium chloride (KLOR-CON) 20 MEQ packet Take 20 mEq by mouth daily. 12/17/20   Armbruster, Carlota Raspberry, MD  sertraline (ZOLOFT) 50 MG tablet Take 1 tablet (50 mg total) by mouth daily. Patient taking differently: Take 50 mg by mouth daily as needed (down spells?). 08/26/17   Billie Ruddy, MD  traMADol (ULTRAM) 50 MG tablet TAKE ONE TABLET EVERY 8 HOURS AS NEEDED FOR PAIN 04/11/21   Armbruster, Carlota Raspberry, MD  ustekinumab (STELARA) 90 MG/ML SOSY injection Inject 1 mL (90 mg total) into the skin every 28 (twenty-eight) days. 05/14/21   Armbruster, Carlota Raspberry, MD  Vitamin D, Ergocalciferol, 2000 units CAPS Take 2 capsules by mouth daily.    [provider]    Allergies    Methotrexate derivatives, Phenergan [promethazine hcl], Humira [adalimumab], Penicillins, and Remicade [infliximab]  Review of Systems   Review of Systems  Constitutional:  Positive for fatigue.  Gastrointestinal:  Positive for abdominal pain and nausea.  All other systems reviewed and are negative.  Physical Exam Updated Vital Signs BP 106/76   Pulse 72   Temp 98 F (36.7 C)   Resp 16   Wt 56.7 kg   SpO2 100%   BMI 17.43 kg/m   Physical Exam Vitals and nursing note reviewed.   Constitutional:      Appearance: He is ill-appearing. He is not toxic-appearing or diaphoretic.     Comments: Very thin man lying on bed in no acute distress.  HENT:     Head: Normocephalic and atraumatic.     Mouth/Throat:     Mouth: Mucous membranes are dry.  Eyes:     General: Scleral icterus present.        Right eye: No discharge.        Left eye: No discharge.     Extraocular Movements: Extraocular movements intact.  Cardiovascular:     Rate and Rhythm: Normal rate and regular rhythm.     Heart sounds: No murmur heard.   No friction rub. No gallop.  Pulmonary:  Effort: Pulmonary effort is normal.     Breath sounds: Normal breath sounds.  Abdominal:     General: Bowel sounds are normal.     Palpations: Abdomen is soft.     Comments: Ostomy bag in place in the left lower quadrant.  No significant tenderness to palpation across all fields of abdomen.  Several well-healed scars in place.  No appreciable hernias.  No rebound, rigidity, guarding of the abdomen.  Skin:    General: Skin is warm and dry.     Capillary Refill: Capillary refill takes less than 2 seconds.  Neurological:     Mental Status: He is alert and oriented to person, place, and time.  Psychiatric:        Mood and Affect: Mood normal.        Behavior: Behavior normal.    ED Results / Procedures / Treatments   Labs (all labs ordered are listed, but only abnormal results are displayed) Labs Reviewed  LIPASE, BLOOD - Abnormal; Notable for the following components:      Result Value   Lipase 80 (*)    All other components within normal limits  COMPREHENSIVE METABOLIC PANEL - Abnormal; Notable for the following components:   Sodium 134 (*)    Potassium 3.3 (*)    CO2 20 (*)    Glucose, Bld 112 (*)    Creatinine, Ser 2.13 (*)    GFR, Estimated 35 (*)    All other components within normal limits  CBC - Abnormal; Notable for the following components:   MCV 100.9 (*)    MCH 34.1 (*)    RDW 16.4 (*)     All other components within normal limits  RESP PANEL BY RT-PCR (FLU A&B, COVID) [id number]  URINALYSIS, ROUTINE W REFLEX MICROSCOPIC  C-REACTIVE PROTEIN    EKG None  Radiology No results found.  Procedures Procedures   Medications Ordered in ED Medications  ondansetron (ZOFRAN) injection 4 mg (4 mg Intravenous Given 05/22/21 1924)  lactated ringers bolus 1,000 mL (1,000 mLs Intravenous New Bag/Given 05/22/21 1930)  predniSONE (DELTASONE) tablet 60 mg (60 mg Oral Given 05/22/21 2053)    ED Course  I have reviewed the triage vital signs and the nursing notes.  Pertinent labs & imaging results that were available during my care of the patient were reviewed by me and considered in my medical decision making (see chart for details).  Clinical Course as of 05/22/21 2128  Thu May 22, 3880  5936 60 year old male history of Crohn's here with poor p.o. intake.  Has new AKI.  Ongoing stool.  Will need admission for continued hydration. [MB]    Clinical Course User Index [MB] Hayden Rasmussen, MD   MDM Rules/Calculators/A&P                         Acute kidney injury based on recent lab work.  Creatinine of 2.13 up from 1.25 at most recent lab work from 1 month ago.  Minimally elevated lipase, without appreciable epigastric tenderness.  Lab work otherwise unremarkable.  Patient appears clinically dry on exam, borderline low blood pressure, dry mucous membranes, and the elevated creatinine as discussed above.  Favor Crohn's flare given increased ostomy output, lack of appetite, general malaise.  On reevaluation, patient does endorse some abdominal pain, but reports that it is minimal compared to his normal standard of abdominal pain which is why he did not mention it as a prevailing symptom on  initial presentation.  For hospital admission on the basis of acute kidney injury, and Crohn's flare requiring steroids and IV rehydration.  Hospitalist accepts admission, and requests we begin steroids,  and run urinalysis as well as a CRP at this time.  Laboratory tests ordered, patient in stable condition, and comfortable pending transfer. Final Clinical Impression(s) / ED Diagnoses Final diagnoses:  AKI (acute kidney injury) [facility])    Rx / [place] Orders ED Discharge Orders     None        Dorien Chihuahua 05/22/21 2128    Hayden Rasmussen, MD 05/23/21 1013

## 2021-05-22 NOTE — ED Triage Notes (Signed)
Patient reports to the ER for hx of Ch[place] with a flare and states he called his Gastroenterologist and was told to go to the ER because he was dehydrated and likely needed fluids.

## 2021-05-23 ENCOUNTER — Inpatient Hospital Stay (HOSPITAL_COMMUNITY): Payer: Medicare Other

## 2021-05-23 DIAGNOSIS — K50818 Crohn's disease of both small and large intestine with other complication: Secondary | ICD-10-CM

## 2021-05-23 DIAGNOSIS — N179 Acute kidney failure, unspecified: Secondary | ICD-10-CM | POA: Diagnosis not present

## 2021-05-23 DIAGNOSIS — A0839 Other viral enteritis: Secondary | ICD-10-CM

## 2021-05-23 DIAGNOSIS — U071 COVID-19: Secondary | ICD-10-CM | POA: Diagnosis not present

## 2021-05-23 LAB — VITAMIN B12: Vitamin B-12: 101 pg/mL — ABNORMAL LOW (ref 180–914)

## 2021-05-23 LAB — RETICULOCYTES
Immature Retic Fract: 5.2 % (ref 2.3–15.9)
RBC.: 3.45 MIL/uL — ABNORMAL LOW (ref 4.22–5.81)
Retic Count, Absolute: 17.9 10*3/uL — ABNORMAL LOW (ref 19.0–186.0)
Retic Ct Pct: 0.5 % (ref 0.4–3.1)

## 2021-05-23 LAB — COMPREHENSIVE METABOLIC PANEL
ALT: 14 U/L (ref 0–44)
ALT: 13 U/L (ref 0–44)
AST: 22 U/L (ref 15–41)
AST: 18 U/L (ref 15–41)
Albumin: 2.8 g/dL — ABNORMAL LOW (ref 3.5–5.0)
Albumin: 2.5 g/dL — ABNORMAL LOW (ref 3.5–5.0)
Alkaline Phosphatase: 61 U/L (ref 38–126)
Alkaline Phosphatase: 53 U/L (ref 38–126)
Anion gap: 10 (ref 5–15)
Anion gap: 11 (ref 5–15)
BUN: 20 mg/dL (ref 6–20)
BUN: 21 mg/dL — ABNORMAL HIGH (ref 6–20)
CO2: 21 mmol/L — ABNORMAL LOW (ref 22–32)
CO2: 20 mmol/L — ABNORMAL LOW (ref 22–32)
Calcium: 8.1 mg/dL — ABNORMAL LOW (ref 8.9–10.3)
Calcium: 7.8 mg/dL — ABNORMAL LOW (ref 8.9–10.3)
Chloride: 104 mmol/L (ref 98–111)
Chloride: 105 mmol/L (ref 98–111)
Creatinine, Ser: 2.01 mg/dL — ABNORMAL HIGH (ref 0.61–1.24)
Creatinine, Ser: 2 mg/dL — ABNORMAL HIGH (ref 0.61–1.24)
GFR, Estimated: 37 mL/min — ABNORMAL LOW (ref >60)
GFR, Estimated: 38 mL/min — ABNORMAL LOW (ref >60)
Glucose, Bld: 175 mg/dL — ABNORMAL HIGH (ref 70–99)
Glucose, Bld: 142 mg/dL — ABNORMAL HIGH (ref 70–99)
Potassium: 2.7 mmol/L — CL (ref 3.5–5.1)
Potassium: 3.1 mmol/L — ABNORMAL LOW (ref 3.5–5.1)
Sodium: 135 mmol/L (ref 135–145)
Sodium: 136 mmol/L (ref 135–145)
Total Bilirubin: 0.9 mg/dL (ref 0.3–1.2)
Total Bilirubin: 0.8 mg/dL (ref 0.3–1.2)
Total Protein: 5.7 g/dL — ABNORMAL LOW (ref 6.5–8.1)
Total Protein: 5.1 g/dL — ABNORMAL LOW (ref 6.5–8.1)

## 2021-05-23 LAB — CBC
HCT: 37.8 % — ABNORMAL LOW (ref 39.0–52.0)
Hemoglobin: 13.2 g/dL (ref 13.0–17.0)
MCH: 34.8 pg — ABNORMAL HIGH (ref 26.0–34.0)
MCHC: 34.9 g/dL (ref 30.0–36.0)
MCV: 99.7 fL (ref 80.0–100.0)
Platelets: 139 10*3/uL — ABNORMAL LOW (ref 150–400)
RBC: 3.79 MIL/uL — ABNORMAL LOW (ref 4.22–5.81)
RDW: 15.9 % — ABNORMAL HIGH (ref 11.5–15.5)
WBC: 3.3 10*3/uL — ABNORMAL LOW (ref 4.0–10.5)
nRBC: 0 % (ref 0.0–0.2)

## 2021-05-23 LAB — HIV ANTIBODY (ROUTINE TESTING W REFLEX): HIV Screen 4th Generation wRfx: NONREACTIVE

## 2021-05-23 LAB — IRON AND TIBC
Iron: 37 ug/dL — ABNORMAL LOW (ref 45–182)
Saturation Ratios: 15 % — ABNORMAL LOW (ref 17.9–39.5)
TIBC: 246 ug/dL — ABNORMAL LOW (ref 250–450)
UIBC: 209 ug/dL

## 2021-05-23 LAB — CBC WITH DIFFERENTIAL/PLATELET
Abs Immature Granulocytes: 0.02 10*3/uL (ref 0.00–0.07)
Basophils Absolute: 0 10*3/uL (ref 0.0–0.1)
Basophils Relative: 0 %
Eosinophils Absolute: 0 10*3/uL (ref 0.0–0.5)
Eosinophils Relative: 0 %
HCT: 35.5 % — ABNORMAL LOW (ref 39.0–52.0)
Hemoglobin: 12.3 g/dL — ABNORMAL LOW (ref 13.0–17.0)
Immature Granulocytes: 1 %
Lymphocytes Relative: 31 %
Lymphs Abs: 0.6 10*3/uL — ABNORMAL LOW (ref 0.7–4.0)
MCH: 34.9 pg — ABNORMAL HIGH (ref 26.0–34.0)
MCHC: 34.6 g/dL (ref 30.0–36.0)
MCV: 100.9 fL — ABNORMAL HIGH (ref 80.0–100.0)
Monocytes Absolute: 0.1 10*3/uL (ref 0.1–1.0)
Monocytes Relative: 7 %
Neutro Abs: 1.2 10*3/uL — ABNORMAL LOW (ref 1.7–7.7)
Neutrophils Relative %: 61 %
Platelets: 136 10*3/uL — ABNORMAL LOW (ref 150–400)
RBC: 3.52 MIL/uL — ABNORMAL LOW (ref 4.22–5.81)
RDW: 16 % — ABNORMAL HIGH (ref 11.5–15.5)
WBC: 1.9 10*3/uL — ABNORMAL LOW (ref 4.0–10.5)
nRBC: 0 % (ref 0.0–0.2)

## 2021-05-23 LAB — C-REACTIVE PROTEIN
CRP: 0.5 mg/dL (ref <1.0)
CRP: 0.6 mg/dL (ref <1.0)

## 2021-05-23 LAB — D-DIMER, QUANTITATIVE: D-Dimer, Quant: 1.99 ug/mL-FEU — ABNORMAL HIGH (ref 0.00–0.50)

## 2021-05-23 LAB — FERRITIN: Ferritin: 136 ng/mL (ref 24–336)

## 2021-05-23 LAB — FOLATE: Folate: 14.9 ng/mL (ref >5.9)

## 2021-05-23 MED ORDER — POTASSIUM CHLORIDE CRYS ER 20 MEQ PO TBCR
40.0000 meq | EXTENDED_RELEASE_TABLET | Freq: Once | ORAL | Status: AC
  Administered 2021-05-23: 40 meq via ORAL
  Filled 2021-05-23: qty 2

## 2021-05-23 MED ORDER — SODIUM CHLORIDE 0.9 % IV SOLN
200.0000 mg | Freq: Once | INTRAVENOUS | Status: AC
  Administered 2021-05-23: 200 mg via INTRAVENOUS
  Filled 2021-05-23: qty 40

## 2021-05-23 MED ORDER — ADULT MULTIVITAMIN W/MINERALS CH
1.0000 | ORAL_TABLET | Freq: Every day | ORAL | Status: DC
  Administered 2021-05-23 – 2021-05-25 (×3): 1 via ORAL
  Filled 2021-05-23 (×3): qty 1

## 2021-05-23 MED ORDER — SODIUM CHLORIDE 0.9 % IV SOLN
100.0000 mg | Freq: Every day | INTRAVENOUS | Status: DC
  Administered 2021-05-25: 100 mg via INTRAVENOUS
  Filled 2021-05-23 (×2): qty 20

## 2021-05-23 MED ORDER — POTASSIUM CHLORIDE 10 MEQ/100ML IV SOLN
10.0000 meq | INTRAVENOUS | Status: DC
  Administered 2021-05-23 (×3): 10 meq via INTRAVENOUS
  Filled 2021-05-23 (×5): qty 100

## 2021-05-23 MED ORDER — BOOST / RESOURCE BREEZE PO LIQD CUSTOM
1.0000 | Freq: Three times a day (TID) | ORAL | Status: DC
Start: 2021-05-23 — End: 2021-05-25
  Administered 2021-05-23 – 2021-05-25 (×6): 1 via ORAL

## 2021-05-23 MED ORDER — LOPERAMIDE HCL 2 MG PO CAPS
2.0000 mg | ORAL_CAPSULE | Freq: Four times a day (QID) | ORAL | Status: DC
  Administered 2021-05-23 – 2021-05-25 (×8): 2 mg via ORAL
  Filled 2021-05-23 (×8): qty 1

## 2021-05-23 MED ORDER — ONDANSETRON HCL 4 MG/2ML IJ SOLN
4.0000 mg | Freq: Four times a day (QID) | INTRAMUSCULAR | Status: DC | PRN
  Administered 2021-05-23: 4 mg via INTRAVENOUS
  Filled 2021-05-23: qty 2

## 2021-05-23 MED ORDER — POTASSIUM CHLORIDE CRYS ER 20 MEQ PO TBCR
20.0000 meq | EXTENDED_RELEASE_TABLET | Freq: Every day | ORAL | Status: DC
  Administered 2021-05-23 – 2021-05-25 (×3): 20 meq via ORAL
  Filled 2021-05-23 (×3): qty 1

## 2021-05-23 NOTE — H&P (Addendum)
History and Physical    Gregory [name]. BWG:[id number] DOB: [birth year] DOA: 05/22/2021  PCP: Gregory Ruddy, MD  Patient coming from: Home.  Chief Complaint: Nausea fatigue weakness and increased colostomy output.    HPI: Gregory [name]. is a 60 y.o. male with history of Crohn's disease with multiple surgery presently has colostomy on the Stelara doing well started experiencing increasing fatigue and cough for the last 5 days since last Saturday with increasing output from the colostomy.  Patient was feeling weak and was instructed to come to the ER.  Denies any fever chills abdominal pain or any blood in the colostomy output.  Patient also thought that he may be having the flu.  Patient has been vaccinated against COVID with boosters.  ED Course: In the ER patient was afebrile and blood pressure was in the low normal.  Labs show acute renal failure with creatinine worsening from 1.25 last month it is around 2.13 hypokalemia of 3.3 CBC showing macrocytosis which is chronic.  Patient was given initially prednisone 60 mg and IV fluids with concern for exacerbation of Crohn's disease.  Eventually patient's COVID test came back positive.  Chest x-ray is pending patient is not hypoxic.  Patient admitted for COVID infection with acute renal failure with history of Crohn's.  Review of Systems: As per HPI, rest all negative.   Past Medical History:  Diagnosis Date   Acute endocarditis    Crohn's disease ([place])    Depression    Eating disorder    GERD (gastroesophageal reflux disease)    Kidney stones    Short gut syndrome     Past Surgical History:  Procedure Laterality Date   COLONOSCOPY  06/11/2020   COLONOSCOPY  07/05/2018   COLONOSCOPY WITH PROPOFOL N/A 09/17/2017   Procedure: COLONOSCOPY WITH PROPOFOL;  Surgeon: [name], MD;  [facility];  Service: Endoscopy;  Laterality: N/A;  Through ileostomy.   CYSTOSCOPY WITH STENT PLACEMENT Left 05/20/2017    Procedure: CYSTOSCOPY WITH STENT PLACEMENT;  Surgeon: [name], MD;  Location[facility];  Service: Urology;  Laterality: Left;   ILEOSTOMY     IR NEPHROSTOMY EXCHANGE LEFT  04/12/2017   IR NEPHROSTOMY PLACEMENT LEFT  04/12/2017   IR NEPHROSTOMY PLACEMENT LEFT  04/29/2017   NEPHROLITHOTOMY Left 05/14/2017   Procedure: LEFT NEPHROLITHOTOMY PERCUTANEOUS WITH SURGEON ACCESS;  Surgeon: [name], MD;  Location[facility];  Service: Urology;  Laterality: Left;   SMALL INTESTINE SURGERY     SUBTOTAL COLECTOMY       reports that he quit smoking about 14 years ago. His smoking use included cigarettes. He has a 20.00 pack-year smoking history. He has never used smokeless tobacco. He reports that he does not drink alcohol and does not use drugs.  Allergies  Allergen Reactions   Methotrexate Derivatives Other (See Comments)    Anemia, low WBC, severe GI symptoms,    Phenergan [Promethazine Hcl] Other (See Comments)    twitching   Humira [Adalimumab] Other (See Comments)    Intolerance   Penicillins Other (See Comments)    REACTION DURING SURGERY Has patient had a PCN reaction causing immediate rash, facial/tongue/throat swelling, SOB or lightheadedness with hypotension: Unknown Has patient had a PCN reaction causing severe rash involving mucus membranes or skin necrosis: Unknown Has patient had a PCN reaction that required hospitalization: Unknown Has patient had a PCN reaction occurring within the last 10 years: No If all of the above answers  are "NO", then may proceed with Cephalosporin use.    Remicade [Infliximab] Other (See Comments)    Intolerance    Family History  Problem Relation Age of Onset   Heart disease Mother    Diabetes Father    Heart disease Father    Heart attack Father    Melanoma Sister    Crohn's disease Brother    Inflammatory bowel disease Brother    Diabetes Paternal Grandmother    Colon cancer Neg Hx    Rectal cancer Neg Hx    Stomach cancer Neg  Hx    Esophageal cancer Neg Hx     Prior to Admission medications   Medication Sig Start Date End Date Taking? Authorizing Provider  acetaminophen (TYLENOL) 500 MG tablet Take 500 mg by mouth every 6 (six) hours as needed for moderate pain.    [provider]  diphenoxylate-atropine (LOMOTIL) 2.5-0.025 MG tablet TAKE 1 TABLET 4 TIMES A DAY AS NEEDED FOR DIARRHEA OR LOOSE STOOLS 05/20/21   Armbruster, Carlota Raspberry, MD  Multiple Vitamin (MULTIVITAMIN WITH MINERALS) TABS tablet Take 1 tablet by mouth daily. 05/03/17   Rai, Vernelle Emerald, MD  omeprazole (PRILOSEC) 20 MG capsule Take 1 capsule (20 mg total) by mouth daily. 12/17/20   Armbruster, Carlota Raspberry, MD  ondansetron (ZOFRAN) 4 MG tablet TAKE 1 TABLET EVERY 8 HOURS AS NEEDED FOR NASUEA OR VOMITING 05/20/21   Armbruster, Carlota Raspberry, MD  potassium chloride (KLOR-CON) 20 MEQ packet Take 20 mEq by mouth daily. 12/17/20   Armbruster, Carlota Raspberry, MD  sertraline (ZOLOFT) 50 MG tablet Take 1 tablet (50 mg total) by mouth daily. Patient taking differently: Take 50 mg by mouth daily as needed (down spells?). 08/26/17   Gregory Ruddy, MD  traMADol (ULTRAM) 50 MG tablet TAKE ONE TABLET EVERY 8 HOURS AS NEEDED FOR PAIN 04/11/21   Armbruster, Carlota Raspberry, MD  ustekinumab (STELARA) 90 MG/ML SOSY injection Inject 1 mL (90 mg total) into the skin every 28 (twenty-eight) days. 05/14/21   Armbruster, Carlota Raspberry, MD  Vitamin D, Ergocalciferol, 2000 units CAPS Take 2 capsules by mouth daily.    [provider]    Physical Exam: Constitutional: Moderately built and nourished. Vitals:   05/22/21 1900 05/22/21 2000 05/22/21 2254 05/23/21 0026  BP: 106/76 110/70 97/73   Pulse: 72 61 (!) 59   Resp: 16 16 18    Temp:  98 F (36.7 C) 98.1 F (36.7 C)   TempSrc:   Oral   SpO2: 100% 100% 99%   Weight:    54.1 kg  Height:    5' 11"  (1.803 m)   Eyes: Anicteric no pallor. ENMT: No discharge from the ears eyes nose and mouth. Neck: No mass felt.  No neck  rigidity. Respiratory: No rhonchi or crepitations. Cardiovascular: S1-S2 heard. Abdomen: Soft nontender bowel sound present. Musculoskeletal: No edema. Skin: No rash. Neurologic: Alert awake oriented to time place and person.  Moves all extremities. Psychiatric: Appears normal.  Normal affect.   Labs on Admission: I have personally reviewed following labs and imaging studies  CBC: Recent Labs  Lab 05/22/21 1633 05/23/21 0103  WBC 5.4 3.3*  HGB 15.4 13.2  HCT 45.6 37.8*  MCV 100.9* 99.7  PLT 170 299*   Basic Metabolic Panel: Recent Labs  Lab 05/22/21 1633 05/23/21 0103  NA 134* 135  K 3.3* 2.7*  CL 100 104  CO2 20* 21*  GLUCOSE 112* 175*  BUN 20 20  CREATININE 2.13* 2.01*  CALCIUM 8.9 8.1*   GFR: Estimated Creatinine Clearance: 29.9 mL/min (A) (by C-G formula based on SCr of 2.01 mg/dL (H)). Liver Function Tests: Recent Labs  Lab 05/22/21 1633 05/23/21 0103  AST 23 22  ALT 10 14  ALKPHOS 68 61  BILITOT 0.9 0.9  PROT 7.2 5.7*  ALBUMIN 4.1 2.8*   Recent Labs  Lab 05/22/21 1633  LIPASE 80*   No results for input(s): AMMONIA in the last 168 hours. Coagulation Profile: No results for input(s): INR, PROTIME in the last 168 hours. Cardiac Enzymes: No results for input(s): CKTOTAL, CKMB, CKMBINDEX, TROPONINI in the last 168 hours. BNP (last 3 results) No results for input(s): PROBNP in the last 8760 hours. HbA1C: No results for input(s): HGBA1C in the last 72 hours. CBG: No results for input(s): GLUCAP in the last 168 hours. Lipid Profile: No results for input(s): CHOL, HDL, LDLCALC, TRIG, CHOLHDL, LDLDIRECT in the last 72 hours. Thyroid Function Tests: No results for input(s): TSH, T4TOTAL, FREET4, T3FREE, THYROIDAB in the last 72 hours. Anemia Panel: No results for input(s): VITAMINB12, FOLATE, FERRITIN, TIBC, IRON, RETICCTPCT in the last 72 hours. Urine analysis:    Component Value Date/Time   COLORURINE YELLOW 12/19/2019 1810   APPEARANCEUR  CLEAR 12/19/2019 1810   LABSPEC 1.008 12/19/2019 1810   PHURINE 6.0 12/19/2019 1810   GLUCOSEU NEGATIVE 12/19/2019 1810   GLUCOSEU NEGATIVE 10/20/2017 1418   [place] 12/19/2019 1810   BILIRUBINUR NEGATIVE 12/19/2019 1810   KETONESUR NEGATIVE 12/19/2019 1810   PROTEINUR NEGATIVE 12/19/2019 1810   UROBILINOGEN 0.2 10/20/2017 1418   NITRITE NEGATIVE 12/19/2019 1810   LEUKOCYTESUR NEGATIVE 12/19/2019 1810   Sepsis Labs: @LABRCNTIP (procalcitonin:4,lacticidven:4) ) Recent Results (from the past 240 hour(s))  Resp Panel by RT-PCR (Flu A&B, Covid) Nasopharyngeal Swab     Status: Abnormal   Collection Time: 05/22/21  8:56 PM   Specimen: Nasopharyngeal Swab; Nasopharyngeal(NP) swabs in vial transport medium  Result Value Ref Range Status   SARS Coronavirus 2 by RT PCR POSITIVE (A) NEGATIVE Final    Comment: RESULT CALLED TO, READ BACK BY AND VERIFIED WITH: CINDY REED RN AT 2156 05/22/21 CH (NOTE) SARS-CoV-2 target nucleic acids are DETECTED.  The SARS-CoV-2 RNA is generally detectable in upper respiratory specimens during the acute phase of infection. Positive results are indicative of the presence of the identified virus, but do not rule out bacterial infection or co-infection with other pathogens not detected by the test. Clinical correlation with patient history and other diagnostic information is necessary to determine patient infection status. The expected result is Negative.  Fact Sheet for Patients: EntrepreneurPulse.com.au  Fact Sheet for Healthcare Providers: IncredibleEmployment.be  This test is not yet approved or cleared by the [place] FDA and  has been authorized for detection and/or diagnosis of SARS-CoV-2 by FDA under an Emergency Use Authorization (EUA).  This EUA will remain in effect (meaning this test can be  used) for the duration of  the COVID-19 declaration under Section 564(b)(1) of the Act, 21 U.S.C. section  360bbb-3(b)(1), unless the authorization is terminated or revoked sooner.     Influenza A by PCR NEGATIVE NEGATIVE Final   Influenza B by PCR NEGATIVE NEGATIVE Final    Comment: (NOTE) The Xpert Xpress SARS-CoV-2/FLU/RSV plus assay is intended as an aid in the diagnosis of influenza from Nasopharyngeal swab specimens and should not be used as a sole basis for treatment. Nasal washings and aspirates are unacceptable for Xpert Xpress SARS-CoV-2/FLU/RSV testing.  Fact Sheet for Patients:  EntrepreneurPulse.com.au  Fact Sheet for Healthcare Providers: IncredibleEmployment.be  This test is not yet approved or cleared by the [place] FDA and has been authorized for detection and/or diagnosis of SARS-CoV-2 by FDA under an Emergency Use Authorization (EUA). This EUA will remain in effect (meaning this test can be used) for the duration of the COVID-19 declaration under Section 564(b)(1) of the Act, 21 U.S.C. section 360bbb-3(b)(1), unless the authorization is terminated or revoked.  Performed at [facility], [address] [place], [place] [address]      Radiological Exams on Admission: No results found.   Assessment/Plan Principal Problem:   COVID-19 virus infection Active Problems:   AKI (acute kidney injury) ([place])   Crohn's disease ([place])    COVID-19 infection -patient does have symptoms of COVID-19 infection with cough and fatigue and weakness and generalized body aches.  Patient is not hypoxic at this time.  Patient's increased output from the colostomy may be secondary to COVID.  At this time patient is agreeable to getting remdesivir.  Since patient is not hypoxic will not start steroids for COVID.  Chest x-ray is pending.  Inflammatory markers are pending. Acute renal failure with hyperkalemia likely from increased output from colostomy could be from COVID infection but will get opinion from gastroenterologist.   At this time we will gently hydrate follow metabolic panel intake output and also replace potassium.  Check magnesium with next blood draw. Chron's disease with colostomy with increasing output which may be secondary to Crohn's exacerbation or secondary to COVID infection.  Patient did receive 1 dose of prednisone 60 mg in the ER.  Will await further recommendations from gastroenterologist to see if patient requires further dose of steroids.  Follow inflammatory markers. Macrocytosis check anemia panel with next blood draw.  Since patient has COVID infection with acute renal failure increasing colostomy output will need close monitoring for any further worsening and inpatient status.   DVT prophylaxis: Lovenox. Code Status: Full code. Family Communication: Discussed with patient. Disposition Plan: Home. Consults called: We will need to consult gastroenterologist. Admission status: Inpatient.   Rise Patience MD [facility] Pager [phone].  If 7PM-7AM, please contact night-coverage [email or web address] Password [facility]  05/23/2021, 3:34 AM

## 2021-05-23 NOTE — Progress Notes (Signed)
Initial Nutrition Assessment  DOCUMENTATION CODES:  Underweight  INTERVENTION:  Continue regular diet.  Add Boost Breeze po TID, each supplement provides 250 kcal and 9 grams of protein.  Add MVI with minerals daily.  NUTRITION DIAGNOSIS:  Increased nutrient needs related to acute illness (COVID-19 infection) as evidenced by estimated needs.  GOAL:  Patient will meet greater than or equal to 90% of their needs  MONITOR:  PO intake, Supplement acceptance, Labs, Weight trends, I & O's  REASON FOR ASSESSMENT:  Malnutrition Screening Tool    ASSESSMENT:  60 yo male with a PMH of Crohn's disease with multiple bowel surgeries with colostomy presenting with 5 days history of fatigue, cough, weakness, and increased ostomy output. Admitted with COVID-19 infection.  RD working remotely. Attempted to call patient's room phone. Pt answered but stated it was not a good time to talk.  GI ruled out Crohn's flare and thinks nausea/vomiting/diarrhea is likely related to COVID-19 infection.   Per MD note, patient reports feeling significantly better today, ate some breakfast.  Per Epic, pt ate 100% of breakfast this morning.  Per Epic, pt has lost ~6 lbs (4.6%) in the past 5 months, which is not necessarily significant for the time frame.  Suspect pt is severely malnourished, but cannot definitively diagnose at this time.  Recommend continuing regular diet and adding MVI with minerals, as well as Boost Breeze TID to promote intake.  Medications: reviewed; Klor-Con 20 mEq, Imodium QID, LR @ 100 ml/hr  Labs: reviewed; K 3.1 (L), Glucose 142 (H)  NUTRITION - FOCUSED PHYSICAL EXAM: Unable to perform - defer to in-person assessment  Diet Order:   Diet Order             Diet regular Room service appropriate? Yes; Fluid consistency: Thin  Diet effective now                  EDUCATION NEEDS:  No education needs have been identified at this time  Skin:  Skin Assessment: Reviewed  RN Assessment  Last BM:  05/22/21 - Ostomy  Height:  Ht Readings from Last 1 Encounters:  05/23/21 5' 11"  (1.803 m)   Weight:  Wt Readings from Last 1 Encounters:  05/23/21 54.1 kg   BMI:  Body mass index is 16.63 kg/m.  Estimated Nutritional Needs:  Kcal:  2100-2300 Protein:  75-90 grams Fluid:  >2.1 L  Derrel Nip, RD, LDN (she/her/hers) Registered Dietitian I After-Hours/Weekend Pager # in [place]

## 2021-05-23 NOTE — Progress Notes (Signed)
New Admission Note:   Arrival Method: [facility] from [facility] via stretrecher Mental Orientation: alert and orientedx4 Telemetry: N/A Assessment: Completed Skin: Intact IV: RAC, saline locked Pain: 0/10 Tubes: None Safety Measures: Safety Fall Prevention Plan has been discussed  Admission: completed  5 [place] Orientation: Patient has been oriented to the room, unit and staff.   Family: none at bedside  Orders to be reviewed and implemented. Will continue to monitor the patient. Call light has been placed within reach and bed alarm has been activated.

## 2021-05-23 NOTE — Consult Note (Addendum)
Consultation  Referring Provider: [facility]/Gregory [name] Gregory [name] Primary Care Physician:  Billie Ruddy, MD Primary Gastroenterologist:  Dr. Havery Moros  Reason for Consultation: Crohn'[name] disease, acute nausea vomiting diarrhea rule out exacerbation  HPI: Gregory [name]. is a 60 y.o. male, known to Dr. Havery Moros with history of Crohn'[name] ileocolitis.  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  He has been maintained on Stelara with good control of symptoms and is currently on monthly dosing.  Last colonoscopy was done October 2021 which showed shallow ulcerations at the anastomosis and the distal 10 cm of the ileum had scattered small ulcerations. Patient became acutely ill this past weekend with fever chills sweats, fatigue, cough, body aches as well as nausea vomiting and diarrhea.  He says he felt extremely weak and stayed in bed.  He was unable to eat for couple of days but was trying to hydrate himself. He was having liquid/watery stool and was emptying his bag once or twice daily.  He says as he was not eating or drinking much he was not passing that much through the ostomy. His normal is to have semiformed stool and usually has to empty the bag about 5 times per day.  He also generally takes 4 Imodium tablets daily. A presented to the emergency room yesterday, COVID-19 returned positive, and has been started on remdesivir. He did receive 1 dose of steroids in the emergency room. 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   Today'[name] labs [facility] 3.3/hemoglobin 13.2/hematocrit 37.8 Potassium 2.7/BUN 20/creatinine 2.07  He is already feeling significantly better, he says his nausea has resolved and even  though he really was not hungry he ate an entire solid meal.  No complaints of abdominal pain or cramping, cough has improved, still feeling weak but has not had any fever or chills.   Past Medical History:  Diagnosis Date  . Acute endocarditis   . Crohn'[name] disease ([place])   . Depression   . Eating disorder   . GERD (gastroesophageal reflux disease)   . Kidney stones   . Short gut syndrome     Past Surgical History:  Procedure Laterality Date  . COLONOSCOPY  06/11/2020  . COLONOSCOPY  07/05/2018  . COLONOSCOPY WITH PROPOFOL N/A 09/17/2017   Procedure: COLONOSCOPY WITH PROPOFOL;  Surgeon: [name], MD;  Location: [facility];  Service: Endoscopy;  Laterality: N/A;  Through ileostomy.  [name] WITH STENT PLACEMENT Left 05/20/2017   Procedure: CYSTOSCOPY WITH STENT PLACEMENT;  Surgeon: [name], MD;  Location: [facility];  Service: Urology;  Laterality: Left;  . ILEOSTOMY    . IR NEPHROSTOMY EXCHANGE LEFT  04/12/2017  . IR NEPHROSTOMY PLACEMENT LEFT  04/12/2017  . IR NEPHROSTOMY PLACEMENT LEFT  04/29/2017  . NEPHROLITHOTOMY Left 05/14/2017   Procedure: LEFT NEPHROLITHOTOMY PERCUTANEOUS WITH SURGEON ACCESS;  Surgeon: [name], MD;  Location: [facility];  Service: Urology;  Laterality: Left;  . SMALL INTESTINE SURGERY    . SUBTOTAL COLECTOMY      Prior to Admission medications   Medication Sig Start Date End Date Taking? Authorizing Provider  acetaminophen (TYLENOL) 500 MG tablet Take 500 mg by mouth every 6 (six) hours as needed for moderate pain.  [provider]  diphenoxylate-atropine (LOMOTIL) 2.5-0.025 MG tablet TAKE 1 TABLET 4 TIMES A DAY AS NEEDED FOR DIARRHEA OR LOOSE STOOLS Patient taking differently: Take 1 tablet by mouth 4 (four) times daily as needed for diarrhea or loose stools. 05/20/21   Armbruster, Carlota Raspberry, MD  famotidine (PEPCID) 20 MG tablet Take 20 mg by mouth daily. 02/12/21   [provider]  Multiple Vitamin  (MULTIVITAMIN WITH MINERALS) TABS tablet Take 1 tablet by mouth daily. 05/03/17   Rai, Vernelle Emerald, MD  omeprazole (PRILOSEC) 20 MG capsule Take 1 capsule (20 mg total) by mouth daily. 12/17/20   Armbruster, Carlota Raspberry, MD  ondansetron (ZOFRAN) 4 MG tablet TAKE 1 TABLET EVERY 8 HOURS AS NEEDED FOR NASUEA OR VOMITING Patient taking differently: Take 4 mg by mouth every 8 (eight) hours as needed for nausea or vomiting. 05/20/21   Armbruster, Carlota Raspberry, MD  potassium chloride (KLOR-CON) 20 MEQ packet Take 20 mEq by mouth daily. 12/17/20   Armbruster, Carlota Raspberry, MD  sertraline (ZOLOFT) 50 MG tablet Take 1 tablet (50 mg total) by mouth daily. Patient taking differently: Take 50 mg by mouth daily as needed (down spells?). 08/26/17   Billie Ruddy, MD  traMADol (ULTRAM) 50 MG tablet TAKE ONE TABLET EVERY 8 HOURS AS NEEDED FOR PAIN Patient taking differently: Take 50 mg by mouth every 8 (eight) hours as needed for moderate pain. 04/11/21   Armbruster, Carlota Raspberry, MD  ustekinumab (STELARA) 90 MG/ML SOSY injection Inject 1 mL (90 mg total) into the skin every 28 (twenty-eight) days. 05/14/21   Armbruster, Carlota Raspberry, MD  Vitamin D, Ergocalciferol, 2000 units CAPS Take 2 capsules by mouth daily.    [provider]    Current Facility-Administered Medications  Medication Dose Route Frequency Provider Last Rate Last Admin  . enoxaparin (LOVENOX) injection 30 mg  30 mg Subcutaneous Q24H Gregory Patience, MD   30 mg at 05/23/21 0917  . lactated ringers infusion   Intravenous Continuous Gregory Patience, MD 100 mL/hr at 05/23/21 0919 New Bag at 05/23/21 0919  . loperamide (IMODIUM) capsule 2 mg  2 mg Oral Q6H Gregory [name], Gregory S, PA-C      . potassium chloride SA (KLOR-CON) CR tablet 20 mEq  20 mEq Oral Daily Gregory [name], Ramesh, MD      . potassium chloride SA (KLOR-CON) CR tablet 40 mEq  40 mEq Oral Once Gregory [name], Gregory Beach, MD      . Derrill Memo ON 05/24/2021] remdesivir 100 mg in sodium chloride 0.9 % 100 mL IVPB  100 mg  Intravenous Daily Gregory [name], RPH        Allergies as of 05/22/2021 - Review Complete 05/22/2021  Allergen Reaction Noted  . Methotrexate derivatives Other (See Comments) 04/22/2017  . Phenergan [promethazine hcl] Other (See Comments) 01/18/2018  . Humira [adalimumab] Other (See Comments) 02/23/2017  . Penicillins Other (See Comments) 02/23/2017  . Remicade [infliximab] Other (See Comments) 02/23/2017    Family History  Problem Relation Age of Onset  . Heart disease Mother   . Diabetes Father   . Heart disease Father   . Heart attack Father   . Melanoma Sister   . Crohn'[name] disease Brother   . Inflammatory bowel disease Brother   . Diabetes Paternal Grandmother   . Colon cancer Neg Hx   . Rectal cancer Neg Hx   . Stomach cancer Neg Hx   . Esophageal cancer Neg Hx     Social History   Socioeconomic  History  . Marital status: Divorced    Spouse name: Not on file  . Number of children: 2  . Years of education: Not on file  . Highest education level: Not on file  Occupational History  . Occupation: Retired  Tobacco Use  . Smoking status: Former    Packs/day: 1.00    Years: 20.00    Pack years: 20.00    Types: Cigarettes    Quit date: 08/31/2006    Years since quitting: 14.7  . Smokeless tobacco: Never  Vaping Use  . Vaping Use: Never used  Substance and Sexual Activity  . Alcohol use: No  . Drug use: No  . Sexual activity: Not Currently  Other Topics Concern  . Not on file  Social History Narrative  . Not on file   Social Determinants of Health   Financial Resource Strain: Medium Risk  . Difficulty of Paying Living Expenses: Somewhat hard  Food Insecurity: No Food Insecurity  . Worried About Charity fundraiser in the Last Year: Never true  . Ran Out of Food in the Last Year: Never true  Transportation Needs: No Transportation Needs  . Lack of Transportation (Medical): No  . Lack of Transportation (Non-Medical): No  Physical Activity: Inactive  .  Days of Exercise per Week: 0 days  . Minutes of Exercise per Session: 0 min  Stress: No Stress Concern Present  . Feeling of Stress : Not at all  Social Connections: Moderately Isolated  . Frequency of Communication with Friends and Family: More than three times a week  . Frequency of Social Gatherings with Friends and Family: Never  . Attends Religious Services: 1 to 4 times per year  . Active Member of Clubs or Organizations: No  . Attends Archivist Meetings: Never  . Marital Status: Divorced  Human resources officer Violence: Not At Risk  . Fear of Current or Ex-Partner: No  . Emotionally Abused: No  . Physically Abused: No  . Sexually Abused: No    Review of Systems: Pertinent positive and negative review of systems were noted in the above HPI section.  All other review of systems was otherwise negative.   Physical Exam: Vital signs in last 24 hours: Temp:  [97.6 F (36.4 C)-98.1 F (36.7 C)] 97.6 F (36.4 C) (09/23 0640) Pulse Rate:  [51-88] 51 (09/23 0640) Resp:  [16-19] 18 (09/23 0640) BP: (92-111)/(64-85) 104/67 (09/23 0640) SpO2:  [97 %-100 %] 99 % (09/23 0640) Weight:  [54.1 kg-56.7 kg] 54.1 kg (09/23 0026) Last BM Date: 05/22/21 General:   Alert,  Well-developed, thin older white male pleasant and cooperative in NAD Head:  Normocephalic and atraumatic. Eyes:  Sclera clear, no icterus.   Conjunctiva pink. Ears:  Normal auditory acuity. Nose:  No deformity, discharge,  or lesions. Mouth:  No deformity or lesions.   Neck:  Supple; no masses or thyromegaly. Lungs:  Clear throughout to auscultation.   No wheezes, crackles, or rhonchi.  Heart:  Regular rate and rhythm; no murmurs, clicks, rubs,  or gallops. Abdomen:  Soft,nontender, BS active,/hyperactive nonpalp mass or hsm.  Ostomy in the left lower quadrant Rectal: Not done Msk:  Symmetrical without gross deformities. . Pulses:  Normal pulses noted. Extremities:  Without clubbing or edema. Neurologic:  Alert  and  oriented x4;  grossly normal neurologically. Skin:  Intact without significant lesions or rashes.. Psych:  Alert and cooperative. Normal mood and affect.  Intake/Output from previous day: 09/22 0701 - 09/23 0700 In:  1233.8 [P.O.:400; I.V.:633.8; IV Piggyback:200] Out: 300 [Urine:300] Intake/Output this shift: No intake/output data recorded.  Lab Results: Recent Labs    05/22/21 1633 05/23/21 0103 05/23/21 0706  WBC 5.4 3.3* 1.9*  HGB 15.4 13.2 12.3*  HCT 45.6 37.8* 35.5*  PLT 170 139* 136*   BMET Recent Labs    05/22/21 1633 05/23/21 0103 05/23/21 0706  NA 134* 135 136  K 3.3* 2.7* 3.1*  CL 100 104 105  CO2 20* 21* 20*  GLUCOSE 112* 175* 142*  BUN 20 20 21*  CREATININE 2.13* 2.01* 2.00*  CALCIUM 8.9 8.1* 7.8*   LFT Recent Labs    05/23/21 0706  PROT 5.1*  ALBUMIN 2.5*  AST 18  ALT 13  ALKPHOS 53  BILITOT 0.8   PT/INR No results for input([name]): LABPROT, INR in the last 72 hours. Hepatitis Panel No results for input([name]): HEPBSAG, HCVAB, HEPAIGM, HEPBIGM in the last 72 hours.    IMPRESSION:  #104 59 year old white male with Crohn'[name] ileocolitis status post several small bowel resections and subtotal colectomy with colostomy, maintained on Stelara with good control of symptoms.  Last colonoscopy October 2021 did show mildly active disease #2 acute illness onset 6 days ago with fever chills fatigue malaise body aches cough, nausea vomiting and diarrhea and has been diagnosed with COVID-19 as of admission last p.m. I think all of his GI symptoms including the diarrhea are secondary to acute COVID-19 infection. #3 mild iron deficiency #4 COPD #5 acute kidney injury secondary to COVID-19 infection #6 hypokalemia secondary to above  Plan regular low roughage diet Continue volume replacement and electrolyte replacement Continue remdesivir Restart his maintenance dose of Imodium 1 p.o. 4 times daily Expect that his diarrhea will improve as COVID-19 infection  resolves.  Would not start on steroids at this point Crohn'[name] standpoint.  GI will see again in a.m.     Gregory Esterwood  PA-C9/23/2022, 11:12 AM     [facility] Attending   I agree with the Advanced Practitioner'[name] note, impression and recommendations.  Majority the medical decision-making in the formulation of the assessment and plan were performed by me.  Gatha Mayer, MD, [facility] 05/23/2021 3:38 PM

## 2021-05-23 NOTE — Progress Notes (Signed)
Dr. Hal Hope is at bedside with patient. MD ordered Potassium chloride IV x5 .  MD Shalhoub called back. Informed him Dr. Hal Hope had made rounds and put new orders

## 2021-05-23 NOTE — Progress Notes (Signed)
Patient seen and examined personally, I reviewed the chart, history and physical and admission note, done by admitting physician this morning and agree with the same with following addendum.  Please refer to the morning admission note for more detailed plan of care.  Briefly,   60 year old male with Crohn's disease with multiple surgeries with colostomy presenting with 5 days history of fatigue cough weakness and increased ostomy output.  Patient had no abdominal pain chills or any blood in the colostomy. He was seen in the ED blood pressure low normal labs showed AKI baseline creatinine 1.20-monthago and in the ED 2.1, with hypokalemia and found to have increased ostomy output given prednisone 60 mg x 1 and IV fluids for concern of exacerbation of Crohn's disease but Had COVID-positive although not hypoxic.  Patient was admitted for further management.   On my exam is alert awake oriented x3.  He reports he ate his meal. Normally  he says he get 3-5 bowel movement in the colostomy but not "this bad" Denies any nausea vomiting abdominal pain chest pain shortness of breath  Issues  COVID-19 positive symptomatic with cough/weakness: Mildly symptomatic but not hypoxic AKI Hypokalemia Crohn's disease with colostomy with increased output-?  Exacerbation Vitamin B12 deficiency with Macrocytosis  Plan Continue on IV fluid hydration and supportive management  for dehydration, AKI, increased colostomy output and with COVID-positive treating with remdesivir not on a steroid as he is not hypoxic, monitor inflammatory markers CRP is normal, D-dimer slightly up. GI has been consulted 2/2 increased colostomy output. Replete electrolytes. Replete B12

## 2021-05-23 NOTE — Progress Notes (Signed)
Notified by lab for critical value result of potassium 2.76mmol/L. MD on call provider Shalhoub made aware. Awaiting response.

## 2021-05-24 DIAGNOSIS — K50818 Crohn's disease of both small and large intestine with other complication: Secondary | ICD-10-CM | POA: Diagnosis not present

## 2021-05-24 DIAGNOSIS — A0839 Other viral enteritis: Secondary | ICD-10-CM | POA: Diagnosis not present

## 2021-05-24 DIAGNOSIS — U071 COVID-19: Secondary | ICD-10-CM | POA: Diagnosis not present

## 2021-05-24 LAB — CBC WITH DIFFERENTIAL/PLATELET
Abs Immature Granulocytes: 0.06 10*3/uL (ref 0.00–0.07)
Basophils Absolute: 0 10*3/uL (ref 0.0–0.1)
Basophils Relative: 0 %
Eosinophils Absolute: 0 10*3/uL (ref 0.0–0.5)
Eosinophils Relative: 0 %
HCT: 36.4 % — ABNORMAL LOW (ref 39.0–52.0)
Hemoglobin: 12 g/dL — ABNORMAL LOW (ref 13.0–17.0)
Immature Granulocytes: 1 %
Lymphocytes Relative: 43 %
Lymphs Abs: 2.4 10*3/uL (ref 0.7–4.0)
MCH: 34 pg (ref 26.0–34.0)
MCHC: 33 g/dL (ref 30.0–36.0)
MCV: 103.1 fL — ABNORMAL HIGH (ref 80.0–100.0)
Monocytes Absolute: 0.7 10*3/uL (ref 0.1–1.0)
Monocytes Relative: 13 %
Neutro Abs: 2.4 10*3/uL (ref 1.7–7.7)
Neutrophils Relative %: 43 %
Platelets: 128 10*3/uL — ABNORMAL LOW (ref 150–400)
RBC: 3.53 MIL/uL — ABNORMAL LOW (ref 4.22–5.81)
RDW: 16.2 % — ABNORMAL HIGH (ref 11.5–15.5)
WBC: 5.7 10*3/uL (ref 4.0–10.5)
nRBC: 0 % (ref 0.0–0.2)

## 2021-05-24 LAB — COMPREHENSIVE METABOLIC PANEL
ALT: 14 U/L (ref 0–44)
AST: 21 U/L (ref 15–41)
Albumin: 2.5 g/dL — ABNORMAL LOW (ref 3.5–5.0)
Alkaline Phosphatase: 48 U/L (ref 38–126)
Anion gap: 6 (ref 5–15)
BUN: 20 mg/dL (ref 6–20)
CO2: 22 mmol/L (ref 22–32)
Calcium: 8.1 mg/dL — ABNORMAL LOW (ref 8.9–10.3)
Chloride: 111 mmol/L (ref 98–111)
Creatinine, Ser: 1.7 mg/dL — ABNORMAL HIGH (ref 0.61–1.24)
GFR, Estimated: 46 mL/min — ABNORMAL LOW (ref >60)
Glucose, Bld: 99 mg/dL (ref 70–99)
Potassium: 3.3 mmol/L — ABNORMAL LOW (ref 3.5–5.1)
Sodium: 139 mmol/L (ref 135–145)
Total Bilirubin: 0.9 mg/dL (ref 0.3–1.2)
Total Protein: 5.1 g/dL — ABNORMAL LOW (ref 6.5–8.1)

## 2021-05-24 LAB — C-REACTIVE PROTEIN: CRP: 0.6 mg/dL (ref <1.0)

## 2021-05-24 LAB — D-DIMER, QUANTITATIVE: D-Dimer, Quant: 2.17 ug/mL-FEU — ABNORMAL HIGH (ref 0.00–0.50)

## 2021-05-24 MED ORDER — POTASSIUM CHLORIDE CRYS ER 20 MEQ PO TBCR
40.0000 meq | EXTENDED_RELEASE_TABLET | Freq: Once | ORAL | Status: AC
  Administered 2021-05-24: 40 meq via ORAL
  Filled 2021-05-24: qty 2

## 2021-05-24 MED ORDER — CYANOCOBALAMIN 1000 MCG/ML IJ SOLN: 1000.0000 ug | Freq: Once | INTRAMUSCULAR | Status: DC

## 2021-05-24 MED ORDER — LACTATED RINGERS IV SOLN: INTRAVENOUS | Status: DC

## 2021-05-24 MED ORDER — CYANOCOBALAMIN 1000 MCG/ML IJ SOLN
1000.0000 ug | Freq: Once | INTRAMUSCULAR | Status: AC
  Administered 2021-05-24: 1000 ug via INTRAMUSCULAR
  Filled 2021-05-24: qty 1

## 2021-05-24 NOTE — Plan of Care (Signed)

## 2021-05-24 NOTE — Progress Notes (Signed)
PROGRESS NOTE    Gregory [name].  ONG:[id number] DOB: 09-02-1960 DOA: 05/22/2021 PCP: Gregory Ruddy, MD   Chief Complaint  Patient presents with   Abdominal Pain   Nausea    Brief Narrative/Hospital Course: 60 year old male with Crohn's disease with multiple surgeries with colostomy presenting with 5 days history of fatigue cough weakness and increased ostomy output.  Patient had no abdominal pain chills or any blood in the colostomy. He was seen in the ED blood pressure low normal labs showed AKI baseline creatinine 1.4-monthago and in the ED 2.1, with hypokalemia and found to have increased ostomy output given prednisone 60 mg x 1 and IV fluids for concern of exacerbation of Crohn's disease but Had COVID-positive although not hypoxic.  Patient was admitted for further management.   Subjective: Seen and examined.  Patient reports his stool is now becoming more formed but not better yet Overnight no fever, not hypoxic. Able to tolerate p.o.  Assessment & Plan:  Increased colostomy output COVID-19 positive symptomatic  Pt having gi symptoms/weakness-increased output from colostomy fro mcovid 19 infection,but not hypoxic. Continue IV remdesivir, IV fluids, home Imodium 4 times daily, monitor inflammatory markers. Recent Labs    05/22/21 2111 05/23/21 0706 05/24/21 0446  DDIMER  --  1.99* 2.17*  FERRITIN  --  136  --   CRP 0.5 0.6 0.6   AKI Secondary to volume loss and  increased colostomy output.  Continue IV fluid hydration.  encourage oral hydration Recent Labs  Lab 05/22/21 1633 05/23/21 0103 05/23/21 0706 05/24/21 0446  BUN 20 20 21* 20  CREATININE 2.13* 2.01* 2.00* 1.70*    Hypokalemia Repleting  Crohn's disease with colostomy with increased output less likely exacerbation, seen by GI continue home meds  Vitamin B12 deficiency with Macrocytosis: Continue high-dose B12 supplement inj  Leukopenia: Improved suspect due to COVID and B12 deficiency  Mild  thrombocytopenia -again due to covid infection. Continue to monitor  Underweight: Patient's Body mass index is 16.63 kg/m.  2/2 chronic illness. baseline weight last time was 58.1 06/20/20 and 57.6 in 12/17/20- now at 54.1.  RD following augment diet : Diet Order             Diet regular Room service appropriate? Yes; Fluid consistency: Thin  Diet effective now                   Nutrition Problem: Increased nutrient needs Etiology: acute illness (COVID-19 infection) Signs/Symptoms: estimated needs Interventions: Boost Breeze, MVI  DVT prophylaxis: enoxaparin (LOVENOX) injection 30 mg Start: 05/23/21 1000 Code Status:   Code Status: Full Code  Family Communication: plan of care discussed with patient at bedside. Status is: Inpatient Remains inpatient appropriate because:IV treatments appropriate due to intensity of illness or inability to take PO and Inpatient level of care appropriate due to severity of illness Dispo: The patient is from: Home              Anticipated d/c is to: Home likely tomorrow              Patient currently is not medically stable to d/c.   Difficult to place patient No Objective: Vitals: Today's Vitals   05/23/21 1750 05/23/21 2034 05/24/21 0511 05/24/21 1005  BP: 98/67 94/64 95/64  101/66  Pulse: (!) 52 60 (!) 52 62  Resp: 18 18 18 18   Temp: 98.3 F (36.8 C) 98 F (36.7 C) (!) 97.5 F (36.4 C) 98 F (36.7 C)  TempSrc:  Oral Oral Oral Oral  SpO2: 99% 99% 99% 98%  Weight:      Height:      PainSc:  0-No pain     Examination: General exam: AA0x3, thin frail weak,older than stated age HEENT:Oral mucosa moist, Ear/Nose WNL grossly,dentition normal. Respiratory system: bilaterally diminished no use of accessory muscle, non tender. Cardiovascular system: S1 & S2 +, colostomy in place no JVD. Gastrointestinal system: Abdomen soft, NT,ND, BS+. Nervous System:Alert, awake, moving extremities Extremities: no edema, distal peripheral pulses palpable.   Skin: No rashes,no icterus. MSK: Normal muscle bulk,tone, power   Intake/Output Summary (Last 24 hours) at 05/24/2021 1043 Last data filed at 05/24/2021 0900 Gross per 24 hour  Intake 1854 ml  Output 250 ml  Net 1604 ml   Filed Weights   05/22/21 1622 05/23/21 0026  Weight: 56.7 kg 54.1 kg   Weight change:    Consultants:see note  Procedures:see note Antimicrobials: Anti-infectives (From admission, onward)    Start     Dose/Rate Route Frequency Ordered Stop   05/24/21 1000  remdesivir 100 mg in sodium chloride 0.9 % 100 mL IVPB       See Hyperspace for full Linked Orders Report.   100 mg 200 mL/hr over 30 Minutes Intravenous Daily 05/23/21 0355 05/28/21 0959   05/23/21 0530  remdesivir 200 mg in sodium chloride 0.9% 250 mL IVPB       See Hyperspace for full Linked Orders Report.   200 mg 580 mL/hr over 30 Minutes Intravenous Once 05/23/21 0355 05/23/21 0710      Culture/Microbiology    Component Value Date/Time   SDES URINE, RANDOM 05/18/2017 1900   SPECREQUEST NONE 05/18/2017 1900   CULT  05/18/2017 1900    NO GROWTH Performed at [facility], [place] [address], [place],  [address]    REPTSTATUS 05/20/2017 FINAL 05/18/2017 1900    Other culture-see note  Unresulted Labs (From admission, onward)     Start     Ordered   05/29/21 0500  Creatinine, serum  (enoxaparin (LOVENOX)    CrCl >/= 30 ml/min)  Weekly,   R     Comments: while on enoxaparin therapy    05/22/21 2305   05/24/21 0500  CBC with Differential/Platelet  Daily,   R      05/23/21 0652   05/24/21 0500  Comprehensive metabolic panel  Daily,   R      05/23/21 0652   05/24/21 0500  C-reactive protein  Daily,   R      05/23/21 2536           Medications reviewed:  Scheduled Meds:  cyanocobalamin  1,000 mcg Intramuscular Once   enoxaparin (LOVENOX) injection  30 mg Subcutaneous Q24H   feeding supplement  1 Container Oral TID BM   loperamide  2 mg Oral Q6H   multivitamin with  minerals  1 tablet Oral Daily   potassium chloride  20 mEq Oral Daily   Continuous Infusions:  remdesivir 100 mg in NS 100 mL Stopped (05/24/21 0947)     Intake/Output from previous day: 09/23 0701 - 09/24 0700 In: 1834 [P.O.:1834] Out: -  Intake/Output this shift: Total I/O In: 360 [P.O.:360] Out: 250 [Urine:250] Filed Weights   05/22/21 1622 05/23/21 0026  Weight: 56.7 kg 54.1 kg   Data Reviewed: I have personally reviewed following labs and imaging studies CBC: Recent Labs  Lab 05/22/21 1633 05/23/21 0103 05/23/21 0706 05/24/21 0446  WBC 5.4 3.3* 1.9* 5.7  NEUTROABS  --   --  1.2* 2.4  HGB 15.4 13.2 12.3* 12.0*  HCT 45.6 37.8* 35.5* 36.4*  MCV 100.9* 99.7 100.9* 103.1*  PLT 170 139* 136* 655*   Basic Metabolic Panel: Recent Labs  Lab 05/22/21 1633 05/23/21 0103 05/23/21 0706 05/24/21 0446  NA 134* 135 136 139  K 3.3* 2.7* 3.1* 3.3*  CL 100 104 105 111  CO2 20* 21* 20* 22  GLUCOSE 112* 175* 142* 99  BUN 20 20 21* 20  CREATININE 2.13* 2.01* 2.00* 1.70*  CALCIUM 8.9 8.1* 7.8* 8.1*   GFR: Estimated Creatinine Clearance: 35.4 mL/min (A) (by C-G formula based on SCr of 1.7 mg/dL (H)). Liver Function Tests: Recent Labs  Lab 05/22/21 1633 05/23/21 0103 05/23/21 0706 05/24/21 0446  AST 23 22 18 21   ALT 10 14 13 14   ALKPHOS 68 61 53 48  BILITOT 0.9 0.9 0.8 0.9  PROT 7.2 5.7* 5.1* 5.1*  ALBUMIN 4.1 2.8* 2.5* 2.5*   Recent Labs  Lab 05/22/21 1633  LIPASE 80*   No results for input(s): AMMONIA in the last 168 hours. Coagulation Profile: No results for input(s): INR, PROTIME in the last 168 hours. Cardiac Enzymes: No results for input(s): CKTOTAL, CKMB, CKMBINDEX, TROPONINI in the last 168 hours. BNP (last 3 results) No results for input(s): PROBNP in the last 8760 hours. HbA1C: No results for input(s): HGBA1C in the last 72 hours. CBG: No results for input(s): GLUCAP in the last 168 hours. Lipid Profile: No results for input(s): CHOL, HDL,  LDLCALC, TRIG, CHOLHDL, LDLDIRECT in the last 72 hours. Thyroid Function Tests: No results for input(s): TSH, T4TOTAL, FREET4, T3FREE, THYROIDAB in the last 72 hours. Anemia Panel: Recent Labs    05/23/21 0706  VITAMINB12 101*  FOLATE 14.9  FERRITIN 136  TIBC 246*  IRON 37*  RETICCTPCT 0.5   Sepsis Labs: No results for input(s): PROCALCITON, LATICACIDVEN in the last 168 hours.  Recent Results (from the past 240 hour(s))  Resp Panel by RT-PCR (Flu A&B, Covid) Nasopharyngeal Swab     Status: Abnormal   Collection Time: 05/22/21  8:56 PM   Specimen: Nasopharyngeal Swab; Nasopharyngeal(NP) swabs in vial transport medium  Result Value Ref Range Status   SARS Coronavirus 2 by RT PCR POSITIVE (A) NEGATIVE Final    Comment: RESULT CALLED TO, READ BACK BY AND VERIFIED WITH: [place] RN AT 2156 05/22/21 CH (NOTE) SARS-CoV-2 target nucleic acids are DETECTED.  The SARS-CoV-2 RNA is generally detectable in upper respiratory specimens during the acute phase of infection. Positive results are indicative of the presence of the identified virus, but do not rule out bacterial infection or co-infection with other pathogens not detected by the test. Clinical correlation with patient history and other diagnostic information is necessary to determine patient infection status. The expected result is Negative.  Fact Sheet for Patients: EntrepreneurPulse.com.au  Fact Sheet for Healthcare Providers: IncredibleEmployment.be  This test is not yet approved or cleared by the [place] FDA and  has been authorized for detection and/or diagnosis of SARS-CoV-2 by FDA under an Emergency Use Authorization (EUA).  This EUA will remain in effect (meaning this test can be  used) for the duration of  the COVID-19 declaration under Section 564(b)(1) of the Act, 21 U.S.C. section 360bbb-3(b)(1), unless the authorization is terminated or revoked sooner.     Influenza  A by PCR NEGATIVE NEGATIVE Final   Influenza B by PCR NEGATIVE NEGATIVE Final    Comment: (NOTE) The Xpert  Xpress SARS-CoV-2/FLU/RSV plus assay is intended as an aid in the diagnosis of influenza from Nasopharyngeal swab specimens and should not be used as a sole basis for treatment. Nasal washings and aspirates are unacceptable for Xpert Xpress SARS-CoV-2/FLU/RSV testing.  Fact Sheet for Patients: EntrepreneurPulse.com.au  Fact Sheet for Healthcare Providers: IncredibleEmployment.be  This test is not yet approved or cleared by the [place] FDA and has been authorized for detection and/or diagnosis of SARS-CoV-2 by FDA under an Emergency Use Authorization (EUA). This EUA will remain in effect (meaning this test can be used) for the duration of the COVID-19 declaration under Section 564(b)(1) of the Act, 21 U.S.C. section 360bbb-3(b)(1), unless the authorization is terminated or revoked.  Performed at [facility], [address], [place], [place] [address]      Radiology Studies: DG CHEST PORT 1 VIEW  Result Date: 05/23/2021 CLINICAL DATA:  60 year old male positive COVID-19. EXAM: PORTABLE CHEST 1 VIEW COMPARISON:  Chest radiographs 09/14/2017 and earlier. FINDINGS: Portable AP semi upright views at 0455 hours. Chronic pulmonary hyperinflation and large bullous emphysema in the left lung. Stable lung volumes and mediastinal contours since 2019. Visualized tracheal air column is within normal limits. No pneumothorax, pulmonary edema, pleural effusion or consolidation. Lung markings appear stable since 2019. No acute osseous abnormality identified. Negative visible bowel gas. IMPRESSION: Bullous Emphysema (ICD10-J43.9). No acute cardiopulmonary abnormality. Electronically Signed   By: Genevie Ann M.D.   On: 05/23/2021 05:43     LOS: 2 days   Antonieta Pert, MD [facility]  05/24/2021, 10:43 AM

## 2021-05-24 NOTE — Progress Notes (Addendum)
   Patient Name: Gregory [name]. Date of Encounter: 05/24/2021, 10:06 AM    Subjective  Eating solid food ok No diarrhea, no nausea and vomitng   Objective  BP 95/64 (BP Location: Left Arm)   Pulse (!) 52   Temp (!) 97.5 F (36.4 C) (Oral)   Resp 18   Ht 5' 11"  (1.803 m)   Wt 54.1 kg   SpO2 99%   BMI 16.63 kg/m   Recent Labs  Lab 05/22/21 1633 05/23/21 0103 05/23/21 0706 05/24/21 0446  NA 134* 135 136 139  K 3.3* 2.7* 3.1* 3.3*  CL 100 104 105 111  CO2 20* 21* 20* 22  GLUCOSE 112* 175* 142* 99  BUN 20 20 21* 20  CREATININE 2.13* 2.01* 2.00* 1.70*  CALCIUM 8.9 8.1* 7.8* 8.1*   CBC Latest Ref Rng & Units 05/24/2021 05/23/2021 05/23/2021  WBC 4.0 - 10.5 K/uL 5.7 1.9(L) 3.3(L)  Hemoglobin 13.0 - 17.0 g/dL 12.0(L) 12.3(L) 13.2  Hematocrit 39.0 - 52.0 % 36.4(L) 35.5(L) 37.8(L)  Platelets 150 - 400 K/uL 128(L) 136(L) 139(L)    Lab Results  Component Value Date   VITAMINB12 101 (L) 05/23/2021      Assessment and Plan  Crohn's ileocolitis Gastroenteritis secondary to Covid-19 - cause of GI Sxs AKI B12 deficiency  Improved Continue supportive care GI signing off - call if ?'s  I will have my office staff make him a follow-up appointment (they will do this Monday) with Dr. Havery Moros.  ? If he has been taking his B12 - supposed to get injections monthly - I doscovered this after I rounded so please check with him - I ordered injection   Gatha Mayer, MD, [facility] 05/24/2021 10:06 AM

## 2021-05-25 DIAGNOSIS — U071 COVID-19: Secondary | ICD-10-CM | POA: Diagnosis not present

## 2021-05-25 LAB — BASIC METABOLIC PANEL
Anion gap: 3 — ABNORMAL LOW (ref 5–15)
BUN: 15 mg/dL (ref 6–20)
CO2: 21 mmol/L — ABNORMAL LOW (ref 22–32)
Calcium: 8.1 mg/dL — ABNORMAL LOW (ref 8.9–10.3)
Chloride: 115 mmol/L — ABNORMAL HIGH (ref 98–111)
Creatinine, Ser: 1.37 mg/dL — ABNORMAL HIGH (ref 0.61–1.24)
GFR, Estimated: 59 mL/min — ABNORMAL LOW (ref >60)
Glucose, Bld: 95 mg/dL (ref 70–99)
Potassium: 4 mmol/L (ref 3.5–5.1)
Sodium: 139 mmol/L (ref 135–145)

## 2021-05-25 MED ORDER — CYANOCOBALAMIN 1000 MCG/ML IJ SOLN
1000.0000 ug | Freq: Once | INTRAMUSCULAR | Status: AC
  Administered 2021-05-25: 1000 ug via INTRAMUSCULAR
  Filled 2021-05-25: qty 1

## 2021-05-25 NOTE — Discharge Summary (Signed)
Physician Discharge Summary  Gregory [name]. IWL:[id number] DOB: 09-Jan-1961 DOA: 05/22/2021  PCP: Billie Ruddy, MD  Admit date: 05/22/2021 Discharge date: 05/25/2021  Admitted From: home Disposition:  home  Recommendations for Outpatient Follow-up:  Follow up with PCP in 1-2 weeks Please obtain BMP/CBC in one week:  Home Health:No  Equipment/Devices:None  Discharge Condition: Stable Code Status:   Code Status: Full Code Diet recommendation:  Diet Order             Diet regular Room service appropriate? Yes; Fluid consistency: Thin  Diet effective now                    Brief/Interim Summary: 60 year old male with Crohn's disease with multiple surgeries with colostomy presenting with 5 days history of fatigue cough weakness and increased ostomy output.  Patient had no abdominal pain chills or any blood in the colostomy. He was seen in the ED blood pressure low normal labs showed AKI baseline creatinine 1.65-monthago and in the ED 2.1, with hypokalemia and found to have increased ostomy output given prednisone 60 mg x 1 and IV fluids for concern of exacerbation of Crohn's disease but Had COVID-positive although not hypoxic.  Patient was admitted for further management. Patient was found to have COVID-19 infection and treated with remdesivir not hypoxic.  He had AKI with increased colostomy output due to COVID-19 infection.  He was managed conservatively seen by GI managed IV fluids at this time as tolerated stable regular changing 3-5 times a day which is normal schedule.  Will discharge home with outpatient follow-up with PCP and GI and CBC BMP check in a week.  Discharge Diagnoses:   Increased colostomy output COVID-19 positive symptomatic:  Pt having gi symptoms/weakness-increased output from colostomy fro mcovid 19 infection,but not hypoxic. S/p IV remdesivir, IV fluids, cont home Imodium 4 times daily Recent Labs    05/22/21 2111 05/23/21 0706 05/24/21 0446   DDIMER  --  1.99* 2.17*  FERRITIN  --  136  --   CRP 0.5 0.6 0.6   AKI: Secondary to volume loss and  increased colostomy output.  Continue IV fluid hydration.  encourage oral hydration Recent Labs  Lab 05/22/21 1633 05/23/21 0103 05/23/21 0706 05/24/21 0446 05/25/21 0724  BUN 20 20 21* 20 15  CREATININE 2.13* 2.01* 2.00* 1.70* 1.37*    Hypokalemia:Cont po  Crohn's disease with colostomy with increased output less likely exacerbation, seen by GI continue home meds  Vitamin B12 deficiency with Macrocytosis: Continue high-dose B12 supplement- s/p in j here  Leukopenia: suspect due to COVID and B12 deficiency. Cbc in 1 wk  Mild thrombocytopenia -again due to covid infection. Continue to monitor cbc  in 1 wk  Underweight: Patient's Body mass index is 16.63 kg/m.  2/2 chronic illness. baseline weight last time was 58.1 06/20/20 and 57.6 in 12/17/20- now at 54.1.  RD following augment diet Nutrition Problem: Increased nutrient needs Etiology: acute illness (COVID-19 infection) Signs/Symptoms: estimated needs Interventions: Boost Breeze, MVI     Consults: gi  Subjective: Alert awake oriented not in distress.  He feels ready for discharge home today. Discharge Exam: Vitals:   05/24/21 2101 05/25/21 0521  BP: 93/64 97/62  Pulse: 61 (!) 56  Resp: 18 18  Temp: 98.1 F (36.7 C) 98.6 F (37 C)  SpO2: 100% 100%   General: Pt is alert, awake, not in acute distress Cardiovascular: RRR, S1/S2 +, no rubs, no gallops Respiratory: CTA bilaterally, no wheezing,  no rhonchi Abdominal: Soft, NT, ND, bowel sounds +.  Colostomy in place Extremities: no edema, no cyanosis  Discharge Instructions  Discharge Instructions     Discharge instructions   Complete by: As directed    Outpatient follow-up with GI in a week.  BMP in a week from PCP   Increase activity slowly   Complete by: As directed       Allergies as of 05/25/2021       Reactions   Methotrexate Derivatives Other  (See Comments)   Anemia, low WBC, severe GI symptoms,    Phenergan [promethazine Hcl] Other (See Comments)   twitching   Humira [adalimumab] Other (See Comments)   Intolerance   Penicillins Other (See Comments)   REACTION DURING SURGERY Has patient had a PCN reaction causing immediate rash, facial/tongue/throat swelling, SOB or lightheadedness with hypotension: Unknown Has patient had a PCN reaction causing severe rash involving mucus membranes or skin necrosis: Unknown Has patient had a PCN reaction that required hospitalization: Unknown Has patient had a PCN reaction occurring within the last 10 years: No If all of the above answers are "NO", then may proceed with Cephalosporin use.   Remicade [infliximab] Other (See Comments)   Intolerance        Medication List     TAKE these medications    acetaminophen 500 MG tablet Commonly known as: TYLENOL Take 500 mg by mouth every 6 (six) hours as needed for moderate pain.   diphenoxylate-atropine 2.5-0.025 MG tablet Commonly known as: LOMOTIL TAKE 1 TABLET 4 TIMES A DAY AS NEEDED FOR DIARRHEA OR LOOSE STOOLS What changed: See the new instructions.   multivitamin with minerals Tabs tablet Take 1 tablet by mouth daily.   omeprazole 20 MG capsule Commonly known as: PRILOSEC Take 1 capsule (20 mg total) by mouth daily.   ondansetron 4 MG tablet Commonly known as: ZOFRAN TAKE 1 TABLET EVERY 8 HOURS AS NEEDED FOR NASUEA OR VOMITING What changed: See the new instructions.   potassium chloride 20 MEQ packet Commonly known as: KLOR-CON Take 20 mEq by mouth daily.   sertraline 50 MG tablet Commonly known as: ZOLOFT Take 1 tablet (50 mg total) by mouth daily.   Stelara 90 MG/ML Sosy injection Generic drug: ustekinumab Inject 1 mL (90 mg total) into the skin every 28 (twenty-eight) days.   traMADol 50 MG tablet Commonly known as: ULTRAM TAKE ONE TABLET EVERY 8 HOURS AS NEEDED FOR PAIN What changed: See the new  instructions.   Vitamin D (Ergocalciferol) 50 MCG (2000 UT) Caps Take 4,000 Units by mouth daily.        Allergies  Allergen Reactions   Methotrexate Derivatives Other (See Comments)    Anemia, low WBC, severe GI symptoms,    Phenergan [Promethazine Hcl] Other (See Comments)    twitching   Humira [Adalimumab] Other (See Comments)    Intolerance   Penicillins Other (See Comments)    REACTION DURING SURGERY Has patient had a PCN reaction causing immediate rash, facial/tongue/throat swelling, SOB or lightheadedness with hypotension: Unknown Has patient had a PCN reaction causing severe rash involving mucus membranes or skin necrosis: Unknown Has patient had a PCN reaction that required hospitalization: Unknown Has patient had a PCN reaction occurring within the last 10 years: No If all of the above answers are "NO", then may proceed with Cephalosporin use.    Remicade [Infliximab] Other (See Comments)    Intolerance    The results of significant diagnostics from this hospitalization (including  imaging, microbiology, ancillary and laboratory) are listed below for reference.    Microbiology: Recent Results (from the past 240 hour(s))  Resp Panel by RT-PCR (Flu A&B, Covid) Nasopharyngeal Swab     Status: Abnormal   Collection Time: 05/22/21  8:56 PM   Specimen: Nasopharyngeal Swab; Nasopharyngeal(NP) swabs in vial transport medium  Result Value Ref Range Status   SARS Coronavirus 2 by RT PCR POSITIVE (A) NEGATIVE Final    Comment: RESULT CALLED TO, READ BACK BY AND VERIFIED WITH: CINDY REED RN AT 2156 05/22/21 CH (NOTE) SARS-CoV-2 target nucleic acids are DETECTED.  The SARS-CoV-2 RNA is generally detectable in upper respiratory specimens during the acute phase of infection. Positive results are indicative of the presence of the identified virus, but do not rule out bacterial infection or co-infection with other pathogens not detected by the test. Clinical correlation with  patient history and other diagnostic information is necessary to determine patient infection status. The expected result is Negative.  Fact Sheet for Patients: EntrepreneurPulse.com.au  Fact Sheet for Healthcare Providers: IncredibleEmployment.be  This test is not yet approved or cleared by the [place] FDA and  has been authorized for detection and/or diagnosis of SARS-CoV-2 by FDA under an Emergency Use Authorization (EUA).  This EUA will remain in effect (meaning this test can be  used) for the duration of  the COVID-19 declaration under Section 564(b)(1) of the Act, 21 U.S.C. section 360bbb-3(b)(1), unless the authorization is terminated or revoked sooner.     Influenza A by PCR NEGATIVE NEGATIVE Final   Influenza B by PCR NEGATIVE NEGATIVE Final    Comment: (NOTE) The Xpert Xpress SARS-CoV-2/FLU/RSV plus assay is intended as an aid in the diagnosis of influenza from Nasopharyngeal swab specimens and should not be used as a sole basis for treatment. Nasal washings and aspirates are unacceptable for Xpert Xpress SARS-CoV-2/FLU/RSV testing.  Fact Sheet for Patients: EntrepreneurPulse.com.au  Fact Sheet for Healthcare Providers: IncredibleEmployment.be  This test is not yet approved or cleared by the [place] FDA and has been authorized for detection and/or diagnosis of SARS-CoV-2 by FDA under an Emergency Use Authorization (EUA). This EUA will remain in effect (meaning this test can be used) for the duration of the COVID-19 declaration under Section 564(b)(1) of the Act, 21 U.S.C. section 360bbb-3(b)(1), unless the authorization is terminated or revoked.  Performed at [facility], [address] [place], [place] [address]     Procedures/Studies: DG CHEST PORT 1 VIEW  Result Date: 05/23/2021 CLINICAL DATA:  60 year old male positive COVID-19. EXAM: PORTABLE CHEST 1  VIEW COMPARISON:  Chest radiographs 09/14/2017 and earlier. FINDINGS: Portable AP semi upright views at 0455 hours. Chronic pulmonary hyperinflation and large bullous emphysema in the left lung. Stable lung volumes and mediastinal contours since 2019. Visualized tracheal air column is within normal limits. No pneumothorax, pulmonary edema, pleural effusion or consolidation. Lung markings appear stable since 2019. No acute osseous abnormality identified. Negative visible bowel gas. IMPRESSION: Bullous Emphysema (ICD10-J43.9). No acute cardiopulmonary abnormality. Electronically Signed   By: Genevie Ann M.D.   On: 05/23/2021 05:43    Labs: BNP (last 3 results) No results for input(s): BNP in the last 8760 hours. Basic Metabolic Panel: Recent Labs  Lab 05/22/21 1633 05/23/21 0103 05/23/21 0706 05/24/21 0446 05/25/21 0724  NA 134* 135 136 139 139  K 3.3* 2.7* 3.1* 3.3* 4.0  CL 100 104 105 111 115*  CO2 20* 21* 20* 22 21*  GLUCOSE 112* 175* 142* 99 95  BUN 20 20 21* 20 15  CREATININE 2.13* 2.01* 2.00* 1.70* 1.37*  CALCIUM 8.9 8.1* 7.8* 8.1* 8.1*   Liver Function Tests: Recent Labs  Lab 05/22/21 1633 05/23/21 0103 05/23/21 0706 05/24/21 0446  AST 23 22 18 21   ALT 10 14 13 14   ALKPHOS 68 61 53 48  BILITOT 0.9 0.9 0.8 0.9  PROT 7.2 5.7* 5.1* 5.1*  ALBUMIN 4.1 2.8* 2.5* 2.5*   Recent Labs  Lab 05/22/21 1633  LIPASE 80*   No results for input(s): AMMONIA in the last 168 hours. CBC: Recent Labs  Lab 05/22/21 1633 05/23/21 0103 05/23/21 0706 05/24/21 0446  WBC 5.4 3.3* 1.9* 5.7  NEUTROABS  --   --  1.2* 2.4  HGB 15.4 13.2 12.3* 12.0*  HCT 45.6 37.8* 35.5* 36.4*  MCV 100.9* 99.7 100.9* 103.1*  PLT 170 139* 136* 128*   Cardiac Enzymes: No results for input(s): CKTOTAL, CKMB, CKMBINDEX, TROPONINI in the last 168 hours. BNP: Invalid input(s): POCBNP CBG: No results for input(s): GLUCAP in the last 168 hours. D-Dimer Recent Labs    05/23/21 0706 05/24/21 0446  DDIMER  1.99* 2.17*   Hgb A1c No results for input(s): HGBA1C in the last 72 hours. Lipid Profile No results for input(s): CHOL, HDL, LDLCALC, TRIG, CHOLHDL, LDLDIRECT in the last 72 hours. Thyroid function studies No results for input(s): TSH, T4TOTAL, T3FREE, THYROIDAB in the last 72 hours.  Invalid input(s): FREET3 Anemia work up Recent Labs    05/23/21 0706  VITAMINB12 101*  FOLATE 14.9  FERRITIN 136  TIBC 246*  IRON 37*  RETICCTPCT 0.5   Urinalysis    Component Value Date/Time   COLORURINE YELLOW 12/19/2019 1810   APPEARANCEUR CLEAR 12/19/2019 1810   LABSPEC 1.008 12/19/2019 1810   PHURINE 6.0 12/19/2019 1810   GLUCOSEU NEGATIVE 12/19/2019 1810   GLUCOSEU NEGATIVE 10/20/2017 1418   [place] 12/19/2019 1810   BILIRUBINUR NEGATIVE 12/19/2019 1810   KETONESUR NEGATIVE 12/19/2019 1810   PROTEINUR NEGATIVE 12/19/2019 1810   UROBILINOGEN 0.2 10/20/2017 1418   NITRITE NEGATIVE 12/19/2019 1810   LEUKOCYTESUR NEGATIVE 12/19/2019 1810   Sepsis Labs Invalid input(s): PROCALCITONIN,  WBC,  LACTICIDVEN Microbiology Recent Results (from the past 240 hour(s))  Resp Panel by RT-PCR (Flu A&B, Covid) Nasopharyngeal Swab     Status: Abnormal   Collection Time: 05/22/21  8:56 PM   Specimen: Nasopharyngeal Swab; Nasopharyngeal(NP) swabs in vial transport medium  Result Value Ref Range Status   SARS Coronavirus 2 by RT PCR POSITIVE (A) NEGATIVE Final    Comment: RESULT CALLED TO, READ BACK BY AND VERIFIED WITH: CINDY REED RN AT 2156 05/22/21 CH (NOTE) SARS-CoV-2 target nucleic acids are DETECTED.  The SARS-CoV-2 RNA is generally detectable in upper respiratory specimens during the acute phase of infection. Positive results are indicative of the presence of the identified virus, but do not rule out bacterial infection or co-infection with other pathogens not detected by the test. Clinical correlation with patient history and other diagnostic information is necessary to determine  patient infection status. The expected result is Negative.  Fact Sheet for Patients: EntrepreneurPulse.com.au  Fact Sheet for Healthcare Providers: IncredibleEmployment.be  This test is not yet approved or cleared by the [place] FDA and  has been authorized for detection and/or diagnosis of SARS-CoV-2 by FDA under an Emergency Use Authorization (EUA).  This EUA will remain in effect (meaning this test can be  used) for the duration of  the COVID-19 declaration under Section 564(b)(1) of  the Act, 21 U.S.C. section 360bbb-3(b)(1), unless the authorization is terminated or revoked sooner.     Influenza A by PCR NEGATIVE NEGATIVE Final   Influenza B by PCR NEGATIVE NEGATIVE Final    Comment: (NOTE) The Xpert Xpress SARS-CoV-2/FLU/RSV plus assay is intended as an aid in the diagnosis of influenza from Nasopharyngeal swab specimens and should not be used as a sole basis for treatment. Nasal washings and aspirates are unacceptable for Xpert Xpress SARS-CoV-2/FLU/RSV testing.  Fact Sheet for Patients: EntrepreneurPulse.com.au  Fact Sheet for Healthcare Providers: IncredibleEmployment.be  This test is not yet approved or cleared by the [place] FDA and has been authorized for detection and/or diagnosis of SARS-CoV-2 by FDA under an Emergency Use Authorization (EUA). This EUA will remain in effect (meaning this test can be used) for the duration of the COVID-19 declaration under Section 564(b)(1) of the Act, 21 U.S.C. section 360bbb-3(b)(1), unless the authorization is terminated or revoked.  Performed at [facility], [address] [place], [place] [address]      Time coordinating discharge: 25 minutes  SIGNED: Antonieta Pert, MD  [facility] 05/25/2021, 9:21 AM  If 7PM-7AM, please contact night-coverage [email or web address]

## 2021-05-26 ENCOUNTER — Telehealth: Payer: Self-pay

## 2021-05-26 NOTE — Telephone Encounter (Signed)
-----   Message from Gatha Mayer, MD sent at 05/24/2021 10:21 AM EDT ----- Regarding: appt Gregory [name],  Patient is in hospital w/ Covid - had gastroenteritis from that  Looks like an appropriate time to set up a routine f/u Dr. Havery Moros so please do that.  Thanks  CEG

## 2021-05-26 NOTE — Telephone Encounter (Signed)
Patient has been scheduled for a follow up with Dr. Havery Moros on Thursday, 07/10/21 at 11 am. Pt notified of appt via my chart.

## 2021-05-28 ENCOUNTER — Telehealth: Payer: Self-pay

## 2021-05-28 NOTE — Telephone Encounter (Signed)
Transition Care Management Unsuccessful Follow-up Telephone Call  Date of discharge and from where:  Gregory [name] 05/25/2021  Attempts:  1st Attempt  Reason for unsuccessful TCM follow-up call:  Unable to reach patient

## 2021-05-29 NOTE — Telephone Encounter (Signed)
Transition Care Management Unsuccessful Follow-up Telephone Call  Date of discharge and from where:  Gregory [name] 05/25/2021  Attempts:  2nd Attempt  Reason for unsuccessful TCM follow-up call:  Unable to reach patient

## 2021-05-30 NOTE — Telephone Encounter (Signed)
Transition Care Management Unsuccessful Follow-up Telephone Call  Date of discharge and from where:  [facility]/25/2021  Attempts:  3rd Attempt  Reason for unsuccessful TCM follow-up call:  Unable to reach patient

## 2021-05-31 DIAGNOSIS — U071 COVID-19: Secondary | ICD-10-CM

## 2021-05-31 HISTORY — DX: COVID-19: U07.1

## 2021-07-01 ENCOUNTER — Other Ambulatory Visit: Payer: Self-pay

## 2021-07-01 DIAGNOSIS — E559 Vitamin D deficiency, unspecified: Secondary | ICD-10-CM

## 2021-07-01 DIAGNOSIS — K50819 Crohn's disease of both small and large intestine with unspecified complications: Secondary | ICD-10-CM

## 2021-07-08 ENCOUNTER — Other Ambulatory Visit (INDEPENDENT_AMBULATORY_CARE_PROVIDER_SITE_OTHER): Payer: Medicare Other

## 2021-07-08 DIAGNOSIS — E559 Vitamin D deficiency, unspecified: Secondary | ICD-10-CM | POA: Diagnosis not present

## 2021-07-08 DIAGNOSIS — K50819 Crohn's disease of both small and large intestine with unspecified complications: Secondary | ICD-10-CM | POA: Diagnosis not present

## 2021-07-08 LAB — VITAMIN D 25 HYDROXY (VIT D DEFICIENCY, FRACTURES): VITD: 31.42 ng/mL (ref 30.00–100.00)

## 2021-07-10 ENCOUNTER — Ambulatory Visit (INDEPENDENT_AMBULATORY_CARE_PROVIDER_SITE_OTHER): Payer: Medicare Other | Admitting: Gastroenterology

## 2021-07-10 ENCOUNTER — Other Ambulatory Visit: Payer: Medicare Other

## 2021-07-10 ENCOUNTER — Encounter: Payer: Self-pay | Admitting: Gastroenterology

## 2021-07-10 VITALS — BP 100/60 | HR 79 | Ht 71.0 in | Wt 123.0 lb

## 2021-07-10 DIAGNOSIS — E559 Vitamin D deficiency, unspecified: Secondary | ICD-10-CM

## 2021-07-10 DIAGNOSIS — Z23 Encounter for immunization: Secondary | ICD-10-CM

## 2021-07-10 DIAGNOSIS — M810 Age-related osteoporosis without current pathological fracture: Secondary | ICD-10-CM

## 2021-07-10 DIAGNOSIS — K50819 Crohn's disease of both small and large intestine with unspecified complications: Secondary | ICD-10-CM | POA: Diagnosis not present

## 2021-07-10 DIAGNOSIS — E538 Deficiency of other specified B group vitamins: Secondary | ICD-10-CM

## 2021-07-10 DIAGNOSIS — Z933 Colostomy status: Secondary | ICD-10-CM

## 2021-07-10 DIAGNOSIS — Z79899 Other long term (current) drug therapy: Secondary | ICD-10-CM

## 2021-07-10 DIAGNOSIS — K219 Gastro-esophageal reflux disease without esophagitis: Secondary | ICD-10-CM | POA: Diagnosis not present

## 2021-07-10 MED ORDER — OMEPRAZOLE 20 MG PO CPDR: 20.0000 mg | DELAYED_RELEASE_CAPSULE | Freq: Every day | ORAL | 1 refills | Status: DC

## 2021-07-10 MED ORDER — CYANOCOBALAMIN 1000 MCG/ML IJ SOLN: 1000.0000 ug | INTRAMUSCULAR | 1 refills | Status: DC

## 2021-07-10 MED ORDER — LOMOTIL 2.5-0.025 MG PO TABS
1.0000 | ORAL_TABLET | Freq: Four times a day (QID) | ORAL | 2 refills | Status: DC | PRN
Start: 2021-07-10 — End: 2022-01-30

## 2021-07-10 NOTE — Patient Instructions (Addendum)
If you are age 60 or older, your body mass index should be between 23-30. Your Body mass index is 17.16 kg/m. If this is out of the aforementioned range listed, please consider follow up with your Primary Care Provider.  If you are age 33 or younger, your body mass index should be between 19-25. Your Body mass index is 17.16 kg/m. If this is out of the aformentioned range listed, please consider follow up with your Primary Care Provider.   ________________________________________________________  The [facility] providers would like to encourage you to use [facility] to communicate with providers for non-urgent requests or questions.  Due to long hold times on the telephone, sending your provider a message by [facility] may be a faster and more efficient way to get a response.  Please allow 48 business hours for a response.  Please remember that this is for non-urgent requests.  _______________________________________________________  We are giving you a flu shot today.  Please go to the lab in the basement of our building to have lab work done as you leave today. Hit "B" for basement when you get on the elevator.  When the doors open the lab is on your left.  We will call you with the results. Thank you.  Continue Vitamin D.  We have sent the following medications to your pharmacy for you to pick up at your convenience: Omeprazole 20 mg: Take once daily Vitamin B12: Inject 1000 mcg/1 ml into muscle once every 30 days Lomotil (Brand): Take four times a day as needed  Please see your PCP regarding osteoporosis.  Please follow up in 6 months.  Thank you for entrusting me with your care and for choosing [facility], Dr. [place] Cellar

## 2021-07-10 NOTE — Progress Notes (Signed)
HPI :  Crohn's history Diagnosed with Crohn's disease in 79s. 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 He has a colostomy with a remnant rectal pouch. He reports he has had a bowel surgery in the 1982, with a "few more surgeries" in years after while in [place], as well as in [facility], they tried reversing his colostomy which did not work well for him, too much diarrhea and it was then reversed back. He has a history of abscess associated with small bowel.    He has been hospitalized multiple times. He previously had been on chronic prednisone for 10 years with fluctuating levels. He was on Cimzia for about 4-5 years in the past.  He has been on sulfasalazine for a long time. He reports an allergy to 6MP - he does not know the details of the reaction. He had an allergic reaction - hives, to both Remicade and Humira. Allergy to methotrexate. Failed therapeutic Entyvio, now on Stelara since March 2019, with dosing increase to once monthly.    SINCE LAST VISIT   60 year old male here for follow-up visit for his Crohn's disease.  He continues to take Stelara once monthly and states he remains compliant with it.  Unfortunately he was hospitalized with COVID at the end of September.  He states he lost about 9 pounds in the hospital with that.  He had some diarrhea with the illness but thought it was due to the virus and that it was not necessarily a Crohn's flare.  He was given brand-name Lomotil during hospitalization and states it worked much better than the generic he has been taking in the past.  He states his stools have been much more formed on that regimen and he is quite happy with it.  This is been out of the hospital has been doing well.  He denies any problems with his bowels other than the loose stools.  He is not having any bleeding.  No abdominal pains.  No obstructive symptoms.  He was taking B12 oral supplementation over the past  year.  He states however only was taking 250 mcg/day.  He had a level less than 200 while in the hospital and received a B12 injection while there.  He has been compliant with vitamin D at 4000 units/day and is vitamin D level has recently risen above 30 for the first time in a while.  He had a DEXA scan done in April showing osteoporosis.  I had recommended that he see his primary care about that, he has not followed up with them yet.  He previously had complained of reflux symptoms as well despite taking Pepcid.  We transitioned him to omeprazole 20 mg daily.  He states that works really well for him and he is happy with the regimen but he ran out of it.  He has been taking 3 or 4 over-the-counter antacids per day, he thinks Pepcid but is not sure.  He denies any dysphagia.  No nausea or vomiting.  He is trying to regain the weight he lost during his hospitalization.  Generally he remains happy with the regimen.  He is due for his flu shot today as well as QuantiFERON gold testing.   Prior evaluation: EGD 03/21/2009 - normal Colonoscopy 08/19/2010 - colo-colonic anastomosis at 60cm, active colitis and nonobstructive stricture at 70cm, ileocolonic stricture at 75cm Colonoscopy 08/2017 - normal colon, active ileitis   Colonoscopy  07/05/2018 - Diffuse inflammation, graded  as Rutgeerts Score i3 (diffuse aphthous ileitis) and characterized by erythema and aphthous ulcerations was found in the terminal ileum and at the surgical anastomosis (located in the right colon). Normal colon, however prep was poor in certain portion of the colon and was inadequate for screening purposes to rule out polyps, etc. The ostomy was normal..   Colonoscopy 06/11/20 - Localized inflammation, graded as Rutgeerts Score i2 and characterized by shallow ulcerations was found at the surgical anastomosis. In the distal 10cm of ileum there were scattered small ulcerations with normal intervening mucosa. The small bowel was  deeply intubated and mucosa proximal to the distal 10cm of ileum was entirely normal. Scattered medium-mouthed diverticula were found in the entire colon. The exam was otherwise without abnormality. No colonic inflammation. No polyps.   IBD Health Care Maintenance: Annual Flu Vaccine - 2022 DUE Pneumococcal Vaccine if receiving immunosuppression: - PCV 13 01/10/18, PPSV23 04/10/19 Zoster vaccine if over age 64: 06/28/19 TB testing if on anti-TNF, yearly - Date 4/21 negative Vitamin D screening - level > 30 07/09/21 Last Colonoscopy - 10/21 COVID 19 vaccine UTD   CT 12/19/19 - IMPRESSION: 1. Progressive wall thickening involving a loop of ileum in the left upper and mid pelvis, distal to an anastomosis in the left mid abdomen. This is compatible mildly progressive thickening associated with the patient's known Crohn's disease. A neoplastic process can not be excluded but is less likely. 2. 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 3. Pronounced bullous changes in the inferior left upper lobe.  Past Medical History:  Diagnosis Date   Acute endocarditis    COVID 05/2021   Crohn's disease ([place])    Depression    Eating disorder    GERD (gastroesophageal reflux disease)    Kidney stones    Short gut syndrome      Past Surgical History:  Procedure Laterality Date   COLONOSCOPY  06/11/2020   COLONOSCOPY  07/05/2018   COLONOSCOPY WITH PROPOFOL N/A 09/17/2017   Procedure: COLONOSCOPY WITH PROPOFOL;  Surgeon: [name], MD;  Location: [facility];  Service: Endoscopy;  Laterality: N/A;  Through ileostomy.   CYSTOSCOPY WITH STENT PLACEMENT Left 05/20/2017   Procedure: CYSTOSCOPY WITH STENT PLACEMENT;  Surgeon: [name], MD;  Location: [facility];  Service: Urology;  Laterality: Left;   ILEOSTOMY     IR NEPHROSTOMY EXCHANGE LEFT  04/12/2017   IR NEPHROSTOMY PLACEMENT LEFT  04/12/2017   IR NEPHROSTOMY PLACEMENT LEFT  04/29/2017   NEPHROLITHOTOMY Left  05/14/2017   Procedure: LEFT NEPHROLITHOTOMY PERCUTANEOUS WITH SURGEON ACCESS;  Surgeon: [name], MD;  Location: [facility];  Service: Urology;  Laterality: Left;   SMALL INTESTINE SURGERY     SUBTOTAL COLECTOMY     Family History  Problem Relation Age of Onset   Heart disease Mother    Diabetes Father    Heart disease Father    Heart attack Father    Melanoma Sister    Crohn's disease Brother    Inflammatory bowel disease Brother    Diabetes Paternal Grandmother    Colon cancer Neg Hx    Rectal cancer Neg Hx    Stomach cancer Neg Hx    Esophageal cancer Neg Hx    Social History   Tobacco Use   Smoking status: Former    Packs/day: 1.00    Years: 20.00    Pack years: 20.00    Types: Cigarettes    Quit date: 08/31/2006  Years since quitting: 14.8   Smokeless tobacco: Never  Vaping Use   Vaping Use: Never used  Substance Use Topics   Alcohol use: No   Drug use: No   Current Outpatient Medications  Medication Sig Dispense Refill   acetaminophen (TYLENOL) 500 MG tablet Take 500 mg by mouth every 6 (six) hours as needed for moderate pain.     diphenoxylate-atropine (LOMOTIL) 2.5-0.025 MG tablet TAKE 1 TABLET 4 TIMES A DAY AS NEEDED FOR DIARRHEA OR LOOSE STOOLS (Patient taking differently: Take 1 tablet by mouth 4 (four) times daily.) 120 tablet 1   Multiple Vitamin (MULTIVITAMIN WITH MINERALS) TABS tablet Take 1 tablet by mouth daily. 30 tablet 3   omeprazole (PRILOSEC) 20 MG capsule Take 1 capsule (20 mg total) by mouth daily. 90 capsule 1   ondansetron (ZOFRAN) 4 MG tablet TAKE 1 TABLET EVERY 8 HOURS AS NEEDED FOR NASUEA OR VOMITING (Patient taking differently: Take 4 mg by mouth every 8 (eight) hours as needed for nausea or vomiting.) 30 tablet 1   potassium chloride (KLOR-CON) 20 MEQ packet Take 20 mEq by mouth daily. 90 each 1   sertraline (ZOLOFT) 50 MG tablet Take 1 tablet (50 mg total) by mouth daily. 90 tablet 1   traMADol (ULTRAM) 50 MG tablet TAKE ONE TABLET  EVERY 8 HOURS AS NEEDED FOR PAIN (Patient taking differently: Take 50 mg by mouth daily.) 90 tablet 0   ustekinumab (STELARA) 90 MG/ML SOSY injection Inject 1 mL (90 mg total) into the skin every 28 (twenty-eight) days. 1 mL 6   Vitamin D, Ergocalciferol, 2000 units CAPS Take 4,000 Units by mouth daily.     Current Facility-Administered Medications  Medication Dose Route Frequency Provider Last Rate Last Admin   cyanocobalamin ((VITAMIN B-12)) injection 1,000 mcg  1,000 mcg Intramuscular Q30 days Jayona Mccaig, Carlota Raspberry, MD   1,000 mcg at 07/10/19 1411   Allergies  Allergen Reactions   Methotrexate Derivatives Other (See Comments)    Anemia, low WBC, severe GI symptoms,    Phenergan [Promethazine Hcl] Other (See Comments)    twitching   Humira [Adalimumab] Other (See Comments)    Intolerance   Penicillins Other (See Comments)    REACTION DURING SURGERY Has patient had a PCN reaction causing immediate rash, facial/tongue/throat swelling, SOB or lightheadedness with hypotension: Unknown Has patient had a PCN reaction causing severe rash involving mucus membranes or skin necrosis: Unknown Has patient had a PCN reaction that required hospitalization: Unknown Has patient had a PCN reaction occurring within the last 10 years: No If all of the above answers are "NO", then may proceed with Cephalosporin use.    Remicade [Infliximab] Other (See Comments)    Intolerance     Review of Systems: All systems reviewed and negative except where noted in HPI.   Lab Results  Component Value Date   WBC 5.7 05/24/2021   HGB 12.0 (L) 05/24/2021   HCT 36.4 (L) 05/24/2021   MCV 103.1 (H) 05/24/2021   PLT 128 (L) 05/24/2021    Lab Results  Component Value Date   CREATININE 1.37 (H) 05/25/2021   BUN 15 05/25/2021   NA 139 05/25/2021   K 4.0 05/25/2021   CL 115 (H) 05/25/2021   CO2 21 (L) 05/25/2021    Lab Results  Component Value Date   ALT 14 05/24/2021   AST 21 05/24/2021   ALKPHOS 48  05/24/2021   BILITOT 0.9 05/24/2021   Lab Results  Component Value Date  VITAMINB12 101 (L) 05/23/2021    Physical Exam: BP 100/60   Pulse 79   Ht 5' 11"  (1.803 m)   Wt 123 lb (55.8 kg)   BMI 17.16 kg/m  Constitutional: Pleasant,well-developed, male in no acute distress. Neurological: Alert and oriented to person place and time. Skin: Skin is warm and dry. No rashes noted. Psychiatric: Normal mood and affect. Behavior is normal.   ASSESSMENT AND PLAN: 60 year old male here for reassessment of following:  Crohn's disease Colostomy in place High risk medication use Vitamin B12 deficiency Vitamin D deficiency GERD Osteoporosis  Complicated Crohn's disease history with multiple resections, component of short gut, failed multiple regimens as outlined above.  Most recently on Stelara monotherapy and generally has done pretty well on this regimen and for the most part has minimized his flares and kept him out of the hospital.  Unfortunately admitted with [place] in September, but the patient has since recovered from that and doing much better.  He states the brand Lomotil works much better than the generic so we will order that for him.  He remains vitamin B-12 deficient despite oral supplementation at home, although was not taking a high enough dose with oral formulation.  Given his Crohn's disease I think he would be best served with IM supplementation long-term, he is comfortable doing this, will start 1000 mcg once monthly IM.  He will continue his vitamin D supplementation.  He will continue his Stelara.  He has failed Pepcid/H2 blockers for his GERD and on low-dose omeprazole is quite happy with the regimen.  He understands long-term risks of omeprazole to include increased risk for bone fracture.  Unfortunately he does have osteoporosis.  He is aware of this and I had recommended that he see his primary care to determine if he needs to see a specialist, he has not done this yet.  I  recommend he contact his primary care soon as possible to discuss his osteoporosis.  Plan: - continue Stelara - Quantiferon gold test today - Flu shot today - Continue vitamin D as currently dosed - 4000 IU / day - continue brand Lomotil  - start B12 injections - 1031mg once month IM - refill omeprazole 257m/ day - needs to see his PCP about osteoporosis - f/u 6 months  StJolly MangoMD [facility]

## 2021-07-13 LAB — QUANTIFERON-TB GOLD PLUS
Mitogen-NIL: >10 IU/mL
NIL: 0.06 IU/mL
QuantiFERON-TB Gold Plus: NEGATIVE
TB1-NIL: 0 IU/mL
TB2-NIL: 0 IU/mL

## 2021-07-14 ENCOUNTER — Ambulatory Visit (INDEPENDENT_AMBULATORY_CARE_PROVIDER_SITE_OTHER): Payer: Medicare Other

## 2021-07-14 ENCOUNTER — Other Ambulatory Visit: Payer: Self-pay

## 2021-07-14 ENCOUNTER — Ambulatory Visit (INDEPENDENT_AMBULATORY_CARE_PROVIDER_SITE_OTHER): Payer: Medicare Other | Admitting: Family Medicine

## 2021-07-14 VITALS — BP 98/72 | HR 64 | Temp 98.5°F | Wt 129.8 lb

## 2021-07-14 DIAGNOSIS — K50819 Crohn's disease of both small and large intestine with unspecified complications: Secondary | ICD-10-CM | POA: Diagnosis not present

## 2021-07-14 DIAGNOSIS — N529 Male erectile dysfunction, unspecified: Secondary | ICD-10-CM

## 2021-07-14 DIAGNOSIS — M25561 Pain in right knee: Secondary | ICD-10-CM

## 2021-07-14 DIAGNOSIS — M25562 Pain in left knee: Secondary | ICD-10-CM | POA: Diagnosis not present

## 2021-07-14 DIAGNOSIS — G8929 Other chronic pain: Secondary | ICD-10-CM | POA: Diagnosis not present

## 2021-07-14 DIAGNOSIS — R636 Underweight: Secondary | ICD-10-CM

## 2021-07-14 DIAGNOSIS — M818 Other osteoporosis without current pathological fracture: Secondary | ICD-10-CM | POA: Diagnosis not present

## 2021-07-14 MED ORDER — SILDENAFIL CITRATE 25 MG PO TABS: 12.5000 mg | ORAL_TABLET | Freq: Every day | ORAL | 0 refills | Status: DC | PRN

## 2021-07-14 NOTE — Progress Notes (Signed)
Subjective:    Patient ID: Gregory Hamper., male    DOB: [birth year], 60 y.o.   MRN: [id number]  Chief Complaint  Patient presents with   Osteoporosis    Wants to discuss treatment, knees are hurting really bad going on a month.    HPI Patient was seen today for follow-up on ongoing concerns and chronic medical conditions.  Patient endorses bilateral knee pain worse in the last month.  States he had osteoporosis noted on recent bone density.  Taking calcium and vitamin D but having difficulty absorbing 2/2 history of Crohn's and extensive surgeries.  Recently started IM vit B12.  Plans on starting protein powder to help with weight.  Eating small meals throughout the day.  Dairy often worsens Crohn's symptoms.  Pt having ED. Has a new friend and inquires about medication to help with symptoms.  Unable to achieve erection with partner, has not tried by himself, no morning erection.  Past Medical History:  Diagnosis Date   Acute endocarditis    COVID 05/2021   Crohn's disease ([place])    Depression    Eating disorder    GERD (gastroesophageal reflux disease)    Kidney stones    Short gut syndrome     Allergies  Allergen Reactions   Methotrexate Derivatives Other (See Comments)    Anemia, low WBC, severe GI symptoms,    Phenergan [Promethazine Hcl] Other (See Comments)    twitching   Humira [Adalimumab] Other (See Comments)    Intolerance   Penicillins Other (See Comments)    REACTION DURING SURGERY Has patient had a PCN reaction causing immediate rash, facial/tongue/throat swelling, SOB or lightheadedness with hypotension: Unknown Has patient had a PCN reaction causing severe rash involving mucus membranes or skin necrosis: Unknown Has patient had a PCN reaction that required hospitalization: Unknown Has patient had a PCN reaction occurring within the last 10 years: No If all of the above answers are "NO", then may proceed with Cephalosporin use.    Remicade [Infliximab] Other  (See Comments)    Intolerance    ROS General: Denies fever, chills, night sweats, changes in appetite +weight change HEENT: Denies headaches, ear pain, changes in vision, rhinorrhea, sore throat CV: Denies CP, palpitations, SOB, orthopnea Pulm: Denies SOB, cough, wheezing GI: Denies abdominal pain, nausea, vomiting, diarrhea, constipation GU: Denies dysuria, hematuria, frequency +ED Msk: Denies muscle cramps, joint pains  +b/l knee pain Neuro: Denies weakness, numbness, tingling Skin: Denies rashes, bruising Psych: Denies depression, anxiety, hallucinations    Objective:    Blood pressure 98/72, pulse 64, temperature 98.5 F (36.9 C), temperature source Oral, weight 129 lb 12.8 oz (58.9 kg), SpO2 97 %.  Gen. Pleasant, thin, in no distress, normal affect   HEENT: [place]/AT, face symmetric, conjunctiva clear, no scleral icterus, PERRLA, EOMI, nares patent without drainage Lungs: no accessory muscle use, CTAB, no wheezes or rales Cardiovascular: RRR, no m/r/g, no peripheral edema Abdomen: BS present, soft, NT/ND, no hepatosplenomegaly. Musculoskeletal: No deformities, no cyanosis or clubbing, normal tone Neuro:  A&Ox3, CN II-XII intact, normal gait Skin:  Warm, no lesions/ rash   Wt Readings from Last 3 Encounters:  07/14/21 129 lb 12.8 oz (58.9 kg)  07/10/21 123 lb (55.8 kg)  05/23/21 119 lb 4.3 oz (54.1 kg)    Lab Results  Component Value Date   WBC 5.7 05/24/2021   HGB 12.0 (L) 05/24/2021   HCT 36.4 (L) 05/24/2021   PLT 128 (L) 05/24/2021   GLUCOSE 95 05/25/2021  ALT 14 05/24/2021   AST 21 05/24/2021   NA 139 05/25/2021   K 4.0 05/25/2021   CL 115 (H) 05/25/2021   CREATININE 1.37 (H) 05/25/2021   BUN 15 05/25/2021   CO2 21 (L) 05/25/2021   TSH 0.611 12/20/2019   INR 1.18 05/18/2017   HGBA1C 5.7 (H) 12/20/2019    Assessment/Plan:  Other osteoporosis without current pathological fracture -2/2 malabsorption from h/o Crohn's dz. -noted on bone density scan  12/17/20.  T score RFN -3.2, LFN -2.8 -discussed r/b/a of bisphosphonates 2/2 h/o GERD -maximize po intake of vit D and calcium - Plan: Ambulatory referral to Endocrinology  Chronic pain of both knees  -supportive care including heat, topical analgesics -further recs based on imaging - Plan: DG Knee Complete 4 Views Left  Erectile dysfunction, unspecified erectile dysfunction type  -advised to increase hydration.  Discussed sildenafil may cause hypotension -given strict precautions - Plan: sildenafil (VIAGRA) 25 MG tablet  Crohn's disease of both small and lg int w unsp comp ([place])  -stable -continue diet modifications -continue f/u with GI -given difficulty with absorption of vitamins and minerals will place referral to Endo. - Plan: Ambulatory referral to Endocrinology  Underweight -currently 129 lbs -discussed high protein diet -snacking encouraged - Plan: Ambulatory referral to Endocrinology  Hypocalcemia  -increase po intake - Plan: Ambulatory referral to Endocrinology  F/u in 1 month  Grier Mitts, MD

## 2021-07-30 ENCOUNTER — Encounter: Payer: Self-pay | Admitting: Family Medicine

## 2021-08-11 ENCOUNTER — Ambulatory Visit (INDEPENDENT_AMBULATORY_CARE_PROVIDER_SITE_OTHER): Payer: Medicare Other | Admitting: Family Medicine

## 2021-08-11 ENCOUNTER — Encounter: Payer: Self-pay | Admitting: Family Medicine

## 2021-08-11 VITALS — BP 116/68 | HR 79 | Temp 98.5°F | Wt 129.0 lb

## 2021-08-11 DIAGNOSIS — M818 Other osteoporosis without current pathological fracture: Secondary | ICD-10-CM

## 2021-08-11 DIAGNOSIS — R636 Underweight: Secondary | ICD-10-CM | POA: Diagnosis not present

## 2021-08-11 DIAGNOSIS — N529 Male erectile dysfunction, unspecified: Secondary | ICD-10-CM | POA: Diagnosis not present

## 2021-08-11 DIAGNOSIS — K50819 Crohn's disease of both small and large intestine with unspecified complications: Secondary | ICD-10-CM

## 2021-08-11 MED ORDER — SILDENAFIL CITRATE 25 MG PO TABS: 12.5000 mg | ORAL_TABLET | Freq: Every day | ORAL | 4 refills | Status: DC | PRN

## 2021-08-11 NOTE — Patient Instructions (Addendum)
[facility], [id number],  [place], [place] [address], Phone [phone] in regards to scheduling an appointment for treatment options for osteoporosis.  The referral placed at our last visit was approved.  A refill was sent to your pharmacy.

## 2021-08-11 NOTE — Progress Notes (Signed)
Subjective:    Patient ID: Gregory [name]., male    DOB: 1960-10-28, 60 y.o.   MRN: [id number]  Chief Complaint  Patient presents with   Follow-up    HPI Patient was seen today for f/u on ongoing conditions.  Pt states his knees are feeling better after using biofreeze. It does not completely take away the pain but decreases it.  Pt tried sildenafil 12.5 mg, but did not achieve erection until taking 25 mg.  Denies dizziness, lightheadedness, hypotension. Pt has not heard about referral to Endo regarding osteoporosis options.  Walking for exercise.  Currently on stellara for crohn's dz.  States increasing intake of lactaid free milk.   States starting to feel much better.Was unsure how to draw up B12 injections so has not been taking.   Has upcoming AWV with nurse, would like to make sure this is a telephone visit.  Pt's dog recently had a CVA. Past Medical History:  Diagnosis Date   Acute endocarditis    COVID 05/2021   Crohn's disease ([place])    Depression    Eating disorder    GERD (gastroesophageal reflux disease)    Kidney stones    Short gut syndrome     Allergies  Allergen Reactions   Methotrexate Derivatives Other (See Comments)    Anemia, low WBC, severe GI symptoms,    Phenergan [Promethazine Hcl] Other (See Comments)    twitching   Humira [Adalimumab] Other (See Comments)    Intolerance   Penicillins Other (See Comments)    REACTION DURING SURGERY Has patient had a PCN reaction causing immediate rash, facial/tongue/throat swelling, SOB or lightheadedness with hypotension: Unknown Has patient had a PCN reaction causing severe rash involving mucus membranes or skin necrosis: Unknown Has patient had a PCN reaction that required hospitalization: Unknown Has patient had a PCN reaction occurring within the last 10 years: No If all of the above answers are "NO", then may proceed with Cephalosporin use.    Remicade [Infliximab] Other (See Comments)    Intolerance     ROS General: Denies fever, chills, night sweats, changes in appetite  + changes in weight HEENT: Denies headaches, ear pain, changes in vision, rhinorrhea, sore throat CV: Denies CP, palpitations, SOB, orthopnea Pulm: Denies SOB, cough, wheezing GI: Denies nausea, vomiting, diarrhea, constipation +Crohn's dz GU: Denies dysuria, hematuria, frequency +ED Msk: Denies muscle cramps, joint pains Neuro: Denies weakness, numbness, tingling Skin: Denies rashes, bruising Psych: Denies depression, anxiety, hallucinations    Objective:    Blood pressure 116/68, pulse 79, temperature 98.5 F (36.9 C), temperature source Oral, weight 129 lb (58.5 kg), SpO2 97 %.   Gen. Pleasant, well-nourished, in no distress, normal affect   HEENT: [place]/AT, face symmetric, conjunctiva clear, no scleral icterus, PERRLA, EOMI, nares patent without drainage Lungs: no accessory muscle use Cardiovascular: RRR, no peripheral edema Musculoskeletal: No deformities, no cyanosis or clubbing, normal tone Neuro:  A&Ox3, CN II-XII intact, normal gait Skin:  Warm, no lesions/ rash   Wt Readings from Last 3 Encounters:  08/11/21 129 lb (58.5 kg)  07/14/21 129 lb 12.8 oz (58.9 kg)  07/10/21 123 lb (55.8 kg)    Lab Results  Component Value Date   WBC 5.7 05/24/2021   HGB 12.0 (L) 05/24/2021   HCT 36.4 (L) 05/24/2021   PLT 128 (L) 05/24/2021   GLUCOSE 95 05/25/2021   ALT 14 05/24/2021   AST 21 05/24/2021   NA 139 05/25/2021   K 4.0 05/25/2021  CL 115 (H) 05/25/2021   CREATININE 1.37 (H) 05/25/2021   BUN 15 05/25/2021   CO2 21 (L) 05/25/2021   TSH 0.611 12/20/2019   INR 1.18 05/18/2017   HGBA1C 5.7 (H) 12/20/2019    Assessment/Plan:  Other osteoporosis without current pathological fracture -Continue increasing dietary intake of calcium and vitamin D -Weightbearing exercises -Discussed various medication options.  Hesitant to use bisphosphonates 2/2 history of Crohn's and GERD. -Referral placed at  last OFV per endocrinology.  Patient advised to contact office regarding appointment.  Given information.  Erectile dysfunction, unspecified erectile dysfunction type -continue to monitor bp  - Plan: sildenafil (VIAGRA) 25 MG tablet  Crohn's disease of both small and lg int w unsp comp ([place]) -stable -educated on B12 injections -continue [place] f/u with GI  Underweight -stable -continue frequent meals and snacks -increase intake of protein -continue to monitor  F/u prn in 3 months, sooner if needed.    Grier Mitts, MD

## 2021-08-19 ENCOUNTER — Ambulatory Visit (INDEPENDENT_AMBULATORY_CARE_PROVIDER_SITE_OTHER): Payer: Medicare Other

## 2021-08-19 VITALS — Ht 71.0 in | Wt 132.0 lb

## 2021-08-19 DIAGNOSIS — Z0000 Encounter for general adult medical examination without abnormal findings: Secondary | ICD-10-CM

## 2021-08-19 NOTE — Patient Instructions (Signed)
Mr. Gregory [name] , Thank you for taking time to come for your Medicare Wellness Visit. I appreciate your ongoing commitment to your health goals. Please review the following plan we discussed and let me know if I can assist you in the future.   Screening recommendations/referrals: Colonoscopy: completed 06/11/2020 Recommended yearly ophthalmology/optometry visit for glaucoma screening and checkup Recommended yearly dental visit for hygiene and checkup  Vaccinations: Influenza vaccine: completed 07/10/2021 Pneumococcal vaccine: completed 04/10/2019 Tdap vaccine: completed 09/01/2011, due 08/31/2021 Shingles vaccine: completed   Covid-19:  12/10/2020, 06/26/2020, 01/02/2020, 12/05/2019  Advanced directives: Please bring a copy of your POA (Power of Attorney) and/or Living Will to your next appointment.   Conditions/risks identified: none  Next appointment: Follow up in one year for your annual wellness visit   Preventive Care 40-64 Years, Male Preventive care refers to lifestyle choices and visits with your health care provider that can promote health and wellness. What does preventive care include? A yearly physical exam. This is also called an annual well check. Dental exams once or twice a year. Routine eye exams. Ask your health care provider how often you should have your eyes checked. Personal lifestyle choices, including: Daily care of your teeth and gums. Regular physical activity. Eating a healthy diet. Avoiding tobacco and drug use. Limiting alcohol use. Practicing safe sex. Taking low-dose aspirin every day starting at age 52. What happens during an annual well check? The services and screenings done by your health care provider during your annual well check will depend on your age, overall health, lifestyle risk factors, and family history of disease. Counseling  Your health care provider may ask you questions about your: Alcohol use. Tobacco use. Drug use. Emotional  well-being. Home and relationship well-being. Sexual activity. Eating habits. Work and work Statistician. Screening  You may have the following tests or measurements: Height, weight, and BMI. Blood pressure. Lipid and cholesterol levels. These may be checked every 5 years, or more frequently if you are over 30 years old. Skin check. Lung cancer screening. You may have this screening every year starting at age 54 if you have a 30-pack-year history of smoking and currently smoke or have quit within the past 15 years. Fecal occult blood test (FOBT) of the stool. You may have this test every year starting at age 24. Flexible sigmoidoscopy or colonoscopy. You may have a sigmoidoscopy every 5 years or a colonoscopy every 10 years starting at age 77. Prostate cancer screening. Recommendations will vary depending on your family history and other risks. Hepatitis C blood test. Hepatitis B blood test. Sexually transmitted disease (STD) testing. Diabetes screening. This is done by checking your blood sugar (glucose) after you have not eaten for a while (fasting). You may have this done every 1-3 years. Discuss your test results, treatment options, and if necessary, the need for more tests with your health care provider. Vaccines  Your health care provider may recommend certain vaccines, such as: Influenza vaccine. This is recommended every year. Tetanus, diphtheria, and acellular pertussis (Tdap, Td) vaccine. You may need a Td booster every 10 years. Zoster vaccine. You may need this after age 61. Pneumococcal 13-valent conjugate (PCV13) vaccine. You may need this if you have certain conditions and have not been vaccinated. Pneumococcal polysaccharide (PPSV23) vaccine. You may need one or two doses if you smoke cigarettes or if you have certain conditions. Talk to your health care provider about which screenings and vaccines you need and how often you need them. This information is  not intended to  replace advice given to you by your health care provider. Make sure you discuss any questions you have with your health care provider. Document Released: 09/13/2015 Document Revised: 05/06/2016 Document Reviewed: 06/18/2015 Elsevier Interactive Patient Education  2017 [place] Prevention in the Home Falls can cause injuries. They can happen to people of all ages. There are many things you can do to make your home safe and to help prevent falls. What can I do on the outside of my home? Regularly fix the edges of walkways and driveways and fix any cracks. Remove anything that might make you trip as you walk through a door, such as a raised step or threshold. Trim any bushes or trees on the path to your home. Use bright outdoor lighting. Clear any walking paths of anything that might make someone trip, such as rocks or tools. Regularly check to see if handrails are loose or broken. Make sure that both sides of any steps have handrails. Any raised decks and porches should have guardrails on the edges. Have any leaves, snow, or ice cleared regularly. Use sand or salt on walking paths during winter. Clean up any spills in your garage right away. This includes oil or grease spills. What can I do in the bathroom? Use night lights. Install grab bars by the toilet and in the tub and shower. Do not use towel bars as grab bars. Use non-skid mats or decals in the tub or shower. If you need to sit down in the shower, use a plastic, non-slip stool. Keep the floor dry. Clean up any water that spills on the floor as soon as it happens. Remove soap buildup in the tub or shower regularly. Attach bath mats securely with double-sided non-slip rug tape. Do not have throw rugs and other things on the floor that can make you trip. What can I do in the bedroom? Use night lights. Make sure that you have a light by your bed that is easy to reach. Do not use any sheets or blankets that are too big for  your bed. They should not hang down onto the floor. Have a firm chair that has side arms. You can use this for support while you get dressed. Do not have throw rugs and other things on the floor that can make you trip. What can I do in the kitchen? Clean up any spills right away. Avoid walking on wet floors. Keep items that you use a lot in easy-to-reach places. If you need to reach something above you, use a strong step stool that has a grab bar. Keep electrical cords out of the way. Do not use floor polish or wax that makes floors slippery. If you must use wax, use non-skid floor wax. Do not have throw rugs and other things on the floor that can make you trip. What can I do with my stairs? Do not leave any items on the stairs. Make sure that there are handrails on both sides of the stairs and use them. Fix handrails that are broken or loose. Make sure that handrails are as long as the stairways. Check any carpeting to make sure that it is firmly attached to the stairs. Fix any carpet that is loose or worn. Avoid having throw rugs at the top or bottom of the stairs. If you do have throw rugs, attach them to the floor with carpet tape. Make sure that you have a light switch at the top of the  stairs and the bottom of the stairs. If you do not have them, ask someone to add them for you. What else can I do to help prevent falls? Wear shoes that: Do not have high heels. Have rubber bottoms. Are comfortable and fit you well. Are closed at the toe. Do not wear sandals. If you use a stepladder: Make sure that it is fully opened. Do not climb a closed stepladder. Make sure that both sides of the stepladder are locked into place. Ask someone to hold it for you, if possible. Clearly mark and make sure that you can see: Any grab bars or handrails. First and last steps. Where the edge of each step is. Use tools that help you move around (mobility aids) if they are needed. These  include: Canes. Walkers. Scooters. Crutches. Turn on the lights when you go into a dark area. Replace any light bulbs as soon as they burn out. Set up your furniture so you have a clear path. Avoid moving your furniture around. If any of your floors are uneven, fix them. If there are any pets around you, be aware of where they are. Review your medicines with your doctor. Some medicines can make you feel dizzy. This can increase your chance of falling. Ask your doctor what other things that you can do to help prevent falls. This information is not intended to replace advice given to you by your health care provider. Make sure you discuss any questions you have with your health care provider. Document Released: 06/13/2009 Document Revised: 01/23/2016 Document Reviewed: 09/21/2014 Elsevier Interactive Patient Education  2017 [employer].

## 2021-08-19 NOTE — Progress Notes (Signed)
I connected with Gregory [name] today by telephone and verified that I am speaking with the correct person using two identifiers. Location patient: home Location provider: work Persons participating in the virtual visit: [place], Kreuser LPN.   I discussed the limitations, risks, security and privacy concerns of performing an evaluation and management service by telephone and the availability of in person appointments. I also discussed with the patient that there may be a patient responsible charge related to this service. The patient expressed understanding and verbally consented to this telephonic visit.    Interactive audio and video telecommunications were attempted between this provider and patient, however failed, due to patient having technical difficulties OR patient did not have access to video capability.  We continued and completed visit with audio only.     Vital signs may be patient reported or missing.  Subjective:   Gregory [name]. is a 60 y.o. male who presents for Medicare Annual/Subsequent preventive examination.  Review of Systems     Cardiac Risk Factors include: advanced age (>57mn, >>58women)     Objective:    Today's Vitals   08/19/21 1331  Weight: 132 lb (59.9 kg)  Height: 5' 11"  (1.803 m)   Body mass index is 18.41 kg/m.  Advanced Directives 08/19/2021 05/22/2021 08/13/2020 12/19/2019 12/19/2019 01/17/2018 09/15/2017  Does Patient Have a Medical Advance Directive? Yes No Yes Yes Yes Yes No  Type of AParamedicof AMount HoodLiving will - Living will HMorrisLiving will HBaysideLiving will HCentralLiving will -  Does patient want to make changes to medical advance directive? - - - No - Patient declined - No - Patient declined -  Copy of HC[place]in Chart? No - copy requested - - No - copy requested - No - copy requested -  Would patient like  information on creating a medical advance directive? - No - Patient declined - - - - No - Patient declined    Current Medications (verified) Outpatient Encounter Medications as of 08/19/2021  Medication Sig   acetaminophen (TYLENOL) 500 MG tablet Take 500 mg by mouth every 6 (six) hours as needed for moderate pain.   cyanocobalamin (,VITAMIN B-12,) 1000 MCG/ML injection Inject 1 mL (1,000 mcg total) into the muscle every 30 (thirty) days.   LOMOTIL 2.5-0.025 MG tablet Take 1 tablet by mouth 4 (four) times daily as needed for diarrhea or loose stools.   Multiple Vitamin (MULTIVITAMIN WITH MINERALS) TABS tablet Take 1 tablet by mouth daily.   omeprazole (PRILOSEC) 20 MG capsule Take 1 capsule (20 mg total) by mouth daily.   ondansetron (ZOFRAN) 4 MG tablet TAKE 1 TABLET EVERY 8 HOURS AS NEEDED FOR NASUEA OR VOMITING (Patient taking differently: Take 4 mg by mouth every 8 (eight) hours as needed for nausea or vomiting.)   potassium chloride (KLOR-CON) 20 MEQ packet Take 20 mEq by mouth daily.   sertraline (ZOLOFT) 50 MG tablet Take 1 tablet (50 mg total) by mouth daily.   sildenafil (VIAGRA) 25 MG tablet Take 0.5 tablets (12.5 mg total) by mouth daily as needed for erectile dysfunction.   traMADol (ULTRAM) 50 MG tablet TAKE ONE TABLET EVERY 8 HOURS AS NEEDED FOR PAIN (Patient taking differently: Take 50 mg by mouth daily.)   ustekinumab (STELARA) 90 MG/ML SOSY injection Inject 1 mL (90 mg total) into the skin every 28 (twenty-eight) days.   Vitamin D, Ergocalciferol, 2000 units CAPS Take  4,000 Units by mouth daily.   No facility-administered encounter medications on file as of 08/19/2021.    Allergies (verified) Methotrexate derivatives, Phenergan [promethazine hcl], Humira [adalimumab], Penicillins, and Remicade [infliximab]   History: Past Medical History:  Diagnosis Date   Acute endocarditis    COVID 05/2021   Crohn's disease ([place])    Depression    Eating disorder    GERD  (gastroesophageal reflux disease)    Kidney stones    Short gut syndrome    Past Surgical History:  Procedure Laterality Date   COLONOSCOPY  06/11/2020   COLONOSCOPY  07/05/2018   COLONOSCOPY WITH PROPOFOL N/A 09/17/2017   Procedure: COLONOSCOPY WITH PROPOFOL;  Surgeon: [name], MD;  Location: [facility];  Service: Endoscopy;  Laterality: N/A;  Through ileostomy.   CYSTOSCOPY WITH STENT PLACEMENT Left 05/20/2017   Procedure: CYSTOSCOPY WITH STENT PLACEMENT;  Surgeon: [name], MD;  Location: [facility];  Service: Urology;  Laterality: Left;   ILEOSTOMY     IR NEPHROSTOMY EXCHANGE LEFT  04/12/2017   IR NEPHROSTOMY PLACEMENT LEFT  04/12/2017   IR NEPHROSTOMY PLACEMENT LEFT  04/29/2017   NEPHROLITHOTOMY Left 05/14/2017   Procedure: LEFT NEPHROLITHOTOMY PERCUTANEOUS WITH SURGEON ACCESS;  Surgeon: [name], MD;  Location: [facility];  Service: Urology;  Laterality: Left;   SMALL INTESTINE SURGERY     SUBTOTAL COLECTOMY     Family History  Problem Relation Age of Onset   Heart disease Mother    Diabetes Father    Heart disease Father    Heart attack Father    Melanoma Sister    Crohn's disease Brother    Inflammatory bowel disease Brother    Diabetes Paternal Grandmother    Colon cancer Neg Hx    Rectal cancer Neg Hx    Stomach cancer Neg Hx    Esophageal cancer Neg Hx    Social History   Socioeconomic History   Marital status: Divorced    Spouse name: Not on file   Number of children: 2   Years of education: Not on file   Highest education level: Not on file  Occupational History   Occupation: Retired  Tobacco Use   Smoking status: Former    Packs/day: 1.00    Years: 20.00    Pack years: 20.00    Types: Cigarettes    Quit date: 08/31/2006    Years since quitting: 14.9   Smokeless tobacco: Never  Vaping Use   Vaping Use: Never used  Substance and Sexual Activity   Alcohol use: No   Drug use: No   Sexual activity: Not Currently  Other Topics  Concern   Not on file  Social History Narrative   Not on file   Social Determinants of Health   Financial Resource Strain: Low Risk    Difficulty of Paying Living Expenses: Not hard at all  Food Insecurity: No Food Insecurity   Worried About Charity fundraiser in the Last Year: Never true   [place] in the Last Year: Never true  Transportation Needs: No Transportation Needs   Lack of Transportation (Medical): No   Lack of Transportation (Non-Medical): No  Physical Activity: Insufficiently Active   Days of Exercise per Week: 3 days   Minutes of Exercise per Session: 10 min  Stress: No Stress Concern Present   Feeling of Stress : Not at all  Social Connections: Not on file    Tobacco Counseling Counseling given: Not Answered  Clinical Intake:  Pre-visit preparation completed: Yes  Pain : No/denies pain     Nutritional Status: BMI <19  Underweight Nutritional Risks: None Diabetes: No  How often do you need to have someone help you when you read instructions, pamphlets, or other written materials from your doctor or pharmacy?: 1 - Never What is the last grade level you completed in school?: 12th grade  Diabetic? no  Interpreter Needed?: No  Information entered by :: NAllen LPN   Activities of Daily Living In your present state of health, do you have any difficulty performing the following activities: 08/19/2021 05/22/2021  Hearing? N N  Vision? N N  Difficulty concentrating or making decisions? N N  Walking or climbing stairs? N Y  Dressing or bathing? N N  Doing errands, shopping? N N  Preparing Food and eating ? N -  Using the Toilet? N -  In the past six months, have you accidently leaked urine? N -  Do you have problems with loss of bowel control? N -  Managing your Medications? N -  Managing your Finances? N -  Housekeeping or managing your Housekeeping? N -  Some recent data might be hidden    Patient Care Team: Billie Ruddy, MD as PCP  - General (Family Medicine)  Indicate any recent Medical Services you may have received from other than [facility] providers in the past year (date may be approximate).     Assessment:   This is a routine wellness examination for [place].  Hearing/Vision screen Vision Screening - Comments:: No regular eye exams  Dietary issues and exercise activities discussed: Current Exercise Habits: Home exercise routine, Type of exercise: walking, Time (Minutes): 10, Frequency (Times/Week): 3, Weekly Exercise (Minutes/Week): 30   Goals Addressed             This Visit's Progress    Patient Stated       08/19/2021, trying to have a diet that he can tolerate       Depression Screen PHQ 2/9 Scores 08/19/2021 08/11/2021 08/13/2020 05/11/2017  PHQ - 2 Score 0 0 0 0  PHQ- 9 Score - 3 - -    Fall Risk Fall Risk  08/19/2021 08/11/2021 08/13/2020 05/11/2017  Falls in the past year? 0 0 0 No  Number falls in past yr: - - 0 -  Injury with Fall? - - 0 -  Risk for fall due to : Medication side effect - Impaired vision;Impaired balance/gait -  Follow up Falls evaluation completed;Education provided;Falls prevention discussed - Falls prevention discussed -    FALL RISK PREVENTION PERTAINING TO THE HOME:  Any stairs in or around the home? Yes  If so, are there any without handrails? No  Home free of loose throw rugs in walkways, pet beds, electrical cords, etc? Yes  Adequate lighting in your home to reduce risk of falls? Yes   ASSISTIVE DEVICES UTILIZED TO PREVENT FALLS:  Life alert? No  Use of a cane, walker or w/c? Yes  Grab bars in the bathroom? Yes  Shower chair or bench in shower? Yes  Elevated toilet seat or a handicapped toilet? No   TIMED UP AND GO:  Was the test performed? No .      Cognitive Function:     6CIT Screen 08/19/2021 08/13/2020  What Year? 0 points 0 points  What month? 0 points 0 points  What time? 0 points -  Count back from 20 0 points 0 points  Months in  reverse 0 points 0 points  Repeat phrase 2 points 0 points  Total Score 2 -    Immunizations Immunization History  Administered Date(s) Administered   Influenza,inj,Quad PF,6+ Mos 05/11/2017, 05/03/2019, 05/28/2020, 07/10/2021   Influenza-Unspecified 06/01/2018   Moderna SARS-COV2 Booster Vaccination 12/10/2020   Moderna Sars-Covid-2 Vaccination 12/05/2019, 01/02/2020, 06/26/2020   Pneumococcal Conjugate-13 01/10/2018   Pneumococcal Polysaccharide-23 04/10/2019   Zoster Recombinat (Shingrix) 06/28/2019, 09/05/2019    TDAP status: Up to date  Flu Vaccine status: Up to date  Pneumococcal vaccine status: Up to date  Covid-19 vaccine status: Completed vaccines  Qualifies for Shingles Vaccine? Yes   Zostavax completed No   Shingrix Completed?: Yes  Screening Tests Health Maintenance  Topic Date Due   COVID-19 Vaccine (4 - Booster for Moderna series) 02/04/2021   TETANUS/TDAP  08/31/2021   Pneumococcal Vaccine 59-17 Years old (3 - PPSV23 if available, else PCV20) 04/09/2024   COLONOSCOPY (Pts 45-76yr Insurance coverage will need to be confirmed)  06/11/2030   INFLUENZA VACCINE  Completed   Hepatitis C Screening  Completed   HIV Screening  Completed   Zoster Vaccines- Shingrix  Completed   HPV VACCINES  Aged Out    Health Maintenance  Health Maintenance Due  Topic Date Due   COVID-19 Vaccine (4 - Booster for Moderna series) 02/04/2021    Colorectal cancer screening: Type of screening: Colonoscopy. Completed 06/11/2020. Repeat every 3-5 years  Lung Cancer Screening: (Low Dose CT Chest recommended if Age 60-80years, 30 pack-year currently smoking OR have quit w/in 15years.) does not qualify.   Lung Cancer Screening Referral: no  Additional Screening:  Hepatitis C Screening: does qualify; Completed 09/01/2015  Vision Screening: Recommended annual ophthalmology exams for early detection of glaucoma and other disorders of the eye. Is the patient up to date with their  annual eye exam?  No  Who is the provider or what is the name of the office in which the patient attends annual eye exams? none If pt is not established with a provider, would they like to be referred to a provider to establish care? No .   Dental Screening: Recommended annual dental exams for proper oral hygiene  Community Resource Referral / Chronic Care Management: CRR required this visit?  No   CCM required this visit?  No      Plan:     I have personally reviewed and noted the following in the patients chart:   Medical and social history Use of alcohol, tobacco or illicit drugs  Current medications and supplements including opioid prescriptions. Patient is currently taking opioid prescriptions. Information provided to patient regarding non-opioid alternatives. Patient advised to discuss non-opioid treatment plan with their provider. Functional ability and status Nutritional status Physical activity Advanced directives List of other physicians Hospitalizations, surgeries, and ER visits in previous 12 months Vitals Screenings to include cognitive, depression, and falls Referrals and appointments  In addition, I have reviewed and discussed with patient certain preventive protocols, quality metrics, and best practice recommendations. A written personalized care plan for preventive services as well as general preventive health recommendations were provided to patient.     NKellie Simmering LPN   [id number]  Nurse Notes: none

## 2021-09-22 ENCOUNTER — Telehealth: Payer: Self-pay

## 2021-09-22 ENCOUNTER — Encounter: Payer: Self-pay | Admitting: Gastroenterology

## 2021-09-22 NOTE — Telephone Encounter (Signed)
Left message for patient to please call back

## 2021-09-22 NOTE — Telephone Encounter (Signed)
Patient is returning your call.  

## 2021-09-22 NOTE — Telephone Encounter (Signed)
Spoke to patient and he states he sent more information by MyChart about a new pharmacy filling his Stelara. Will F/U on this

## 2021-09-23 ENCOUNTER — Telehealth: Payer: Self-pay

## 2021-09-23 DIAGNOSIS — K50819 Crohn's disease of both small and large intestine with unspecified complications: Secondary | ICD-10-CM

## 2021-09-23 DIAGNOSIS — Z79899 Other long term (current) drug therapy: Secondary | ICD-10-CM

## 2021-09-23 MED ORDER — STELARA 90 MG/ML ~~LOC~~ SOSY: 90.0000 mg | PREFILLED_SYRINGE | SUBCUTANEOUS | 6 refills | Status: DC

## 2021-09-23 NOTE — Telephone Encounter (Signed)
See my chart message from patient. He states that [facility] is no longer filling his Stelara. New pharmacy is [facility]. I spoke with Andee Poles at [facility] , she has provided me with the new pharmacy information.   [place] ([phone], F: [phone])

## 2021-09-25 ENCOUNTER — Other Ambulatory Visit: Payer: Self-pay | Admitting: Gastroenterology

## 2021-09-29 ENCOUNTER — Encounter: Payer: Self-pay | Admitting: Gastroenterology

## 2021-09-29 DIAGNOSIS — Z79899 Other long term (current) drug therapy: Secondary | ICD-10-CM

## 2021-09-29 DIAGNOSIS — E538 Deficiency of other specified B group vitamins: Secondary | ICD-10-CM

## 2021-09-29 DIAGNOSIS — K50819 Crohn's disease of both small and large intestine with unspecified complications: Secondary | ICD-10-CM

## 2021-09-29 DIAGNOSIS — E559 Vitamin D deficiency, unspecified: Secondary | ICD-10-CM

## 2021-09-29 NOTE — Telephone Encounter (Signed)
Called and spoke with [name] at [facility]. He states that he had to update patient's profile, he states that they will contact patient this week to set up shipment of his medication.

## 2021-09-29 NOTE — Telephone Encounter (Signed)
-----   Message from Roetta Sessions, [place] sent at 04/04/2021  5:22 PM EDT ----- Regarding: cbc and  cmet due Cbc and cmet due 10-2021

## 2021-10-02 ENCOUNTER — Other Ambulatory Visit: Payer: Self-pay

## 2021-10-02 ENCOUNTER — Encounter: Payer: Self-pay | Admitting: Internal Medicine

## 2021-10-02 ENCOUNTER — Other Ambulatory Visit (INDEPENDENT_AMBULATORY_CARE_PROVIDER_SITE_OTHER): Payer: Medicare Other

## 2021-10-02 ENCOUNTER — Ambulatory Visit (INDEPENDENT_AMBULATORY_CARE_PROVIDER_SITE_OTHER): Payer: Medicare Other | Admitting: Internal Medicine

## 2021-10-02 VITALS — BP 100/70 | HR 79 | Ht 71.0 in | Wt 132.2 lb

## 2021-10-02 DIAGNOSIS — E538 Deficiency of other specified B group vitamins: Secondary | ICD-10-CM | POA: Diagnosis not present

## 2021-10-02 DIAGNOSIS — Z79899 Other long term (current) drug therapy: Secondary | ICD-10-CM | POA: Diagnosis not present

## 2021-10-02 DIAGNOSIS — E559 Vitamin D deficiency, unspecified: Secondary | ICD-10-CM

## 2021-10-02 DIAGNOSIS — M818 Other osteoporosis without current pathological fracture: Secondary | ICD-10-CM | POA: Diagnosis not present

## 2021-10-02 DIAGNOSIS — K50819 Crohn's disease of both small and large intestine with unspecified complications: Secondary | ICD-10-CM | POA: Diagnosis not present

## 2021-10-02 LAB — COMPREHENSIVE METABOLIC PANEL
ALT: 8 U/L (ref 0–53)
AST: 15 U/L (ref 0–37)
Albumin: 3.6 g/dL (ref 3.5–5.2)
Alkaline Phosphatase: 83 U/L (ref 39–117)
BUN: 8 mg/dL (ref 6–23)
CO2: 25 mEq/L (ref 19–32)
Calcium: 8.9 mg/dL (ref 8.4–10.5)
Chloride: 109 mEq/L (ref 96–112)
Creatinine, Ser: 1.35 mg/dL (ref 0.40–1.50)
GFR: 57.09 mL/min — ABNORMAL LOW (ref >60.00)
Glucose, Bld: 101 mg/dL — ABNORMAL HIGH (ref 70–99)
Potassium: 3.9 mEq/L (ref 3.5–5.1)
Sodium: 143 mEq/L (ref 135–145)
Total Bilirubin: 1.6 mg/dL — ABNORMAL HIGH (ref 0.2–1.2)
Total Protein: 6.5 g/dL (ref 6.0–8.3)

## 2021-10-02 LAB — CBC WITH DIFFERENTIAL/PLATELET
Basophils Absolute: 0 10*3/uL (ref 0.0–0.1)
Basophils Relative: 0.6 % (ref 0.0–3.0)
Eosinophils Absolute: 0.3 10*3/uL (ref 0.0–0.7)
Eosinophils Relative: 4 % (ref 0.0–5.0)
HCT: 45.3 % (ref 39.0–52.0)
Hemoglobin: 14.4 g/dL (ref 13.0–17.0)
Lymphocytes Relative: 19.6 % (ref 12.0–46.0)
Lymphs Abs: 1.7 10*3/uL (ref 0.7–4.0)
MCHC: 31.9 g/dL (ref 30.0–36.0)
MCV: 93.3 fl (ref 78.0–100.0)
Monocytes Absolute: 0.8 10*3/uL (ref 0.1–1.0)
Monocytes Relative: 9.1 % (ref 3.0–12.0)
Neutro Abs: 5.7 10*3/uL (ref 1.4–7.7)
Neutrophils Relative %: 66.7 % (ref 43.0–77.0)
Platelets: 249 10*3/uL (ref 150.0–400.0)
RBC: 4.85 Mil/uL (ref 4.22–5.81)
RDW: 15.3 % (ref 11.5–15.5)
WBC: 8.5 10*3/uL (ref 4.0–10.5)

## 2021-10-02 NOTE — Patient Instructions (Addendum)
Please start vitamin D3 5000 units daily.  Try almond milk.  Try to start a multivitamin for men.  Come back in 2 months.  How Can I Prevent Falls? Men and women with osteoporosis need to take care not to fall down. Falls can break bones. Some reasons people fall are: Poor vision  Poor balance  Certain diseases that affect how you walk  Some types of medicine, such as sleeping pills.  Some tips to help prevent falls outdoors are: Use a cane or walker  Wear rubber-soled shoes so you don't slip  Walk on grass when sidewalks are slippery  In winter, put salt or kitty litter on icy sidewalks.  Some ways to help prevent falls indoors are: Keep rooms free of clutter, especially on floors  Use plastic or carpet runners on slippery floors  Wear low-heeled shoes that provide good support  Do not walk in socks, stockings, or slippers  Be sure carpets and area rugs have skid-proof backs or are tacked to the floor  Be sure stairs are well lit and have rails on both sides  Put grab bars on bathroom walls near tub, shower, and toilet  Use a rubber bath mat in the shower or tub  Keep a flashlight next to your bed  Use a sturdy step stool with a handrail and wide steps  Add more lights in rooms (and night lights) Buy a cordless phone to keep with you so that you don't have to rush to the phone  when it rings and so that you can call for help if you fall.   (adapted from http://www.niams.NightlifePreviews.se)   Exercise for Strong Bones (from [place]) There are two types of exercises that are important for building and maintaining bone density:  weight-bearing and muscle-strengthening exercises. Weight-bearing Exercises These exercises include activities that make you move against gravity while staying upright. Weight-bearing exercises can be high-impact or low-impact. High-impact weight-bearing exercises help build bones and keep  them strong. If you have broken a bone due to osteoporosis or are at risk of breaking a bone, you may need to avoid high-impact exercises. If youre not sure, you should check with your healthcare provider. Examples of high-impact weight-bearing exercises are: Dancing Doing high-impact aerobics Hiking Jogging/running Jumping Rope Stair climbing Tennis Low-impact weight-bearing exercises can also help keep bones strong and are a safe alternative if you cannot do high-impact exercises. Examples of low-impact weight-bearing exercises are: Using elliptical training machines Doing low-impact aerobics Using stair-step machines Fast walking on a treadmill or outside Muscle-Strengthening Exercises These exercises include activities where you move your body, a weight or some other resistance against gravity. They are also known as resistance exercises and include: Lifting weights Using elastic exercise bands Using weight machines Lifting your own body weight Functional movements, such as standing and rising up on your toes Yoga and Pilates can also improve strength, balance and flexibility. However, certain positions may not be safe for people with osteoporosis or those at increased risk of broken bones. For example, exercises that have you bend forward may increase the chance of breaking a bone in the spine. A physical therapist should be able to help you learn which exercises are safe and appropriate for you. Non-Impact Exercises Non-impact exercises can help you to improve balance, posture and how well you move in everyday activities. These exercises can also help to increase muscle strength and decrease the risk of falls and broken bones. Some of these exercises include: Balance exercises that  strengthen your legs and test your balance, such as Tai Chi, can decrease your risk of falls. Posture exercises that improve your posture and reduce rounded or sloping shoulders can help you decrease the  chance of breaking a bone, especially in the spine. Functional exercises that improve how well you move can help you with everyday activities and decrease your chance of falling and breaking a bone. For example, if you have trouble getting up from a chair or climbing stairs, you should do these activities as exercises. A physical therapist can teach you balance, posture and functional exercises. Starting a New Exercise Program If you havent exercised regularly for a while, check with your healthcare provider before beginning a new exercise program--particularly if you have health problems such as heart disease, diabetes or high blood pressure. If youre at high risk of breaking a bone, you should work with a physical therapist to develop a safe exercise program. Once you have your healthcare providers approval, start slowly. If youve already broken bones in the spine because of osteoporosis, be very careful to avoid activities that require reaching down, bending forward, rapid twisting motions, heavy lifting and those that increase your chance of a fall. As you get started, your muscles may feel sore for a day or two after you exercise. If soreness lasts longer, you may be working too hard and need to ease up. Exercises should be done in a pain-free range of motion. How Much Exercise Do You Need? Weight-bearing exercises 30 minutes on most days of the week. Do a 30-minutesession or multiple sessions spread out throughout the day. The benefits to your bones are the same.   Muscle-strengthening exercises Two to three days per week. If you dont have much time for strengthening/resistance training, do small amounts at a time. You can do just one body part each day. For example do arms one day, legs the next and trunk the next. You can also spread these exercises out during your normal day.  Balance, posture and functional exercises Every day or as often as needed. You may want to focus on one area more  than the others. If you have fallen or lose your balance, spend time doing balance exercises. If you are getting rounded shoulders, work more on posture exercises. If you have trouble climbing stairs or getting up from the couch, do more functional exercises. You can also perform these exercises at one time or spread them during your day. Work with a phyiscal therapist to learn the right exercises for you.

## 2021-10-02 NOTE — Progress Notes (Signed)
Patient ID: Gregory Hamper., male   DOB: 1961/01/01, 61 y.o.   MRN: [id number]  This visit occurred during the SARS-CoV-2 public health emergency.  Safety protocols were in place, including screening questions prior to the visit, additional usage of staff PPE, and extensive cleaning of exam room while observing appropriate contact time as indicated for disinfecting solutions.   HPI  Gregory [name]. is a 61 y.o.-year-old male, referred by his PCP, Dr. Volanda Napoleon, for management of osteoporosis (OP).  Pt was dx with OP in 2019.  A EXA scan was checked at that time by Dr. Havery Moros, due to history of severe Crohn's disease.  Pt describes that he was diagnosed with Crohn's disease as a teenager, after collapsing at work.  At that time, he was in the ICU, and ended up having had both small and large intestine resection >> has a colostomy bag.   He had repeated Crohn's ds.exacerbations since then. He is treated with Prednisone during exacerbations. He was started on Stelara q mo. He is having trouble getting this approved by his insurance.  I reviewed pt's DXA scan reports: 12/07/2020 L1-L4 T score FN T score  T-score -1.1 (+2.3%) RFN: -3.2 LFN: -2.8 (+1.6%)   03/18/2018 Lumbar spine L1-L4 Femoral neck (FN)  T-score -1.6 RFN: -3.4 LFN: -3.6   No recent falls.  Fractures: - sternum hairline fracture - after fell backwards (05/2017) - reportedly (not seen in his chart)  No dizziness/vertigo/orthostasis/poor vision.  Some instability >> uses a cane most of the time.  No previous OP treatments.  He has a  h/o vitamin D deficiency. Reviewed available vit D levels: Lab Results  Component Value Date   VD25OH 31.42 07/08/2021   VD25OH 20.73 (L) 04/03/2021   VD25OH 27.28 (L) 05/28/2020   VD25OH 19.98 (L) 05/11/2019   VD25OH 23.90 (L) 12/12/2018   VD25OH 15.01 (L) 02/23/2017   Pt is on: - vitamin D -  4000 units - ran out 1 mo ago and cannot afford it  He is not exercising.  He feels  tired even with minimal activity.  He does not take high vitamin A doses.  He tried to take a multivitamin before but could not tolerate it due to stomach irritation.  Upon questioning, this likely contained iron.  No FH of osteoporosis.  He has a h/o hypocalcemia.  Lab Results  Component Value Date   CALCIUM 8.1 (L) 05/25/2021   CALCIUM 8.1 (L) 05/24/2021   CALCIUM 7.8 (L) 05/23/2021   CALCIUM 8.1 (L) 05/23/2021   CALCIUM 8.9 05/22/2021   CALCIUM 8.2 (L) 04/03/2021   CALCIUM 8.5 10/14/2020   CALCIUM 8.9 05/28/2020   CALCIUM 6.8 (L) 12/27/2019   CALCIUM 7.6 (L) 12/22/2019   He has a history of kidney stones. He had ESWL.  No h/o thyrotoxicosis. Reviewed TSH recent levels:  Lab Results  Component Value Date   TSH 0.611 12/20/2019   Last BUN/Cr: Lab Results  Component Value Date   BUN 15 05/25/2021   CREATININE 1.37 (H) 05/25/2021   Also, GERD, ED, history of acute endocarditis, B12 deficiency (on IM injections), depression.  ROS: Constitutional: + Recent 3 pound weight gain, + fatigue, no subjective hyperthermia, no subjective hypothermia, + nocturia, + poor sleep Eyes: no blurry vision, no xerophthalmia ENT: no sore throat, no nodules palpated in neck, no dysphagia, no odynophagia, no hoarseness, no tinnitus, no hypoacusis Cardiovascular: no CP, no SOB, no palpitations, no leg swelling Respiratory: no cough, no SOB, no wheezing  Gastrointestinal: + N, no V, + D, no C, no acid reflux Musculoskeletal: no muscle,+ joint aches Skin: no rash, no hair loss Neurological: no tremors, no numbness or tingling/no dizziness/no HAs Psychiatric: no depression, no anxiety  I reviewed pt's medications, allergies, PMH, social hx, family hx, and changes were documented in the history of present illness. Otherwise, unchanged from my initial visit note.  Past Medical History:  Diagnosis Date   Acute endocarditis    COVID 05/2021   Crohn's disease ([place])    Depression    Eating  disorder    GERD (gastroesophageal reflux disease)    Kidney stones    Short gut syndrome    Past Surgical History:  Procedure Laterality Date   COLONOSCOPY  06/11/2020   COLONOSCOPY  07/05/2018   COLONOSCOPY WITH PROPOFOL N/A 09/17/2017   Procedure: COLONOSCOPY WITH PROPOFOL;  Surgeon: [name], MD;  Location: [facility];  Service: Endoscopy;  Laterality: N/A;  Through ileostomy.   CYSTOSCOPY WITH STENT PLACEMENT Left 05/20/2017   Procedure: CYSTOSCOPY WITH STENT PLACEMENT;  Surgeon: [name], MD;  Location: [facility];  Service: Urology;  Laterality: Left;   ILEOSTOMY     IR NEPHROSTOMY EXCHANGE LEFT  04/12/2017   IR NEPHROSTOMY PLACEMENT LEFT  04/12/2017   IR NEPHROSTOMY PLACEMENT LEFT  04/29/2017   NEPHROLITHOTOMY Left 05/14/2017   Procedure: LEFT NEPHROLITHOTOMY PERCUTANEOUS WITH SURGEON ACCESS;  Surgeon: [name], MD;  Location: [facility];  Service: Urology;  Laterality: Left;   SMALL INTESTINE SURGERY     SUBTOTAL COLECTOMY     Social History   Socioeconomic History   Marital status: Divorced    Spouse name: Not on file   Number of children: 2   Years of education: Not on file   Highest education level: Not on file  Occupational History   Occupation: Retired   Occupation: On disability  Tobacco Use   Smoking status: Former    Packs/day: 1.00    Years: 20.00    Pack years: 20.00    Types: Cigarettes    Quit date: 08/31/2006    Years since quitting: 15.0   Smokeless tobacco: Never  Vaping Use   Vaping Use: Never used  Substance and Sexual Activity   Alcohol use: No   Drug use: No   Sexual activity: Not Currently  Other Topics Concern   Not on file  Social History Narrative   Not on file   Social Determinants of Health   Financial Resource Strain: Low Risk    Difficulty of Paying Living Expenses: Not hard at all  Food Insecurity: No Food Insecurity   Worried About Charity fundraiser in the Last Year: Never true   [place] in  the Last Year: Never true  Transportation Needs: No Transportation Needs   Lack of Transportation (Medical): No   Lack of Transportation (Non-Medical): No  Physical Activity: Insufficiently Active   Days of Exercise per Week: 3 days   Minutes of Exercise per Session: 10 min  Stress: No Stress Concern Present   Feeling of Stress : Not at all  Social Connections: Not on file  Intimate Partner Violence: Not on file   Current Outpatient Medications on File Prior to Visit  Medication Sig Dispense Refill   acetaminophen (TYLENOL) 500 MG tablet Take 500 mg by mouth every 6 (six) hours as needed for moderate pain.     cyanocobalamin (,VITAMIN B-12,) 1000 MCG/ML injection Inject 1 mL (1,000 mcg total)  into the muscle every 30 (thirty) days. 3 mL 1   LOMOTIL 2.5-0.025 MG tablet Take 1 tablet by mouth 4 (four) times daily as needed for diarrhea or loose stools. 120 tablet 2   Multiple Vitamin (MULTIVITAMIN WITH MINERALS) TABS tablet Take 1 tablet by mouth daily. 30 tablet 3   omeprazole (PRILOSEC) 20 MG capsule Take 1 capsule (20 mg total) by mouth daily. 90 capsule 1   ondansetron (ZOFRAN) 4 MG tablet TAKE 1 TABLET EVERY 8 HOURS AS NEEDED FOR [place] OR VOMITING 30 tablet 1   potassium chloride (KLOR-CON) 20 MEQ packet Take 20 mEq by mouth daily. 90 each 1   sertraline (ZOLOFT) 50 MG tablet Take 1 tablet (50 mg total) by mouth daily. 90 tablet 1   sildenafil (VIAGRA) 25 MG tablet Take 0.5 tablets (12.5 mg total) by mouth daily as needed for erectile dysfunction. 10 tablet 4   traMADol (ULTRAM) 50 MG tablet TAKE ONE TABLET EVERY 8 HOURS AS NEEDED FOR PAIN (Patient taking differently: Take 50 mg by mouth daily.) 90 tablet 0   ustekinumab (STELARA) 90 MG/ML SOSY injection Inject 1 mL (90 mg total) into the skin every 28 (twenty-eight) days. 1 mL 6   Vitamin D, Ergocalciferol, 2000 units CAPS Take 4,000 Units by mouth daily.     No current facility-administered medications on file prior to visit.    Allergies  Allergen Reactions   Methotrexate Derivatives Other (See Comments)    Anemia, low WBC, severe GI symptoms,    Phenergan [Promethazine Hcl] Other (See Comments)    twitching   Humira [Adalimumab] Other (See Comments)    Intolerance   Penicillins Other (See Comments)    REACTION DURING SURGERY Has patient had a PCN reaction causing immediate rash, facial/tongue/throat swelling, SOB or lightheadedness with hypotension: Unknown Has patient had a PCN reaction causing severe rash involving mucus membranes or skin necrosis: Unknown Has patient had a PCN reaction that required hospitalization: Unknown Has patient had a PCN reaction occurring within the last 10 years: No If all of the above answers are "NO", then may proceed with Cephalosporin use.    Remicade [Infliximab] Other (See Comments)    Intolerance   Family History  Problem Relation Age of Onset   Heart disease Mother    Diabetes Father    Heart disease Father    Heart attack Father    Cancer Sister    Heart disease Sister    Melanoma Sister    Crohn's disease Brother    Inflammatory bowel disease Brother    Diabetes Paternal Grandmother    Colon cancer Neg Hx    Rectal cancer Neg Hx    Stomach cancer Neg Hx    Esophageal cancer Neg Hx    PE: BP 100/70 (BP Location: Right Arm, Patient Position: Sitting, Cuff Size: Normal)    Pulse 79    Ht 5' 11"  (1.803 m)    Wt 132 lb 3.2 oz (60 kg)    SpO2 96%    BMI 18.44 kg/m  Wt Readings from Last 3 Encounters:  10/02/21 132 lb 3.2 oz (60 kg)  08/19/21 132 lb (59.9 kg)  08/11/21 129 lb (58.5 kg)   Constitutional: Underweight, in NAD. No kyphosis, but stooped posture Eyes: PERRLA, EOMI, no exophthalmos ENT: moist mucous membranes, no thyromegaly, no cervical lymphadenopathy Cardiovascular: RRR, No MRG Respiratory: CTA B Gastrointestinal: abdomen soft, NT, ND, BS+ Musculoskeletal: no deformities, strength intact in all 4 Skin: moist, warm, no rashes Neurological:  no tremor with outstretched hands, DTR normal in all 4  Assessment: 1. Osteoporosis  Plan: 1. Osteoporosis - likely in the setting of malnutrition due to Crohn's disease and also possibly contributed to by his many steroid courses - Discussed about increased risk of fracture, depending on the T score, greatly increased when the T score is lower than -2.5, but it is actually a continuum and -2.5 should not be regarded as an absolute threshold. We reviewed his DXA scan report together, and I explained that based on the T scores, he has an increased risk for fractures.  - we reviewed her dietary and supplemental calcium and vitamin D intake. I recommended to make sure he gets 1000-1200 mg of calcium daily preferentially from diet but unfortunately, due to his short bowel/gross disease, this may not be enough.  He mentions that in the last year he changed his diet to include more rice and green leafy vegetables and he feels much better afterwards and started to gain some weight.  I also advised him to try to add some almond milk.  I suggested to add a multivitamin without iron.  If he cannot tolerate this, we may need to start calcium citrate, which is better tolerated than calcium carbonate.  However, he is on a very limited income and I am not sure if he could afford this formulation. - discussed fall precautions   -he has rugs in the house and I advised him to eliminate them; he also has a small dog and I advised him to be very careful not to trip on him - given handout from [facility]: weight bearing exercises - advised to do this every day or at least 5/7 days.  Demonstrated different exercises that he can do at home by himself - we discussed about maintaining a good amount of protein in his diet. The recommended daily protein intake is ~0.8 g per kilogram per day, however, for him, he most likely needs a higher amount, approximately 1 g/kg/day, so probably approximately 60  g/day. I advised him to try to aim for this amount, since a diet low in proteins can exacerbate osteoporosis. Also, avoid smoking or >2 drinks of alcohol a day. - We discussed about the different medication classes and their benefits. He is not a good candidate for p.o. antiresorptive's (bisphosphonates) and also due to his history of hypocalcemia, IV bisphosphonates and Prolia would not be first option. My recommendation would be for Teriparatide/Abaloparatide (which are daily subcu medication) or Romosozumab (monthly) as a second line for him. -However, we decided to improve his vitamin D and calcium level first and then proceed with bone anabolic therapy. -He has a history of vitamin D deficiency but his last vitamin D level from 07/2021 was finally normal on 4000 units vitamin D daily.  At this visit, he tells me that he could not afford to refill it and he has been off the vitamin D for 1 month.  I advised him to restart 5000 units vitamin D daily and we will recheck the level in 2 months. -I will have him come to the clinic then and we will recheck his vitamin D, calcium, uric acid, and a 24-hour urine calcium, before trying to start teriparatide/abaloparatide - will check a new DXA scan in 2 years after the previous-  I explained that the first indication that the treatment is working is him not having anymore fractures. DXA scan changes are secondary: unchanged or slightly higher T-scores are desirable -  RTC in 2 mo  Philemon Kingdom, MD PhD [facility] Endocrinology

## 2021-11-25 ENCOUNTER — Telehealth: Payer: Self-pay

## 2021-11-25 NOTE — Telephone Encounter (Signed)
I was not seeing him for knee pain, but for osteoporosis.  I am glad he feels better on vitamin D from his knee pain point of view but osteoporosis is another entity and we planned to have another appointment in which we could check labs and discussed about starting a medication for strengthening his bones.  My opinion would be to keep the appointment. ?

## 2021-11-25 NOTE — Telephone Encounter (Signed)
Pt called to see if upcoming appt is still needed. Pt advised he was seeing you for his knee pain but the supplements (vitamin D 50,000 u) he has been on has been helping and he does not think he needs the appointment.  ?

## 2021-11-26 NOTE — Telephone Encounter (Signed)
Pt advised of Dr recommendation and confirmed appt ?

## 2021-12-01 ENCOUNTER — Encounter: Payer: Self-pay | Admitting: Internal Medicine

## 2021-12-01 ENCOUNTER — Ambulatory Visit (INDEPENDENT_AMBULATORY_CARE_PROVIDER_SITE_OTHER): Payer: Medicare Other | Admitting: Internal Medicine

## 2021-12-01 VITALS — BP 148/92 | HR 76 | Ht 71.0 in | Wt 130.0 lb

## 2021-12-01 DIAGNOSIS — E559 Vitamin D deficiency, unspecified: Secondary | ICD-10-CM

## 2021-12-01 DIAGNOSIS — M818 Other osteoporosis without current pathological fracture: Secondary | ICD-10-CM | POA: Diagnosis not present

## 2021-12-01 LAB — VITAMIN D 25 HYDROXY (VIT D DEFICIENCY, FRACTURES): VITD: 28.75 ng/mL — ABNORMAL LOW (ref 30.00–100.00)

## 2021-12-01 LAB — URIC ACID: Uric Acid, Serum: 5.6 mg/dL (ref 4.0–7.8)

## 2021-12-01 NOTE — Progress Notes (Addendum)
Patient ID: Gregory [name]., male   DOB: Nov 22, 1960, 61 y.o.   MRN: [id number] ? ?This visit occurred during the SARS-CoV-2 public health emergency.  Safety protocols were in place, including screening questions prior to the visit, additional usage of staff PPE, and extensive cleaning of exam room while observing appropriate contact time as indicated for disinfecting solutions.  ? ?HPI  ?Gregory [name]. is a 61 y.o.-year-old male, initially referred by his PCP, Dr. Volanda Napoleon, returning for follow-up for osteoporosis (OP).  Last visit 2 months ago. ? ?Interim history: ?No falls or fractures since last visit.  No dizziness, orthostasis, vertigo. ?We started vitamin D at last visit and also started drinking almond milk >> much less joint pains since.  In fact, he feels so much better that he is now able to walk without a cane. ?Also started resistance exercise with elastic bands. ? ?Reviewed history: ?Pt was dx with OP in 2019.  A DXA scan was checked at that time by Dr. Havery Moros, due to history of severe Crohn's disease. ? ?Pt describes that he was diagnosed with Crohn's disease as a teenager, after collapsing at work.  At that time, he was in the ICU, and ended up having had both small and large intestine resection >> has a colostomy bag.  ? ?He had repeated Crohn's ds.exacerbations since then. He is treated with Prednisone during exacerbations. He was started on Stelara q mo. He is having trouble getting this approved by his insurance. ? ?I reviewed pt's DXA scan reports: ?12/07/2020 L1-L4 T score FN T score  ?T-score -1.1 (+2.3%) RFN: -3.2 ?LFN: -2.8 ?(+1.6%)  ? ?03/18/2018 Lumbar spine L1-L4 Femoral neck (FN)  ?T-score -1.6 RFN: -3.4 ?LFN: -3.6  ? ?Fractures: ?- sternum hairline fracture - after fell backwards (05/2017) - reportedly (not seen in his chart) ? ?No previous OP treatments. ? ?He has a  h/o vitamin D deficiency. Reviewed available vit D levels: ?Lab Results  ?Component Value Date  ? VD25OH 31.42  07/08/2021  ? VD25OH 20.73 (L) 04/03/2021  ? VD25OH 27.28 (L) 05/28/2020  ? VD25OH 19.98 (L) 05/11/2019  ? VD25OH 23.90 (L) 12/12/2018  ? VD25OH 15.01 (L) 02/23/2017  ? ?Pt is on: ?- vitamin D -  4000 units - ran out 1 mo before our last appointment, but he was able to restart 5000 units since then ? ?He was not exercising at last visit, as he felt tired even with minimal activity.  Since last visit, he started to work with elastic bands and he does this approximately every other day. ? ?He does not take high vitamin A doses. ? ?He tried to take a multivitamin before but could not tolerate it due to stomach irritation.  Upon questioning, this likely contained iron. ? ?No FH of osteoporosis. ? ?He has a h/o hypocalcemia.  ?Lab Results  ?Component Value Date  ? CALCIUM 8.9 10/02/2021  ? CALCIUM 8.1 (L) 05/25/2021  ? CALCIUM 8.1 (L) 05/24/2021  ? CALCIUM 7.8 (L) 05/23/2021  ? CALCIUM 8.1 (L) 05/23/2021  ? CALCIUM 8.9 05/22/2021  ? CALCIUM 8.2 (L) 04/03/2021  ? CALCIUM 8.5 10/14/2020  ? CALCIUM 8.9 05/28/2020  ? CALCIUM 6.8 (L) 12/27/2019  ? ?He has a history of kidney stones. He had ESWL. ? ?No h/o thyrotoxicosis. Reviewed TSH recent levels:  ?Lab Results  ?Component Value Date  ? TSH 0.611 12/20/2019  ? ?Last BUN/Cr: ?Lab Results  ?Component Value Date  ? BUN 8 10/02/2021  ? CREATININE 1.35 10/02/2021  ? ?  Also, GERD, ED, history of acute endocarditis, B12 deficiency (on IM injections), depression. ? ?ROS: ?+ Fatigue, nocturia, nausea, diarrhea ? ?I reviewed pt's medications, allergies, PMH, social hx, family hx, and changes were documented in the history of present illness. Otherwise, unchanged from my initial visit note. ? ?Past Medical History:  ?Diagnosis Date  ? Acute endocarditis   ? COVID 05/2021  ? Crohn's disease ([place])   ? Depression   ? Eating disorder   ? GERD (gastroesophageal reflux disease)   ? Kidney stones   ? Short gut syndrome   ? ?Past Surgical History:  ?Procedure Laterality Date  ? COLONOSCOPY   06/11/2020  ? COLONOSCOPY  07/05/2018  ? COLONOSCOPY WITH PROPOFOL N/A 09/17/2017  ? Procedure: COLONOSCOPY WITH PROPOFOL;  Surgeon: [name], MD;  Location: [facility];  Service: Endoscopy;  Laterality: N/A;  Through ileostomy.  ? CYSTOSCOPY WITH STENT PLACEMENT Left 05/20/2017  ? Procedure: CYSTOSCOPY WITH STENT PLACEMENT;  Surgeon: [name], MD;  Location: [facility];  Service: Urology;  Laterality: Left;  ? ILEOSTOMY    ? IR NEPHROSTOMY EXCHANGE LEFT  04/12/2017  ? IR NEPHROSTOMY PLACEMENT LEFT  04/12/2017  ? IR NEPHROSTOMY PLACEMENT LEFT  04/29/2017  ? NEPHROLITHOTOMY Left 05/14/2017  ? Procedure: LEFT NEPHROLITHOTOMY PERCUTANEOUS WITH SURGEON ACCESS;  Surgeon: [name], MD;  Location: [facility];  Service: Urology;  Laterality: Left;  ? SMALL INTESTINE SURGERY    ? SUBTOTAL COLECTOMY    ? ?Social History  ? ?Socioeconomic History  ? Marital status: Divorced  ?  Spouse name: Not on file  ? Number of children: 2  ? Years of education: Not on file  ? Highest education level: Not on file  ?Occupational History  ? Occupation: Retired  ? Occupation: On disability  ?Tobacco Use  ? Smoking status: Former  ?  Packs/day: 1.00  ?  Years: 20.00  ?  Pack years: 20.00  ?  Types: Cigarettes  ?  Quit date: 08/31/2006  ?  Years since quitting: 15.2  ? Smokeless tobacco: Never  ?Vaping Use  ? Vaping Use: Never used  ?Substance and Sexual Activity  ? Alcohol use: No  ? Drug use: No  ? Sexual activity: Not Currently  ?Other Topics Concern  ? Not on file  ?Social History Narrative  ? Not on file  ? ?Social Determinants of Health  ? ?Financial Resource Strain: Low Risk   ? Difficulty of Paying Living Expenses: Not hard at all  ?Food Insecurity: No Food Insecurity  ? Worried About Charity fundraiser in the Last Year: Never true  ? Ran Out of Food in the Last Year: Never true  ?Transportation Needs: No Transportation Needs  ? Lack of Transportation (Medical): No  ? Lack of Transportation (Non-Medical): No   ?Physical Activity: Insufficiently Active  ? Days of Exercise per Week: 3 days  ? Minutes of Exercise per Session: 10 min  ?Stress: No Stress Concern Present  ? Feeling of Stress : Not at all  ?Social Connections: Not on file  ?Intimate Partner Violence: Not on file  ? ?Current Outpatient Medications on File Prior to Visit  ?Medication Sig Dispense Refill  ? acetaminophen (TYLENOL) 500 MG tablet Take 500 mg by mouth every 6 (six) hours as needed for moderate pain.    ? cyanocobalamin (,VITAMIN B-12,) 1000 MCG/ML injection Inject 1 mL (1,000 mcg total) into the muscle every 30 (thirty) days. 3 mL 1  ? LOMOTIL 2.5-0.025 MG tablet Take  1 tablet by mouth 4 (four) times daily as needed for diarrhea or loose stools. 120 tablet 2  ? Multiple Vitamin (MULTIVITAMIN WITH MINERALS) TABS tablet Take 1 tablet by mouth daily. 30 tablet 3  ? omeprazole (PRILOSEC) 20 MG capsule Take 1 capsule (20 mg total) by mouth daily. 90 capsule 1  ? ondansetron (ZOFRAN) 4 MG tablet TAKE 1 TABLET EVERY 8 HOURS AS NEEDED FOR NASUEA OR VOMITING 30 tablet 1  ? potassium chloride (KLOR-CON) 20 MEQ packet Take 20 mEq by mouth daily. 90 each 1  ? sertraline (ZOLOFT) 50 MG tablet Take 1 tablet (50 mg total) by mouth daily. 90 tablet 1  ? sildenafil (VIAGRA) 25 MG tablet Take 0.5 tablets (12.5 mg total) by mouth daily as needed for erectile dysfunction. 10 tablet 4  ? traMADol (ULTRAM) 50 MG tablet TAKE ONE TABLET EVERY 8 HOURS AS NEEDED FOR PAIN (Patient taking differently: Take 50 mg by mouth daily.) 90 tablet 0  ? ustekinumab (STELARA) 90 MG/ML SOSY injection Inject 1 mL (90 mg total) into the skin every 28 (twenty-eight) days. 1 mL 6  ? Vitamin D, Ergocalciferol, 2000 units CAPS Take 4,000 Units by mouth daily.    ? ?No current facility-administered medications on file prior to visit.  ? ?Allergies  ?Allergen Reactions  ? Methotrexate Derivatives Other (See Comments)  ?  Anemia, low WBC, severe GI symptoms,   ? Phenergan [Promethazine Hcl] Other  (See Comments)  ?  twitching  ? Humira [Adalimumab] Other (See Comments)  ?  Intolerance  ? Penicillins Other (See Comments)  ?  REACTION DURING SURGERY ?Has patient had a PCN reaction causing immediate rash, facial/t

## 2021-12-01 NOTE — Patient Instructions (Signed)
Please stop at the lab. ? ?Afterwards, start a 24h urine collection. ?Patient information (Up-to-Date): Collection of a 24-hour urine specimen  ?- You should collect every drop of urine during each 24-hour period. It does not matter how much or little urine is passed each time, as long as every drop is collected. ?- Begin the urine collection in the morning after you wake up, after you have emptied your bladder for the first time. ?- Urinate (empty the bladder) for the first time and flush it down the toilet. Note the exact time (eg, 6:15 AM). You will begin the urine collection at this time. ?- Collect every drop of urine during the day and night in an empty collection bottle. Store the bottle at room temperature or in the refrigerator. ?- If you need to have a bowel movement, any urine passed with the bowel movement should be collected. Try not to include feces with the urine collection. If feces does get mixed in, do not try to remove the feces from the urine collection bottle. ?- Finish by collecting the first urine passed the next morning, adding it to the collection bottle. This should be within ten minutes before or after the time of the first morning void on the first day (which was flushed). In this example, you would try to void between 6:05 and 6:25 on the second day. ?- If you need to urinate one hour before the final collection time, drink a full glass of water so that you can void again at the appropriate time. If you have to urinate 20 minutes before, try to hold the urine until the proper time. ?- Please note the exact time of the final collection, even if it is not the same time as when collection began on day 1. ?- The bottle(s) may be kept at room temperature for a day or two, but should be kept cool or refrigerated for longer periods of time. ? ?Please come back for a follow-up appointment in 4 months. ? ?

## 2021-12-02 ENCOUNTER — Encounter: Payer: Self-pay | Admitting: Internal Medicine

## 2021-12-02 LAB — BASIC METABOLIC PANEL WITH GFR
BUN/Creatinine Ratio: 9 (calc) (ref 6–22)
BUN: 12 mg/dL (ref 7–25)
CO2: 21 mmol/L (ref 20–32)
Calcium: 8.8 mg/dL (ref 8.6–10.3)
Chloride: 110 mmol/L (ref 98–110)
Creat: 1.41 mg/dL — ABNORMAL HIGH (ref 0.70–1.35)
Glucose, Bld: 98 mg/dL (ref 65–99)
Potassium: 3.8 mmol/L (ref 3.5–5.3)
Sodium: 140 mmol/L (ref 135–146)
eGFR: 57 mL/min/{1.73_m2} — ABNORMAL LOW (ref >=60)

## 2021-12-02 LAB — SPECIMEN STATUS REPORT

## 2021-12-04 LAB — SPECIMEN STATUS REPORT

## 2021-12-04 LAB — PARATHYROID HORMONE, INTACT (NO CA): PTH: 38 pg/mL (ref 15–65)

## 2021-12-09 ENCOUNTER — Other Ambulatory Visit: Payer: Medicare Other

## 2021-12-09 DIAGNOSIS — M818 Other osteoporosis without current pathological fracture: Secondary | ICD-10-CM | POA: Diagnosis not present

## 2021-12-09 NOTE — Progress Notes (Unsigned)
Total volume 550.  12-05-2021 at 12:15 pm, ended 12-06-2021 12:15 pm.   ?

## 2021-12-10 LAB — CALCIUM, URINE, 24 HOUR: Calcium, 24H Urine: 7 mg/24 h — ABNORMAL LOW

## 2021-12-10 LAB — CREATININE, URINE, 24 HOUR: Creatinine, 24H Ur: 1.4 g/(24.h) (ref 0.50–2.15)

## 2021-12-11 MED ORDER — TYMLOS 3120 MCG/1.56ML ~~LOC~~ SOPN: PEN_INJECTOR | SUBCUTANEOUS | 11 refills | Status: DC

## 2021-12-11 NOTE — Addendum Note (Signed)
Addended by: Philemon Kingdom on: 12/11/2021 01:04 PM ? ? Modules accepted: Orders ? ?

## 2021-12-12 ENCOUNTER — Telehealth: Payer: Self-pay

## 2021-12-12 ENCOUNTER — Other Ambulatory Visit (HOSPITAL_COMMUNITY): Payer: Self-pay

## 2021-12-12 NOTE — Telephone Encounter (Signed)
Patient Advocate Encounter ? ?Prior Authorization for Tymlos 1.3m injection has been approved.   ? ?PA# [id number]? ?Effective dates: 11/12/21 through 12/12/23 ? ?Patient has to fill with [facility]- [phone]? ?Patient Advocate ?Fax: [phone] ?

## 2022-01-19 ENCOUNTER — Telehealth: Payer: Self-pay | Admitting: Gastroenterology

## 2022-01-19 NOTE — Telephone Encounter (Signed)
Inbound call from pharmacy stating patient is needing a refill for Stelara. Please advise.

## 2022-01-20 NOTE — Telephone Encounter (Signed)
Called and spoke with Gregory [name] at [facility]. The last prescription they have on file for pt is from 05/2021. I informed them that pt has a new insurance so his prescription was sent to [facility] this year. Gregory [name] notated this in patient's account.   Pt is overdue for a routine follow up appt. Pt has been scheduled for a f/u appt with Dr. Havery [name] on Tuesday, 02/24/22 at 2:10 pm. MyChart message sent to patient with appt information.

## 2022-01-30 ENCOUNTER — Other Ambulatory Visit: Payer: Self-pay | Admitting: Gastroenterology

## 2022-02-24 ENCOUNTER — Encounter: Payer: Self-pay | Admitting: Gastroenterology

## 2022-02-24 ENCOUNTER — Ambulatory Visit (INDEPENDENT_AMBULATORY_CARE_PROVIDER_SITE_OTHER): Payer: Medicare Other | Admitting: Gastroenterology

## 2022-02-24 VITALS — BP 90/60 | HR 69 | Ht 71.0 in | Wt 126.0 lb

## 2022-02-24 DIAGNOSIS — Z79899 Other long term (current) drug therapy: Secondary | ICD-10-CM

## 2022-02-24 DIAGNOSIS — Z933 Colostomy status: Secondary | ICD-10-CM

## 2022-02-24 DIAGNOSIS — M818 Other osteoporosis without current pathological fracture: Secondary | ICD-10-CM | POA: Diagnosis not present

## 2022-02-24 DIAGNOSIS — K50819 Crohn's disease of both small and large intestine with unspecified complications: Secondary | ICD-10-CM

## 2022-02-24 MED ORDER — SUTAB 1479-225-188 MG PO TABS: 24.0000 | ORAL_TABLET | ORAL | 0 refills | Status: DC

## 2022-02-24 MED ORDER — CYANOCOBALAMIN 1000 MCG/ML IJ SOLN: 1000.0000 ug | INTRAMUSCULAR | 1 refills | Status: DC

## 2022-03-19 DIAGNOSIS — Z933 Colostomy status: Secondary | ICD-10-CM | POA: Diagnosis not present

## 2022-03-23 ENCOUNTER — Encounter: Payer: Self-pay | Admitting: Gastroenterology

## 2022-03-24 ENCOUNTER — Telehealth: Payer: Self-pay

## 2022-03-24 DIAGNOSIS — K50819 Crohn's disease of both small and large intestine with unspecified complications: Secondary | ICD-10-CM

## 2022-03-24 DIAGNOSIS — Z79899 Other long term (current) drug therapy: Secondary | ICD-10-CM

## 2022-03-24 DIAGNOSIS — D649 Anemia, unspecified: Secondary | ICD-10-CM

## 2022-03-24 DIAGNOSIS — E559 Vitamin D deficiency, unspecified: Secondary | ICD-10-CM

## 2022-03-24 DIAGNOSIS — E538 Deficiency of other specified B group vitamins: Secondary | ICD-10-CM

## 2022-03-24 MED ORDER — STELARA 90 MG/ML ~~LOC~~ SOSY: 90.0000 mg | PREFILLED_SYRINGE | SUBCUTANEOUS | 6 refills | Status: DC

## 2022-03-24 NOTE — Telephone Encounter (Signed)
-----   Message from Roetta Sessions, [place] sent at 10/03/2021  8:37 AM EST ----- Regarding: due for cbc and cmet Patient will be due for cbc and cmet around 8-2

## 2022-03-24 NOTE — Telephone Encounter (Signed)
Received a fax from [facility] stating that a prescription for Stelara 130 mg was sent in for patient. I called [facility] and spoke with Reniel, physician services representative. I informed Reniel that 130 mg is the incorrect dose and patient is taking Stelara 90 mg every 4 weeks. Reniel asked that I send over new prescription since patient is out of refills. He also need patient's updated insurance information. I provided them with patient's Medicare insurance information on file. Reniel had no other concerns.

## 2022-03-24 NOTE — Telephone Encounter (Signed)
Called and spoke to patient.  He is having his colonoscopy on 8-4 and will come a little early to have labs done that morning

## 2022-04-03 ENCOUNTER — Ambulatory Visit (AMBULATORY_SURGERY_CENTER): Payer: Medicare Other | Admitting: Gastroenterology

## 2022-04-03 ENCOUNTER — Encounter: Payer: Self-pay | Admitting: Gastroenterology

## 2022-04-03 VITALS — BP 96/68 | HR 72 | Temp 98.0°F | Resp 14 | Ht 71.0 in | Wt 126.0 lb

## 2022-04-03 DIAGNOSIS — K5000 Crohn's disease of small intestine without complications: Secondary | ICD-10-CM | POA: Diagnosis not present

## 2022-04-03 DIAGNOSIS — K50018 Crohn's disease of small intestine with other complication: Secondary | ICD-10-CM

## 2022-04-03 DIAGNOSIS — K5730 Diverticulosis of large intestine without perforation or abscess without bleeding: Secondary | ICD-10-CM | POA: Diagnosis not present

## 2022-04-03 DIAGNOSIS — Z980 Intestinal bypass and anastomosis status: Secondary | ICD-10-CM | POA: Diagnosis not present

## 2022-04-03 MED ORDER — SODIUM CHLORIDE 0.9 % IV SOLN: 500.0000 mL | Freq: Once | INTRAVENOUS | Status: DC

## 2022-04-03 NOTE — Op Note (Signed)
[place] Patient Name: Gregory [name] Procedure Date: 04/03/2022 11:47 AM MRN: [id number] Endoscopist: Remo Lipps P. Havery Moros , MD Age: 61 Referring MD:  Date of Birth: 1960-09-28 Gender: Male Account #: [id number] Procedure:                Colonoscopy Indications:              Disease activity assessment of Crohn's disease of                            the small bowel - severe Crohn's disease - right                            colon anastomosis with sigmoid colostomy. Currently                            on high dose Stelara (every 4 weeks) - this regimen                            has certainly helped him but not in clinical                            remission Medicines:                Monitored Anesthesia Care Procedure:                Pre-Anesthesia Assessment:                           - Prior to the procedure, a History and Physical                            was performed, and patient medications and                            allergies were reviewed. The patient's tolerance of                            previous anesthesia was also reviewed. The risks                            and benefits of the procedure and the sedation                            options and risks were discussed with the patient.                            All questions were answered, and informed consent                            was obtained. Prior Anticoagulants: The patient has                            taken no previous anticoagulant or antiplatelet  agents. ASA Grade Assessment: III - A patient with                            severe systemic disease. After reviewing the risks                            and benefits, the patient was deemed in                            satisfactory condition to undergo the procedure.                           After obtaining informed consent, the colonoscope                            was passed under direct vision. Throughout the                             procedure, the patient's blood pressure, pulse, and                            oxygen saturations were monitored continuously. The                            Olympus PCF-H190DL (#[id number]) Colonoscope was                            introduced through the sigmoid colostomy and                            advanced to the the terminal ileum. The colonoscopy                            was performed without difficulty. The patient                            tolerated the procedure well. The quality of the                            bowel preparation was adequate. The terminal ileum                            was photographed. Scope In: 12:03:17 PM Scope Out: 12:10:14 PM Scope Withdrawal Time: 0 hours 4 minutes 21 seconds  Total Procedure Duration: 0 hours 6 minutes 57 seconds  Findings:                 Diffuse inflammation, graded as Rutgeerts Score i3                            (diffuse aphthous ileitis) was found in the                            terminal ileum and at the surgical anastomosis.  There was evidence of a prior end-to-end                            ileo-colonic anastomosis in the ascending colon.                            This was patent and was characterized by                            inflammation. Inflammation slightly worse than the                            last exam.                           A few small-mouthed diverticula were found in the                            entire colon.                           The exam was otherwise without abnormality. The                            ostomy was normal. Prep adequate but some residual                            seeds / nuts which made it difficult to view a                            small area of the colon in the hepatic flexure Complications:            No immediate complications. Estimated blood loss:                            Minimal. Estimated Blood Loss:      Estimated blood loss was minimal. Impression:               - Crohn's disease with active ileitis.                           - Patent end-to-end ileo-colonic anastomosis,                            characterized by inflammation.                           - Diverticulosis in the entire examined colon.                           - The examination was otherwise normal.                           - No polyps.  Unfortunately active ileitis despite high dose                            Stelara. Patient has failed numerous regimen in the                            past. Given active disease, worse over time,                            recommend transition to either Rinvoq or Skyrizi at                            this point. Will discuss options with him. Recommendation:           - Patient has a contact number available for                            emergencies. The signs and symptoms of potential                            delayed complications were discussed with the                            patient. Return to normal activities tomorrow.                            Written discharge instructions were provided to the                            patient.                           - Resume previous diet.                           - Continue present medications.                           - Anticipate change to Rinvoq or Skyrizi in near                            future for management of Crohn's, as above Remo Lipps P. Darshan Solanki, MD 04/03/2022 12:17:58 PM This report has been signed electronically.

## 2022-04-03 NOTE — Progress Notes (Signed)
[facility] History and Physical   Primary Care Physician:  Billie Ruddy, MD   Reason for Procedure:   Crohn's disease  Plan:    colonoscopy     HPI: Gregory [name]. is a 61 y.o. male  here for colonoscopy surveillance - history of small bowel Crohn's. On high dose Stelara, here for surveillance exam. Otherwise feels well without any cardiopulmonary symptoms.   I have discussed risks / benefits of anesthesia and endoscopic procedure with Gregory [name]. and they wish to proceed with the exams as outlined today.    Past Medical History:  Diagnosis Date   Acute endocarditis    Blood transfusion without reported diagnosis    COVID 05/2021   Crohn's disease ([place])    Depression    Eating disorder    GERD (gastroesophageal reflux disease)    Kidney stones    Osteoporosis    Short gut syndrome     Past Surgical History:  Procedure Laterality Date   COLONOSCOPY  06/11/2020   COLONOSCOPY  07/05/2018   COLONOSCOPY WITH PROPOFOL N/A 09/17/2017   Procedure: COLONOSCOPY WITH PROPOFOL;  Surgeon: [name], MD;  Location: [facility];  Service: Endoscopy;  Laterality: N/A;  Through ileostomy.   CYSTOSCOPY WITH STENT PLACEMENT Left 05/20/2017   Procedure: CYSTOSCOPY WITH STENT PLACEMENT;  Surgeon: [name], MD;  Location: [facility];  Service: Urology;  Laterality: Left;   ILEOSTOMY     IR NEPHROSTOMY EXCHANGE LEFT  04/12/2017   IR NEPHROSTOMY PLACEMENT LEFT  04/12/2017   IR NEPHROSTOMY PLACEMENT LEFT  04/29/2017   NEPHROLITHOTOMY Left 05/14/2017   Procedure: LEFT NEPHROLITHOTOMY PERCUTANEOUS WITH SURGEON ACCESS;  Surgeon: [name], MD;  Location: [facility];  Service: Urology;  Laterality: Left;   SMALL INTESTINE SURGERY     SUBTOTAL COLECTOMY      Prior to Admission medications   Medication Sig Start Date End Date Taking? Authorizing Provider  ondansetron (ZOFRAN) 4 MG tablet TAKE 1 TABLET EVERY 8 HOURS AS NEEDED FOR NASUEA OR VOMITING  09/25/21  Yes Jenascia Bumpass, Carlota Raspberry, MD  traMADol (ULTRAM) 50 MG tablet TAKE ONE TABLET EVERY 8 HOURS AS NEEDED FOR PAIN Patient taking differently: Take 50 mg by mouth daily. 04/11/21  Yes Shomari Matusik, Carlota Raspberry, MD  acetaminophen (TYLENOL) 500 MG tablet Take 500 mg by mouth every 6 (six) hours as needed for moderate pain.    [provider]  cyanocobalamin (,VITAMIN B-12,) 1000 MCG/ML injection Inject 1 mL (1,000 mcg total) into the muscle every 30 (thirty) days. 02/24/22   Judy Pollman, Carlota Raspberry, MD  diphenoxylate-atropine (LOMOTIL) 2.5-0.025 MG tablet TAKE ONE TABLET FOUR TIMES DAILY AS NEEDED FOR DIARRHEA OR LOOSE STOOLS 01/30/22   Lacee Grey, Carlota Raspberry, MD  Multiple Vitamin (MULTIVITAMIN WITH MINERALS) TABS tablet Take 1 tablet by mouth daily. 05/03/17   Rai, Vernelle Emerald, MD  omeprazole (PRILOSEC) 20 MG capsule Take 1 capsule (20 mg total) by mouth daily. 07/10/21   Nashay Brickley, Carlota Raspberry, MD  potassium chloride (KLOR-CON) 20 MEQ packet Take 20 mEq by mouth daily. 12/17/20   Bravery Ketcham, Carlota Raspberry, MD  sertraline (ZOLOFT) 50 MG tablet Take 1 tablet (50 mg total) by mouth daily. 08/26/17   Billie Ruddy, MD  ustekinumab (STELARA) 90 MG/ML SOSY injection Inject 1 mL (90 mg total) into the skin every 28 (twenty-eight) days. 03/24/22   Denman Pichardo, Carlota Raspberry, MD  Vitamin D, Ergocalciferol, 2000 units CAPS Take 4,000 Units by mouth daily.  [provider]    Current Outpatient Medications  Medication Sig Dispense Refill   ondansetron (ZOFRAN) 4 MG tablet TAKE 1 TABLET EVERY 8 HOURS AS NEEDED FOR NASUEA OR VOMITING 30 tablet 1   traMADol (ULTRAM) 50 MG tablet TAKE ONE TABLET EVERY 8 HOURS AS NEEDED FOR PAIN (Patient taking differently: Take 50 mg by mouth daily.) 90 tablet 0   acetaminophen (TYLENOL) 500 MG tablet Take 500 mg by mouth every 6 (six) hours as needed for moderate pain.     cyanocobalamin (,VITAMIN B-12,) 1000 MCG/ML injection Inject 1 mL (1,000 mcg total) into the muscle every 30  (thirty) days. 3 mL 1   diphenoxylate-atropine (LOMOTIL) 2.5-0.025 MG tablet TAKE ONE TABLET FOUR TIMES DAILY AS NEEDED FOR DIARRHEA OR LOOSE STOOLS 120 tablet 2   Multiple Vitamin (MULTIVITAMIN WITH MINERALS) TABS tablet Take 1 tablet by mouth daily. 30 tablet 3   omeprazole (PRILOSEC) 20 MG capsule Take 1 capsule (20 mg total) by mouth daily. 90 capsule 1   potassium chloride (KLOR-CON) 20 MEQ packet Take 20 mEq by mouth daily. 90 each 1   sertraline (ZOLOFT) 50 MG tablet Take 1 tablet (50 mg total) by mouth daily. 90 tablet 1   ustekinumab (STELARA) 90 MG/ML SOSY injection Inject 1 mL (90 mg total) into the skin every 28 (twenty-eight) days. 1 mL 6   Vitamin D, Ergocalciferol, 2000 units CAPS Take 4,000 Units by mouth daily.     Current Facility-Administered Medications  Medication Dose Route Frequency Provider Last Rate Last Admin   0.9 %  sodium chloride infusion  500 mL Intravenous Once Shariyah Eland, Carlota Raspberry, MD        Allergies as of 04/03/2022 - Review Complete 04/03/2022  Allergen Reaction Noted   Methotrexate derivatives Other (See Comments) 04/22/2017   Phenergan [promethazine hcl] Other (See Comments) 01/18/2018   Humira [adalimumab] Other (See Comments) 02/23/2017   Penicillins Other (See Comments) 02/23/2017   Remicade [infliximab] Other (See Comments) 02/23/2017    Family History  Problem Relation Age of Onset   Heart disease Mother    Diabetes Father    Heart disease Father    Heart attack Father    Cancer Sister    Heart disease Sister    Melanoma Sister    Crohn's disease Brother    Inflammatory bowel disease Brother    Diabetes Paternal Grandmother    Colon cancer Neg Hx    Rectal cancer Neg Hx    Stomach cancer Neg Hx    Esophageal cancer Neg Hx     Social History   Socioeconomic History   Marital status: Divorced    Spouse name: Not on file   Number of children: 2   Years of education: Not on file   Highest education level: Not on file   Occupational History   Occupation: Retired   Occupation: On disability  Tobacco Use   Smoking status: Former    Packs/day: 1.00    Years: 20.00    Total pack years: 20.00    Types: Cigarettes    Quit date: 08/31/2006    Years since quitting: 15.6   Smokeless tobacco: Never  Vaping Use   Vaping Use: Never used  Substance and Sexual Activity   Alcohol use: No   Drug use: No   Sexual activity: Not Currently  Other Topics Concern   Not on file  Social History Narrative   Not on file   Social Determinants of Radio broadcast assistant  Strain: Low Risk  (08/19/2021)   Overall Financial Resource Strain (CARDIA)    Difficulty of Paying Living Expenses: Not hard at all  Food Insecurity: No Food Insecurity (08/19/2021)   Hunger Vital Sign    Worried About Running Out of Food in the Last Year: Never true    Ran Out of Food in the Last Year: Never true  Transportation Needs: No Transportation Needs (08/19/2021)   PRAPARE - Hydrologist (Medical): No    Lack of Transportation (Non-Medical): No  Physical Activity: Insufficiently Active (08/19/2021)   Exercise Vital Sign    Days of Exercise per Week: 3 days    Minutes of Exercise per Session: 10 min  Stress: No Stress Concern Present (08/19/2021)   [place]    Feeling of Stress : Not at all  Social Connections: Moderately Isolated (08/13/2020)   Social Connection and Isolation Panel [NHANES]    Frequency of Communication with Friends and Family: More than three times a week    Frequency of Social Gatherings with Friends and Family: Never    Attends Religious Services: 1 to 4 times per year    Active Member of [employer] or Organizations: No    Attends Archivist Meetings: Never    Marital Status: Divorced  Human resources officer Violence: Not At Risk (08/13/2020)   Humiliation, Afraid, Rape, and Kick questionnaire    Fear of Current  or Ex-Partner: No    Emotionally Abused: No    Physically Abused: No    Sexually Abused: No    Review of Systems: All other review of systems negative except as mentioned in the HPI.  Physical Exam: Vital signs BP 104/64   Pulse 81   Temp 98 F (36.7 C)   Ht 5' 11"  (1.803 m)   Wt 126 lb (57.2 kg)   SpO2 97%   BMI 17.57 kg/m   General:   Alert,  Well-developed, pleasant and cooperative in NAD Lungs:  Clear throughout to auscultation.   Heart:  Regular rate and rhythm Abdomen:  Soft, nontender and nondistended.  Colostomy left abdomen. Neuro/Psych:  Alert and cooperative. Normal mood and affect. A and O x 3  Jolly Mango, MD [facility]

## 2022-04-03 NOTE — Progress Notes (Signed)
PT taken to PACU. Monitors in place. VSS. Report given to RN. 

## 2022-04-03 NOTE — Patient Instructions (Signed)
Thank you for coming in to see [place] today. Resume your diet and current medications today. Return to normal daily activities tomorrow. We will be in touch to discuss new treatment options with you once insurance has been notified.   YOU HAD AN ENDOSCOPIC PROCEDURE TODAY AT [facility]:   Refer to the procedure report that was given to you for any specific questions about what was found during the examination.  If the procedure report does not answer your questions, please call your gastroenterologist to clarify.  If you requested that your care partner not be given the details of your procedure findings, then the procedure report has been included in a sealed envelope for you to review at your convenience later.  YOU SHOULD EXPECT: Some feelings of bloating in the abdomen. Passage of more gas than usual.  Walking can help get rid of the air that was put into your GI tract during the procedure and reduce the bloating. If you had a lower endoscopy (such as a colonoscopy or flexible sigmoidoscopy) you may notice spotting of blood in your stool or on the toilet paper. If you underwent a bowel prep for your procedure, you may not have a normal bowel movement for a few days.  Please Note:  You might notice some irritation and congestion in your nose or some drainage.  This is from the oxygen used during your procedure.  There is no need for concern and it should clear up in a day or so.  SYMPTOMS TO REPORT IMMEDIATELY:  Following lower endoscopy (colonoscopy or flexible sigmoidoscopy):  Excessive amounts of blood in the stool  Significant tenderness or worsening of abdominal pains  Swelling of the abdomen that is new, acute  Fever of 100F or higher    For urgent or emergent issues, a gastroenterologist can be reached at any hour by calling [phone]. Do not use MyChart messaging for urgent concerns.    DIET:  We do recommend a small meal at first, but then you may proceed to  your regular diet.  Drink plenty of fluids but you should avoid alcoholic beverages for 24 hours.  ACTIVITY:  You should plan to take it easy for the rest of today and you should NOT DRIVE or use heavy machinery until tomorrow (because of the sedation medicines used during the test).    FOLLOW UP: Our staff will call the number listed on your records the next business day following your procedure.  We will call around 7:15- 8:00 am to check on you and address any questions or concerns that you may have regarding the information given to you following your procedure. If we do not reach you, we will leave a message.  If you develop any symptoms (ie: fever, flu-like symptoms, shortness of breath, cough etc.) before then, please call [phone].  If you test positive for Covid 19 in the 2 weeks post procedure, please call and report this information to [place].    If any biopsies were taken you will be contacted by phone or by letter within the next 1-3 weeks.  Please call us at [phone] if you have not heard about the biopsies in 3 weeks.    SIGNATURES/CONFIDENTIALITY: You and/or your care partner have signed paperwork which will be entered into your electronic medical record.  These signatures attest to the fact that that the information above on your After Visit Summary has been reviewed and is understood.  Full responsibility of the confidentiality of  this discharge information lies with you and/or your care-partner.

## 2022-04-06 ENCOUNTER — Ambulatory Visit: Payer: Medicare Other | Admitting: Internal Medicine

## 2022-04-06 ENCOUNTER — Telehealth: Payer: Self-pay | Admitting: *Deleted

## 2022-04-06 ENCOUNTER — Telehealth: Payer: Self-pay

## 2022-04-06 ENCOUNTER — Other Ambulatory Visit (HOSPITAL_COMMUNITY): Payer: Self-pay

## 2022-04-06 MED ORDER — RINVOQ 15 MG PO TB24: 1.0000 | ORAL_TABLET | Freq: Every day | ORAL | 11 refills | Status: DC

## 2022-04-06 MED ORDER — RINVOQ 45 MG PO TB24: 1.0000 | ORAL_TABLET | Freq: Every day | ORAL | 0 refills | Status: DC

## 2022-04-06 NOTE — Telephone Encounter (Signed)
RX Prior Auth team,   Pt has failed Stelara, active Crohn's disease on recent colonoscopy.  Please initiate PA for Rinvoq 45 mg daily for 12 weeks #84 tablets (starter dose), please also initiate PA for Rinvoq 15 mg daily (maintenance dose). Thanks

## 2022-04-06 NOTE — Telephone Encounter (Signed)
Patient Advocate Encounter  Received notification from [facility] that prior authorization for [facility] is required.   PA submitted on 8.7.23 45MG Key [id number] 15MG Key [id number] Status is pending    Luciano Cutter, CPhT Patient Advocate Phone: [phone]

## 2022-04-06 NOTE — Telephone Encounter (Signed)
  Follow up Call-     04/03/2022   11:01 AM 06/11/2020    9:32 AM  Call back number  Post procedure Call Back phone  # [phone]  Permission to leave phone message Yes Yes     Patient questions:  Do you have a fever, pain , or abdominal swelling? No. Pain Score  0 *  Have you tolerated food without any problems? Yes.    Have you been able to return to your normal activities? Yes.    Do you have any questions about your discharge instructions: Diet   No. Medications  No. Follow up visit  No.  Do you have questions or concerns about your Care? No.  Action

## 2022-04-06 NOTE — Telephone Encounter (Signed)
Called and spoke with patient. He is aware that I will send RX for Rinvoq starter and maintenance dose to his pharmacy on file. Pt is aware that this medication will likely need a PA through his insurance. Pt has been advised to follow up with his pharmacy in a few days. Pt will let us know if he has any issues getting this medication filled. Pt has been advised to stop Stelara once he start Rinvoq. Pt verbalized understanding and had no concerns at the end of the call.

## 2022-04-06 NOTE — Telephone Encounter (Signed)
-----   Message from Yetta Flock, MD sent at 04/03/2022  6:10 PM EDT ----- Regarding: Change in therapy Colonoscopy today showed really active Crohn's disease despite his Stelara.  He is clearly failing Stelara.  I would like to start him on Rinvoq if his insurance covers this.  Dosing would be 45 mg once daily for 12 weeks; maintenance: 15 mg once daily.  Anyway we can clarify if his insurance covers this or not?  Thanks  Dr. Havery Moros

## 2022-04-06 NOTE — Telephone Encounter (Signed)
Test billing results showed that medication was successfully billed on insurance. [place] to verify and they are unable to fill this medication due to cost. Resubmitted test bill and received a $0 copay (for 63m) as a transitional fill. Will submit PA request for both 430mand 151mPlease [facility] for [facility] to receive medication.

## 2022-04-07 ENCOUNTER — Other Ambulatory Visit (HOSPITAL_COMMUNITY): Payer: Self-pay

## 2022-04-08 ENCOUNTER — Other Ambulatory Visit (HOSPITAL_COMMUNITY): Payer: Self-pay

## 2022-04-08 NOTE — Telephone Encounter (Signed)
Patient Advocate Encounter  Prior Authorization for RINVOQ 45MG has been approved.    PA#  [id number] Effective dates: 8.8.23 through 12.31.23  Max Nuno B. CPhT P: [phone] F: [phone]   RINVOQ 15MG IS STILL PENDING

## 2022-04-09 ENCOUNTER — Other Ambulatory Visit (HOSPITAL_COMMUNITY): Payer: Self-pay

## 2022-04-10 ENCOUNTER — Other Ambulatory Visit (HOSPITAL_COMMUNITY): Payer: Self-pay

## 2022-04-10 ENCOUNTER — Telehealth: Payer: Self-pay | Admitting: Gastroenterology

## 2022-04-10 NOTE — Telephone Encounter (Signed)
Sharyn Lull from Patient pharmacy called. Requested a call back at [phone].

## 2022-04-10 NOTE — Telephone Encounter (Signed)
Returned call to [place]. Sharyn Lull wanted to confirm that we are transferring patient's prescription to a specialty pharmacy. I informed her that we will transfer the prescription once we have the approval for the maintenance dose as well. I informed Sharyn Lull that patient is approved for the starter dose but we did not want him to start the medication and not be able to continue if maintenance dose is not approved through his insurance. Sharyn Lull was thankful for the information. She stated that she would call the patient and let him know. Sharyn Lull had no other concerns at the end of the call.

## 2022-04-10 NOTE — Telephone Encounter (Signed)
Any update on PA for maintenance dose, Rinvoq 15 mg?

## 2022-04-13 ENCOUNTER — Other Ambulatory Visit (HOSPITAL_COMMUNITY): Payer: Self-pay

## 2022-04-13 MED ORDER — RINVOQ 15 MG PO TB24
1.0000 | ORAL_TABLET | Freq: Every day | ORAL | 11 refills | Status: DC
  Filled 2022-04-13: qty 30, fill #0
  Filled 2022-04-17: qty 30, 30d supply, fill #0

## 2022-04-13 MED ORDER — RINVOQ 45 MG PO TB24
1.0000 | ORAL_TABLET | Freq: Every day | ORAL | 0 refills | Status: DC
  Filled 2022-04-13: qty 84, fill #0
  Filled 2022-04-14: qty 28, 28d supply, fill #0
  Filled 2022-05-05: qty 28, 28d supply, fill #1
  Filled 2022-06-02: qty 28, 28d supply, fill #2

## 2022-04-13 NOTE — Telephone Encounter (Signed)
Ran test claim for 16m dose. Received paid claim- zero copay. PA approved through 08/30/22 will cover both doses.

## 2022-04-13 NOTE — Telephone Encounter (Signed)
Called and spoke with patient regarding Rinvoq prescription. I informed pt that we are transferring his prescriptions to [facility] and they will deliver the medication to him. Pt is aware that his co-pay should be $0. Pt verbalized understanding and had no concerns at the end of the call.   I called [facility] to confirm that they will deliver medication to patient. I confirmed pt's address on file for them. They will mail medication to patient and will contact him for payment if he has a co-pay.

## 2022-04-14 ENCOUNTER — Other Ambulatory Visit (HOSPITAL_COMMUNITY): Payer: Self-pay

## 2022-04-15 ENCOUNTER — Other Ambulatory Visit (HOSPITAL_COMMUNITY): Payer: Self-pay

## 2022-04-17 ENCOUNTER — Other Ambulatory Visit (HOSPITAL_COMMUNITY): Payer: Self-pay

## 2022-04-22 DIAGNOSIS — Z933 Colostomy status: Secondary | ICD-10-CM | POA: Diagnosis not present

## 2022-04-27 ENCOUNTER — Other Ambulatory Visit: Payer: Self-pay | Admitting: Gastroenterology

## 2022-05-03 ENCOUNTER — Encounter: Payer: Self-pay | Admitting: Gastroenterology

## 2022-05-04 ENCOUNTER — Encounter: Payer: Self-pay | Admitting: Gastroenterology

## 2022-05-05 ENCOUNTER — Telehealth: Payer: Self-pay | Admitting: Gastroenterology

## 2022-05-05 ENCOUNTER — Other Ambulatory Visit (HOSPITAL_COMMUNITY): Payer: Self-pay

## 2022-05-05 ENCOUNTER — Encounter: Payer: Self-pay | Admitting: Gastroenterology

## 2022-05-05 ENCOUNTER — Other Ambulatory Visit (INDEPENDENT_AMBULATORY_CARE_PROVIDER_SITE_OTHER): Payer: Medicare Other

## 2022-05-05 ENCOUNTER — Other Ambulatory Visit: Payer: Self-pay | Admitting: Gastroenterology

## 2022-05-05 DIAGNOSIS — E538 Deficiency of other specified B group vitamins: Secondary | ICD-10-CM

## 2022-05-05 DIAGNOSIS — Z79899 Other long term (current) drug therapy: Secondary | ICD-10-CM

## 2022-05-05 DIAGNOSIS — E559 Vitamin D deficiency, unspecified: Secondary | ICD-10-CM | POA: Diagnosis not present

## 2022-05-05 DIAGNOSIS — K50019 Crohn's disease of small intestine with unspecified complications: Secondary | ICD-10-CM

## 2022-05-05 DIAGNOSIS — K50819 Crohn's disease of both small and large intestine with unspecified complications: Secondary | ICD-10-CM | POA: Diagnosis not present

## 2022-05-05 DIAGNOSIS — D649 Anemia, unspecified: Secondary | ICD-10-CM | POA: Diagnosis not present

## 2022-05-05 LAB — COMPREHENSIVE METABOLIC PANEL
ALT: 9 U/L (ref 0–53)
AST: 11 U/L (ref 0–37)
Albumin: 3.4 g/dL — ABNORMAL LOW (ref 3.5–5.2)
Alkaline Phosphatase: 76 U/L (ref 39–117)
BUN: 12 mg/dL (ref 6–23)
CO2: 20 mEq/L (ref 19–32)
Calcium: 8.2 mg/dL — ABNORMAL LOW (ref 8.4–10.5)
Chloride: 111 mEq/L (ref 96–112)
Creatinine, Ser: 1.5 mg/dL (ref 0.40–1.50)
GFR: 50.1 mL/min — ABNORMAL LOW (ref >60.00)
Glucose, Bld: 100 mg/dL — ABNORMAL HIGH (ref 70–99)
Potassium: 3.7 mEq/L (ref 3.5–5.1)
Sodium: 140 mEq/L (ref 135–145)
Total Bilirubin: 2 mg/dL — ABNORMAL HIGH (ref 0.2–1.2)
Total Protein: 5.8 g/dL — ABNORMAL LOW (ref 6.0–8.3)

## 2022-05-05 LAB — CBC WITH DIFFERENTIAL/PLATELET
Basophils Absolute: 0 10*3/uL (ref 0.0–0.1)
Basophils Relative: 0.3 % (ref 0.0–3.0)
Eosinophils Absolute: 0.1 10*3/uL (ref 0.0–0.7)
Eosinophils Relative: 0.5 % (ref 0.0–5.0)
HCT: 38.3 % — ABNORMAL LOW (ref 39.0–52.0)
Hemoglobin: 12.3 g/dL — ABNORMAL LOW (ref 13.0–17.0)
Lymphocytes Relative: 12.3 % (ref 12.0–46.0)
Lymphs Abs: 1.9 10*3/uL (ref 0.7–4.0)
MCHC: 32.3 g/dL (ref 30.0–36.0)
MCV: 101 fl — ABNORMAL HIGH (ref 78.0–100.0)
Monocytes Absolute: 1 10*3/uL (ref 0.1–1.0)
Monocytes Relative: 6.5 % (ref 3.0–12.0)
Neutro Abs: 12.2 10*3/uL — ABNORMAL HIGH (ref 1.4–7.7)
Neutrophils Relative %: 80.4 % — ABNORMAL HIGH (ref 43.0–77.0)
Platelets: 200 10*3/uL (ref 150.0–400.0)
RBC: 3.79 Mil/uL — ABNORMAL LOW (ref 4.22–5.81)
RDW: 15.2 % (ref 11.5–15.5)
WBC: 15.2 10*3/uL — ABNORMAL HIGH (ref 4.0–10.5)

## 2022-05-05 MED ORDER — METRONIDAZOLE 500 MG PO TABS: 500.0000 mg | ORAL_TABLET | Freq: Three times a day (TID) | ORAL | 0 refills | Status: DC

## 2022-05-05 MED ORDER — DICYCLOMINE HCL 10 MG PO CAPS: 10.0000 mg | ORAL_CAPSULE | Freq: Three times a day (TID) | ORAL | 1 refills | Status: DC | PRN

## 2022-05-05 NOTE — Telephone Encounter (Signed)
Returned call to patient. Pt has sent several MyChart messages over the weekend with the same concerns. Pt states that he has been on Rinvoq 45 mg for about 3 weeks. About 2 weeks into treatment he noticed that his stools were becoming more formed. He did have to strain some to have a BM. Pt reports that 2 days ago he started having SOB, he states that he was outdoors but not doing anything strenuous. Pt state that he does not feel right, he feels strange. He also states that he had intestinal cramping last night. He has also been having pain at his colostomy site, he thought that he was placing the colostomy bag too tight, but that is not the case. Pt states that colostomy site is pink and has no swelling. Pt is concerned that he may need labs or thinks that Rinvoq is causing his problems. I told pt that having a formed stool is good since he was previously having diarrhea. I also told pt that it was unlikely that Rinvoq was causing his SOB since he has been on it for several weeks and is just now experiencing this. I told him that I would send a message for your recommendations. Thanks

## 2022-05-05 NOTE — Telephone Encounter (Signed)
[place] thanks for the update, sorry to hear this.  Can you make sure he is not having any chest pains? No edema in his legs or swelling? He should contact his PCP about shortness of breath, hopefully mild, if severe let me know. We can do a CXR PA / Lat to make sure okay. He can come to the lab today for CBC and CMET if you can help order. If he is having formed stools, he should back off his lomotil / immodium, use Miralax as needed for constipation. Rinvoq could be putting his Crohn's into remission, perhaps he may not need the antidiarrheals.

## 2022-05-05 NOTE — Telephone Encounter (Signed)
Called and spoke with patient. He states that he is now actually feeling better since this morning. He is still coming by for labs to make sure those are OK. Pt states that he is going to continue Rinvoq, he thinks that you were right about him possibly being in remission and his bowels are not used to having more solid stools. Pt stated that he was still taking Imodium, but he will stop. He would like to try RX for Bentyl PRN. He would like RX sent to [facility] on file. Pt also states that he does not need an appt, his stoma looks fine. He states that he just got really nervous and thought something was wrong. Pt knows that we will contact him with lab results. He knows to contact us if he has any other concerns. Pt verbalized understanding.

## 2022-05-05 NOTE — Telephone Encounter (Signed)
Its okay if he wants to hold it a few days but I am not convinced the Rinvoq is causing his pain. Let's see what his labs show. Can give some bentyl 46m every 8 hours PRN if he is not taking anything. May be good to see if he can be seen in the office this week by me or APP if any openings to further evaluate., make sure no issue with the stoma itself. Thanks

## 2022-05-05 NOTE — Telephone Encounter (Signed)
Returned call to patient with your recommendations as outlined below. He denies any chest pain or swelling. Pt states that he is scared to take another Rinvoq due to the pain at his colostomy site. I told pt that the only way to know if Rinvoq is causing the pain is to hold it for a few days, but I told him that I would check with you because I am not sure if Rinvoq can be stopped abruptly. He reports pain in his intestines. He will come by for labs today.

## 2022-05-05 NOTE — Telephone Encounter (Signed)
Inbound call from patient again. Please give a call to further advise. Thank you

## 2022-05-05 NOTE — Telephone Encounter (Signed)
Okay thanks [place] appreciate the follow up

## 2022-05-05 NOTE — Telephone Encounter (Signed)
Patient called, states his Rinvoq 45 mg, had been making him sick and causing cramps. Requesting a call back as soon as possible,. Please call to advise.

## 2022-05-06 ENCOUNTER — Encounter (HOSPITAL_COMMUNITY): Payer: Self-pay | Admitting: Radiology

## 2022-05-06 ENCOUNTER — Ambulatory Visit (HOSPITAL_COMMUNITY)
Admission: RE | Admit: 2022-05-06 | Discharge: 2022-05-06 | Disposition: A | Discharge status: Home / Self Care | Payer: Medicare Other | Source: Ambulatory Visit | Attending: Gastroenterology | Admitting: Gastroenterology

## 2022-05-06 ENCOUNTER — Other Ambulatory Visit: Payer: Self-pay

## 2022-05-06 ENCOUNTER — Encounter: Payer: Self-pay | Admitting: Gastroenterology

## 2022-05-06 DIAGNOSIS — Z933 Colostomy status: Secondary | ICD-10-CM | POA: Diagnosis not present

## 2022-05-06 DIAGNOSIS — R1032 Left lower quadrant pain: Secondary | ICD-10-CM | POA: Diagnosis not present

## 2022-05-06 DIAGNOSIS — Z79899 Other long term (current) drug therapy: Secondary | ICD-10-CM | POA: Insufficient documentation

## 2022-05-06 DIAGNOSIS — K50019 Crohn's disease of small intestine with unspecified complications: Secondary | ICD-10-CM | POA: Insufficient documentation

## 2022-05-06 DIAGNOSIS — K6389 Other specified diseases of intestine: Secondary | ICD-10-CM | POA: Diagnosis not present

## 2022-05-06 DIAGNOSIS — K56609 Unspecified intestinal obstruction, unspecified as to partial versus complete obstruction: Secondary | ICD-10-CM

## 2022-05-06 DIAGNOSIS — Z9049 Acquired absence of other specified parts of digestive tract: Secondary | ICD-10-CM | POA: Diagnosis not present

## 2022-05-06 DIAGNOSIS — Z9889 Other specified postprocedural states: Secondary | ICD-10-CM | POA: Diagnosis not present

## 2022-05-06 MED ORDER — IOHEXOL 300 MG/ML  SOLN
100.0000 mL | Freq: Once | INTRAMUSCULAR | Status: AC | PRN
  Administered 2022-05-06: 100 mL via INTRAVENOUS

## 2022-05-08 ENCOUNTER — Other Ambulatory Visit (HOSPITAL_COMMUNITY): Payer: Self-pay

## 2022-05-08 ENCOUNTER — Telehealth: Payer: Self-pay

## 2022-05-08 ENCOUNTER — Other Ambulatory Visit (INDEPENDENT_AMBULATORY_CARE_PROVIDER_SITE_OTHER): Payer: Medicare Other

## 2022-05-08 DIAGNOSIS — Z933 Colostomy status: Secondary | ICD-10-CM

## 2022-05-08 DIAGNOSIS — Z79899 Other long term (current) drug therapy: Secondary | ICD-10-CM

## 2022-05-08 DIAGNOSIS — K50019 Crohn's disease of small intestine with unspecified complications: Secondary | ICD-10-CM | POA: Diagnosis not present

## 2022-05-08 LAB — CBC WITH DIFFERENTIAL/PLATELET
Basophils Absolute: 0.1 10*3/uL (ref 0.0–0.1)
Basophils Relative: 0.6 % (ref 0.0–3.0)
Eosinophils Absolute: 0.5 10*3/uL (ref 0.0–0.7)
Eosinophils Relative: 4.5 % (ref 0.0–5.0)
HCT: 38.8 % — ABNORMAL LOW (ref 39.0–52.0)
Hemoglobin: 12.8 g/dL — ABNORMAL LOW (ref 13.0–17.0)
Lymphocytes Relative: 12.8 % (ref 12.0–46.0)
Lymphs Abs: 1.3 10*3/uL (ref 0.7–4.0)
MCHC: 32.9 g/dL (ref 30.0–36.0)
MCV: 101.4 fl — ABNORMAL HIGH (ref 78.0–100.0)
Monocytes Absolute: 0.9 10*3/uL (ref 0.1–1.0)
Monocytes Relative: 8.7 % (ref 3.0–12.0)
Neutro Abs: 7.5 10*3/uL (ref 1.4–7.7)
Neutrophils Relative %: 73.4 % (ref 43.0–77.0)
Platelets: 208 10*3/uL (ref 150.0–400.0)
RBC: 3.82 Mil/uL — ABNORMAL LOW (ref 4.22–5.81)
RDW: 14.8 % (ref 11.5–15.5)
WBC: 10.2 10*3/uL (ref 4.0–10.5)

## 2022-05-08 NOTE — Telephone Encounter (Signed)
Patient due for repeat CBC today. Called and spoke with patient. He states that he is going to eat and he should be here in next hour for labs. Pt had no concerns at the end of the call.

## 2022-05-18 ENCOUNTER — Ambulatory Visit (HOSPITAL_COMMUNITY)
Admission: RE | Admit: 2022-05-18 | Discharge: 2022-05-18 | Disposition: A | Discharge status: Home / Self Care | Payer: Medicare Other | Source: Ambulatory Visit | Attending: Gastroenterology | Admitting: Gastroenterology

## 2022-05-18 DIAGNOSIS — L24B3 Irritant contact dermatitis related to fecal or urinary stoma or fistula: Secondary | ICD-10-CM | POA: Diagnosis not present

## 2022-05-18 DIAGNOSIS — K602 Anal fissure, unspecified: Secondary | ICD-10-CM

## 2022-05-18 DIAGNOSIS — K9400 Colostomy complication, unspecified: Secondary | ICD-10-CM

## 2022-05-18 DIAGNOSIS — Z933 Colostomy status: Secondary | ICD-10-CM | POA: Insufficient documentation

## 2022-05-18 NOTE — Progress Notes (Signed)
[facility]   Reason for visit:  LLQ colostomy.  Recent nonintact skin to peristomal area.  Painful and decreased wear time.  He is on a chronic medication that makes his stool loose.  He wears both closed and and roll closed pouches.  2 piece convatec system.  He has been in his current system for over 20 years.  I encourage him that we can improve his pouching system with minimal changes. He still has a lot of ostomy supplies and understandably doesn't want to waste those HPI:  Crohn's disease with colostomy ROS  Review of Systems  Gastrointestinal:        LLQ colostomy Discoloration to peristomal skin  Skin:  Positive for wound.       Peristomal fissure  All other systems reviewed and are negative.  Vital signs:  BP 120/84 (BP Location: Right Arm)   Pulse 75   Temp 98.1 F (36.7 C) (Oral)   Resp 17   SpO2 99%  Exam:  Physical Exam Vitals reviewed.  Constitutional:      Appearance: Normal appearance.  Abdominal:     General: Abdomen is flat.     Palpations: Abdomen is soft.  Skin:    Findings: Lesion and rash present.  Neurological:     Mental Status: He is alert and oriented to person, place, and time.  Psychiatric:        Mood and Affect: Mood normal.        Behavior: Behavior normal.     Stoma type/location:  1 1/4" stoma. Patient is cutting the barrier to 1".  He states if he cuts it larger, the system leaks onto his skin.  Stomal assessment/size:  1 1/4"  stoma is smaller at the bottom due to the constriction of the stoma from the barrier opening being too small.  It is pink, patent and producing liquid brown stool Peristomal assessment:  Fissure along top half of stoma, from 9 o'clock to 3 o'clock.  This trauma is from the opening being too small.  This creates friction and trauma.   Treatment options for stomal/peristomal skin: I demonstrate a barrier ring around the stoma.  This will protect the fissure, promote healing and protect peristomal skin.  I  measure stoma, explain it is 1 1/4" and send him home with the pattern to cut ongoing bags.  He  immediately notices that the pouch feels better in the nonintact area.  I will call [facility] and add barrier rings to his order.   DUe to the liquid stool, the convatec DUrahesive instead of NAturahesive barrier is designed for liquid output instead of semi formed stool.  This would increase his wear time.  I am going to have samples sent to his home for him to try. IF he likes this product, at his next order, he can switch to this.  It is compatible with the same bags he already wears.   Output: liquid brown stool Ostomy pouching: 2pc. Convatec pouch  added barrier ring  Education provided:  see above  add barrier ring    Impression/dx  Contact dermatitis colostomy Discussion  Add barrier ring Consider new barrier designed for liquid output Plan  See back as needed.   Will update order with [facility]    Visit time: 50 minutes.   Domenic Moras FNP-BC

## 2022-05-18 NOTE — Discharge Instructions (Signed)
Add barrier ring (Sent home with 2 to try) I will add this to supply order Samples coming for barrier that will hold up better to liquid stool  Would continue same pouch

## 2022-05-28 ENCOUNTER — Encounter: Payer: Self-pay | Admitting: Family Medicine

## 2022-06-02 ENCOUNTER — Other Ambulatory Visit (HOSPITAL_COMMUNITY): Payer: Self-pay

## 2022-06-03 DIAGNOSIS — S81012A Laceration without foreign body, left knee, initial encounter: Secondary | ICD-10-CM | POA: Diagnosis not present

## 2022-06-03 DIAGNOSIS — W01198A Fall on same level from slipping, tripping and stumbling with subsequent striking against other object, initial encounter: Secondary | ICD-10-CM | POA: Diagnosis not present

## 2022-06-04 ENCOUNTER — Other Ambulatory Visit (HOSPITAL_COMMUNITY): Payer: Self-pay

## 2022-06-16 ENCOUNTER — Other Ambulatory Visit (HOSPITAL_COMMUNITY): Payer: Self-pay

## 2022-06-16 ENCOUNTER — Telehealth: Payer: Self-pay

## 2022-06-16 ENCOUNTER — Ambulatory Visit: Payer: Medicare Other | Admitting: Gastroenterology

## 2022-06-16 ENCOUNTER — Encounter: Payer: Self-pay | Admitting: Gastroenterology

## 2022-06-16 VITALS — BP 106/60 | HR 68 | Ht 71.0 in | Wt 130.0 lb

## 2022-06-16 DIAGNOSIS — K50018 Crohn's disease of small intestine with other complication: Secondary | ICD-10-CM

## 2022-06-16 DIAGNOSIS — Z79899 Other long term (current) drug therapy: Secondary | ICD-10-CM

## 2022-06-16 DIAGNOSIS — Z933 Colostomy status: Secondary | ICD-10-CM

## 2022-06-16 MED ORDER — RINVOQ 15 MG PO TB24
2.0000 | ORAL_TABLET | Freq: Every day | ORAL | 11 refills | Status: DC
  Filled 2022-06-16: qty 60, fill #0
  Filled 2022-06-26 – 2022-07-03 (×2): qty 60, 30d supply, fill #0

## 2022-06-16 NOTE — Progress Notes (Signed)
HPI :  Crohn's history Diagnosed with Crohn's disease in 17s. 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 He has a colostomy with a remnant rectal pouch. He reports he has had a bowel surgery in the 1982, with a "few more surgeries" in years after while in [place], as well as in [facility], they tried reversing his colostomy which did not work well for him, too much diarrhea and it was then reversed back. He has a history of abscess associated with small bowel.    He has been hospitalized multiple times. He previously had been on chronic prednisone for 10 years with fluctuating levels. He was on Cimzia for about 4-5 years in the past.  He has been on sulfasalazine for a long time. He reports an allergy to 6MP - he does not know the details of the reaction. He had an allergic reaction - hives, to both Remicade and Humira. Allergy to methotrexate. Failed therapeutic Entyvio, then on Stelara since March 2019, with dosing increase to once monthly. Initially did well with Stelara but ultimately failed. Colonoscopy 2023 showed ongoing active disease despite high dose Stelara, changed to Rinvoq August 2023.     SINCE LAST VISIT   61 year old male here for follow-up visit for his Crohn's disease.  Recall he had been on Stelara for a few years now, this definitely helped him at first however had been having some intermittent symptoms over time.  Eventually had escalating doses of Stelara to once every 4 weeks.  Colonoscopy done in August showed active disease as outlined below.  Stelara was stopped, he has started Rinvoq as of this past August.  He is on 45 mg a day and appears to be tolerating it well.  Very generally he has been doing pretty well since I have last seen him on the regimen.  Shortly after starting Rinvoq he had some abdominal discomfort that led him to the ED, CT scan showed some inflammation of his ileum but no obstructive pathology.  He  had taken some antidiarrheals and thought potentially he got constipated leading to his symptoms.  Generally, with more time on Rinvoq he has had more energy, his appetite has improved.  He has gained weight, now up to 130 pounds which is really good for him.  He thinks overall it has definitely helped him.  He has had good output from his ostomy without any blood.  He had an episode of pain recently in his right lower quadrant that lasted about 2 or 3 days, denied any nausea or vomiting with this or symptoms of obstruction otherwise.  He took some Bentyl, eventually it went away.  He did have some old Stelara at home and he took a dose of this without discussing with [place].  Otherwise he states he has had the new COVID-vaccine, the new flu vaccine, his shingles vaccine is up-to-date. He is not using any tobacco.   Prior evaluation: EGD 03/21/2009 - normal   Colonoscopy 06/11/20 - Localized inflammation, graded as Rutgeerts Score i2 and characterized by shallow ulcerations was found at the surgical anastomosis. In the distal 10cm of ileum there were scattered small ulcerations with normal intervening mucosa. The small bowel was deeply intubated and mucosa proximal to the distal 10cm of ileum was entirely normal. Scattered medium-mouthed diverticula were found in the entire colon. The exam was otherwise without abnormality. No colonic inflammation. No polyps.   IBD Health Care Maintenance: Annual Flu Vaccine - 2023  UTD Pneumococcal Vaccine if receiving immunosuppression: - PCV 13 01/10/18, PPSV23 04/10/19 Zoster vaccine if over age 29: 06/28/19 TB testing if on anti-TNF, yearly -07/2021 Vitamin D screening - level > 30 07/09/21 Last Colonoscopy - 10/21 COVID 19 vaccine UTD 2023   CT 12/19/19 - IMPRESSION: 1. Progressive wall thickening involving a loop of ileum in the left upper and mid pelvis, distal to an anastomosis in the left mid abdomen. This is compatible mildly progressive thickening  associated with the patient's known Crohn's disease. A neoplastic process can not be excluded but is less likely. 2. 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 3. Pronounced bullous changes in the inferior left upper lobe.   Colonoscopy 04/03/22: - Diffuse inflammation, graded as Rutgeerts Score i3 (diffuse aphthous ileitis) was found in the terminal ileum and at the surgical anastomosis. - There was evidence of a prior end-to-end ileo-colonic anastomosis in the ascending colon. This was patent and was characterized by inflammation. Inflammation slightly worse than the last exam. - A few small-mouthed diverticula were found in the entire colon. - The exam was otherwise without abnormality. The ostomy was normal. Prep adequate but some residual seeds / nuts which made it difficult to view a small area of the colon in the hepatic flexure   CT scan abdomen / pelvis with contrast 05/06/22: IMPRESSION: 1. Status post right hemicolectomy and ileocolic anastomosis, in addition to sigmoid colon resection and left lower quadrant end colostomy. 2. In comparison to prior examination dated 12/19/2019, very similar appearance of a loop of thickened, narrowed terminal ileum in the midline ventral abdomen measuring approximately 15 cm in length , which is however without evidence of obstruction or overt stricture at this time. Enteric contrast has transited to the left lower quadrant ostomy. Findings are consistent with Crohn's disease and related postoperative findings, with evidence of active inflammation in the terminal ileum.   Past Medical History:  Diagnosis Date   Acute endocarditis    Blood transfusion without reported diagnosis    COVID 05/2021   Crohn's disease ([place])    Depression    Eating disorder    GERD (gastroesophageal reflux disease)    Kidney stones    Osteoporosis    Short gut syndrome      Past Surgical History:  Procedure Laterality Date    COLONOSCOPY  06/11/2020   COLONOSCOPY  07/05/2018   COLONOSCOPY WITH PROPOFOL N/A 09/17/2017   Procedure: COLONOSCOPY WITH PROPOFOL;  Surgeon: [name], MD;  Location: [facility];  Service: Endoscopy;  Laterality: N/A;  Through ileostomy.   CYSTOSCOPY WITH STENT PLACEMENT Left 05/20/2017   Procedure: CYSTOSCOPY WITH STENT PLACEMENT;  Surgeon: [name], MD;  Location: [facility];  Service: Urology;  Laterality: Left;   ILEOSTOMY     IR NEPHROSTOMY EXCHANGE LEFT  04/12/2017   IR NEPHROSTOMY PLACEMENT LEFT  04/12/2017   IR NEPHROSTOMY PLACEMENT LEFT  04/29/2017   NEPHROLITHOTOMY Left 05/14/2017   Procedure: LEFT NEPHROLITHOTOMY PERCUTANEOUS WITH SURGEON ACCESS;  Surgeon: [name], MD;  Location: [facility];  Service: Urology;  Laterality: Left;   SMALL INTESTINE SURGERY     SUBTOTAL COLECTOMY     Family History  Problem Relation Age of Onset   Heart disease Mother    Diabetes Father    Heart disease Father    Heart attack Father    Cancer Sister    Heart disease Sister    Melanoma Sister    Crohn's disease Brother  Inflammatory bowel disease Brother    Diabetes Paternal Grandmother    Colon cancer Neg Hx    Rectal cancer Neg Hx    Stomach cancer Neg Hx    Esophageal cancer Neg Hx    Social History   Tobacco Use   Smoking status: Former    Packs/day: 1.00    Years: 20.00    Total pack years: 20.00    Types: Cigarettes    Quit date: 08/31/2006    Years since quitting: 15.8   Smokeless tobacco: Never  Vaping Use   Vaping Use: Never used  Substance Use Topics   Alcohol use: No   Drug use: No   Current Outpatient Medications  Medication Sig Dispense Refill   acetaminophen (TYLENOL) 500 MG tablet Take 500 mg by mouth every 6 (six) hours as needed for moderate pain.     cyanocobalamin (,VITAMIN B-12,) 1000 MCG/ML injection Inject 1 mL (1,000 mcg total) into the muscle every 30 (thirty) days. 3 mL 1   dicyclomine (BENTYL) 10 MG capsule Take 1 capsule (10  mg total) by mouth every 8 (eight) hours as needed (abdominal cramping). 30 capsule 1   diphenoxylate-atropine (LOMOTIL) 2.5-0.025 MG tablet TAKE ONE TABLET FOUR TIMES DAILY AS NEEDED FOR DIARRHEA OR LOOSE STOOLS 120 tablet 2   metroNIDAZOLE (FLAGYL) 500 MG tablet Take 1 tablet (500 mg total) by mouth 3 (three) times daily. 21 tablet 0   Multiple Vitamin (MULTIVITAMIN WITH MINERALS) TABS tablet Take 1 tablet by mouth daily. 30 tablet 3   omeprazole (PRILOSEC) 20 MG capsule TAKE ONE CAPSULE ONCE DAILY 90 capsule 1   ondansetron (ZOFRAN) 4 MG tablet TAKE 1 TABLET EVERY 8 HOURS AS NEEDED FOR [place] OR VOMITING 30 tablet 1   potassium chloride (KLOR-CON) 20 MEQ packet Take 20 mEq by mouth daily. 90 each 1   sertraline (ZOLOFT) 50 MG tablet Take 1 tablet (50 mg total) by mouth daily. 90 tablet 1   traMADol (ULTRAM) 50 MG tablet TAKE ONE TABLET EVERY 8 HOURS AS NEEDED FOR PAIN (Patient taking differently: Take 50 mg by mouth daily.) 90 tablet 0   Upadacitinib ER (RINVOQ) 15 MG TB24 Take 1 tablet by mouth daily. Maintenance dose 30 tablet 11   Upadacitinib ER (RINVOQ) 45 MG TB24 Take 1 tablet by mouth daily. For 12 weeks - starter dose 84 tablet 0   Vitamin D, Ergocalciferol, 2000 units CAPS Take 4,000 Units by mouth daily.     No current facility-administered medications for this visit.   Allergies  Allergen Reactions   Methotrexate Derivatives Other (See Comments)    Anemia, low WBC, severe GI symptoms,    Phenergan [Promethazine Hcl] Other (See Comments)    twitching   Humira [Adalimumab] Other (See Comments)    Intolerance   Penicillins Other (See Comments)    REACTION DURING SURGERY Has patient had a PCN reaction causing immediate rash, facial/tongue/throat swelling, SOB or lightheadedness with hypotension: Unknown Has patient had a PCN reaction causing severe rash involving mucus membranes or skin necrosis: Unknown Has patient had a PCN reaction that required hospitalization: Unknown Has  patient had a PCN reaction occurring within the last 10 years: No If all of the above answers are "NO", then may proceed with Cephalosporin use.    Remicade [Infliximab] Other (See Comments)    Intolerance     Review of Systems: All systems reviewed and negative except where noted in HPI.   Lab Results  Component Value Date   WBC  10.2 05/08/2022   HGB 12.8 (L) 05/08/2022   HCT 38.8 (L) 05/08/2022   MCV 101.4 (H) 05/08/2022   PLT 208.0 05/08/2022    Lab Results  Component Value Date   CREATININE 1.50 05/05/2022   BUN 12 05/05/2022   NA 140 05/05/2022   K 3.7 05/05/2022   CL 111 05/05/2022   CO2 20 05/05/2022    Lab Results  Component Value Date   ALT 9 05/05/2022   AST 11 05/05/2022   ALKPHOS 76 05/05/2022   BILITOT 2.0 (H) 05/05/2022     Physical Exam: BP 106/60   Pulse 68   Ht 5' 11"  (1.803 m)   Wt 130 lb (59 kg)   BMI 18.13 kg/m  Constitutional: Pleasant,well-developed, male in no acute distress. Abdominal: Soft, nontender. Ostomy in LLQ. There are no masses palpable.  Neurological: Alert and oriented to person place and time. Psychiatric: Normal mood and affect. Behavior is normal.   ASSESSMENT: 61 y.o. male here for assessment of the following  1. Crohn's disease of small intestine with other complication ([place])   2. Colostomy in place [facility])   3. High risk medication use    Crohn's history as outlined above.  He has failed multiple regimens, has aggressive/severe disease.  Most recently failed high-dose Stelara after having done well on this for a few years.  He was transitioned to Rinvoq most recently and generally has been doing really well.  CT scan was done shortly after starting this, he has known ileitis, suspect he also potentially might have some fibrotic disease there as well.  He has had improved symptoms over time with weight gain and more energy in recent weeks, I do think Rinvoq is helping.  He had some short-lived abdominal discomfort  recently but no symptoms of obstruction otherwise.  Discussed Rinvoq in general, his vaccines are up-to-date.  He will need labs at the 12-week mark after starting Rinvoq to include lipid panel, CBC, c-Met.  At the 12-week mark he will reduce dose, I am recommending he continue 30 mg daily given he has severe disease and failure of multiple regimens.  He agrees with this and will place request to his insurance company.  He will continue Bentyl as needed.  I will see him again in 3 to 4 months for reassessment, he will call sooner with any issues.   PLAN: - continue Rinvoq 39m / day for total of 12 weeks - at 12 weeks, will reduce dose to 385m/ day (if covered by insurance, as opposed to lower dose of 1520m- check CBC, CMEt, lipid at 12 week mark of starting Rinvoq - should be due next month - continue Bentyl PRN - f/u 3-4 months  SteJolly MangoD [facility]

## 2022-06-16 NOTE — Telephone Encounter (Signed)
Called [facility] and spoke with [place]. Pt first filled Rinvoq 45 mg on 04/16/22. Last fill was 06/04/22. Patient will be due for labs on 07/09/22 (12-week mark). Pt will also decrease to Rinvoq 30 mg daily at that point. I called and spoke with patient to relay recommendations. Pt is aware that I will remind him about labs closer to that time and will also send new RX because we originally sent in 15 mg daily. Pt verbalized understanding of all information and had no concerns at the end of the call.   Lab orders and reminder in epic.  New RX for Rinvoq 30 mg daily sent to [facility].

## 2022-06-16 NOTE — Telephone Encounter (Signed)
-----   Message from Yetta Flock, MD sent at 06/16/2022  3:18 PM EDT ----- Regarding: question about Rinvoq for this patient Rosine Beat could use your assistance with Mr. Wellons: - can you tell when he started Rinvoq, I think in August? He will need CBC, CMET, lipids sometime in November (at 12 week mark of starting Rinvoq), if you can clarify when that is and let him know - when he reaches the 12 week mark, I'd like him to reduce dose to 66m / day. I think he was initially approved for 144m/ day but I would prefer to keep him on high dose if possible  Thank you!!

## 2022-06-16 NOTE — Patient Instructions (Addendum)
If you are age 61 or younger, your body mass index should be between 19-25. Your Body mass index is 18.13 kg/m. If this is out of the aformentioned range listed, please consider follow up with your Primary Care Provider.  ________________________________________________________  The [facility] providers would like to encourage you to use [facility] to communicate with providers for non-urgent requests or questions.  Due to long hold times on the telephone, sending your provider a message by [facility] may be a faster and more efficient way to get a response.  Please allow 48 business hours for a response.  Please remember that this is for non-urgent requests.  _______________________________________________________  Dennis Bast will need lab work at the 12 week mark after starting Rinvoq then reduce dose to 12m daily.   You will need a follow up in 3 to 4 months. We will contact you to schedule this appointment.  Thank you for entrusting me with your care and [facility]  Dr AHavery Moros

## 2022-06-26 ENCOUNTER — Other Ambulatory Visit (HOSPITAL_COMMUNITY): Payer: Self-pay

## 2022-06-29 ENCOUNTER — Ambulatory Visit: Payer: Medicare Other | Admitting: Internal Medicine

## 2022-07-01 DIAGNOSIS — Z933 Colostomy status: Secondary | ICD-10-CM | POA: Diagnosis not present

## 2022-07-03 ENCOUNTER — Other Ambulatory Visit: Payer: Self-pay | Admitting: Gastroenterology

## 2022-07-03 ENCOUNTER — Other Ambulatory Visit: Payer: Self-pay

## 2022-07-03 ENCOUNTER — Other Ambulatory Visit (HOSPITAL_COMMUNITY): Payer: Self-pay

## 2022-07-06 ENCOUNTER — Other Ambulatory Visit (HOSPITAL_COMMUNITY): Payer: Self-pay

## 2022-07-07 ENCOUNTER — Other Ambulatory Visit (HOSPITAL_COMMUNITY): Payer: Self-pay

## 2022-07-07 ENCOUNTER — Telehealth: Payer: Self-pay

## 2022-07-07 MED ORDER — RINVOQ 30 MG PO TB24
1.0000 | ORAL_TABLET | Freq: Every day | ORAL | 11 refills | Status: DC
  Filled 2022-07-07: qty 30, 30d supply, fill #0
  Filled 2022-07-29: qty 30, 30d supply, fill #1
  Filled 2022-08-25: qty 30, 30d supply, fill #2
  Filled 2022-09-17: qty 30, 30d supply, fill #3
  Filled 2022-10-14: qty 30, 30d supply, fill #4
  Filled 2022-11-10: qty 30, 30d supply, fill #5
  Filled 2022-12-14: qty 30, 30d supply, fill #6
  Filled 2023-01-12: qty 30, 30d supply, fill #7

## 2022-07-07 NOTE — Telephone Encounter (Signed)
RX for 30 mg tablet has been sent to pharmacy.

## 2022-07-07 NOTE — Telephone Encounter (Signed)
Patient Teacher, English as a foreign language completed.    PA Not Needed  Joneen Boers, [place] Patient Advocate Specialist [place] Patient Advocate Team Direct Number: [phone] Fax: [phone]

## 2022-07-08 ENCOUNTER — Other Ambulatory Visit (HOSPITAL_COMMUNITY): Payer: Self-pay

## 2022-07-08 ENCOUNTER — Telehealth: Payer: Self-pay

## 2022-07-08 NOTE — Telephone Encounter (Signed)
Patient reviewed MyChart message. Last read by [name]. "Ron" at  3:38 PM on 07/08/2022.

## 2022-07-08 NOTE — Telephone Encounter (Signed)
-----   Message from Yevette Edwards, RN sent at 06/16/2022  3:48 PM EDT ----- Regarding: Labs CBC, CMET, Lipids due on 07/09/22 - 12 week mark on Rinvoq Patient should transition to Rinvoq 30 mg daily as well

## 2022-07-08 NOTE — Telephone Encounter (Signed)
MyChart message sent to patient with lab reminder.

## 2022-07-09 ENCOUNTER — Other Ambulatory Visit (INDEPENDENT_AMBULATORY_CARE_PROVIDER_SITE_OTHER): Payer: Medicare Other

## 2022-07-09 DIAGNOSIS — K50018 Crohn's disease of small intestine with other complication: Secondary | ICD-10-CM

## 2022-07-09 DIAGNOSIS — Z79899 Other long term (current) drug therapy: Secondary | ICD-10-CM

## 2022-07-09 DIAGNOSIS — Z933 Colostomy status: Secondary | ICD-10-CM

## 2022-07-09 LAB — CBC WITH DIFFERENTIAL/PLATELET
Basophils Absolute: 0 10*3/uL (ref 0.0–0.1)
Basophils Relative: 0.4 % (ref 0.0–3.0)
Eosinophils Absolute: 0 10*3/uL (ref 0.0–0.7)
Eosinophils Relative: 0.5 % (ref 0.0–5.0)
HCT: 38.2 % — ABNORMAL LOW (ref 39.0–52.0)
Hemoglobin: 12.8 g/dL — ABNORMAL LOW (ref 13.0–17.0)
Lymphocytes Relative: 26.9 % (ref 12.0–46.0)
Lymphs Abs: 2.4 10*3/uL (ref 0.7–4.0)
MCHC: 33.4 g/dL (ref 30.0–36.0)
MCV: 99.8 fl (ref 78.0–100.0)
Monocytes Absolute: 0.7 10*3/uL (ref 0.1–1.0)
Monocytes Relative: 8 % (ref 3.0–12.0)
Neutro Abs: 5.6 10*3/uL (ref 1.4–7.7)
Neutrophils Relative %: 64.2 % (ref 43.0–77.0)
Platelets: 219 10*3/uL (ref 150.0–400.0)
RBC: 3.83 Mil/uL — ABNORMAL LOW (ref 4.22–5.81)
RDW: 14.9 % (ref 11.5–15.5)
WBC: 8.8 10*3/uL (ref 4.0–10.5)

## 2022-07-09 LAB — COMPREHENSIVE METABOLIC PANEL
ALT: 13 U/L (ref 0–53)
AST: 22 U/L (ref 0–37)
Albumin: 3.6 g/dL (ref 3.5–5.2)
Alkaline Phosphatase: 53 U/L (ref 39–117)
BUN: 11 mg/dL (ref 6–23)
CO2: 21 mEq/L (ref 19–32)
Calcium: 8.1 mg/dL — ABNORMAL LOW (ref 8.4–10.5)
Chloride: 110 mEq/L (ref 96–112)
Creatinine, Ser: 1.43 mg/dL (ref 0.40–1.50)
GFR: 52.99 mL/min — ABNORMAL LOW (ref >60.00)
Glucose, Bld: 98 mg/dL (ref 70–99)
Potassium: 3.5 mEq/L (ref 3.5–5.1)
Sodium: 137 mEq/L (ref 135–145)
Total Bilirubin: 1.9 mg/dL — ABNORMAL HIGH (ref 0.2–1.2)
Total Protein: 5.6 g/dL — ABNORMAL LOW (ref 6.0–8.3)

## 2022-07-09 LAB — LIPID PANEL
Cholesterol: 145 mg/dL (ref 0–200)
HDL: 76.5 mg/dL (ref >39.00)
LDL Cholesterol: 34 mg/dL (ref 0–99)
NonHDL: 68.57
Total CHOL/HDL Ratio: 2
Triglycerides: 175 mg/dL — ABNORMAL HIGH (ref 0.0–149.0)
VLDL: 35 mg/dL (ref 0.0–40.0)

## 2022-07-15 ENCOUNTER — Encounter (HOSPITAL_COMMUNITY): Payer: Self-pay | Admitting: Nurse Practitioner

## 2022-07-29 ENCOUNTER — Other Ambulatory Visit (HOSPITAL_COMMUNITY): Payer: Self-pay

## 2022-07-31 ENCOUNTER — Other Ambulatory Visit (HOSPITAL_COMMUNITY): Payer: Self-pay

## 2022-08-02 DIAGNOSIS — Z933 Colostomy status: Secondary | ICD-10-CM | POA: Diagnosis not present

## 2022-08-20 ENCOUNTER — Other Ambulatory Visit: Payer: Self-pay | Admitting: Gastroenterology

## 2022-08-20 NOTE — Telephone Encounter (Signed)
We can refill lomotil but I do not prescribe albuterol inhalers, he will need to get that from his PCP. Thanks

## 2022-08-25 ENCOUNTER — Other Ambulatory Visit (HOSPITAL_COMMUNITY): Payer: Self-pay

## 2022-08-25 ENCOUNTER — Other Ambulatory Visit: Payer: Self-pay

## 2022-08-25 ENCOUNTER — Ambulatory Visit (INDEPENDENT_AMBULATORY_CARE_PROVIDER_SITE_OTHER): Payer: Medicare Other

## 2022-08-25 ENCOUNTER — Ambulatory Visit: Payer: Medicare Other

## 2022-08-25 VITALS — Ht 71.0 in | Wt 130.0 lb

## 2022-08-25 DIAGNOSIS — Z0000 Encounter for general adult medical examination without abnormal findings: Secondary | ICD-10-CM

## 2022-08-25 NOTE — Patient Instructions (Addendum)
Gregory [name] , Thank you for taking time to come for your Medicare Wellness Visit. I appreciate your ongoing commitment to your health goals. Please review the following plan we discussed and let me know if I can assist you in the future.   These are the goals we discussed:  Goals       Patient Stated      Eating healthier that I can toloerate      Patient Stated      08/19/2021, trying to have a diet that he can tolerate      Stay Healthy (pt-stated)        This is a list of the screening recommended for you and due dates:  Health Maintenance  Topic Date Due   DTaP/Tdap/Td vaccine (1 - Tdap) Never done   COVID-19 Vaccine (4 - 2023-24 season) 09/10/2022*   Medicare Annual Wellness Visit  08/26/2023   Colon Cancer Screening  04/03/2032   Flu Shot  Completed   Hepatitis C Screening: USPSTF Recommendation to screen - Ages 18-79 yo.  Completed   HIV Screening  Completed   Zoster (Shingles) Vaccine  Completed   HPV Vaccine  Aged Out  *Topic was postponed. The date shown is not the original due date.  Opioid Pain Medicine Management Opioids are powerful medicines that are used to treat moderate to severe pain. When used for short periods of time, they can help you to: Sleep better. Do better in physical or occupational therapy. Feel better in the first few days after an injury. Recover from surgery. Opioids should be taken with the supervision of a trained health care provider. They should be taken for the shortest period of time possible. This is because opioids can be addictive, and the longer you take opioids, the greater your risk of addiction. This addiction can also be called opioid use disorder. What are the risks? Using opioid pain medicines for longer than 3 days increases your risk of side effects. Side effects include: Constipation. Nausea and vomiting. Breathing difficulties (respiratory depression). Drowsiness. Confusion. Opioid use disorder. Itching. Taking opioid  pain medicine for a long period of time can affect your ability to do daily tasks. It also puts you at risk for: Motor vehicle crashes. Depression. Suicide. Heart attack. Overdose, which can be life-threatening. What is a pain treatment plan? A pain treatment plan is an agreement between you and your health care provider. Pain is unique to each person, and treatments vary depending on your condition. To manage your pain, you and your health care provider need to work together. To help you do this: Discuss the goals of your treatment, including how much pain you might expect to have and how you will manage the pain. Review the risks and benefits of taking opioid medicines. Remember that a good treatment plan uses more than one approach and minimizes the chance of side effects. Be honest about the amount of medicines you take and about any drug or alcohol use. Get pain medicine prescriptions from only one health care provider. Pain can be managed with many types of alternative treatments. Ask your health care provider to refer you to one or more specialists who can help you manage pain through: Physical or occupational therapy. Counseling (cognitive behavioral therapy). Good nutrition. Biofeedback. Massage. Meditation. Non-opioid medicine. Following a gentle exercise program. How to use opioid pain medicine Taking medicine Take your pain medicine exactly as told by your health care provider. Take it only when you need it. If your  pain gets less severe, you may take less than your prescribed dose if your health care provider approves. If you are not having pain, do nottake pain medicine unless your health care provider tells you to take it. If your pain is severe, do nottry to treat it yourself by taking more pills than instructed on your prescription. Contact your health care provider for help. Write down the times when you take your pain medicine. It is easy to become confused while on  pain medicine. Writing the time can help you avoid overdose. Take other over-the-counter or prescription medicines only as told by your health care provider. Keeping yourself and others safe  While you are taking opioid pain medicine: Do not drive, use machinery, or power tools. Do not sign legal documents. Do not drink alcohol. Do not take sleeping pills. Do not supervise children by yourself. Do not do activities that require climbing or being in high places. Do not go to a lake, river, ocean, spa, or swimming pool. Do not share your pain medicine with anyone. Keep pain medicine in a locked cabinet or in a secure area where pets and children cannot reach it. Stopping your use of opioids If you have been taking opioid medicine for more than a few weeks, you may need to slowly decrease (taper) how much you take until you stop completely. Tapering your use of opioids can decrease your risk of symptoms of withdrawal, such as: Pain and cramping in the abdomen. Nausea. Sweating. Sleepiness. Restlessness. Uncontrollable shaking (tremors). Cravings for the medicine. Do not attempt to taper your use of opioids on your own. Talk with your health care provider about how to do this. Your health care provider may prescribe a step-down schedule based on how much medicine you are taking and how long you have been taking it. Getting rid of leftover pills Do not save any leftover pills. Get rid of leftover pills safely by: Taking the medicine to a prescription take-back program. This is usually offered by the county or law enforcement. Bringing them to a pharmacy that has a drug disposal container. Flushing them down the toilet. Check the label or package insert of your medicine to see whether this is safe to do. Throwing them out in the trash. Check the label or package insert of your medicine to see whether this is safe to do. If it is safe to throw it out, remove the medicine from the original  container, put it into a sealable bag or container, and mix it with used coffee grounds, food scraps, dirt, or cat litter before putting it in the trash. Follow these instructions at home: Activity Do exercises as told by your health care provider. Avoid activities that make your pain worse. Return to your normal activities as told by your health care provider. Ask your health care provider what activities are safe for you. General instructions You may need to take these actions to prevent or treat constipation: Drink enough fluid to keep your urine pale yellow. Take over-the-counter or prescription medicines. Eat foods that are high in fiber, such as beans, whole grains, and fresh fruits and vegetables. Limit foods that are high in fat and processed sugars, such as fried or sweet foods. Keep all follow-up visits. This is important. Where to find support If you have been taking opioids for a long time, you may benefit from receiving support for quitting from a local support group or counselor. Ask your health care provider for a referral to these  resources in your area. Where to find more information Centers for Disease Control and Prevention (CDC): http://www.wolf.info/ [place] Food and Drug Administration (FDA): [place] Get help right away if: You may have taken too much of an opioid (overdosed). Common symptoms of an overdose: Your breathing is slower or more shallow than normal. You have a very slow heartbeat (pulse). You have slurred speech. You have nausea and vomiting. Your pupils become very small. You have other potential symptoms: You are very confused. You faint or feel like you will faint. You have cold, clammy skin. You have blue lips or fingernails. You have thoughts of harming yourself or harming others. These symptoms may represent a serious problem that is an emergency. Do not wait to see if the symptoms will go away. Get medical help right away. Call your local emergency  services (911 in the [place] Do not drive yourself to the hospital.  If you ever feel like you may hurt yourself or others, or have thoughts about taking your own life, get help right away. Go to your nearest emergency department or: Call your local emergency services (911 in the [place] Call the [facility] 867-780-0593 in the [place] Call a suicide crisis helpline, such as the [facility] at 712-076-4241 or 988 in the [place]. This is open 24 hours a day in the [place] Text the Crisis Text Line at (847)342-7094 (in the [place].). Summary Opioid medicines can help you manage moderate to severe pain for a short period of time. A pain treatment plan is an agreement between you and your health care provider. Discuss the goals of your treatment, including how much pain you might expect to have and how you will manage the pain. If you think that you or someone else may have taken too much of an opioid, get medical help right away. This information is not intended to replace advice given to you by your health care provider. Make sure you discuss any questions you have with your health care provider. Document Revised: 03/12/2021 Document Reviewed: 11/27/2020 Elsevier Patient Education  [place] directives: Please bring a copy of your health care power of attorney and living will to the office to be added to your chart at your convenience.   Conditions/risks identified: None  Next appointment: Follow up in one year for your annual wellness visit    Preventive Care 40-64 Years, Male Preventive care refers to lifestyle choices and visits with your health care provider that can promote health and wellness. What does preventive care include? A yearly physical exam. This is also called an annual well check. Dental exams once or twice a year. Routine eye exams. Ask your health care provider how often you should have your eyes checked. Personal  lifestyle choices, including: Daily care of your teeth and gums. Regular physical activity. Eating a healthy diet. Avoiding tobacco and drug use. Limiting alcohol use. Practicing safe sex. Taking low-dose aspirin every day starting at age 54. What happens during an annual well check? The services and screenings done by your health care provider during your annual well check will depend on your age, overall health, lifestyle risk factors, and family history of disease. Counseling  Your health care provider may ask you questions about your: Alcohol use. Tobacco use. Drug use. Emotional well-being. Home and relationship well-being. Sexual activity. Eating habits. Work and work Statistician. Screening  You may have the following tests or measurements: Height, weight, and BMI. Blood pressure. Lipid  and cholesterol levels. These may be checked every 5 years, or more frequently if you are over 74 years old. Skin check. Lung cancer screening. You may have this screening every year starting at age 59 if you have a 30-pack-year history of smoking and currently smoke or have quit within the past 15 years. Fecal occult blood test (FOBT) of the stool. You may have this test every year starting at age 52. Flexible sigmoidoscopy or colonoscopy. You may have a sigmoidoscopy every 5 years or a colonoscopy every 10 years starting at age 9. Prostate cancer screening. Recommendations will vary depending on your family history and other risks. Hepatitis C blood test. Hepatitis B blood test. Sexually transmitted disease (STD) testing. Diabetes screening. This is done by checking your blood sugar (glucose) after you have not eaten for a while (fasting). You may have this done every 1-3 years. Discuss your test results, treatment options, and if necessary, the need for more tests with your health care provider. Vaccines  Your health care provider may recommend certain vaccines, such as: Influenza  vaccine. This is recommended every year. Tetanus, diphtheria, and acellular pertussis (Tdap, Td) vaccine. You may need a Td booster every 10 years. Zoster vaccine. You may need this after age 65. Pneumococcal 13-valent conjugate (PCV13) vaccine. You may need this if you have certain conditions and have not been vaccinated. Pneumococcal polysaccharide (PPSV23) vaccine. You may need one or two doses if you smoke cigarettes or if you have certain conditions. Talk to your health care provider about which screenings and vaccines you need and how often you need them. This information is not intended to replace advice given to you by your health care provider. Make sure you discuss any questions you have with your health care provider. Document Released: 09/13/2015 Document Revised: 05/06/2016 Document Reviewed: 06/18/2015 Elsevier Interactive Patient Education  2017 [place] Prevention in the Home Falls can cause injuries. They can happen to people of all ages. There are many things you can do to make your home safe and to help prevent falls. What can I do on the outside of my home? Regularly fix the edges of walkways and driveways and fix any cracks. Remove anything that might make you trip as you walk through a door, such as a raised step or threshold. Trim any bushes or trees on the path to your home. Use bright outdoor lighting. Clear any walking paths of anything that might make someone trip, such as rocks or tools. Regularly check to see if handrails are loose or broken. Make sure that both sides of any steps have handrails. Any raised decks and porches should have guardrails on the edges. Have any leaves, snow, or ice cleared regularly. Use sand or salt on walking paths during winter. Clean up any spills in your garage right away. This includes oil or grease spills. What can I do in the bathroom? Use night lights. Install grab bars by the toilet and in the tub and shower. Do not  use towel bars as grab bars. Use non-skid mats or decals in the tub or shower. If you need to sit down in the shower, use a plastic, non-slip stool. Keep the floor dry. Clean up any water that spills on the floor as soon as it happens. Remove soap buildup in the tub or shower regularly. Attach bath mats securely with double-sided non-slip rug tape. Do not have throw rugs and other things on the floor that can make you trip. What  can I do in the bedroom? Use night lights. Make sure that you have a light by your bed that is easy to reach. Do not use any sheets or blankets that are too big for your bed. They should not hang down onto the floor. Have a firm chair that has side arms. You can use this for support while you get dressed. Do not have throw rugs and other things on the floor that can make you trip. What can I do in the kitchen? Clean up any spills right away. Avoid walking on wet floors. Keep items that you use a lot in easy-to-reach places. If you need to reach something above you, use a strong step stool that has a grab bar. Keep electrical cords out of the way. Do not use floor polish or wax that makes floors slippery. If you must use wax, use non-skid floor wax. Do not have throw rugs and other things on the floor that can make you trip. What can I do with my stairs? Do not leave any items on the stairs. Make sure that there are handrails on both sides of the stairs and use them. Fix handrails that are broken or loose. Make sure that handrails are as long as the stairways. Check any carpeting to make sure that it is firmly attached to the stairs. Fix any carpet that is loose or worn. Avoid having throw rugs at the top or bottom of the stairs. If you do have throw rugs, attach them to the floor with carpet tape. Make sure that you have a light switch at the top of the stairs and the bottom of the stairs. If you do not have them, ask someone to add them for you. What else can I do  to help prevent falls? Wear shoes that: Do not have high heels. Have rubber bottoms. Are comfortable and fit you well. Are closed at the toe. Do not wear sandals. If you use a stepladder: Make sure that it is fully opened. Do not climb a closed stepladder. Make sure that both sides of the stepladder are locked into place. Ask someone to hold it for you, if possible. Clearly mark and make sure that you can see: Any grab bars or handrails. First and last steps. Where the edge of each step is. Use tools that help you move around (mobility aids) if they are needed. These include: Canes. Walkers. Scooters. Crutches. Turn on the lights when you go into a dark area. Replace any light bulbs as soon as they burn out. Set up your furniture so you have a clear path. Avoid moving your furniture around. If any of your floors are uneven, fix them. If there are any pets around you, be aware of where they are. Review your medicines with your doctor. Some medicines can make you feel dizzy. This can increase your chance of falling. Ask your doctor what other things that you can do to help prevent falls. This information is not intended to replace advice given to you by your health care provider. Make sure you discuss any questions you have with your health care provider. Document Released: 06/13/2009 Document Revised: 01/23/2016 Document Reviewed: 09/21/2014 Elsevier Interactive Patient Education  2017 [employer].

## 2022-08-25 NOTE — Progress Notes (Signed)
Subjective:   Gregory [name]. is a 61 y.o. male who presents for Medicare Annual/Subsequent preventive examination.  Review of Systems    Virtual Visit via Telephone Note  I connected with  Gregory [name]. on 08/25/22 at 12:30 PM EST by telephone and verified that I am speaking with the correct person using two identifiers.  Location: Patient: Home Provider: Office Persons participating in the virtual visit: patient/Nurse Health Advisor   I discussed the limitations, risks, security and privacy concerns of performing an evaluation and management service by telephone and the availability of in person appointments. The patient expressed understanding and agreed to proceed.  Interactive audio and video telecommunications were attempted between this nurse and patient, however failed, due to patient having technical difficulties OR patient did not have access to video capability.  We continued and completed visit with audio only.  Some vital signs may be absent or patient reported.   Gregory Peaches, LPN  Cardiac Risk Factors include: advanced age (>12mn, >>41women);male gender;Other (see comment), Risk factor comments: Dx Chron"s Disease     Objective:    Today's Vitals   08/25/22 1239  Weight: 130 lb (59 kg)  Height: 5' 11"  (1.803 m)   Body mass index is 18.13 kg/m.     08/25/2022   12:56 PM 08/19/2021    1:37 PM 05/22/2021    4:23 PM 08/13/2020   11:53 AM 12/19/2019    6:42 PM 12/19/2019    2:33 PM 01/17/2018    6:43 PM  Advanced Directives  Does Patient Have a Medical Advance Directive? Yes Yes No Yes Yes Yes Yes  Type of AParamed[place]of [place] will [place] will  Living will HE[place]Living will HPowersLiving will HBrawleyLiving will  Does patient want to make changes to medical advance directive?     No - Patient declined  No - Patient  declined  Copy of HMunds Parkin Chart? No - copy requested No - copy requested   No - copy requested  No - copy requested  Would patient like information on creating a medical advance directive?   No - Patient declined        Current Medications (verified) Outpatient Encounter Medications as of 08/25/2022  Medication Sig   acetaminophen (TYLENOL) 500 MG tablet Take 500 mg by mouth every 6 (six) hours as needed for moderate pain.   cyanocobalamin (,VITAMIN B-12,) 1000 MCG/ML injection Inject 1 mL (1,000 mcg total) into the muscle every 30 (thirty) days.   dicyclomine (BENTYL) 10 MG capsule Take 1 capsule (10 mg total) by mouth every 8 (eight) hours as needed (abdominal cramping).   diphenoxylate-atropine (LOMOTIL) 2.5-0.025 MG tablet TAKE 1 TABLET 4 TIMES A DAY AS NEEDED FOR DIARRHEA OR LOOSE STOOLS   metroNIDAZOLE (FLAGYL) 500 MG tablet Take 1 tablet (500 mg total) by mouth 3 (three) times daily.   Multiple Vitamin (MULTIVITAMIN WITH MINERALS) TABS tablet Take 1 tablet by mouth daily.   omeprazole (PRILOSEC) 20 MG capsule TAKE ONE CAPSULE ONCE DAILY   ondansetron (ZOFRAN) 4 MG tablet TAKE 1 TABLET EVERY 8 HOURS AS NEEDED FOR NASUEA OR VOMITING   potassium chloride (KLOR-CON) 20 MEQ packet Take 20 mEq by mouth daily.   sertraline (ZOLOFT) 50 MG tablet Take 1 tablet (50 mg total) by mouth daily.   traMADol (ULTRAM) 50 MG tablet TAKE ONE TABLET EVERY 8 HOURS AS NEEDED  FOR PAIN (Patient taking differently: Take 50 mg by mouth daily.)   Upadacitinib ER (RINVOQ) 30 MG TB24 Take 1 tablet by mouth daily.   Upadacitinib ER (RINVOQ) 45 MG TB24 Take 1 tablet by mouth daily. For 12 weeks - starter dose   Vitamin D, Ergocalciferol, 2000 units CAPS Take 4,000 Units by mouth daily.   No facility-administered encounter medications on file as of 08/25/2022.    Allergies (verified) Methotrexate derivatives, Phenergan [promethazine hcl], Humira [adalimumab], Penicillins, and Remicade  [infliximab]   History: Past Medical History:  Diagnosis Date   Acute endocarditis    Blood transfusion without reported diagnosis    COVID 05/2021   Crohn's disease ([place])    Depression    Eating disorder    GERD (gastroesophageal reflux disease)    Kidney stones    Osteoporosis    Short gut syndrome    Past Surgical History:  Procedure Laterality Date   COLONOSCOPY  06/11/2020   COLONOSCOPY  07/05/2018   COLONOSCOPY WITH PROPOFOL N/A 09/17/2017   Procedure: COLONOSCOPY WITH PROPOFOL;  Surgeon: [name], MD;  Location: [facility];  Service: Endoscopy;  Laterality: N/A;  Through ileostomy.   CYSTOSCOPY WITH STENT PLACEMENT Left 05/20/2017   Procedure: CYSTOSCOPY WITH STENT PLACEMENT;  Surgeon: [name], MD;  Location: [facility];  Service: Urology;  Laterality: Left;   ILEOSTOMY     IR NEPHROSTOMY EXCHANGE LEFT  04/12/2017   IR NEPHROSTOMY PLACEMENT LEFT  04/12/2017   IR NEPHROSTOMY PLACEMENT LEFT  04/29/2017   NEPHROLITHOTOMY Left 05/14/2017   Procedure: LEFT NEPHROLITHOTOMY PERCUTANEOUS WITH SURGEON ACCESS;  Surgeon: [name], MD;  Location: [facility];  Service: Urology;  Laterality: Left;   SMALL INTESTINE SURGERY     SUBTOTAL COLECTOMY     Family History  Problem Relation Age of Onset   Heart disease Mother    Diabetes Father    Heart disease Father    Heart attack Father    Cancer Sister    Heart disease Sister    Melanoma Sister    Crohn's disease Brother    Inflammatory bowel disease Brother    Diabetes Paternal Grandmother    Colon cancer Neg Hx    Rectal cancer Neg Hx    Stomach cancer Neg Hx    Esophageal cancer Neg Hx    Social History   Socioeconomic History   Marital status: Divorced    Spouse name: Not on file   Number of children: 2   Years of education: Not on file   Highest education level: Not on file  Occupational History   Occupation: Retired   Occupation: On disability  Tobacco Use   Smoking status: Former     Packs/day: 1.00    Years: 20.00    Total pack years: 20.00    Types: Cigarettes    Quit date: 08/31/2006    Years since quitting: 15.9   Smokeless tobacco: Never  Vaping Use   Vaping Use: Never used  Substance and Sexual Activity   Alcohol use: No   Drug use: No   Sexual activity: Not Currently  Other Topics Concern   Not on file  Social History Narrative   Not on file   Social Determinants of Health   Financial Resource Strain: Low Risk  (08/25/2022)   Overall Financial Resource Strain (CARDIA)    Difficulty of Paying Living Expenses: Not hard at all  Food Insecurity: No Food Insecurity (08/25/2022)   Hunger Vital Sign  Worried About Charity fundraiser in the Last Year: Never true    [place] in the Last Year: Never true  Transportation Needs: No Transportation Needs (08/25/2022)   PRAPARE - Hydrologist (Medical): No    Lack of Transportation (Non-Medical): No  Physical Activity: Insufficiently Active (08/25/2022)   Exercise Vital Sign    Days of Exercise per Week: 2 days    Minutes of Exercise per Session: 30 min  Stress: No Stress Concern Present (08/25/2022)   [place]    Feeling of Stress : Not at all  Social Connections: Moderately Integrated (08/25/2022)   Social Connection and Isolation Panel [NHANES]    Frequency of Communication with Friends and Family: More than three times a week    Frequency of Social Gatherings with Friends and Family: More than three times a week    Attends Religious Services: More than 4 times per year    Active Member of [employer] or Organizations: Yes    Attends Music therapist: More than 4 times per year    Marital Status: Divorced    Tobacco Counseling Counseling given: Not Answered   Clinical Intake:  Pre-visit preparation completed: No  Pain : No/denies pain     BMI - recorded: 18.13 Nutritional Status:  BMI <19  Underweight Nutritional Risks: None Diabetes: No  How often do you need to have someone help you when you read instructions, pamphlets, or other written materials from your doctor or pharmacy?: 1 - Never  Diabetic?  No  Interpreter Needed?: No  Information entered by :: Rolene Arbour LPN   Activities of Daily Living    08/25/2022   12:50 PM  In your present state of health, do you have any difficulty performing the following activities:  Hearing? 0  Vision? 0  Difficulty concentrating or making decisions? 0  Walking or climbing stairs? 0  Dressing or bathing? 0  Doing errands, shopping? 0  Preparing Food and eating ? N  Using the Toilet? N  In the past six months, have you accidently leaked urine? N  Do you have problems with loss of bowel control? Y  Comment Colostomy followed by Gastrologist  Managing your Medications? N  Managing your Finances? N  Housekeeping or managing your Housekeeping? N    Patient Care Team: Billie Ruddy, MD as PCP - General (Family Medicine)  Indicate any recent Medical Services you may have received from other than [facility] providers in the past year (date may be approximate).     Assessment:   This is a routine wellness examination for Greysyn.  Hearing/Vision screen Hearing Screening - Comments:: Denies hearing difficulties   Vision Screening - Comments:: Wears rx glasses - up to date with routine eye exams with  Patient deferred  Dietary issues and exercise activities discussed: Current Exercise Habits: Home exercise routine, Type of exercise: walking, Time (Minutes): 30, Frequency (Times/Week): 2, Weekly Exercise (Minutes/Week): 60, Intensity: Moderate, Exercise limited by: None identified   Goals Addressed               This Visit's Progress     Stay Healthy (pt-stated)         Depression Screen    08/25/2022   12:48 PM 08/19/2021    1:37 PM 08/11/2021   11:35 AM 08/13/2020   11:50 AM 05/11/2017    1:09 PM   PHQ 2/9 Scores  PHQ -  2 Score 0 0 0 0 0  PHQ- 9 Score   3      Fall Risk    08/25/2022   12:54 PM 08/19/2021    1:37 PM 08/11/2021   11:35 AM 08/13/2020   11:55 AM 05/11/2017    1:09 PM  Fall Risk   Falls in the past year? 1 0 0 0 No  Number falls in past yr: 0   0   Injury with Fall? 1   0   Comment left knee injury, followed by medical attention      Risk for fall due to : No Fall Risks Medication side effect  Impaired vision;Impaired balance/gait   Follow up Falls prevention discussed Falls evaluation completed;Education provided;Falls prevention discussed  Falls prevention discussed     FALL RISK PREVENTION PERTAINING TO THE HOME:  Any stairs in or around the home? Yes  If so, are there any without handrails? No  Home free of loose throw rugs in walkways, pet beds, electrical cords, etc? Yes  Adequate lighting in your home to reduce risk of falls? Yes   ASSISTIVE DEVICES UTILIZED TO PREVENT FALLS:  Life alert? No  Use of a cane, walker or w/c? Yes  Grab bars in the bathroom? Yes  Shower chair or bench in shower? Yes  Elevated toilet seat or a handicapped toilet? No   TIMED UP AND GO:  Was the test performed? No . Audio Visit   Cognitive Function:        08/25/2022   12:56 PM 08/19/2021    1:39 PM 08/13/2020   11:58 AM  6CIT Screen  What Year? 0 points 0 points 0 points  What month? 0 points 0 points 0 points  What time? 0 points 0 points   Count back from 20 0 points 0 points 0 points  Months in reverse 0 points 0 points 0 points  Repeat phrase 0 points 2 points 0 points  Total Score 0 points 2 points     Immunizations Immunization History  Administered Date(s) Administered   Influenza,inj,Quad PF,6+ Mos 05/11/2017, 05/03/2019, 05/28/2020, 07/10/2021   Influenza-Unspecified 06/01/2018, 05/26/2022   Moderna SARS-COV2 Booster Vaccination 12/10/2020   Moderna Sars-Covid-2 Vaccination 12/05/2019, 01/02/2020, 06/26/2020   Pneumococcal Conjugate-13  01/10/2018   Pneumococcal Polysaccharide-23 04/10/2019   Zoster Recombinat (Shingrix) 06/28/2019, 09/05/2019          Covid-19 vaccine status: Completed vaccines  Qualifies for Shingles Vaccine? Yes   Zostavax completed Yes   Shingrix Completed?: Yes  Screening Tests Health Maintenance  Topic Date Due   DTaP/Tdap/Td (1 - Tdap) Never done   COVID-19 Vaccine (4 - 2023-24 season) 09/10/2022 (Originally 05/01/2022)   Medicare Annual Wellness ([place])  08/26/2023   COLONOSCOPY (Pts 45-61yr Insurance coverage will need to be confirmed)  04/03/2032   INFLUENZA VACCINE  Completed   Hepatitis C Screening  Completed   HIV Screening  Completed   Zoster Vaccines- Shingrix  Completed   HPV VACCINES  Aged Out    Health Maintenance  Health Maintenance Due  Topic Date Due   DTaP/Tdap/Td (1 - Tdap) Never done    Colorectal cancer screening: Type of screening: Colonoscopy. Completed 04/03/22. Repeat every 10 years  Lung Cancer Screening: (Low Dose CT Chest recommended if Age 61-80years, 30 pack-year currently smoking OR have quit w/in 15years.) does not qualify.     Additional Screening:  Hepatitis C Screening: does qualify; Completed 09/01/15  Vision Screening: Recommended annual ophthalmology exams for early detection of  glaucoma and other disorders of the eye. Is the patient up to date with their annual eye exam?  No Who is the provider or what is the name of the office in which the patient attends annual eye exams? Deferred If pt is not established with a provider, would they like to be referred to a provider to establish care? No .   Dental Screening: Recommended annual dental exams for proper oral hygiene  Community Resource Referral / Chronic Care Management:  CRR required this visit?  No   CCM required this visit?  No      Plan:     I have personally reviewed and noted the following in the patient's chart:   Medical and social history Use of alcohol, tobacco or  illicit drugs  Current medications and supplements including opioid prescriptions. Patient is currently taking opioid prescriptions. Information provided to patient regarding non-opioid alternatives. Patient advised to discuss non-opioid treatment plan with their provider. Functional ability and status Nutritional status Physical activity Advanced directives List of other physicians Hospitalizations, surgeries, and ER visits in previous 12 months Vitals Screenings to include cognitive, depression, and falls Referrals and appointments  In addition, I have reviewed and discussed with patient certain preventive protocols, quality metrics, and best practice recommendations. A written personalized care plan for preventive services as well as general preventive health recommendations were provided to patient.     Gregory Peaches, LPN   [id number]   Nurse Notes: None

## 2022-08-26 ENCOUNTER — Other Ambulatory Visit (HOSPITAL_COMMUNITY): Payer: Self-pay

## 2022-08-26 ENCOUNTER — Other Ambulatory Visit: Payer: Self-pay

## 2022-08-27 ENCOUNTER — Other Ambulatory Visit: Payer: Self-pay

## 2022-09-17 ENCOUNTER — Other Ambulatory Visit (HOSPITAL_COMMUNITY): Payer: Self-pay

## 2022-09-21 ENCOUNTER — Other Ambulatory Visit (HOSPITAL_COMMUNITY): Payer: Self-pay

## 2022-09-23 DIAGNOSIS — Z933 Colostomy status: Secondary | ICD-10-CM | POA: Diagnosis not present

## 2022-09-25 DIAGNOSIS — Z933 Colostomy status: Secondary | ICD-10-CM | POA: Diagnosis not present

## 2022-10-14 ENCOUNTER — Other Ambulatory Visit (HOSPITAL_COMMUNITY): Payer: Self-pay

## 2022-10-16 ENCOUNTER — Other Ambulatory Visit: Payer: Self-pay

## 2022-10-19 DIAGNOSIS — Z933 Colostomy status: Secondary | ICD-10-CM | POA: Diagnosis not present

## 2022-11-10 ENCOUNTER — Other Ambulatory Visit (HOSPITAL_COMMUNITY): Payer: Self-pay

## 2022-11-16 ENCOUNTER — Other Ambulatory Visit: Payer: Self-pay

## 2022-11-27 ENCOUNTER — Other Ambulatory Visit: Payer: Self-pay | Admitting: Gastroenterology

## 2022-12-12 DIAGNOSIS — Z933 Colostomy status: Secondary | ICD-10-CM | POA: Diagnosis not present

## 2022-12-14 ENCOUNTER — Other Ambulatory Visit (HOSPITAL_COMMUNITY): Payer: Self-pay

## 2022-12-15 ENCOUNTER — Other Ambulatory Visit: Payer: Self-pay

## 2022-12-15 ENCOUNTER — Other Ambulatory Visit (HOSPITAL_COMMUNITY): Payer: Self-pay

## 2022-12-16 ENCOUNTER — Other Ambulatory Visit (HOSPITAL_COMMUNITY): Payer: Self-pay

## 2023-01-11 DIAGNOSIS — Z933 Colostomy status: Secondary | ICD-10-CM | POA: Diagnosis not present

## 2023-01-12 ENCOUNTER — Other Ambulatory Visit (HOSPITAL_COMMUNITY): Payer: Self-pay

## 2023-01-13 ENCOUNTER — Other Ambulatory Visit (HOSPITAL_COMMUNITY): Payer: Self-pay

## 2023-01-14 ENCOUNTER — Other Ambulatory Visit (HOSPITAL_COMMUNITY): Payer: Self-pay

## 2023-01-15 ENCOUNTER — Other Ambulatory Visit (HOSPITAL_COMMUNITY): Payer: Self-pay

## 2023-01-16 ENCOUNTER — Other Ambulatory Visit: Payer: Self-pay | Admitting: Gastroenterology

## 2023-01-17 ENCOUNTER — Other Ambulatory Visit (HOSPITAL_COMMUNITY): Payer: Self-pay

## 2023-01-17 ENCOUNTER — Telehealth: Payer: Self-pay | Admitting: Pharmacy Technician

## 2023-01-17 NOTE — Telephone Encounter (Signed)
Patient Advocate Encounter  Received notification from [facility] that prior authorization for [facility] 15MG  is required.   PA submitted on 5.19.24 [facility] Status is pending

## 2023-01-18 ENCOUNTER — Other Ambulatory Visit (HOSPITAL_COMMUNITY): Payer: Self-pay

## 2023-01-18 NOTE — Telephone Encounter (Signed)
Yes okay to refill  

## 2023-01-18 NOTE — Telephone Encounter (Signed)
If approved can you send in as it is a controlled rx. Thank you Sir.

## 2023-01-19 ENCOUNTER — Other Ambulatory Visit: Payer: Self-pay

## 2023-01-19 ENCOUNTER — Other Ambulatory Visit (HOSPITAL_COMMUNITY): Payer: Self-pay

## 2023-01-19 ENCOUNTER — Telehealth: Payer: Self-pay | Admitting: Gastroenterology

## 2023-01-19 MED ORDER — DIPHENOXYLATE-ATROPINE 2.5-0.025 MG PO TABS: ORAL_TABLET | ORAL | 2 refills | Status: DC

## 2023-01-19 MED ORDER — RINVOQ 30 MG PO TB24
1.0000 | ORAL_TABLET | Freq: Every day | ORAL | 3 refills | Status: DC
  Filled 2023-01-19 (×2): qty 30, 30d supply, fill #0
  Filled 2023-02-09: qty 30, 30d supply, fill #1
  Filled 2023-03-10: qty 30, 30d supply, fill #2
  Filled 2023-04-05: qty 30, 30d supply, fill #3
  Filled 2023-04-29: qty 30, 30d supply, fill #4
  Filled 2023-06-04: qty 30, 30d supply, fill #5
  Filled 2023-07-02: qty 30, 30d supply, fill #6
  Filled 2023-08-03: qty 30, 30d supply, fill #7
  Filled 2023-08-31: qty 30, 30d supply, fill #8
  Filled 2023-09-28: qty 30, 30d supply, fill #9
  Filled 2023-10-28: qty 30, 30d supply, fill #10
  Filled 2023-11-29 (×2): qty 30, 30d supply, fill #11

## 2023-01-19 NOTE — Telephone Encounter (Signed)
Patient called regarding updates on diphenoxylate and Rinvoq medication. States he is almost out of both and he is unab;e to pick up medications from pharmacy. Please advise, thank you.

## 2023-01-19 NOTE — Telephone Encounter (Signed)
[facility] called on patient behalf stating the patient is eligible for a 90 day supply of Rinvoq medication. Requesting a prior auth for a 90 day supply be sent for the next refill in June. Please advise, thank you.

## 2023-01-19 NOTE — Telephone Encounter (Signed)
PA has been APPROVED from 01/17/2023-08/31/2023

## 2023-01-19 NOTE — Telephone Encounter (Addendum)
Attempted to reach patient. His line rings then goes to fast busy signal, no option to leave a vm at this time. Will attempt to reach patient again at a different time.  Lomotil was refilled today - see alternate telephone encounter.   PA for Rinvoq 30 mg completed and approved today - [facility] - PA has been APPROVED from 01/17/2023-08/31/2023. Checking with PA team to see if patient can continue to fill at [facility] or if refill needs to be sent to [facility].  Per PA team no pharmacy restrictions - 90 day supply of Rinvoq 30 mg has been sent to [facility]

## 2023-01-19 NOTE — Telephone Encounter (Signed)
As far as I know there is no restrictions on where the patient can fill!

## 2023-01-19 NOTE — Telephone Encounter (Signed)
Ok to fill per [facility]. Faxed to pharmacy

## 2023-01-19 NOTE — Addendum Note (Signed)
Addended by: Cooper Render on: 01/19/2023 10:29 AM   Modules accepted: Orders

## 2023-01-19 NOTE — Telephone Encounter (Signed)
Gregory [name], is patient able to fill this medication at [facility] outpatient pharmacy as he was previously doing or does the new prescription need to be sent to [facility]? Please advise, thanks!

## 2023-01-20 ENCOUNTER — Other Ambulatory Visit: Payer: Self-pay

## 2023-01-20 ENCOUNTER — Other Ambulatory Visit (HOSPITAL_COMMUNITY): Payer: Self-pay

## 2023-01-21 ENCOUNTER — Other Ambulatory Visit (HOSPITAL_COMMUNITY): Payer: Self-pay

## 2023-01-21 NOTE — Telephone Encounter (Signed)
Patient filled and picked up Rinvoq RX on 01/20/23.

## 2023-02-09 ENCOUNTER — Other Ambulatory Visit (HOSPITAL_COMMUNITY): Payer: Self-pay

## 2023-02-10 DIAGNOSIS — Z933 Colostomy status: Secondary | ICD-10-CM | POA: Diagnosis not present

## 2023-02-15 ENCOUNTER — Other Ambulatory Visit (HOSPITAL_COMMUNITY): Payer: Self-pay

## 2023-02-19 ENCOUNTER — Encounter: Payer: Self-pay | Admitting: Gastroenterology

## 2023-02-19 ENCOUNTER — Other Ambulatory Visit (INDEPENDENT_AMBULATORY_CARE_PROVIDER_SITE_OTHER): Payer: 59

## 2023-02-19 ENCOUNTER — Ambulatory Visit (INDEPENDENT_AMBULATORY_CARE_PROVIDER_SITE_OTHER)
Admission: RE | Admit: 2023-02-19 | Discharge: 2023-02-19 | Disposition: A | Discharge status: Home / Self Care | Payer: 59 | Source: Ambulatory Visit | Attending: Gastroenterology | Admitting: Gastroenterology

## 2023-02-19 ENCOUNTER — Ambulatory Visit (INDEPENDENT_AMBULATORY_CARE_PROVIDER_SITE_OTHER): Payer: 59 | Admitting: Gastroenterology

## 2023-02-19 ENCOUNTER — Other Ambulatory Visit: Payer: 59

## 2023-02-19 VITALS — BP 100/60 | HR 46 | Ht 71.0 in | Wt 124.0 lb

## 2023-02-19 DIAGNOSIS — M810 Age-related osteoporosis without current pathological fracture: Secondary | ICD-10-CM

## 2023-02-19 DIAGNOSIS — Z79899 Other long term (current) drug therapy: Secondary | ICD-10-CM

## 2023-02-19 DIAGNOSIS — Z933 Colostomy status: Secondary | ICD-10-CM

## 2023-02-19 DIAGNOSIS — K50819 Crohn's disease of both small and large intestine with unspecified complications: Secondary | ICD-10-CM

## 2023-02-19 LAB — CBC WITH DIFFERENTIAL/PLATELET
Basophils Absolute: 0 10*3/uL (ref 0.0–0.1)
Basophils Relative: 0.6 % (ref 0.0–3.0)
Eosinophils Absolute: 0.1 10*3/uL (ref 0.0–0.7)
Eosinophils Relative: 1.1 % (ref 0.0–5.0)
HCT: 34.9 % — ABNORMAL LOW (ref 39.0–52.0)
Hemoglobin: 11.4 g/dL — ABNORMAL LOW (ref 13.0–17.0)
Lymphocytes Relative: 22.7 % (ref 12.0–46.0)
Lymphs Abs: 1.6 10*3/uL (ref 0.7–4.0)
MCHC: 32.8 g/dL (ref 30.0–36.0)
MCV: 115.7 fl — ABNORMAL HIGH (ref 78.0–100.0)
Monocytes Absolute: 0.8 10*3/uL (ref 0.1–1.0)
Monocytes Relative: 12.1 % — ABNORMAL HIGH (ref 3.0–12.0)
Neutro Abs: 4.3 10*3/uL (ref 1.4–7.7)
Neutrophils Relative %: 63.5 % (ref 43.0–77.0)
Platelets: 296 10*3/uL (ref 150.0–400.0)
RBC: 3.02 Mil/uL — ABNORMAL LOW (ref 4.22–5.81)
RDW: 17.8 % — ABNORMAL HIGH (ref 11.5–15.5)
WBC: 6.8 10*3/uL (ref 4.0–10.5)

## 2023-02-19 LAB — COMPREHENSIVE METABOLIC PANEL
ALT: 10 U/L (ref 0–53)
AST: 15 U/L (ref 0–37)
Albumin: 2.9 g/dL — ABNORMAL LOW (ref 3.5–5.2)
Alkaline Phosphatase: 61 U/L (ref 39–117)
BUN: 9 mg/dL (ref 6–23)
CO2: 22 mEq/L (ref 19–32)
Calcium: 7.7 mg/dL — ABNORMAL LOW (ref 8.4–10.5)
Chloride: 111 mEq/L (ref 96–112)
Creatinine, Ser: 1.4 mg/dL (ref 0.40–1.50)
GFR: 54.12 mL/min — ABNORMAL LOW (ref >60.00)
Glucose, Bld: 93 mg/dL (ref 70–99)
Potassium: 3.5 mEq/L (ref 3.5–5.1)
Sodium: 139 mEq/L (ref 135–145)
Total Bilirubin: 0.7 mg/dL (ref 0.2–1.2)
Total Protein: 5.6 g/dL — ABNORMAL LOW (ref 6.0–8.3)

## 2023-02-19 LAB — VITAMIN D 25 HYDROXY (VIT D DEFICIENCY, FRACTURES): VITD: 16.62 ng/mL — ABNORMAL LOW (ref 30.00–100.00)

## 2023-02-19 NOTE — Progress Notes (Signed)
HPI :  Crohn's history Diagnosed with Crohn's disease in 6s. 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 He has a colostomy with a remnant rectal pouch. He reports he has had a bowel surgery in the 1982, with a "few more surgeries" in years after while in [place], as well as in [facility], they tried reversing his colostomy which did not work well for him, too much diarrhea and it was then reversed back. He has a history of abscess associated with small bowel.    He has been hospitalized multiple times. He previously had been on chronic prednisone for 10 years with fluctuating levels. He was on Cimzia for about 4-5 years in the past.  He has been on sulfasalazine for a long time. He reports an allergy to - he does not know the details of the reaction. He had an allergic reaction - hives, to both Remicade and Humira. Allergy to methotrexate. Failed therapeutic Entyvio, then on Stelara since March 2019, with dosing increase to once monthly. Initially did well with Stelara but ultimately failed. Colonoscopy 2023 showed ongoing active disease despite high dose Stelara, changed to Rinvoq August 2023.      SINCE LAST VISIT   62 year old male here for follow-up visit for his Crohn's disease.  Last seen in October 2023.  He has been doing really well since have last seen him for the most part.  Recall we transitioned him to Rinvoq August 2023 when he failed high-dose Stelara with active inflammation and ongoing symptoms.  He completed induction dosing and now on 30 mg/day maintenance dosing.  He states Rinvoq is "the best drug and never been on".  He states this is the best he has felt since he has been in his 6s.  He is not having any abdominal pains, the Rinvoq has resolved that.  He denies any blood in his stools.  He has baseline chronic diarrhea since his surgery, has about 6 bowel movements per day.  He tolerates the medication well.  In  April he states he had a sick contact and developed "stomach flu" and had severe diarrhea for some time but that has since passed and has done really well since then.  He feels that his Crohn's is under good control.  He is taking B12 and vitamin D supplementation.  He is up-to-date with the Shingrix vaccine.  He is not sure if he had the flu shot this past year.  He states his weight has been stable and tends to fluctuate.  He states his appetites been much better and he is eating well.  He finds that if he cooks his own meals at home he tolerates his food much better and his stomach does not bother him.  Abdominal pain has been the main symptom that bothers him from his Crohn's and he states this is the best it has been.  Recall he does have a history of osteoporosis based on DEXA scan over 2 years ago.  He is due for follow-up DEXA scan.  His last vitamin D level showed adequate supplementation, but has been sometime since he had it checked.  He is not using any tobacco.  TB testing up-to-date.  Otherwise feels well without complaints today.  Of note he ran out of his vitamin B12 supplementation.     Prior evaluation: EGD 03/21/2009 - normal   Colonoscopy 06/11/20 - Localized inflammation, graded as Rutgeerts Score i2 and characterized by shallow ulcerations  was found at the surgical anastomosis. In the distal 10cm of ileum there were scattered small ulcerations with normal intervening mucosa. The small bowel was deeply intubated and mucosa proximal to the distal 10cm of ileum was entirely normal. Scattered medium-mouthed diverticula were found in the entire colon. The exam was otherwise without abnormality. No colonic inflammation. No polyps.   IBD Health Care Maintenance: Annual Flu Vaccine - 2023 UTD Pneumococcal Vaccine if receiving immunosuppression: - PCV 13 01/10/18, PPSV23 04/10/19 Zoster vaccine if over age 54: 06/28/19 TB testing if on anti-TNF, yearly -07/2022 Vitamin D screening -  level > 30 07/09/21 Last Colonoscopy - 10/23 COVID 19 vaccine UTD 2023   CT 12/19/19 - IMPRESSION: 1. Progressive wall thickening involving a loop of ileum in the left upper and mid pelvis, distal to an anastomosis in the left mid abdomen. This is compatible mildly progressive thickening associated with the patient's known Crohn's disease. A neoplastic process can not be excluded but is less likely. 2. 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 3. Pronounced bullous changes in the inferior left upper lobe.   Colonoscopy 04/03/22: - Diffuse inflammation, graded as Rutgeerts Score i3 (diffuse aphthous ileitis) was found in the terminal ileum and at the surgical anastomosis. - There was evidence of a prior end-to-end ileo-colonic anastomosis in the ascending colon. This was patent and was characterized by inflammation. Inflammation slightly worse than the last exam. - A few small-mouthed diverticula were found in the entire colon. - The exam was otherwise without abnormality. The ostomy was normal. Prep adequate but some residual seeds / nuts which made it difficult to view a small area of the colon in the hepatic flexure     CT scan abdomen / pelvis with contrast 05/06/22: IMPRESSION: 1. Status post right hemicolectomy and ileocolic anastomosis, in addition to sigmoid colon resection and left lower quadrant end colostomy. 2. In comparison to prior examination dated 12/19/2019, very similar appearance of a loop of thickened, narrowed terminal ileum in the midline ventral abdomen measuring approximately 15 cm in length , which is however without evidence of obstruction or overt stricture at this time. Enteric contrast has transited to the left lower quadrant ostomy. Findings are consistent with Crohn's disease and related postoperative findings, with evidence of active inflammation in the terminal ileum.   Past Medical History:  Diagnosis Date   Acute endocarditis     Blood transfusion without reported diagnosis    COVID 05/2021   Crohn's disease (HCC)    Depression    Eating disorder    GERD (gastroesophageal reflux disease)    Kidney stones    Osteoporosis    Short gut syndrome      Past Surgical History:  Procedure Laterality Date   COLONOSCOPY  06/11/2020   COLONOSCOPY  07/05/2018   COLONOSCOPY WITH PROPOFOL N/A 09/17/2017   Procedure: COLONOSCOPY WITH PROPOFOL;  Surgeon: Napoleon Form, MD;  Location: [facility];  Service: Endoscopy;  Laterality: N/A;  Through ileostomy.   CYSTOSCOPY WITH STENT PLACEMENT Left 05/20/2017   Procedure: CYSTOSCOPY WITH STENT PLACEMENT;  Surgeon: Crist Fat, MD;  Location: [facility];  Service: Urology;  Laterality: Left;   ILEOSTOMY     IR NEPHROSTOMY EXCHANGE LEFT  04/12/2017   IR NEPHROSTOMY PLACEMENT LEFT  04/12/2017   IR NEPHROSTOMY PLACEMENT LEFT  04/29/2017   NEPHROLITHOTOMY Left 05/14/2017   Procedure: LEFT NEPHROLITHOTOMY PERCUTANEOUS WITH SURGEON ACCESS;  Surgeon: Crist Fat, MD;  Location: [facility];  Service: Urology;  Laterality: Left;   SMALL INTESTINE SURGERY     SUBTOTAL COLECTOMY     Family History  Problem Relation Age of Onset   Heart disease Mother    Diabetes Father    Heart disease Father    Heart attack Father    Cancer Sister    Heart disease Sister    Melanoma Sister    Anemia Sister    Crohn's disease Brother    Inflammatory bowel disease Brother    Diabetes Paternal Grandmother    Colon cancer Neg Hx    Rectal cancer Neg Hx    Stomach cancer Neg Hx    Esophageal cancer Neg Hx    Social History   Tobacco Use   Smoking status: Former    Packs/day: 1.00    Years: 20.00    Additional pack years: 0.00    Total pack years: 20.00    Types: Cigarettes    Quit date: 08/31/2006    Years since quitting: 16.4   Smokeless tobacco: Never  Vaping Use   Vaping Use: Never used  Substance Use Topics   Alcohol use: Yes    Comment: occ   Drug use: No   Current  Outpatient Medications  Medication Sig Dispense Refill   acetaminophen (TYLENOL) 500 MG tablet Take 500 mg by mouth every 6 (six) hours as needed for moderate pain.     dicyclomine (BENTYL) 10 MG capsule Take 1 capsule (10 mg total) by mouth every 8 (eight) hours as needed (abdominal cramping). 30 capsule 1   diphenoxylate-atropine (LOMOTIL) 2.5-0.025 MG tablet TAKE 1 TABLET 4 TIMES A DAY AS NEEDED FOR DIARRHEA OR LOOSE STOOLS. Please keep your June 21 appointment for any further refills. Thank you 120 tablet 2   metroNIDAZOLE (FLAGYL) 500 MG tablet Take 1 tablet (500 mg total) by mouth 3 (three) times daily. 21 tablet 0   Multiple Vitamin (MULTIVITAMIN WITH MINERALS) TABS tablet Take 1 tablet by mouth daily. 30 tablet 3   omeprazole (PRILOSEC) 20 MG capsule TAKE ONE CAPSULE ONCE DAILY 90 capsule 1   ondansetron (ZOFRAN) 4 MG tablet TAKE 1 TABLET EVERY 8 HOURS AS NEEDED FOR NASUEA OR VOMITING 30 tablet 1   potassium chloride (KLOR-CON) 20 MEQ packet Take 20 mEq by mouth daily. 90 each 1   sertraline (ZOLOFT) 50 MG tablet Take 1 tablet (50 mg total) by mouth daily. 90 tablet 1   traMADol (ULTRAM) 50 MG tablet TAKE ONE TABLET EVERY 8 HOURS AS NEEDED FOR PAIN (Patient taking differently: Take 50 mg by mouth daily.) 90 tablet 0   Upadacitinib ER (RINVOQ) 30 MG TB24 Take 1 tablet by mouth daily. 90 tablet 3   Vitamin D, Ergocalciferol, 2000 units CAPS Take 4,000 Units by mouth daily.     cyanocobalamin (,VITAMIN B-12,) 1000 MCG/ML injection Inject 1 mL (1,000 mcg total) into the muscle every 30 (thirty) days. (Patient not taking: Reported on 02/19/2023) 3 mL 1   No current facility-administered medications for this visit.   Allergies  Allergen Reactions   Methotrexate Derivatives Other (See Comments)    Anemia, low WBC, severe GI symptoms,    Phenergan [Promethazine Hcl] Other (See Comments)    twitching   Humira [Adalimumab] Other (See Comments)    Intolerance   Penicillins Other (See Comments)     REACTION DURING SURGERY Has patient had a PCN reaction causing immediate rash, facial/tongue/throat swelling, SOB or lightheadedness with hypotension: Unknown Has patient had a PCN reaction causing severe rash involving  mucus membranes or skin necrosis: Unknown Has patient had a PCN reaction that required hospitalization: Unknown Has patient had a PCN reaction occurring within the last 10 years: No If all of the above answers are "NO", then may proceed with Cephalosporin use.    Remicade [Infliximab] Other (See Comments)    Intolerance     Review of Systems: All systems reviewed and negative except where noted in HPI.   Lab Results  Component Value Date   WBC 8.8 07/09/2022   HGB 12.8 (L) 07/09/2022   HCT 38.2 (L) 07/09/2022   MCV 99.8 07/09/2022   PLT 219.0 07/09/2022    Lab Results  Component Value Date   CREATININE 1.43 07/09/2022   BUN 11 07/09/2022   NA 137 07/09/2022   K 3.5 07/09/2022   CL 110 07/09/2022   CO2 21 07/09/2022    Lab Results  Component Value Date   ALT 13 07/09/2022   AST 22 07/09/2022   ALKPHOS 53 07/09/2022   BILITOT 1.9 (H) 07/09/2022     Physical Exam: BP 100/60   Pulse (!) 46   Ht 5\' 11"  (1.803 m)   Wt 124 lb (56.2 kg)   BMI 17.29 kg/m  Constitutional: Pleasant,well-developed, male in no acute distress. HEENT: Normocephalic and atraumatic. Conjunctivae are normal. No scleral icterus. Neck supple.  Cardiovascular: Normal rate, regular rhythm.  Pulmonary/chest: Effort normal and breath sounds normal. No wheezing, rales or rhonchi. Abdominal: Soft, nondistended, nontender. Bowel sounds active throughout. There are no masses palpable. No hepatomegaly. Extremities: no edema Lymphadenopathy: No cervical adenopathy noted. Neurological: Alert and oriented to person place and time. Skin: Skin is warm and dry. No rashes noted. Psychiatric: Normal mood and affect. Behavior is normal.   ASSESSMENT: 62 y.o. male here for assessment of  the following  1. Crohn's disease of both small and lg int w unsp comp (HCC)   2. Colostomy in place [facility])   3. High risk medication use   4. Osteoporosis, unspecified osteoporosis type, unspecified pathological fracture presence    Difficult to control aggressive/complicated Crohn's disease.  Has failed multiple regimens.  Switch to Rinvoq this past August and has been doing really well on the regimen.  He states this has controlled his abdominal pain better than anything else has been on.  He has baseline diarrhea after surgery but states he is really happy with the regimen so far.  Lipid panel looked okay last November.  Recommend he go to the lab today for baseline CBC and CMET to make sure stable.  We discussed how to survey his Crohn's disease in regards to inflammatory state, if you want to do fecal calprotectin, imaging, or colonoscopy.  Will start with fecal calprotectin today, he is agreeable to that.    I discussed the risks of the regimen with him and he understands.  His Shingrix vaccine is up-to-date.  Recommend flu shot this fall.  He states he gets sick easily from sick contacts and tends to wear masks when he is out in public.  He will continue to cook his own food as he tolerates that much better.  Otherwise continue vitamin D supplementation.  He does have a history of osteoporosis and needs a follow-up DEXA scan to reassess this.  I will order that for him today.  Otherwise see him in 6 months for reassessment or sooner with issues   PLAN: - continue Rinvoq 30mg  / day - lab today - CBC, CMET, vitamin D - lab for fecal  calprotectin - DEXA scan to reassess bone health - refill vitamin B12 and continue that - continue vitamin D supplementation - flu shot this fall - Shingrix UTD - f/u 6 months or sooner with issues  Harlin Rain, MD [facility]

## 2023-02-19 NOTE — Patient Instructions (Addendum)
If your blood pressure at your visit was 140/90 or greater, please contact your primary care physician to follow up on this. ______________________________________________________  If you are age 62 or older, your body mass index should be between 23-30. Your Body mass index is 17.29 kg/m. If this is out of the aforementioned range listed, please consider follow up with your Primary Care Provider.  If you are age 62 or younger, your body mass index should be between 19-25. Your Body mass index is 17.29 kg/m. If this is out of the aformentioned range listed, please consider follow up with your Primary Care Provider.  ________________________________________________________  The [facility] providers would like to encourage you to use [facility] to communicate with providers for non-urgent requests or questions.  Due to long hold times on the telephone, sending your provider a message by [facility] may be a faster and more efficient way to get a response.  Please allow 48 business hours for a response.  Please remember that this is for non-urgent requests.  _______________________________________________________  Due to recent changes in healthcare laws, you may see the results of your imaging and laboratory studies on MyChart before your provider has had a chance to review them.  We understand that in some cases there may be results that are confusing or concerning to you. Not all laboratory results come back in the same time frame and the provider may be waiting for multiple results in order to interpret others.  Please give [place] 48 hours in order for your provider to thoroughly review all the results before contacting the office for clarification of your results.   Please go to the lab in the basement of our building to have lab work done as you leave today. Hit "B" for basement when you get on the elevator.  When the doors open the lab is on your left.  We will call you with the results. Thank you.  Please  go to Radiology in the basement of our building for a DexaScan (Bone Density) today. If you would rather schedule an appointment please call [phone].  Continue RINVOQ.  Plan to get a flu shot this fall.  Follow up in 6 months.  Thank you for entrusting me with your care and for choosing [facility], Dr. Ileene Patrick

## 2023-02-23 ENCOUNTER — Telehealth: Payer: Self-pay | Admitting: Gastroenterology

## 2023-02-23 ENCOUNTER — Other Ambulatory Visit: Payer: Self-pay

## 2023-02-23 DIAGNOSIS — K50819 Crohn's disease of both small and large intestine with unspecified complications: Secondary | ICD-10-CM

## 2023-02-23 DIAGNOSIS — M810 Age-related osteoporosis without current pathological fracture: Secondary | ICD-10-CM

## 2023-02-23 DIAGNOSIS — Z79899 Other long term (current) drug therapy: Secondary | ICD-10-CM

## 2023-02-23 NOTE — Telephone Encounter (Signed)
Patient returned your call.

## 2023-02-24 ENCOUNTER — Other Ambulatory Visit: Payer: Self-pay

## 2023-02-24 MED ORDER — CYANOCOBALAMIN 1000 MCG/ML IJ SOLN: 1000.0000 ug | INTRAMUSCULAR | 1 refills | Status: DC

## 2023-02-24 NOTE — Telephone Encounter (Signed)
See lab and Bone Density results from 6-21

## 2023-02-25 LAB — CALPROTECTIN, FECAL: Calprotectin, Fecal: 615 ug/g — ABNORMAL HIGH (ref 0–120)

## 2023-03-01 ENCOUNTER — Encounter: Payer: Self-pay | Admitting: Gastroenterology

## 2023-03-02 ENCOUNTER — Other Ambulatory Visit: Payer: Self-pay

## 2023-03-02 DIAGNOSIS — Z79899 Other long term (current) drug therapy: Secondary | ICD-10-CM

## 2023-03-02 DIAGNOSIS — K50819 Crohn's disease of both small and large intestine with unspecified complications: Secondary | ICD-10-CM

## 2023-03-02 NOTE — Progress Notes (Signed)
Scheduled MR Abd and Pelvis Enterography at [facility]  on [address], 7-10. To arrive at 9:30am.  NPO after midnight. MyChart message to pt

## 2023-03-02 NOTE — Telephone Encounter (Signed)
Dr Adela Lank- Are you wanting MR entero abdomen and pelvis w/wo contrast or just MR abdomen?

## 2023-03-03 NOTE — Telephone Encounter (Signed)
Pt is scheduled for MR entero abdomen/pelvis

## 2023-03-09 ENCOUNTER — Encounter: Payer: Self-pay | Admitting: Gastroenterology

## 2023-03-09 ENCOUNTER — Telehealth: Payer: Self-pay | Admitting: Gastroenterology

## 2023-03-09 NOTE — Telephone Encounter (Signed)
Inbound call from patient stating his [employer] has not been filed. States he has been getting billed but it has not went through [employer] and his visit should have been scheduled for some time now. Requesting a call back to discuss further. Please advise, thank you.

## 2023-03-10 ENCOUNTER — Ambulatory Visit: Payer: 59

## 2023-03-10 ENCOUNTER — Other Ambulatory Visit (HOSPITAL_COMMUNITY): Payer: Self-pay

## 2023-03-10 ENCOUNTER — Other Ambulatory Visit: Payer: Self-pay

## 2023-03-12 ENCOUNTER — Other Ambulatory Visit: Payer: Self-pay

## 2023-03-12 DIAGNOSIS — Z933 Colostomy status: Secondary | ICD-10-CM | POA: Diagnosis not present

## 2023-03-29 ENCOUNTER — Other Ambulatory Visit: Payer: Self-pay | Admitting: Family Medicine

## 2023-03-29 DIAGNOSIS — N529 Male erectile dysfunction, unspecified: Secondary | ICD-10-CM

## 2023-04-05 ENCOUNTER — Other Ambulatory Visit (HOSPITAL_COMMUNITY): Payer: Self-pay

## 2023-04-09 ENCOUNTER — Other Ambulatory Visit (HOSPITAL_COMMUNITY): Payer: Self-pay

## 2023-04-11 DIAGNOSIS — Z933 Colostomy status: Secondary | ICD-10-CM | POA: Diagnosis not present

## 2023-04-29 ENCOUNTER — Other Ambulatory Visit (HOSPITAL_COMMUNITY): Payer: Self-pay

## 2023-05-04 ENCOUNTER — Other Ambulatory Visit: Payer: Self-pay

## 2023-05-04 ENCOUNTER — Other Ambulatory Visit: Payer: Self-pay | Admitting: Gastroenterology

## 2023-05-04 MED ORDER — DIPHENOXYLATE-ATROPINE 2.5-0.025 MG PO TABS: ORAL_TABLET | ORAL | 3 refills | Status: DC

## 2023-05-04 NOTE — Telephone Encounter (Signed)
Yes okay to refill. Thanks

## 2023-05-10 ENCOUNTER — Telehealth: Payer: Self-pay

## 2023-05-10 DIAGNOSIS — E538 Deficiency of other specified B group vitamins: Secondary | ICD-10-CM

## 2023-05-10 DIAGNOSIS — K50819 Crohn's disease of both small and large intestine with unspecified complications: Secondary | ICD-10-CM

## 2023-05-10 DIAGNOSIS — D649 Anemia, unspecified: Secondary | ICD-10-CM

## 2023-05-10 DIAGNOSIS — E559 Vitamin D deficiency, unspecified: Secondary | ICD-10-CM

## 2023-05-10 DIAGNOSIS — Z79899 Other long term (current) drug therapy: Secondary | ICD-10-CM

## 2023-05-10 NOTE — Telephone Encounter (Signed)
Labs entered. MyChart message sent to patient  

## 2023-05-10 NOTE — Telephone Encounter (Signed)
-----   Message from [facility] Earl H sent at 02/25/2023  8:11 AM EDT ----- Regarding: labs due Patient due for   CBC, Vitamin D, B12, TIBC / ferritin

## 2023-05-11 DIAGNOSIS — Z933 Colostomy status: Secondary | ICD-10-CM | POA: Diagnosis not present

## 2023-05-13 ENCOUNTER — Other Ambulatory Visit (HOSPITAL_COMMUNITY): Payer: Self-pay

## 2023-05-22 ENCOUNTER — Encounter (HOSPITAL_COMMUNITY): Payer: Self-pay

## 2023-05-24 ENCOUNTER — Ambulatory Visit: Payer: 59

## 2023-05-24 ENCOUNTER — Other Ambulatory Visit (INDEPENDENT_AMBULATORY_CARE_PROVIDER_SITE_OTHER): Payer: 59

## 2023-05-24 DIAGNOSIS — K50819 Crohn's disease of both small and large intestine with unspecified complications: Secondary | ICD-10-CM

## 2023-05-24 DIAGNOSIS — D649 Anemia, unspecified: Secondary | ICD-10-CM

## 2023-05-24 DIAGNOSIS — Z79899 Other long term (current) drug therapy: Secondary | ICD-10-CM

## 2023-05-24 DIAGNOSIS — E538 Deficiency of other specified B group vitamins: Secondary | ICD-10-CM

## 2023-05-24 DIAGNOSIS — E559 Vitamin D deficiency, unspecified: Secondary | ICD-10-CM

## 2023-05-24 LAB — CBC WITH DIFFERENTIAL/PLATELET
Basophils Absolute: 0 10*3/uL (ref 0.0–0.1)
Basophils Relative: 0.3 % (ref 0.0–3.0)
Eosinophils Absolute: 0.1 10*3/uL (ref 0.0–0.7)
Eosinophils Relative: 0.8 % (ref 0.0–5.0)
HCT: 35.5 % — ABNORMAL LOW (ref 39.0–52.0)
Hemoglobin: 11.7 g/dL — ABNORMAL LOW (ref 13.0–17.0)
Lymphocytes Relative: 23.7 % (ref 12.0–46.0)
Lymphs Abs: 1.8 10*3/uL (ref 0.7–4.0)
MCHC: 33 g/dL (ref 30.0–36.0)
MCV: 114.3 fl — ABNORMAL HIGH (ref 78.0–100.0)
Monocytes Absolute: 0.7 10*3/uL (ref 0.1–1.0)
Monocytes Relative: 9.9 % (ref 3.0–12.0)
Neutro Abs: 4.9 10*3/uL (ref 1.4–7.7)
Neutrophils Relative %: 65.3 % (ref 43.0–77.0)
Platelets: 348 10*3/uL (ref 150.0–400.0)
RBC: 3.09 Mil/uL — ABNORMAL LOW (ref 4.22–5.81)
RDW: 14.4 % (ref 11.5–15.5)
WBC: 7.6 10*3/uL (ref 4.0–10.5)

## 2023-05-25 ENCOUNTER — Other Ambulatory Visit: Payer: Self-pay

## 2023-05-25 DIAGNOSIS — R718 Other abnormality of red blood cells: Secondary | ICD-10-CM

## 2023-05-25 DIAGNOSIS — D649 Anemia, unspecified: Secondary | ICD-10-CM

## 2023-05-25 LAB — IBC + FERRITIN
Ferritin: 76.7 ng/mL (ref 22.0–322.0)
Iron: 64 ug/dL (ref 42–165)
Saturation Ratios: 23.9 % (ref 20.0–50.0)
TIBC: 267.4 ug/dL (ref 250.0–450.0)
Transferrin: 191 mg/dL — ABNORMAL LOW (ref 212.0–360.0)

## 2023-05-25 LAB — VITAMIN D 25 HYDROXY (VIT D DEFICIENCY, FRACTURES): VITD: 15.16 ng/mL — ABNORMAL LOW (ref 30.00–100.00)

## 2023-05-25 LAB — VITAMIN B12: Vitamin B-12: 650 pg/mL (ref 211–911)

## 2023-05-26 ENCOUNTER — Other Ambulatory Visit: Payer: Self-pay

## 2023-05-26 MED ORDER — VITAMIN D (ERGOCALCIFEROL) 1.25 MG (50000 UNIT) PO CAPS
50000.0000 [IU] | ORAL_CAPSULE | ORAL | 3 refills | Status: DC
Start: 2023-05-26 — End: 2024-03-07

## 2023-05-31 ENCOUNTER — Telehealth: Payer: Self-pay

## 2023-05-31 ENCOUNTER — Ambulatory Visit: Payer: 59 | Admitting: Family Medicine

## 2023-05-31 ENCOUNTER — Encounter: Payer: Self-pay | Admitting: Family Medicine

## 2023-05-31 ENCOUNTER — Ambulatory Visit: Payer: 59

## 2023-05-31 ENCOUNTER — Encounter: Payer: Self-pay | Admitting: Gastroenterology

## 2023-05-31 VITALS — BP 100/72 | HR 73 | Temp 98.8°F | Ht 71.0 in | Wt 126.8 lb

## 2023-05-31 DIAGNOSIS — D638 Anemia in other chronic diseases classified elsewhere: Secondary | ICD-10-CM

## 2023-05-31 DIAGNOSIS — F320 Major depressive disorder, single episode, mild: Secondary | ICD-10-CM

## 2023-05-31 DIAGNOSIS — Z23 Encounter for immunization: Secondary | ICD-10-CM | POA: Diagnosis not present

## 2023-05-31 DIAGNOSIS — N529 Male erectile dysfunction, unspecified: Secondary | ICD-10-CM

## 2023-05-31 DIAGNOSIS — K50818 Crohn's disease of both small and large intestine with other complication: Secondary | ICD-10-CM

## 2023-05-31 MED ORDER — SILDENAFIL CITRATE 25 MG PO TABS: 12.5000 mg | ORAL_TABLET | Freq: Every day | ORAL | 4 refills | Status: DC | PRN

## 2023-05-31 NOTE — Progress Notes (Signed)
Established Patient Office Visit   Subjective  Patient ID: Gregory Eskra., male    DOB: 10/21/1960  Age: 62 y.o. MRN: [id number]  Chief Complaint  Patient presents with   Medication Refill    Flu shot, medication refill and patient has questions about low red blood cells     Patient is a 62 year old male who presents for follow-up on chronic conditions.  Patient followed by GI for history of Crohn's disease s/p colostomy.  Patient notes diet is limited due to GI symptoms.  Eating mostly chicken and rice.  Notes frequent stools which are bothersome at times.  Patient recently switched to Rinvoq for treatment of Crohn's.  Recent labs with GI with stable anemia.  Referred to hematology by GI to rule out any other causes of anemia.  Pt upset/concerned why he needs to see specialist.  Pt states he stays at home.  Was going to church but has concerns about getting sick from others. Wants to try getting out more, but there are limited activities in his area.    Patient Active Problem List   Diagnosis Date Noted   Irritant contact dermatitis associated with fecal stoma    Fissure in ano    Colostomy complication (HCC)    Other osteoporosis without current pathological fracture 12/01/2021   Vitamin D deficiency 12/01/2021   COVID-19 virus infection 05/23/2021   Gastroenteritis due to COVID-19 virus    Crohn's disease (HCC) 05/22/2021   Exacerbation of Crohn's disease of small intestine (HCC) 12/19/2019   Crohn's disease of both small and lg int w unsp comp (HCC) 01/17/2018   Dehydration 01/17/2018   Hypokalemia 09/15/2017   AKI (acute kidney injury) (HCC) 09/15/2017   Hypomagnesemia 09/15/2017   Anemia 05/18/2017   Protein-calorie malnutrition, severe 04/30/2017   Anuresis 04/28/2017   Physical deconditioning 04/28/2017   Protein calorie malnutrition (HCC) 04/28/2017   Abdominal pain, left upper quadrant 04/28/2017   GERD (gastroesophageal reflux disease) 04/28/2017   Nephrostomy  complication (HCC) 04/28/2017   Depression 04/28/2017   Nausea & vomiting    Crohn's disease of both small and large intestine without complication (HCC)    Nephrolithiasis 04/10/2017   Past Medical History:  Diagnosis Date   Acute endocarditis    Blood transfusion without reported diagnosis    COVID 05/2021   Crohn's disease (HCC)    Depression    Eating disorder    GERD (gastroesophageal reflux disease)    Kidney stones    Osteoporosis    Short gut syndrome    Past Surgical History:  Procedure Laterality Date   COLONOSCOPY  06/11/2020   COLONOSCOPY  07/05/2018   COLONOSCOPY WITH PROPOFOL N/A 09/17/2017   Procedure: COLONOSCOPY WITH PROPOFOL;  Surgeon: Napoleon Form, MD;  Location: [facility];  Service: Endoscopy;  Laterality: N/A;  Through ileostomy.   CYSTOSCOPY WITH STENT PLACEMENT Left 05/20/2017   Procedure: CYSTOSCOPY WITH STENT PLACEMENT;  Surgeon: Crist Fat, MD;  Location: [facility];  Service: Urology;  Laterality: Left;   ILEOSTOMY     IR NEPHROSTOMY EXCHANGE LEFT  04/12/2017   IR NEPHROSTOMY PLACEMENT LEFT  04/12/2017   IR NEPHROSTOMY PLACEMENT LEFT  04/29/2017   NEPHROLITHOTOMY Left 05/14/2017   Procedure: LEFT NEPHROLITHOTOMY PERCUTANEOUS WITH SURGEON ACCESS;  Surgeon: Crist Fat, MD;  Location: [facility];  Service: Urology;  Laterality: Left;   SMALL INTESTINE SURGERY     SUBTOTAL COLECTOMY     Social History   Tobacco Use   Smoking  status: Former    Current packs/day: 0.00    Average packs/day: 1 pack/day for 20.0 years (20.0 ttl pk-yrs)    Types: Cigarettes    Start date: 08/31/1986    Quit date: 08/31/2006    Years since quitting: 16.7   Smokeless tobacco: Never  Vaping Use   Vaping status: Never Used  Substance Use Topics   Alcohol use: Yes    Comment: occ   Drug use: No   Family History  Problem Relation Age of Onset   Heart disease Mother    Diabetes Father    Heart disease Father    Heart attack Father    Cancer Sister     Heart disease Sister    Melanoma Sister    Anemia Sister    Crohn's disease Brother    Inflammatory bowel disease Brother    Diabetes Paternal Grandmother    Colon cancer Neg Hx    Rectal cancer Neg Hx    Stomach cancer Neg Hx    Esophageal cancer Neg Hx    Allergies  Allergen Reactions   Methotrexate Derivatives Other (See Comments)    Anemia, low WBC, severe GI symptoms,    Phenergan [Promethazine Hcl] Other (See Comments)    twitching   Humira [Adalimumab] Other (See Comments)    Intolerance   Penicillins Other (See Comments)    REACTION DURING SURGERY Has patient had a PCN reaction causing immediate rash, facial/tongue/throat swelling, SOB or lightheadedness with hypotension: Unknown Has patient had a PCN reaction causing severe rash involving mucus membranes or skin necrosis: Unknown Has patient had a PCN reaction that required hospitalization: Unknown Has patient had a PCN reaction occurring within the last 10 years: No If all of the above answers are "NO", then may proceed with Cephalosporin use.    Remicade [Infliximab] Other (See Comments)    Intolerance      ROS Negative unless stated above    Objective:     BP 100/72 (BP Location: Right Arm, Patient Position: Sitting, Cuff Size: Normal)   Pulse 73   Temp 98.8 F (37.1 C) (Oral)   Ht 5\' 11"  (1.803 m)   Wt 126 lb 12.8 oz (57.5 kg)   SpO2 97%   BMI 17.69 kg/m    Physical Exam Constitutional:      General: He is not in acute distress.    Appearance: Normal appearance. He is underweight.  HENT:     Head: Normocephalic and atraumatic.     Nose: Nose normal.     Mouth/Throat:     Mouth: Mucous membranes are moist.  Cardiovascular:     Rate and Rhythm: Normal rate and regular rhythm.     Heart sounds: Normal heart sounds. No murmur heard.    No gallop.  Pulmonary:     Effort: Pulmonary effort is normal. No respiratory distress.     Breath sounds: Normal breath sounds. No wheezing, rhonchi or rales.   Abdominal:     General: Abdomen is flat.     Palpations: Abdomen is soft.     Comments: Colostomy in place.  Skin:    General: Skin is warm and dry.  Neurological:     Mental Status: He is alert and oriented to person, place, and time.  Psychiatric:        Behavior: Behavior is cooperative.      No results found for any visits on 05/31/23.    05/31/2023    1:25 PM 08/25/2022   12:48 PM  08/19/2021    1:37 PM  Depression screen PHQ 2/9  Decreased Interest 0 0 0  Down, Depressed, Hopeless 0 0 0  PHQ - 2 Score 0 0 0  Altered sleeping 3    Tired, decreased energy 3    Change in appetite 3    Feeling bad or failure about yourself  0    Trouble concentrating 0    Moving slowly or fidgety/restless 0    Suicidal thoughts 0    PHQ-9 Score 9    Difficult doing work/chores Somewhat difficult        05/31/2023    1:25 PM  GAD 7 : Generalized Anxiety Score  Nervous, Anxious, on Edge 1  Control/stop worrying 0  Worry too much - different things 0  Trouble relaxing 0  Restless 0  Easily annoyed or irritable 0  Afraid - awful might happen 0  Total GAD 7 Score 1  Anxiety Difficulty Not difficult at all       Assessment & Plan:  Crohn's disease of both small and large intestine with other complication (HCC) -s/p colostomy -Continue Rinvoq -Discussed nutrition consult given decreased intake due to most foods causing GI distress/discomfort -Continue vitamin B12 injections -Continue follow-up with GI -     Referral to Nutrition and Diabetes Services  Need for influenza vaccination -     Flu vaccine trivalent PF, 6mos and older(Flulaval,Afluria,Fluarix,Fluzone)  Anemia in other chronic diseases classified elsewhere -Discussed symptoms likely 2/2 severe Crohn's disease causing decreased absorption.  Advised   hematology consult appropriate to rule out any other contributing factors. -Patient to call back to set up appointment with heme-onc -     Referral to Nutrition and  Diabetes Services  Erectile dysfunction, unspecified erectile dysfunction type -     Sildenafil Citrate; Take 0.5 tablets (12.5 mg total) by mouth daily as needed for erectile dysfunction.  Dispense: 10 tablet; Refill: 4  Depression, major, single episode, mild (HCC) -PHQ-9 score 9 this visit -Discussed the importance of self-care -Consider counseling -For continued or worsening symptoms consider starting medication.   Return in about 5 months (around 10/29/2023), or if symptoms worsen or fail to improve.   [name], MD

## 2023-05-31 NOTE — Telephone Encounter (Signed)
Pt has two appt: flu clinic and in office visit with Dr. Salomon Fick. Contacted pt.  Trying to see if pt wants to cancel his flu clinic since he will be seeing in office at 1:30.   Left a detail voicemail.

## 2023-06-04 ENCOUNTER — Other Ambulatory Visit: Payer: Self-pay

## 2023-06-04 NOTE — Progress Notes (Signed)
Specialty Pharmacy Refill Coordination Note  Hobson Lax. is a 62 y.o. male contacted today regarding refills of specialty medication(s) Upadacitinib   Patient requested Delivery   Delivery date: 06/08/23   Verified address: Patient address [address]  [place] [address]   Medication will be filled on 06/07/23.

## 2023-06-07 ENCOUNTER — Other Ambulatory Visit: Payer: Self-pay

## 2023-06-10 DIAGNOSIS — Z933 Colostomy status: Secondary | ICD-10-CM | POA: Diagnosis not present

## 2023-06-14 ENCOUNTER — Inpatient Hospital Stay: Payer: 59

## 2023-06-14 ENCOUNTER — Encounter: Payer: Self-pay | Admitting: Hematology and Oncology

## 2023-06-14 ENCOUNTER — Inpatient Hospital Stay: Payer: 59 | Attending: Hematology and Oncology | Admitting: Hematology and Oncology

## 2023-06-14 VITALS — BP 122/74 | HR 79 | Temp 97.4°F | Resp 18 | Ht 71.0 in | Wt 130.6 lb

## 2023-06-14 DIAGNOSIS — Z87891 Personal history of nicotine dependence: Secondary | ICD-10-CM | POA: Insufficient documentation

## 2023-06-14 DIAGNOSIS — M810 Age-related osteoporosis without current pathological fracture: Secondary | ICD-10-CM | POA: Insufficient documentation

## 2023-06-14 DIAGNOSIS — K5090 Crohn's disease, unspecified, without complications: Secondary | ICD-10-CM | POA: Diagnosis not present

## 2023-06-14 DIAGNOSIS — K219 Gastro-esophageal reflux disease without esophagitis: Secondary | ICD-10-CM | POA: Diagnosis not present

## 2023-06-14 DIAGNOSIS — E559 Vitamin D deficiency, unspecified: Secondary | ICD-10-CM

## 2023-06-14 DIAGNOSIS — E538 Deficiency of other specified B group vitamins: Secondary | ICD-10-CM | POA: Diagnosis not present

## 2023-06-14 DIAGNOSIS — R5383 Other fatigue: Secondary | ICD-10-CM

## 2023-06-14 DIAGNOSIS — Z79899 Other long term (current) drug therapy: Secondary | ICD-10-CM | POA: Diagnosis not present

## 2023-06-14 DIAGNOSIS — E43 Unspecified severe protein-calorie malnutrition: Secondary | ICD-10-CM | POA: Diagnosis not present

## 2023-06-14 DIAGNOSIS — D509 Iron deficiency anemia, unspecified: Secondary | ICD-10-CM | POA: Diagnosis not present

## 2023-06-14 DIAGNOSIS — D539 Nutritional anemia, unspecified: Secondary | ICD-10-CM | POA: Diagnosis not present

## 2023-06-14 DIAGNOSIS — K90829 Short bowel syndrome, unspecified: Secondary | ICD-10-CM | POA: Insufficient documentation

## 2023-06-14 DIAGNOSIS — E860 Dehydration: Secondary | ICD-10-CM | POA: Diagnosis not present

## 2023-06-14 DIAGNOSIS — Z8616 Personal history of COVID-19: Secondary | ICD-10-CM | POA: Insufficient documentation

## 2023-06-14 DIAGNOSIS — Z87442 Personal history of urinary calculi: Secondary | ICD-10-CM | POA: Diagnosis not present

## 2023-06-14 LAB — CBC WITH DIFFERENTIAL (CANCER CENTER ONLY)
Abs Immature Granulocytes: 0.04 10*3/uL (ref 0.00–0.07)
Basophils Absolute: 0 10*3/uL (ref 0.0–0.1)
Basophils Relative: 0 %
Eosinophils Absolute: 0 10*3/uL (ref 0.0–0.5)
Eosinophils Relative: 0 %
HCT: 34.9 % — ABNORMAL LOW (ref 39.0–52.0)
Hemoglobin: 11.9 g/dL — ABNORMAL LOW (ref 13.0–17.0)
Immature Granulocytes: 0 %
Lymphocytes Relative: 19 %
Lymphs Abs: 1.9 10*3/uL (ref 0.7–4.0)
MCH: 36.4 pg — ABNORMAL HIGH (ref 26.0–34.0)
MCHC: 34.1 g/dL (ref 30.0–36.0)
MCV: 106.7 fL — ABNORMAL HIGH (ref 80.0–100.0)
Monocytes Absolute: 0.9 10*3/uL (ref 0.1–1.0)
Monocytes Relative: 9 %
Neutro Abs: 6.9 10*3/uL (ref 1.7–7.7)
Neutrophils Relative %: 72 %
Platelet Count: 294 10*3/uL (ref 150–400)
RBC: 3.27 MIL/uL — ABNORMAL LOW (ref 4.22–5.81)
RDW: 12.9 % (ref 11.5–15.5)
WBC Count: 9.8 10*3/uL (ref 4.0–10.5)
nRBC: 0 % (ref 0.0–0.2)

## 2023-06-14 LAB — CMP (CANCER CENTER ONLY)
ALT: 11 U/L (ref 0–44)
AST: 15 U/L (ref 15–41)
Albumin: 3.1 g/dL — ABNORMAL LOW (ref 3.5–5.0)
Alkaline Phosphatase: 59 U/L (ref 38–126)
Anion gap: 4 — ABNORMAL LOW (ref 5–15)
BUN: 13 mg/dL (ref 8–23)
CO2: 23 mmol/L (ref 22–32)
Calcium: 7.5 mg/dL — ABNORMAL LOW (ref 8.9–10.3)
Chloride: 112 mmol/L — ABNORMAL HIGH (ref 98–111)
Creatinine: 1.48 mg/dL — ABNORMAL HIGH (ref 0.61–1.24)
GFR, Estimated: 53 mL/min — ABNORMAL LOW (ref >60)
Glucose, Bld: 113 mg/dL — ABNORMAL HIGH (ref 70–99)
Potassium: 3.7 mmol/L (ref 3.5–5.1)
Sodium: 139 mmol/L (ref 135–145)
Total Bilirubin: 1.3 mg/dL — ABNORMAL HIGH (ref 0.3–1.2)
Total Protein: 5.4 g/dL — ABNORMAL LOW (ref 6.5–8.1)

## 2023-06-14 LAB — RETICULOCYTES
Immature Retic Fract: 18.5 % — ABNORMAL HIGH (ref 2.3–15.9)
RBC.: 3.34 MIL/uL — ABNORMAL LOW (ref 4.22–5.81)
Retic Count, Absolute: 52.4 10*3/uL (ref 19.0–186.0)
Retic Ct Pct: 1.6 % (ref 0.4–3.1)

## 2023-06-14 NOTE — Assessment & Plan Note (Signed)
He has signs of protein calorie malnutrition I encouraged the patient to increase his oral fluid intake especially with higher protein diet

## 2023-06-14 NOTE — Assessment & Plan Note (Signed)
He has persistent vitamin D deficiencies Recommend increase oral vitamin D supplement

## 2023-06-14 NOTE — Progress Notes (Signed)
[facility] CONSULT NOTE  Patient Care Team: [name], MD as PCP - General (Family Medicine)  ASSESSMENT & PLAN:  Deficiency anemia Cause of his anemia is multifactorial He had history of vitamin B12 deficiency but recent test a month ago suggested adequate replacement He could have other trace mineral deficiencies due to extensive surgery and mild absorption and I will order additional workup He also noted to have borderline chronic kidney disease that could be contributing to anemia chronic illness I will call him with test results and treat his anemia accordingly  Vitamin D deficiency He has persistent vitamin D deficiencies Recommend increase oral vitamin D supplement  Protein calorie malnutrition (HCC) He has signs of protein calorie malnutrition I encouraged the patient to increase his oral fluid intake especially with higher protein diet Orders Placed This Encounter  Procedures   CBC with Differential (Cancer Center Only)    Standing Status:   Future    Standing Expiration Date:   06/13/2024   CMP (Cancer Center only)    Standing Status:   Future    Standing Expiration Date:   06/13/2024   Reticulocytes    Standing Status:   Future    Standing Expiration Date:   06/13/2024   TSH    Standing Status:   Future    Standing Expiration Date:   06/13/2024   Methylmalonic acid, serum    Standing Status:   Future    Standing Expiration Date:   06/13/2024   Folate RBC    Standing Status:   Future    Standing Expiration Date:   06/13/2024   Copper, serum    Standing Status:   Future    Standing Expiration Date:   06/13/2024   Vitamin B1    Standing Status:   Future    Standing Expiration Date:   06/13/2024   Zinc    Standing Status:   Future    Standing Expiration Date:   06/13/2024    All questions were answered. The patient knows to call the clinic with any problems, questions or concerns.  The total time spent in the appointment was 60  minutes encounter with patients including review of chart and various tests results, discussions about plan of care and coordination of care plan  Artis Delay, MD 10/14/20243:04 PM   CHIEF COMPLAINTS/PURPOSE OF CONSULTATION:  Anemia  HISTORY OF PRESENTING ILLNESS:  Gregory [name]. 62 y.o. male is here because of anemia  He was found to have abnormal CBC from recent blood draw The patient have extensive surgeries in the past secondary to Crohn's disease.  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  He also have history of endocarditis and kidney stones He was found to have microcytic anemia from blood draw He has frequent blood draw due to significant trace mineral deficiencies with confirm vitamin D deficiency and vitamin B12 deficiencies  He denies recent chest pain on exertion, shortness of breath on minimal exertion, pre-syncopal episodes, or palpitations. He had not noticed any recent bleeding such as epistaxis, hematuria or hematochezia The patient denies over the counter NSAID ingestion. He is not on antiplatelets agents. His last colonoscopy on August 2023 showed diffuse ileitis and inflammation He had no prior history or diagnosis of cancer. His age appropriate screening programs are up-to-date. He denies any pica and eats a variety of diet. He never donated blood  The patient was prescribed oral iron supplements and he  takes one daily. He has lost some weight from peak weight of around 142 pounds but he is gaining some weight back  MEDICAL HISTORY:  Past Medical History:  Diagnosis Date   Acute endocarditis    Blood transfusion without reported diagnosis    COVID 05/2021   Crohn's disease (HCC)    Depression    Eating disorder    GERD (gastroesophageal reflux disease)    Kidney stones    Osteoporosis    Short gut syndrome     SURGICAL HISTORY: Past Surgical History:  Procedure Laterality Date   COLONOSCOPY  06/11/2020    COLONOSCOPY  07/05/2018   COLONOSCOPY WITH PROPOFOL N/A 09/17/2017   Procedure: COLONOSCOPY WITH PROPOFOL;  Surgeon: Napoleon Form, MD;  Location: [facility];  Service: Endoscopy;  Laterality: N/A;  Through ileostomy.   CYSTOSCOPY WITH STENT PLACEMENT Left 05/20/2017   Procedure: CYSTOSCOPY WITH STENT PLACEMENT;  Surgeon: Crist Fat, MD;  Location: [facility];  Service: Urology;  Laterality: Left;   ILEOSTOMY     IR NEPHROSTOMY EXCHANGE LEFT  04/12/2017   IR NEPHROSTOMY PLACEMENT LEFT  04/12/2017   IR NEPHROSTOMY PLACEMENT LEFT  04/29/2017   NEPHROLITHOTOMY Left 05/14/2017   Procedure: LEFT NEPHROLITHOTOMY PERCUTANEOUS WITH SURGEON ACCESS;  Surgeon: Crist Fat, MD;  Location: [facility];  Service: Urology;  Laterality: Left;   SMALL INTESTINE SURGERY     SUBTOTAL COLECTOMY      SOCIAL HISTORY: Social History   Socioeconomic History   Marital status: Divorced    Spouse name: Not on file   Number of children: 2   Years of education: Not on file   Highest education level: Associate degree: academic program  Occupational History   Occupation: Retired   Occupation: On disability  Tobacco Use   Smoking status: Former    Current packs/day: 0.00    Average packs/day: 1 pack/day for 20.0 years (20.0 ttl pk-yrs)    Types: Cigarettes    Start date: 08/31/1986    Quit date: 08/31/2006    Years since quitting: 16.7   Smokeless tobacco: Never  Vaping Use   Vaping status: Never Used  Substance and Sexual Activity   Alcohol use: Yes    Comment: occ   Drug use: No   Sexual activity: Not Currently  Other Topics Concern   Not on file  Social History Narrative   Not on file   Social Determinants of Health   Financial Resource Strain: Medium Risk (05/27/2023)   Overall Financial Resource Strain (CARDIA)    Difficulty of Paying Living Expenses: Somewhat hard  Food Insecurity: Patient Declined (05/27/2023)   Hunger Vital Sign    Worried About Running Out of Food in the Last  Year: Patient declined    Ran Out of Food in the Last Year: Patient declined  Transportation Needs: Unmet Transportation Needs (05/27/2023)   PRAPARE - Administrator, Civil Service (Medical): Yes    Lack of Transportation (Non-Medical): Yes  Physical Activity: Inactive (05/27/2023)   Exercise Vital Sign    Days of Exercise per Week: 0 days    Minutes of Exercise per Session: 30 min  Stress: No Stress Concern Present (05/27/2023)   [employer] of Occupational Health - Occupational Stress Questionnaire    Feeling of Stress : Not at all  Social Connections: Moderately Integrated (05/27/2023)   Social Connection and Isolation Panel [NHANES]    Frequency of Communication with Friends and Family: Three times a week  Frequency of Social Gatherings with Friends and Family: Once a week    Attends Religious Services: 1 to 4 times per year    Active Member of [employer] or Organizations: No    Attends Engineer, structural: More than 4 times per year    Marital Status: Divorced  Catering manager Violence: Not At Risk (08/25/2022)   Humiliation, Afraid, Rape, and Kick questionnaire    Fear of Current or Ex-Partner: No    Emotionally Abused: No    Physically Abused: No    Sexually Abused: No    FAMILY HISTORY: Family History  Problem Relation Age of Onset   Heart disease Mother    Diabetes Father    Heart disease Father    Heart attack Father    Cancer Sister    Heart disease Sister    Melanoma Sister    Anemia Sister    Crohn's disease Brother    Inflammatory bowel disease Brother    Diabetes Paternal Grandmother    Colon cancer Neg Hx    Rectal cancer Neg Hx    Stomach cancer Neg Hx    Esophageal cancer Neg Hx     ALLERGIES:  is allergic to methotrexate derivatives, phenergan [promethazine hcl], humira [adalimumab], penicillins, and remicade [infliximab].  MEDICATIONS:  Current Outpatient Medications  Medication Sig Dispense Refill   acetaminophen  (TYLENOL) 500 MG tablet Take 500 mg by mouth every 6 (six) hours as needed for moderate pain.     cyanocobalamin (VITAMIN B12) 1000 MCG/ML injection Inject 1 mL (1,000 mcg total) into the muscle every 30 (thirty) days. 3 mL 1   dicyclomine (BENTYL) 10 MG capsule Take 1 capsule (10 mg total) by mouth every 8 (eight) hours as needed (abdominal cramping). 30 capsule 1   diphenoxylate-atropine (LOMOTIL) 2.5-0.025 MG tablet TAKE 1 TABLET 4 TIMES A DAY AS NEEDED FOR DIARRHEA OR LOOSE STOOLS 120 tablet 3   Multiple Vitamin (MULTIVITAMIN WITH MINERALS) TABS tablet Take 1 tablet by mouth daily. 30 tablet 3   omeprazole (PRILOSEC) 20 MG capsule TAKE ONE CAPSULE ONCE DAILY 90 capsule 1   ondansetron (ZOFRAN) 4 MG tablet TAKE 1 TABLET EVERY 8 HOURS AS NEEDED FOR NASUEA OR VOMITING 30 tablet 1   potassium chloride (KLOR-CON) 20 MEQ packet Take 20 mEq by mouth daily. 90 each 1   sertraline (ZOLOFT) 50 MG tablet Take 1 tablet (50 mg total) by mouth daily. 90 tablet 1   sildenafil (VIAGRA) 25 MG tablet Take 0.5 tablets (12.5 mg total) by mouth daily as needed for erectile dysfunction. 10 tablet 4   traMADol (ULTRAM) 50 MG tablet TAKE ONE TABLET EVERY 8 HOURS AS NEEDED FOR PAIN (Patient taking differently: Take 50 mg by mouth daily.) 90 tablet 0   Upadacitinib ER (RINVOQ) 30 MG TB24 Take 1 tablet by mouth daily. 90 tablet 3   Vitamin D, Ergocalciferol, (DRISDOL) 1.25 MG (50000 UNIT) CAPS capsule Take 1 capsule (50,000 Units total) by mouth once a week. 12 capsule 3   No current facility-administered medications for this visit.    REVIEW OF SYSTEMS:   Constitutional: Denies fevers, chills or abnormal night sweats Eyes: Denies blurriness of vision, double vision or watery eyes Ears, nose, mouth, throat, and face: Denies mucositis or sore throat Respiratory: Denies cough, dyspnea or wheezes Cardiovascular: Denies palpitation, chest discomfort or lower extremity swelling Skin: Denies abnormal skin  rashes Lymphatics: Denies new lymphadenopathy or easy bruising Neurological:Denies numbness, tingling or new weaknesses Behavioral/Psych: Mood is stable, no  new changes  All other systems were reviewed with the patient and are negative.  PHYSICAL EXAMINATION: ECOG PERFORMANCE STATUS: 1 - Symptomatic but completely ambulatory  Vitals:   06/14/23 1428  BP: 122/74  Pulse: 79  Resp: 18  Temp: (!) 97.4 F (36.3 C)  SpO2: 100%   Filed Weights   06/14/23 1428  Weight: 130 lb 9.6 oz (59.2 kg)    GENERAL:alert, no distress and comfortable.  He looks thin SKIN: skin color, texture, turgor are normal, no rashes or significant lesions EYES: normal, conjunctiva are pink and non-injected, sclera clear OROPHARYNX:no exudate, no erythema and lips, buccal mucosa, and tongue normal  NECK: supple, thyroid normal size, non-tender, without nodularity LYMPH:  no palpable lymphadenopathy in the cervical, axillary or inguinal LUNGS: clear to auscultation and percussion with normal breathing effort HEART: regular rate & rhythm and no murmurs and no lower extremity edema ABDOMEN:abdomen soft, non-tender and normal bowel sounds with well-healed surgical scar and presence of colostomy Musculoskeletal:no cyanosis of digits and no clubbing  PSYCH: alert & oriented x 3 with fluent speech NEURO: no focal motor/sensory deficits

## 2023-06-14 NOTE — Assessment & Plan Note (Signed)
Cause of his anemia is multifactorial He had history of vitamin B12 deficiency but recent test a month ago suggested adequate replacement He could have other trace mineral deficiencies due to extensive surgery and mild absorption and I will order additional workup He also noted to have borderline chronic kidney disease that could be contributing to anemia chronic illness I will call him with test results and treat his anemia accordingly

## 2023-06-15 LAB — TSH: TSH: 2.533 u[IU]/mL (ref 0.350–4.500)

## 2023-06-15 LAB — FOLATE RBC
Folate, Hemolysate: 291 ng/mL
Folate, RBC: 815 ng/mL (ref >498)
Hematocrit: 35.7 % — ABNORMAL LOW (ref 37.5–51.0)

## 2023-06-16 LAB — COPPER, SERUM: Copper: 56 ug/dL — ABNORMAL LOW (ref 69–132)

## 2023-06-16 LAB — ZINC: Zinc: 50 ug/dL (ref 44–115)

## 2023-06-16 LAB — METHYLMALONIC ACID, SERUM: Methylmalonic Acid, Quantitative: 1280 nmol/L — ABNORMAL HIGH (ref 0–378)

## 2023-06-17 ENCOUNTER — Telehealth: Payer: Self-pay

## 2023-06-17 ENCOUNTER — Other Ambulatory Visit: Payer: Self-pay | Admitting: Hematology and Oncology

## 2023-06-17 DIAGNOSIS — D539 Nutritional anemia, unspecified: Secondary | ICD-10-CM

## 2023-06-17 DIAGNOSIS — E610 Copper deficiency: Secondary | ICD-10-CM | POA: Insufficient documentation

## 2023-06-17 LAB — VITAMIN B1: Vitamin B1 (Thiamine): 130.7 nmol/L (ref 66.5–200.0)

## 2023-06-17 NOTE — Telephone Encounter (Signed)
Called and left a message asking him to call the office back.

## 2023-06-17 NOTE — Telephone Encounter (Signed)
Called him back and given below message. He verbalized understanding and will start the Copper supplement. He agrees to appt in 3 months.

## 2023-06-17 NOTE — Telephone Encounter (Signed)
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

## 2023-07-02 ENCOUNTER — Other Ambulatory Visit: Payer: Self-pay

## 2023-07-02 NOTE — Progress Notes (Signed)
Specialty Pharmacy Refill Coordination Note  Gregory [name]. is a 62 y.o. male contacted today regarding refills of specialty medication(s) Upadacitinib   Patient requested Delivery   Delivery date: 07/08/23   Verified address: Patient address [address]  [place] [address]   Medication will be filled on 07/07/23.

## 2023-07-06 ENCOUNTER — Other Ambulatory Visit: Payer: Self-pay | Admitting: Gastroenterology

## 2023-07-07 ENCOUNTER — Other Ambulatory Visit: Payer: Self-pay

## 2023-07-09 ENCOUNTER — Encounter: Payer: Self-pay | Admitting: Gastroenterology

## 2023-07-12 DIAGNOSIS — Z933 Colostomy status: Secondary | ICD-10-CM | POA: Diagnosis not present

## 2023-07-22 ENCOUNTER — Ambulatory Visit: Payer: 59 | Admitting: Nutrition

## 2023-08-03 ENCOUNTER — Other Ambulatory Visit: Payer: Self-pay

## 2023-08-03 NOTE — Progress Notes (Signed)
Specialty Pharmacy Refill Coordination Note  Gregory [name]. is a 62 y.o. male contacted today regarding refills of specialty medication(s) Upadacitinib   Patient requested Delivery   Delivery date: 08/09/23   Verified address: Patient address [address]  [place] [address]   Medication will be filled on 08/06/23.

## 2023-08-11 DIAGNOSIS — Z933 Colostomy status: Secondary | ICD-10-CM | POA: Diagnosis not present

## 2023-08-12 ENCOUNTER — Other Ambulatory Visit: Payer: Self-pay | Admitting: Family Medicine

## 2023-08-16 ENCOUNTER — Telehealth: Payer: Self-pay

## 2023-08-16 NOTE — Telephone Encounter (Signed)
Questions have been answered and chart notes attached.

## 2023-08-16 NOTE — Telephone Encounter (Signed)
Pharmacy Patient Advocate Encounter   Received notification from CoverMyMeds that prior authorization for Rinvoq 30 mg er tablets is required/requested.   Insurance verification completed.   The patient is insured through [facility] .   Per test claim: PA required; PA started via CoverMyMeds. KEY [id number] . Waiting for clinical questions to populate.

## 2023-08-30 ENCOUNTER — Ambulatory Visit (INDEPENDENT_AMBULATORY_CARE_PROVIDER_SITE_OTHER): Payer: 59

## 2023-08-30 VITALS — Ht 71.0 in | Wt 130.0 lb

## 2023-08-30 DIAGNOSIS — Z0000 Encounter for general adult medical examination without abnormal findings: Secondary | ICD-10-CM

## 2023-08-30 NOTE — Progress Notes (Signed)
Subjective:   Gregory Dohn. is a 62 y.o. male who presents for Medicare Annual/Subsequent preventive examination.  Visit Complete: Virtual I connected with  Gregory [name]. on 08/30/23 by a audio enabled telemedicine application and verified that I am speaking with the correct person using two identifiers.  Patient Location: Home  Provider Location: Home Office  I discussed the limitations of evaluation and management by telemedicine. The patient expressed understanding and agreed to proceed.  Vital Signs: Because this visit was a virtual/telehealth visit, some criteria may be missing or patient reported. Any vitals not documented were not able to be obtained and vitals that have been documented are patient reported.  Patient Medicare AWV questionnaire was completed by the patient on 08/26/23; I have confirmed that all information answered by patient is correct and no changes since this date.  Cardiac Risk Factors include: advanced age (>40men, >40 women);male gender     Objective:    Today's Vitals   08/30/23 1137  Weight: 130 lb (59 kg)  Height: 5\' 11"  (1.803 m)   Body mass index is 18.13 kg/m.     08/30/2023   11:50 AM 08/25/2022   12:56 PM 08/19/2021    1:37 PM 05/22/2021    4:23 PM 08/13/2020   11:53 AM 12/19/2019    6:42 PM 12/19/2019    2:33 PM  Advanced Directives  Does Patient Have a Medical Advance Directive? Yes Yes Yes No Yes Yes Yes  Type of Estate agent of [place];Living will Healthcare Power of [place];Living will Healthcare Power of [place];Living will  Living will Healthcare Power of [place];Living will Healthcare Power of [place];Living will  Does patient want to make changes to medical advance directive?      No - Patient declined   Copy of Healthcare Power of Attorney in Chart? No - copy requested No - copy requested No - copy requested   No - copy requested   Would patient like information on creating a medical  advance directive?    No - Patient declined       Current Medications (verified) Outpatient Encounter Medications as of 08/30/2023  Medication Sig   acetaminophen (TYLENOL) 500 MG tablet Take 500 mg by mouth every 6 (six) hours as needed for moderate pain.   cyanocobalamin (VITAMIN B12) 1000 MCG/ML injection Inject 1 mL (1,000 mcg total) into the muscle every 30 (thirty) days.   dicyclomine (BENTYL) 10 MG capsule Take 1 capsule (10 mg total) by mouth every 8 (eight) hours as needed (abdominal cramping).   diphenoxylate-atropine (LOMOTIL) 2.5-0.025 MG tablet TAKE 1 TABLET 4 TIMES A DAY AS NEEDED FOR DIARRHEA OR LOOSE STOOLS   Multiple Vitamin (MULTIVITAMIN WITH MINERALS) TABS tablet Take 1 tablet by mouth daily.   omeprazole (PRILOSEC) 20 MG capsule TAKE ONE CAPSULE ONCE DAILY   ondansetron (ZOFRAN) 4 MG tablet TAKE 1 TABLET EVERY 8 HOURS AS NEEDED FOR NASUEA OR VOMITING   potassium chloride (KLOR-CON) 20 MEQ packet Take 20 mEq by mouth daily.   sertraline (ZOLOFT) 50 MG tablet Take 1 tablet (50 mg total) by mouth daily.   sildenafil (VIAGRA) 25 MG tablet Take 0.5 tablets (12.5 mg total) by mouth daily as needed for erectile dysfunction.   traMADol (ULTRAM) 50 MG tablet TAKE ONE TABLET EVERY 8 HOURS AS NEEDED FOR PAIN (Patient taking differently: Take 50 mg by mouth daily.)   Upadacitinib ER (RINVOQ) 30 MG TB24 Take 1 tablet by mouth daily.   Vitamin D, Ergocalciferol, (  DRISDOL) 1.25 MG (50000 UNIT) CAPS capsule Take 1 capsule (50,000 Units total) by mouth once a week.   No facility-administered encounter medications on file as of 08/30/2023.    Allergies (verified) Methotrexate derivatives, Phenergan [promethazine hcl], Humira [adalimumab], Penicillins, and Remicade [infliximab]   History: Past Medical History:  Diagnosis Date   Acute endocarditis    Blood transfusion without reported diagnosis    COVID 05/2021   Crohn's disease (HCC)    Depression    Eating disorder    GERD  (gastroesophageal reflux disease)    Kidney stones    Osteoporosis    Short gut syndrome    Past Surgical History:  Procedure Laterality Date   COLONOSCOPY  06/11/2020   COLONOSCOPY  07/05/2018   COLONOSCOPY WITH PROPOFOL N/A 09/17/2017   Procedure: COLONOSCOPY WITH PROPOFOL;  Surgeon: Napoleon Form, MD;  Location: [facility];  Service: Endoscopy;  Laterality: N/A;  Through ileostomy.   CYSTOSCOPY WITH STENT PLACEMENT Left 05/20/2017   Procedure: CYSTOSCOPY WITH STENT PLACEMENT;  Surgeon: Crist Fat, MD;  Location: [facility];  Service: Urology;  Laterality: Left;   ILEOSTOMY     IR NEPHROSTOMY EXCHANGE LEFT  04/12/2017   IR NEPHROSTOMY PLACEMENT LEFT  04/12/2017   IR NEPHROSTOMY PLACEMENT LEFT  04/29/2017   NEPHROLITHOTOMY Left 05/14/2017   Procedure: LEFT NEPHROLITHOTOMY PERCUTANEOUS WITH SURGEON ACCESS;  Surgeon: Crist Fat, MD;  Location: [facility];  Service: Urology;  Laterality: Left;   SMALL INTESTINE SURGERY     SUBTOTAL COLECTOMY     Family History  Problem Relation Age of Onset   Heart disease Mother    Diabetes Father    Heart disease Father    Heart attack Father    Cancer Sister    Heart disease Sister    Melanoma Sister    Anemia Sister    Crohn's disease Brother    Inflammatory bowel disease Brother    Diabetes Paternal Grandmother    Colon cancer Neg Hx    Rectal cancer Neg Hx    Stomach cancer Neg Hx    Esophageal cancer Neg Hx    Social History   Socioeconomic History   Marital status: Divorced    Spouse name: Not on file   Number of children: 2   Years of education: Not on file   Highest education level: Associate degree: academic program  Occupational History   Occupation: Retired   Occupation: On disability  Tobacco Use   Smoking status: Former    Current packs/day: 0.00    Average packs/day: 1 pack/day for 20.0 years (20.0 ttl pk-yrs)    Types: Cigarettes    Start date: 08/31/1986    Quit date: 08/31/2006    Years since  quitting: 17.0   Smokeless tobacco: Never  Vaping Use   Vaping status: Never Used  Substance and Sexual Activity   Alcohol use: Yes    Comment: occ   Drug use: No   Sexual activity: Not Currently  Other Topics Concern   Not on file  Social History Narrative   Not on file   Social Drivers of Health   Financial Resource Strain: Low Risk  (08/30/2023)   Overall Financial Resource Strain (CARDIA)    Difficulty of Paying Living Expenses: Not hard at all  Food Insecurity: No Food Insecurity (08/30/2023)   Hunger Vital Sign    Worried About Running Out of Food in the Last Year: Never true    Ran Out of Food in the  Last Year: Never true  Transportation Needs: No Transportation Needs (08/30/2023)   PRAPARE - Administrator, Civil Service (Medical): No    Lack of Transportation (Non-Medical): No  Physical Activity: Sufficiently Active (08/30/2023)   Exercise Vital Sign    Days of Exercise per Week: 5 days    Minutes of Exercise per Session: 60 min  Stress: No Stress Concern Present (08/30/2023)   [employer] of Occupational Health - Occupational Stress Questionnaire    Feeling of Stress : Not at all  Social Connections: Moderately Integrated (08/30/2023)   Social Connection and Isolation Panel [NHANES]    Frequency of Communication with Friends and Family: More than three times a week    Frequency of Social Gatherings with Friends and Family: More than three times a week    Attends Religious Services: More than 4 times per year    Active Member of [employer] or Organizations: Yes    Attends Engineer, structural: More than 4 times per year    Marital Status: Divorced    Tobacco Counseling Counseling given: Not Answered   Clinical Intake:  Pre-visit preparation completed: Yes  Pain : No/denies pain     BMI - recorded: 18.13 Nutritional Status: BMI <19  Underweight Nutritional Risks: None Diabetes: No  How often do you need to have someone help you  when you read instructions, pamphlets, or other written materials from your doctor or pharmacy?: 1 - Never  Interpreter Needed?: No  Information entered by :: Theresa Mulligan LPN   Activities of Daily Living    08/30/2023   11:45 AM 08/26/2023   12:09 PM  In your present state of health, do you have any difficulty performing the following activities:  Hearing? 0 0  Vision? 0 0  Difficulty concentrating or making decisions? 0 0  Walking or climbing stairs? 0 0  Dressing or bathing? 0 0  Doing errands, shopping? 0 0  Preparing Food and eating ? N N  Using the Toilet? N N  In the past six months, have you accidently leaked urine? N N  Do you have problems with loss of bowel control? Y Y  Comment Dx: Crohns Disease.  Wears Depends followed by medical attention   Managing your Medications? N N  Managing your Finances? N N  Housekeeping or managing your Housekeeping? N N    Patient Care Team: [name], MD as PCP - General (Family Medicine)  Indicate any recent Medical Services you may have received from other than [facility] providers in the past year (date may be approximate).     Assessment:   This is a routine wellness examination for Jinu.  Hearing/Vision screen Hearing Screening - Comments:: Denies hearing difficulties   Vision Screening - Comments:: Wears rx glasses - up to date with routine eye exams with  [facility]   Goals Addressed               This Visit's Progress     Eat healthy (pt-stated)         Depression Screen    08/30/2023   11:44 AM 05/31/2023    1:25 PM 08/25/2022   12:48 PM 08/19/2021    1:37 PM 08/11/2021   11:35 AM 08/13/2020   11:50 AM 05/11/2017    1:09 PM  PHQ 2/9 Scores  PHQ - 2 Score 0 0 0 0 0 0 0  PHQ- 9 Score  9   3  Fall Risk    08/30/2023   11:48 AM 08/26/2023   12:09 PM 05/31/2023    1:25 PM 08/25/2022   12:54 PM 08/19/2021    1:37 PM  Fall Risk   Falls in the past year? 0 0 0 1 0  Number falls in  past yr: 0 0 0 0   Injury with Fall? 0 0 0 1   Comment    left knee injury, followed by medical attention   Risk for fall due to : No Fall Risks  No Fall Risks No Fall Risks Medication side effect  Follow up Falls prevention discussed  Falls evaluation completed Falls prevention discussed Falls evaluation completed;Education provided;Falls prevention discussed    MEDICARE RISK AT HOME: Medicare Risk at Home Any stairs in or around the home?: (Patient-Rptd) Yes If so, are there any without handrails?: (Patient-Rptd) No Home free of loose throw rugs in walkways, pet beds, electrical cords, etc?: (Patient-Rptd) Yes Adequate lighting in your home to reduce risk of falls?: (Patient-Rptd) Yes Life alert?: (Patient-Rptd) No Use of a cane, walker or w/c?: (Patient-Rptd) Yes Grab bars in the bathroom?: (Patient-Rptd) Yes Shower chair or bench in shower?: (Patient-Rptd) Yes Elevated toilet seat or a handicapped toilet?: (Patient-Rptd) No  TIMED UP AND GO:  Was the test performed?  No    Cognitive Function:        08/30/2023   11:50 AM 08/25/2022   12:56 PM 08/19/2021    1:39 PM 08/13/2020   11:58 AM  6CIT Screen  What Year? 0 points 0 points 0 points 0 points  What month? 0 points 0 points 0 points 0 points  What time? 0 points 0 points 0 points   Count back from 20 0 points 0 points 0 points 0 points  Months in reverse 0 points 0 points 0 points 0 points  Repeat phrase 0 points 0 points 2 points 0 points  Total Score 0 points 0 points 2 points     Immunizations Immunization History  Administered Date(s) Administered   Influenza, Seasonal, Injecte, Preservative Fre 05/31/2023   Influenza,inj,Quad PF,6+ Mos 05/11/2017, 05/03/2019, 05/28/2020, 07/10/2021   Influenza-Unspecified 06/01/2018, 05/26/2022   Moderna Covid-19 Fall Seasonal Vaccine 32yrs & older 06/28/2023   Moderna SARS-COV2 Booster Vaccination 12/10/2020   Moderna Sars-Covid-2 Vaccination 12/05/2019, 01/02/2020,  06/26/2020   Pneumococcal Conjugate-13 01/10/2018   Pneumococcal Polysaccharide-23 04/10/2019   Zoster Recombinant(Shingrix) 06/28/2019, 09/05/2019    TDAP status: Due, Education has been provided regarding the importance of this vaccine. Advised may receive this vaccine at local pharmacy or Health Dept. Aware to provide a copy of the vaccination record if obtained from local pharmacy or Health Dept. Verbalized acceptance and understanding.  Flu Vaccine status: Up to date    Covid-19 vaccine status: Declined, Education has been provided regarding the importance of this vaccine but patient still declined. Advised may receive this vaccine at local pharmacy or Health Dept.or vaccine clinic. Aware to provide a copy of the vaccination record if obtained from local pharmacy or Health Dept. Verbalized acceptance and understanding.  Qualifies for Shingles Vaccine? Yes   Zostavax completed Yes   Shingrix Completed?: Yes  Screening Tests Health Maintenance  Topic Date Due   DTaP/Tdap/Td (1 - Tdap) Never done   COVID-19 Vaccine (5 - 2024-25 season) 08/23/2023   Medicare Annual Wellness (AWV)  08/29/2024   Colonoscopy  04/03/2032   INFLUENZA VACCINE  Completed   Hepatitis C Screening  Completed   HIV Screening  Completed  Zoster Vaccines- Shingrix  Completed   HPV VACCINES  Aged Out    Health Maintenance  Health Maintenance Due  Topic Date Due   DTaP/Tdap/Td (1 - Tdap) Never done   COVID-19 Vaccine (5 - 2024-25 season) 08/23/2023    Colorectal cancer screening: Type of screening: Colonoscopy. Completed 04/03/22. Repeat every 10 years   Additional Screening:  Hepatitis C Screening: does qualify; Completed 09/01/15  Vision Screening: Recommended annual ophthalmology exams for early detection of glaucoma and other disorders of the eye. Is the patient up to date with their annual eye exam?  Yes  Who is the provider or what is the name of the office in which the patient attends annual  eye exams? [facility] If pt is not established with a provider, would they like to be referred to a provider to establish care? No .   Dental Screening: Recommended annual dental exams for proper oral hygiene    Community Resource Referral / Chronic Care Management:  CRR required this visit?  No   CCM required this visit?  No     Plan:     I have personally reviewed and noted the following in the patient's chart:   Medical and social history Use of alcohol, tobacco or illicit drugs  Current medications and supplements including opioid prescriptions. Patient is not currently taking opioid prescriptions. Functional ability and status Nutritional status Physical activity Advanced directives List of other physicians Hospitalizations, surgeries, and ER visits in previous 12 months Vitals Screenings to include cognitive, depression, and falls Referrals and appointments  In addition, I have reviewed and discussed with patient certain preventive protocols, quality metrics, and best practice recommendations. A written personalized care plan for preventive services as well as general preventive health recommendations were provided to patient.     Tillie Rung, LPN   [id number]   After Visit Summary: (MyChart) Due to this being a telephonic visit, the after visit summary with patients personalized plan was offered to patient via MyChart   Nurse Notes: None

## 2023-08-30 NOTE — Patient Instructions (Addendum)
Gregory [name] , Thank you for taking time to come for your Medicare Wellness Visit. I appreciate your ongoing commitment to your health goals. Please review the following plan we discussed and let me know if I can assist you in the future.   Referrals/Orders/Follow-Ups/Clinician Recommendations:   This is a list of the screening recommended for you and due dates:  Health Maintenance  Topic Date Due   DTaP/Tdap/Td vaccine (1 - Tdap) Never done   COVID-19 Vaccine (5 - 2024-25 season) 08/23/2023   Medicare Annual Wellness Visit  08/29/2024   Colon Cancer Screening  04/03/2032   Flu Shot  Completed   Hepatitis C Screening  Completed   HIV Screening  Completed   Zoster (Shingles) Vaccine  Completed   HPV Vaccine  Aged Out    Advanced directives: (Copy Requested) Please bring a copy of your health care power of attorney and living will to the office to be added to your chart at your convenience.  Next Medicare Annual Wellness Visit scheduled for next year: Yes

## 2023-08-31 ENCOUNTER — Other Ambulatory Visit (HOSPITAL_COMMUNITY): Payer: Self-pay

## 2023-08-31 ENCOUNTER — Encounter (HOSPITAL_COMMUNITY): Payer: Self-pay

## 2023-08-31 NOTE — Progress Notes (Signed)
 Specialty Pharmacy Refill Coordination Note  Gregory D Dierks Jr. is a 62 y.o. male contacted today regarding refills of specialty medication(s) Upadacitinib  (Rinvoq )   Patient requested Delivery   Delivery date: 09/07/23   Verified address: [address] [place] [address]   Medication will be filled on 09/06/23.

## 2023-08-31 NOTE — Progress Notes (Signed)
 Specialty Pharmacy Ongoing Clinical Assessment Note  Gregory D Schalk Jr. is a 62 y.o. male who is being followed by the specialty pharmacy service for RxSp Crohn's Disease   Patient's specialty medication(s) reviewed today: Upadacitinib  (Rinvoq )   Missed doses in the last 4 weeks: 0   Patient/Caregiver did not have any additional questions or concerns.   Therapeutic benefit summary: Patient is achieving benefit   Adverse events/side effects summary: No adverse events/side effects   Patient's therapy is appropriate to: Continue    Goals Addressed             This Visit's Progress    Stabilization of disease       Patient is on track. Patient will maintain adherence.  Patient reports that his Crohn's disease is well-controlled at this time.          Follow up:  6 months  Silvano LOISE Dolly Specialty Pharmacist

## 2023-09-09 ENCOUNTER — Inpatient Hospital Stay: Payer: 59 | Attending: Hematology and Oncology

## 2023-09-09 DIAGNOSIS — E538 Deficiency of other specified B group vitamins: Secondary | ICD-10-CM | POA: Diagnosis not present

## 2023-09-09 DIAGNOSIS — D539 Nutritional anemia, unspecified: Secondary | ICD-10-CM

## 2023-09-09 DIAGNOSIS — D509 Iron deficiency anemia, unspecified: Secondary | ICD-10-CM | POA: Diagnosis not present

## 2023-09-09 DIAGNOSIS — E610 Copper deficiency: Secondary | ICD-10-CM

## 2023-09-09 DIAGNOSIS — E46 Unspecified protein-calorie malnutrition: Secondary | ICD-10-CM | POA: Diagnosis not present

## 2023-09-09 DIAGNOSIS — Z79899 Other long term (current) drug therapy: Secondary | ICD-10-CM | POA: Insufficient documentation

## 2023-09-09 LAB — CMP (CANCER CENTER ONLY)
ALT: 9 U/L (ref 0–44)
AST: 16 U/L (ref 15–41)
Albumin: 3.2 g/dL — ABNORMAL LOW (ref 3.5–5.0)
Alkaline Phosphatase: 77 U/L (ref 38–126)
Anion gap: 5 (ref 5–15)
BUN: 7 mg/dL — ABNORMAL LOW (ref 8–23)
CO2: 25 mmol/L (ref 22–32)
Calcium: 7.6 mg/dL — ABNORMAL LOW (ref 8.9–10.3)
Chloride: 110 mmol/L (ref 98–111)
Creatinine: 1.32 mg/dL — ABNORMAL HIGH (ref 0.61–1.24)
GFR, Estimated: >60 mL/min (ref >60)
Glucose, Bld: 87 mg/dL (ref 70–99)
Potassium: 3.6 mmol/L (ref 3.5–5.1)
Sodium: 140 mmol/L (ref 135–145)
Total Bilirubin: 1.2 mg/dL (ref 0.0–1.2)
Total Protein: 5.9 g/dL — ABNORMAL LOW (ref 6.5–8.1)

## 2023-09-09 LAB — CBC WITH DIFFERENTIAL (CANCER CENTER ONLY)
Abs Immature Granulocytes: 0.07 10*3/uL (ref 0.00–0.07)
Basophils Absolute: 0 10*3/uL (ref 0.0–0.1)
Basophils Relative: 0 %
Eosinophils Absolute: 0.1 10*3/uL (ref 0.0–0.5)
Eosinophils Relative: 1 %
HCT: 42.3 % (ref 39.0–52.0)
Hemoglobin: 13.6 g/dL (ref 13.0–17.0)
Immature Granulocytes: 1 %
Lymphocytes Relative: 12 %
Lymphs Abs: 1.3 10*3/uL (ref 0.7–4.0)
MCH: 30 pg (ref 26.0–34.0)
MCHC: 32.2 g/dL (ref 30.0–36.0)
MCV: 93.2 fL (ref 80.0–100.0)
Monocytes Absolute: 1.3 10*3/uL — ABNORMAL HIGH (ref 0.1–1.0)
Monocytes Relative: 12 %
Neutro Abs: 7.9 10*3/uL — ABNORMAL HIGH (ref 1.7–7.7)
Neutrophils Relative %: 74 %
Platelet Count: 287 10*3/uL (ref 150–400)
RBC: 4.54 MIL/uL (ref 4.22–5.81)
RDW: 13.5 % (ref 11.5–15.5)
WBC Count: 10.8 10*3/uL — ABNORMAL HIGH (ref 4.0–10.5)
nRBC: 0 % (ref 0.0–0.2)

## 2023-09-09 LAB — IRON AND IRON BINDING CAPACITY (CC-WL,HP ONLY)
Iron: 49 ug/dL (ref 45–182)
Saturation Ratios: 15 % — ABNORMAL LOW (ref 17.9–39.5)
TIBC: 330 ug/dL (ref 250–450)
UIBC: 281 ug/dL (ref 117–376)

## 2023-09-09 LAB — VITAMIN B12: Vitamin B-12: 686 pg/mL (ref 180–914)

## 2023-09-09 LAB — FERRITIN: Ferritin: 40 ng/mL (ref 24–336)

## 2023-09-10 LAB — COPPER, SERUM: Copper: 67 ug/dL — ABNORMAL LOW (ref 69–132)

## 2023-09-14 DIAGNOSIS — Z933 Colostomy status: Secondary | ICD-10-CM | POA: Diagnosis not present

## 2023-09-16 ENCOUNTER — Encounter: Payer: Self-pay | Admitting: Hematology and Oncology

## 2023-09-16 ENCOUNTER — Other Ambulatory Visit: Payer: Self-pay

## 2023-09-16 ENCOUNTER — Inpatient Hospital Stay: Payer: 59 | Admitting: Hematology and Oncology

## 2023-09-16 VITALS — BP 115/72 | HR 75 | Temp 98.7°F | Resp 18 | Ht 71.0 in | Wt 139.2 lb

## 2023-09-16 DIAGNOSIS — D509 Iron deficiency anemia, unspecified: Secondary | ICD-10-CM | POA: Diagnosis not present

## 2023-09-16 DIAGNOSIS — E610 Copper deficiency: Secondary | ICD-10-CM | POA: Diagnosis not present

## 2023-09-16 DIAGNOSIS — E43 Unspecified severe protein-calorie malnutrition: Secondary | ICD-10-CM

## 2023-09-16 DIAGNOSIS — E559 Vitamin D deficiency, unspecified: Secondary | ICD-10-CM

## 2023-09-16 DIAGNOSIS — E538 Deficiency of other specified B group vitamins: Secondary | ICD-10-CM | POA: Diagnosis not present

## 2023-09-16 DIAGNOSIS — D539 Nutritional anemia, unspecified: Secondary | ICD-10-CM | POA: Diagnosis not present

## 2023-09-16 DIAGNOSIS — K50818 Crohn's disease of both small and large intestine with other complication: Secondary | ICD-10-CM

## 2023-09-16 DIAGNOSIS — E46 Unspecified protein-calorie malnutrition: Secondary | ICD-10-CM | POA: Diagnosis not present

## 2023-09-16 DIAGNOSIS — Z79899 Other long term (current) drug therapy: Secondary | ICD-10-CM | POA: Diagnosis not present

## 2023-09-16 NOTE — Progress Notes (Signed)
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 He had history of vitamin B12 deficiency, borderline iron deficiency and copper deficiencies He also noted to have borderline chronic kidney disease that could be contributing to anemia chronic illness With aggressive dietary modification and additional mineral supplementation, he is no longer anemic I plan to recommend continue nutritional supplementation with oral supplements and I plan to see him again in 6 months for further follow-up  Protein calorie malnutrition (HCC) The patient increase his dietary protein intake and is able to improve his serum protein and albumin I recommend the patient to continue increased oral intake as tolerated He has gained some weight since last time I saw him  Hypocalcemia Recommend calcium carbonate as tolerated  Orders Placed This Encounter  Procedures   CBC with Differential (Cancer Center Only)    Standing Status:   Future    Expiration Date:   09/15/2024   Ferritin    Standing Status:   Future    Expiration Date:   09/15/2024   Iron and Iron Binding Capacity ([facility] only)    Standing Status:   Future    Expiration Date:   09/15/2024   VITAMIN D 25 Hydroxy (Vit-D Deficiency, Fractures)    Standing Status:   Future    Expiration Date:   09/15/2024   Vitamin B12    Standing Status:   Future    Expiration Date:   09/15/2024   Copper, serum    Standing Status:   Future    Expiration Date:   09/15/2024   CMP (Cancer Center only)    Standing Status:   Future    Expiration Date:   09/15/2024    The total time spent in the appointment was 20 minutes encounter with patients including review of chart and various tests results, discussions about plan of care and coordination of care plan   All questions were answered. The patient knows to call the clinic with any problems, questions or concerns. No  barriers to learning was detected.    Artis Delay, MD 1/16/20251:28 PM  INTERVAL HISTORY: Gregory [name]. 63 y.o. male returns for review test results When I saw the patient in 2024, the patient was noted to have multifactorial anemia He is instructed to take multiple supplements He felt better since he started copper supplement He was also instructed to increase oral intake and he is eating more food with higher protein and he tolerated that well We discussed test results The patient is pleased to see significant progress made since last time I saw him  SUMMARY OF HEMATOLOGIC HISTORY:  He was found to have abnormal CBC from recent blood draw The patient have extensive surgeries in the past secondary to Crohn's disease.  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  He also have history of endocarditis and kidney stones He was found to have microcytic anemia from blood draw He has frequent blood draw due to significant trace mineral deficiencies with confirm vitamin D deficiency and vitamin B12 deficiencies  He denies recent chest pain on exertion, shortness of breath on minimal exertion, pre-syncopal episodes, or palpitations. He had not noticed any recent bleeding such as epistaxis, hematuria or hematochezia The patient denies over the counter NSAID ingestion. He is not on antiplatelets agents. His last colonoscopy on August 2023 showed diffuse ileitis and inflammation He  had no prior history or diagnosis of cancer. His age appropriate screening programs are up-to-date. He denies any pica and eats a variety of diet. He never donated blood  The patient was prescribed oral iron supplements and he takes one daily. He has lost some weight from peak weight of around 142 pounds but he is gaining some weight back Blood work from 2024 revealed multiple mineral deficiencies including copper deficiency.  The patient started to take copper supplement  and increase oral intake  I have reviewed the past medical history, past surgical history, social history and family history with the patient and they are unchanged from previous note.  ALLERGIES:  is allergic to methotrexate derivatives, phenergan [promethazine hcl], humira [adalimumab], penicillins, and remicade [infliximab].  MEDICATIONS:  Current Outpatient Medications  Medication Sig Dispense Refill   calcium carbonate (TUMS - DOSED IN MG ELEMENTAL CALCIUM) 500 MG chewable tablet Chew 1 tablet by mouth 2 (two) times daily.     COPPER PO Take 4 mg by mouth daily.     ferrous sulfate 325 (65 FE) MG EC tablet Take 325 mg by mouth daily with breakfast.     acetaminophen (TYLENOL) 500 MG tablet Take 500 mg by mouth every 6 (six) hours as needed for moderate pain.     cyanocobalamin (VITAMIN B12) 1000 MCG/ML injection Inject 1 mL (1,000 mcg total) into the muscle every 30 (thirty) days. 3 mL 1   dicyclomine (BENTYL) 10 MG capsule Take 1 capsule (10 mg total) by mouth every 8 (eight) hours as needed (abdominal cramping). 30 capsule 1   diphenoxylate-atropine (LOMOTIL) 2.5-0.025 MG tablet TAKE 1 TABLET 4 TIMES A DAY AS NEEDED FOR DIARRHEA OR LOOSE STOOLS 120 tablet 3   Multiple Vitamin (MULTIVITAMIN WITH MINERALS) TABS tablet Take 1 tablet by mouth daily. 30 tablet 3   omeprazole (PRILOSEC) 20 MG capsule TAKE ONE CAPSULE ONCE DAILY 90 capsule 1   ondansetron (ZOFRAN) 4 MG tablet TAKE 1 TABLET EVERY 8 HOURS AS NEEDED FOR NASUEA OR VOMITING 30 tablet 1   potassium chloride (KLOR-CON) 20 MEQ packet Take 20 mEq by mouth daily. 90 each 1   sertraline (ZOLOFT) 50 MG tablet Take 1 tablet (50 mg total) by mouth daily. 90 tablet 1   sildenafil (VIAGRA) 25 MG tablet Take 0.5 tablets (12.5 mg total) by mouth daily as needed for erectile dysfunction. 10 tablet 4   Upadacitinib ER (RINVOQ) 30 MG TB24 Take 1 tablet by mouth daily. 90 tablet 3   Vitamin D, Ergocalciferol, (DRISDOL) 1.25 MG (50000 UNIT) CAPS  capsule Take 1 capsule (50,000 Units total) by mouth once a week. 12 capsule 3   No current facility-administered medications for this visit.     REVIEW OF SYSTEMS:   Constitutional: Denies fevers, chills or night sweats Eyes: Denies blurriness of vision Ears, nose, mouth, throat, and face: Denies mucositis or sore throat Respiratory: Denies cough, dyspnea or wheezes Cardiovascular: Denies palpitation, chest discomfort or lower extremity swelling Gastrointestinal:  Denies nausea, heartburn or change in bowel habits Skin: Denies abnormal skin rashes Lymphatics: Denies new lymphadenopathy or easy bruising Neurological:Denies numbness, tingling or new weaknesses Behavioral/Psych: Mood is stable, no new changes  All other systems were reviewed with the patient and are negative.  PHYSICAL EXAMINATION: ECOG PERFORMANCE STATUS: 0 - Asymptomatic  Vitals:   09/16/23 1307  BP: 115/72  Pulse: 75  Resp: 18  Temp: 98.7 F (37.1 C)  SpO2: 99%   Filed Weights   09/16/23 1307  Weight: 139  lb 3.2 oz (63.1 kg)    GENERAL:alert, no distress and comfortable  LABORATORY DATA:  I have reviewed the data as listed     Component Value Date/Time   NA 140 09/09/2023 1241   K 3.6 09/09/2023 1241   CL 110 09/09/2023 1241   CO2 25 09/09/2023 1241   GLUCOSE 87 09/09/2023 1241   BUN 7 (L) 09/09/2023 1241   CREATININE 1.32 (H) 09/09/2023 1241   CREATININE 1.41 (H) 12/01/2021 1401   CALCIUM 7.6 (L) 09/09/2023 1241   PROT 5.9 (L) 09/09/2023 1241   ALBUMIN 3.2 (L) 09/09/2023 1241   AST 16 09/09/2023 1241   ALT 9 09/09/2023 1241   ALKPHOS 77 09/09/2023 1241   BILITOT 1.2 09/09/2023 1241   GFRNONAA >60 09/09/2023 1241   GFRAA 49 (L) 12/22/2019 0459    No results found for: "SPEP", "UPEP"  Lab Results  Component Value Date   WBC 10.8 (H) 09/09/2023   NEUTROABS 7.9 (H) 09/09/2023   HGB 13.6 09/09/2023   HCT 42.3 09/09/2023   MCV 93.2 09/09/2023   PLT 287 09/09/2023      Chemistry       Component Value Date/Time   NA 140 09/09/2023 1241   K 3.6 09/09/2023 1241   CL 110 09/09/2023 1241   CO2 25 09/09/2023 1241   BUN 7 (L) 09/09/2023 1241   CREATININE 1.32 (H) 09/09/2023 1241   CREATININE 1.41 (H) 12/01/2021 1401      Component Value Date/Time   CALCIUM 7.6 (L) 09/09/2023 1241   ALKPHOS 77 09/09/2023 1241   AST 16 09/09/2023 1241   ALT 9 09/09/2023 1241   BILITOT 1.2 09/09/2023 1241

## 2023-09-16 NOTE — Assessment & Plan Note (Signed)
Cause of his anemia is multifactorial He had history of vitamin B12 deficiency, borderline iron deficiency and copper deficiencies He also noted to have borderline chronic kidney disease that could be contributing to anemia chronic illness With aggressive dietary modification and additional mineral supplementation, he is no longer anemic I plan to recommend continue nutritional supplementation with oral supplements and I plan to see him again in 6 months for further follow-up

## 2023-09-16 NOTE — Assessment & Plan Note (Signed)
The patient increase his dietary protein intake and is able to improve his serum protein and albumin I recommend the patient to continue increased oral intake as tolerated He has gained some weight since last time I saw him

## 2023-09-16 NOTE — Assessment & Plan Note (Signed)
Recommend calcium carbonate as tolerated

## 2023-09-28 ENCOUNTER — Other Ambulatory Visit (HOSPITAL_COMMUNITY): Payer: Self-pay

## 2023-09-28 ENCOUNTER — Other Ambulatory Visit (HOSPITAL_COMMUNITY): Payer: Self-pay | Admitting: Pharmacy Technician

## 2023-09-28 NOTE — Progress Notes (Signed)
Specialty Pharmacy Refill Coordination Note  Gregory [name]. is a 63 y.o. male contacted today regarding refills of specialty medication(s) Upadacitinib (Rinvoq)   Patient requested Delivery   Delivery date: 10/05/23   Verified address: [address]  [place]   Medication will be filled on 10/04/23.

## 2023-10-14 DIAGNOSIS — Z933 Colostomy status: Secondary | ICD-10-CM | POA: Diagnosis not present

## 2023-10-22 ENCOUNTER — Other Ambulatory Visit: Payer: Self-pay | Admitting: Gastroenterology

## 2023-10-28 ENCOUNTER — Other Ambulatory Visit (HOSPITAL_COMMUNITY): Payer: Self-pay

## 2023-10-28 ENCOUNTER — Other Ambulatory Visit: Payer: Self-pay

## 2023-10-28 NOTE — Progress Notes (Signed)
 Specialty Pharmacy Refill Coordination Note  Gregory [name]. is a 63 y.o. male contacted today regarding refills of specialty medication(s) Upadacitinib (Rinvoq)   Patient requested Delivery   Delivery date: 11/04/23   Verified address: [address]   [place] [address]   Medication will be filled on 11/03/23.

## 2023-11-15 DIAGNOSIS — Z933 Colostomy status: Secondary | ICD-10-CM | POA: Diagnosis not present

## 2023-11-24 ENCOUNTER — Other Ambulatory Visit: Payer: Self-pay | Admitting: Gastroenterology

## 2023-11-25 ENCOUNTER — Other Ambulatory Visit: Payer: Self-pay

## 2023-11-29 ENCOUNTER — Other Ambulatory Visit: Payer: Self-pay

## 2023-11-29 ENCOUNTER — Other Ambulatory Visit (HOSPITAL_COMMUNITY): Payer: Self-pay

## 2023-11-29 NOTE — Progress Notes (Signed)
 Specialty Pharmacy Refill Coordination Note  Gregory [name]. is a 63 y.o. male contacted today regarding refills of specialty medication(s) Upadacitinib (Rinvoq)   Patient requested Delivery   Delivery date: 12/02/23   Verified address: [address]   [place] [address]   Medication will be filled on 12/01/23.

## 2023-12-01 ENCOUNTER — Other Ambulatory Visit: Payer: Self-pay

## 2023-12-16 DIAGNOSIS — Z933 Colostomy status: Secondary | ICD-10-CM | POA: Diagnosis not present

## 2023-12-27 ENCOUNTER — Other Ambulatory Visit: Payer: Self-pay | Admitting: Gastroenterology

## 2023-12-27 ENCOUNTER — Other Ambulatory Visit: Payer: Self-pay | Admitting: Pharmacy Technician

## 2023-12-27 ENCOUNTER — Other Ambulatory Visit (HOSPITAL_COMMUNITY): Payer: Self-pay

## 2023-12-27 ENCOUNTER — Other Ambulatory Visit: Payer: Self-pay

## 2023-12-27 MED ORDER — RINVOQ 30 MG PO TB24
1.0000 | ORAL_TABLET | Freq: Every day | ORAL | 0 refills | Status: DC
  Filled 2023-12-27: qty 30, 30d supply, fill #0
  Filled 2024-01-26: qty 30, 30d supply, fill #1
  Filled 2024-02-25: qty 30, 30d supply, fill #2

## 2023-12-27 NOTE — Progress Notes (Signed)
 Specialty Pharmacy Refill Coordination Note  Gregory D Hiltner Jr. is a 63 y.o. male contacted today regarding refills of specialty medication(s) Upadacitinib  (Rinvoq )   Patient requested Delivery   Delivery date: 12/31/23   Verified address: [address] [place] [address]   Medication will be filled on 12/30/23.   This fill date is pending response to refill request from provider. Patient is aware and if they have not received fill by intended date they must follow up with pharmacy.

## 2023-12-30 ENCOUNTER — Other Ambulatory Visit: Payer: Self-pay

## 2024-01-01 ENCOUNTER — Other Ambulatory Visit: Payer: Self-pay | Admitting: Gastroenterology

## 2024-01-04 ENCOUNTER — Other Ambulatory Visit: Payer: Self-pay | Admitting: Gastroenterology

## 2024-01-17 DIAGNOSIS — Z933 Colostomy status: Secondary | ICD-10-CM | POA: Diagnosis not present

## 2024-01-26 ENCOUNTER — Other Ambulatory Visit: Payer: Self-pay | Admitting: Pharmacy Technician

## 2024-01-26 ENCOUNTER — Other Ambulatory Visit: Payer: Self-pay | Admitting: Gastroenterology

## 2024-01-26 ENCOUNTER — Telehealth: Payer: Self-pay | Admitting: Gastroenterology

## 2024-01-26 ENCOUNTER — Other Ambulatory Visit: Payer: Self-pay

## 2024-01-26 NOTE — Telephone Encounter (Signed)
 Patient has upcoming appointment on 7-8.  Refills sent to get to appt

## 2024-01-26 NOTE — Telephone Encounter (Signed)
 Patient requesting medication refill for lomotil  and omeprazole . Please advise.   Thank you

## 2024-01-26 NOTE — Progress Notes (Signed)
 Specialty Pharmacy Refill Coordination Note  Gregory D Simonetti Jr. is a 63 y.o. male contacted today regarding refills of specialty medication(s)   Upadacitinib  (Rinvoq )    Patient requested (Patient-Rptd) Delivery   Delivery date: (Patient-Rptd) 01/20/24 New delivery Date 01/31/24   Verified address: (Patient-Rptd) [address] [place] [address]   Medication will be filled on 01/28/24.  Patient called back & want deilver for June 2nd 2025

## 2024-02-02 ENCOUNTER — Other Ambulatory Visit: Payer: Self-pay

## 2024-02-02 ENCOUNTER — Other Ambulatory Visit (HOSPITAL_COMMUNITY): Payer: Self-pay

## 2024-02-16 ENCOUNTER — Other Ambulatory Visit: Payer: Self-pay

## 2024-02-16 DIAGNOSIS — Z933 Colostomy status: Secondary | ICD-10-CM | POA: Diagnosis not present

## 2024-02-24 ENCOUNTER — Encounter (INDEPENDENT_AMBULATORY_CARE_PROVIDER_SITE_OTHER): Payer: Self-pay

## 2024-02-25 ENCOUNTER — Other Ambulatory Visit: Payer: Self-pay

## 2024-02-25 NOTE — Progress Notes (Addendum)
 Specialty Pharmacy Refill Coordination Note  Gregory D Iacovelli Jr. is a 63 y.o. male contacted today regarding refills of specialty medication(s) Upadacitinib  (Rinvoq )   Patient requested (Patient-Rptd) Delivery   Delivery date: 02/29/24   Verified address: (Patient-Rptd) [address] [place] [address]   Medication will be filled on 06.30.25.

## 2024-03-01 MED ORDER — OMEPRAZOLE 20 MG PO CPDR: 20.0000 mg | DELAYED_RELEASE_CAPSULE | Freq: Every day | ORAL | 0 refills | Status: DC

## 2024-03-01 NOTE — Telephone Encounter (Signed)
 Returned patients call , I sent in #30 No Refills of Omeprazole  to his pharmacy but explained the importance of him keeping his appointment with Dr Leigh on July 8.

## 2024-03-01 NOTE — Addendum Note (Signed)
 Addended by: BETTIE GRAYCE HERO on: 03/01/2024 11:12 AM   Modules accepted: Orders

## 2024-03-01 NOTE — Telephone Encounter (Signed)
 Patient called and stated that stated he is really needing a refill on his omeprazole  due to him having severe GERD. Patient is requesting to call back to advise. Please advise.

## 2024-03-07 ENCOUNTER — Other Ambulatory Visit

## 2024-03-07 ENCOUNTER — Encounter: Payer: Self-pay | Admitting: Gastroenterology

## 2024-03-07 ENCOUNTER — Ambulatory Visit (INDEPENDENT_AMBULATORY_CARE_PROVIDER_SITE_OTHER): Admitting: Gastroenterology

## 2024-03-07 ENCOUNTER — Other Ambulatory Visit: Payer: Self-pay

## 2024-03-07 VITALS — HR 75 | Ht 71.0 in | Wt 127.0 lb

## 2024-03-07 DIAGNOSIS — M810 Age-related osteoporosis without current pathological fracture: Secondary | ICD-10-CM

## 2024-03-07 DIAGNOSIS — Z5181 Encounter for therapeutic drug level monitoring: Secondary | ICD-10-CM

## 2024-03-07 DIAGNOSIS — K5080 Crohn's disease of both small and large intestine without complications: Secondary | ICD-10-CM

## 2024-03-07 DIAGNOSIS — D649 Anemia, unspecified: Secondary | ICD-10-CM | POA: Diagnosis not present

## 2024-03-07 DIAGNOSIS — Z79899 Other long term (current) drug therapy: Secondary | ICD-10-CM

## 2024-03-07 MED ORDER — VITAMIN D (ERGOCALCIFEROL) 1.25 MG (50000 UNIT) PO CAPS: 50000.0000 [IU] | ORAL_CAPSULE | ORAL | 3 refills | Status: DC

## 2024-03-07 NOTE — Patient Instructions (Addendum)
 Continue Rinvoq .  Please go to the lab in the basement of our building to have lab work done as you leave today. Hit B for basement when you get on the elevator.  When the doors open the lab is on your left.  We will call you with the results. Thank you.  We have sent the following medications to your pharmacy for you to pick up at your convenience: Vitamin D   We are referring you to Endocrinology regarding Osteoporosis.  They will contact you directly to schedule an appointment.  It may take a week or more before you hear from them.  Please feel free to contact us  if you have not heard from them within 2 weeks and we will follow up on the referral.   Thank you for entrusting me with your care and for choosing [facility], Dr. Elspeth Naval    If your blood pressure at your visit was 140/90 or greater, please contact your primary care physician to follow up on this. ______________________________________________________  If you are age 37 or older, your body mass index should be between 23-30. Your Body mass index is 17.71 kg/m. If this is out of the aforementioned range listed, please consider follow up with your Primary Care Provider.  If you are age 72 or younger, your body mass index should be between 19-25. Your Body mass index is 17.71 kg/m. If this is out of the aformentioned range listed, please consider follow up with your Primary Care Provider.  ________________________________________________________  The [facility] providers would like to encourage you to use MYCHART to communicate with providers for non-urgent requests or questions.  Due to long hold times on the telephone, sending your provider a message by [facility] may be a faster and more efficient way to get a response.  Please allow 48 business hours for a response.  Please remember that this is for non-urgent requests.  _______________________________________________________  Due to recent changes in  healthcare laws, you may see the results of your imaging and laboratory studies on MyChart before your provider has had a chance to review them.  We understand that in some cases there may be results that are confusing or concerning to you. Not all laboratory results come back in the same time frame and the provider may be waiting for multiple results in order to interpret others.  Please give us  48 hours in order for your provider to thoroughly review all the results before contacting the office for clarification of your results.

## 2024-03-07 NOTE — Progress Notes (Signed)
 HPI :  Crohn's history Diagnosed with Crohn's disease in 49s. 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 He has a colostomy with a remnant rectal pouch. He reports he has had a bowel surgery in the 1982, with a few more surgeries in years after while in [place], as well as in [facility], they tried reversing his colostomy which did not work well for him, too much diarrhea and it was then reversed back. He has a history of abscess associated with small bowel.    He has been hospitalized multiple times. He previously had been on chronic prednisone  for 10 years with fluctuating levels. He was on Cimzia for about 4-5 years in the past.  He has been on sulfasalazine for a long time. He reports an allergy to - he does not know the details of the reaction. He had an allergic reaction - hives, to both Remicade and Humira. Allergy to methotrexate . Failed therapeutic Entyvio , then on Stelara  March 2019, with dosing increase to once monthly. Initially did well with Stelara  but ultimately failed. Colonoscopy 2023 showed ongoing active disease despite high dose Stelara , changed to Rinvoq  August 2023.      SINCE LAST VISIT   63 year old male here for follow-up visit for his Crohn's disease.  I have not seen him since June 2024.  Recall he has been on Rinvoq  since August 2023.  He has been on maintenance therapy since then and continues to say that this is the best he has felt in some time on a Crohn's medication.  He has not really had any flares of his symptoms that has led him to contact the office.  He does have some chronic loose stools at baseline.  He states in the summertime he needs to make sure he is drinking enough fluid and staying hydrated but denies any blood in his stools, no abdominal pains.  He thinks he has gained a little bit of weight.  Recall we did a fecal calprotectin last year and his value was elevated to 615.  We had discussed  doing an enterography study but he did not wish to pursue it.  He also had an anemia with significant MCV elevation.  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  He has been taking his vitamin supplements and his last hemoglobin in January showed normalization of his hemoglobin to 13.6 with MCV of 93.  He states he is due for follow-up blood work with Dr. Lonn in the near future, but is going to her office next week for labs.  Recall I also performed a DEXA scan for him last year which showed that he had osteoporosis with a T-score of -3.0.  He has been on vitamin D  supplementation 50,000 units/week and is a compliant with that.  I had offered him previously a referral to endocrinology but he has not seen them.  We discussed his DEXA scan findings and if he was willing to be seen for this.  Otherwise discussed with him if he was willing to do a colonoscopy to reassess his Crohn's disease.  He is really hoping to avoid that right now if he can     Prior evaluation: EGD 03/21/2009 - normal   Colonoscopy 06/11/20 - Localized inflammation, graded as Rutgeerts Score i2 and characterized by shallow ulcerations was found at the surgical anastomosis.  In the distal 10cm of ileum there were scattered small ulcerations with normal intervening mucosa. The small bowel was deeply intubated and mucosa proximal to the distal 10cm of ileum was entirely normal. Scattered medium-mouthed diverticula were found in the entire colon. The exam was otherwise without abnormality. No colonic inflammation. No polyps.   IBD Health Care Maintenance: Annual Flu Vaccine - 2023 UTD Pneumococcal Vaccine if receiving immunosuppression: - PCV 13 01/10/18, PPSV23 04/10/19 Zoster vaccine if over age 82: 06/28/19 TB testing if on anti-TNF, yearly -07/2022 Vitamin D  screening - level > 30 07/09/21 Last  Colonoscopy - 10/23 COVID 19 vaccine UTD 2023   CT 12/19/19 - IMPRESSION: 1. Progressive wall thickening involving a loop of ileum in the left upper and mid pelvis, distal to an anastomosis in the left mid abdomen. This is compatible mildly progressive thickening associated with the patient's known Crohn's disease. A neoplastic process can not be excluded but is less likely. 2. 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 3. Pronounced bullous changes in the inferior left upper lobe.   Colonoscopy 04/03/22: - Diffuse inflammation, graded as Rutgeerts Score i3 (diffuse aphthous ileitis) was found in the terminal ileum and at the surgical anastomosis. - There was evidence of a prior end-to-end ileo-colonic anastomosis in the ascending colon. This was patent and was characterized by inflammation. Inflammation slightly worse than the last exam. - A few small-mouthed diverticula were found in the entire colon. - The exam was otherwise without abnormality. The ostomy was normal. Prep adequate but some residual seeds / nuts which made it difficult to view a small area of the colon in the hepatic flexure     CT scan abdomen / pelvis with contrast 05/06/22: IMPRESSION: 1. Status post right hemicolectomy and ileocolic anastomosis, in addition to sigmoid colon resection and left lower quadrant end colostomy. 2. In comparison to prior examination dated 12/19/2019, very similar appearance of a loop of thickened, narrowed terminal ileum in the midline ventral abdomen measuring approximately 15 cm in length , which is however without evidence of obstruction or overt stricture at this time. Enteric contrast has transited to the left lower quadrant ostomy. Findings are consistent with Crohn's disease and related postoperative findings, with evidence of active inflammation in the terminal ileum.    DEXA 02/19/23: Osteoporosis - t score (-) 3.0 - referred to endocrine  Fecal  calprotectin 02/19/23 - 615   Past Medical History:  Diagnosis Date   Acute endocarditis    Blood transfusion without reported diagnosis    COVID 05/2021   Crohn's disease (HCC)    Depression    Eating disorder    GERD (gastroesophageal reflux disease)    Kidney stones    Osteoporosis    Short gut syndrome      Past Surgical History:  Procedure Laterality Date   COLONOSCOPY  06/11/2020   COLONOSCOPY  07/05/2018   COLONOSCOPY WITH PROPOFOL  N/A 09/17/2017   Procedure: COLONOSCOPY WITH PROPOFOL ;  Surgeon: Shila Gustav GAILS, MD;  Location: [facility];  Service: Endoscopy;  Laterality: N/A;  Through ileostomy.   CYSTOSCOPY WITH STENT PLACEMENT Left 05/20/2017   Procedure: CYSTOSCOPY WITH STENT PLACEMENT;  Surgeon: Cam Morene ORN, MD;  Location: [facility];  Service: Urology;  Laterality: Left;   ILEOSTOMY     IR NEPHROSTOMY EXCHANGE LEFT  04/12/2017   IR NEPHROSTOMY PLACEMENT LEFT  04/12/2017   IR NEPHROSTOMY PLACEMENT LEFT  04/29/2017   NEPHROLITHOTOMY Left 05/14/2017   Procedure: LEFT NEPHROLITHOTOMY PERCUTANEOUS WITH  SURGEON ACCESS;  Surgeon: Cam Morene ORN, MD;  Location: [facility];  Service: Urology;  Laterality: Left;   SMALL INTESTINE SURGERY     SUBTOTAL COLECTOMY     Family History  Problem Relation Age of Onset   Heart disease Mother    Diabetes Father    Heart disease Father    Heart attack Father    Cancer Sister    Heart disease Sister    Melanoma Sister    Anemia Sister    Crohn's disease Brother    Inflammatory bowel disease Brother    Diabetes Paternal Grandmother    Colon cancer Neg Hx    Rectal cancer Neg Hx    Stomach cancer Neg Hx    Esophageal cancer Neg Hx    Social History   Tobacco Use   Smoking status: Former    Current packs/day: 0.00    Average packs/day: 1 pack/day for 20.0 years (20.0 ttl pk-yrs)    Types: Cigarettes    Start date: 08/31/1986    Quit date: 08/31/2006    Years since quitting: 17.5   Smokeless tobacco: Never  Vaping  Use   Vaping status: Never Used  Substance Use Topics   Alcohol use: Yes    Comment: occ   Drug use: No   Current Outpatient Medications  Medication Sig Dispense Refill   acetaminophen  (TYLENOL ) 500 MG tablet Take 500 mg by mouth every 6 (six) hours as needed for moderate pain.     calcium  carbonate (TUMS - DOSED IN MG ELEMENTAL CALCIUM ) 500 MG chewable tablet Chew 1 tablet by mouth 2 (two) times daily.     COPPER  PO Take 4 mg by mouth daily.     cyanocobalamin  (VITAMIN B12) 1000 MCG/ML injection Inject 1 mL (1,000 mcg total) into the muscle every 30 (thirty) days. 3 mL 1   dicyclomine  (BENTYL ) 10 MG capsule Take 1 capsule (10 mg total) by mouth every 8 (eight) hours as needed (abdominal cramping). 30 capsule 1   diphenoxylate -atropine  (LOMOTIL ) 2.5-0.025 MG tablet Take 1 tablet by mouth 4 (four) times daily as needed for diarrhea or loose stools. Please keep your July appointment for further refills. Thank you 120 tablet 0   ferrous sulfate  325 (65 FE) MG EC tablet Take 325 mg by mouth daily with breakfast.     magnesium  (MAGTAB) 84 MG ( ) TBCR SR tablet Take 84 mg by mouth daily.     Multiple Vitamin (MULTIVITAMIN WITH MINERALS) TABS tablet Take 1 tablet by mouth daily. 30 tablet 3   omeprazole  (PRILOSEC) 20 MG capsule Take 1 capsule (20 mg total) by mouth daily. Please keep your July appointment for any further refills. Thank you 30 capsule 0   ondansetron  (ZOFRAN ) 4 MG tablet Take 1 tablet (4 mg total) by mouth every 8 (eight) hours as needed for nausea or vomiting. Office visit for further refills 30 tablet 1   potassium chloride  (KLOR-CON ) 20 MEQ packet Take 20 mEq by mouth daily. 90 each 1   sertraline  (ZOLOFT ) 50 MG tablet Take 1 tablet (50 mg total) by mouth daily. 90 tablet 1   sildenafil  (VIAGRA ) 25 MG tablet Take 0.5 tablets (12.5 mg total) by mouth daily as needed for erectile dysfunction. 10 tablet 4   Upadacitinib  ER (RINVOQ ) 30 MG TB24 Take 1 tablet (30 mg total) by mouth  daily. Please keep your July appointment for more refills. Thank you 90 tablet 0   Vitamin D , Ergocalciferol , (DRISDOL ) 1.25 MG (50000 UNIT) CAPS  capsule Take 1 capsule (50,000 Units total) by mouth once a week. 12 capsule 3   No current facility-administered medications for this visit.   Allergies  Allergen Reactions   Methotrexate  Derivatives Other (See Comments)    Anemia, low WBC, severe GI symptoms,    Phenergan  [Promethazine  Hcl] Other (See Comments)    twitching   Humira [Adalimumab] Other (See Comments)    Intolerance   Penicillins Other (See Comments)    REACTION DURING SURGERY Has patient had a PCN reaction causing immediate rash, facial/tongue/throat swelling, SOB or lightheadedness with hypotension: Unknown Has patient had a PCN reaction causing severe rash involving mucus membranes or skin necrosis: Unknown Has patient had a PCN reaction that required hospitalization: Unknown Has patient had a PCN reaction occurring within the last 10 years: No If all of the above answers are NO, then may proceed with Cephalosporin use.    Remicade [Infliximab] Other (See Comments)    Intolerance     Review of Systems: All systems reviewed and negative except where noted in HPI.   Lab Results  Component Value Date   WBC 10.8 (H) 09/09/2023   HGB 13.6 09/09/2023   HCT 42.3 09/09/2023   MCV 93.2 09/09/2023   PLT 287 09/09/2023   Lab Results  Component Value Date   NA 140 09/09/2023   CL 110 09/09/2023   K 3.6 09/09/2023   CO2 25 09/09/2023   BUN 7 (L) 09/09/2023   CREATININE 1.32 (H) 09/09/2023   GFRNONAA >60 09/09/2023   CALCIUM  7.6 (L) 09/09/2023   PHOS 3.3 12/20/2019   ALBUMIN 3.2 (L) 09/09/2023   GLUCOSE 87 09/09/2023   Lab Results  Component Value Date   ALT 9 09/09/2023   AST 16 09/09/2023   ALKPHOS 77 09/09/2023   BILITOT 1.2 09/09/2023     Physical Exam: Pulse 75   Ht 5' 11 (1.803 m)   Wt 127 lb (57.6 kg)   BMI 17.71 kg/m  Constitutional:  Pleasant,well-developed, male in no acute distress. Neurological: Alert and oriented to person place and time. Psychiatric: Normal mood and affect. Behavior is normal.   ASSESSMENT: 63 y.o. male here for assessment of the following  1. Crohn's disease of both small and large intestine without complication (HCC)   2. High risk medication use   3. Osteoporosis, unspecified osteoporosis type, unspecified pathological fracture presence   4. Anemia, unspecified type    Difficult to control Crohn's disease, has failed numerous regimens in the past as outlined above.  He has been on Rinvoq  for about 2 years now and clinically has not had any flares and feels generally pretty well, likes mode of administration and thinks its worked as well if not better than anything else he has been on.  I am concerned about his fecal calprotectin last year in the light of some of his symptoms I suspect he probably does have some active inflammation.  I asked him if he would be willing to do a colonoscopy to clarify disease activity.  He really wants to avoid that if possible.  Even if he has some active inflammation he states he would prefer to stay on Rinvoq  given he feels subjectively better on it and it has worked better than other regimens in the past, no flares.  I would like him to do a fecal calprotectin to trend this to see how much inflammation he has.  Again he would be very hesitant to switch therapies to something else given failure of multiple  other options in the past.  We discussed that our goal for Crohn's disease is to put him in deep remission however that is not possible in all patients and he has a very refractory case.  If Rinvoq  can continue to keep him out of the hospital, maintain quality of life, okay to continue it even if he has some mild inflammation I just hope it stays mild.  He is agreeable to fecal calprotectin.  We discussed the risks of the therapy and he understands.  He is due for quant  Farren gold, otherwise vaccinations up-to-date.  He is following up with hematology for multifactorial anemia and with copper  supplementation his hemoglobin had normalized.  Hopefully that remains the case.  He does have osteoporosis, on vitamin D  but I would like him to see endocrine for their evaluation about potential treatment.  He is agreeable to this  Follow-up in 6 months if not sooner with issues   PLAN: - continue Rinvoq  - recommended colonoscopy but he wants to do lab for fecal calprotectin, wants to avoid switching therapies if possible given clinical improvement on the regimen - labs with hematology next week - refill vitamin D  - referral to endocrinology for osteoporosis - quantiferon gold to screen for TB - f/u 6 months or sooner if issues   Marcey Naval, MD [facility]

## 2024-03-09 LAB — CALPROTECTIN, FECAL: Calprotectin, Fecal: 843 ug/g — ABNORMAL HIGH (ref 0–120)

## 2024-03-10 ENCOUNTER — Ambulatory Visit: Payer: Self-pay | Admitting: Gastroenterology

## 2024-03-10 ENCOUNTER — Encounter: Payer: Self-pay | Admitting: Gastroenterology

## 2024-03-10 DIAGNOSIS — K5080 Crohn's disease of both small and large intestine without complications: Secondary | ICD-10-CM

## 2024-03-13 MED ORDER — NA SULFATE-K SULFATE-MG SULF 17.5-3.13-1.6 GM/177ML PO SOLN
ORAL | 0 refills | Status: AC
Start: 2024-03-13 — End: ?

## 2024-03-15 ENCOUNTER — Other Ambulatory Visit: Payer: Self-pay

## 2024-03-15 ENCOUNTER — Other Ambulatory Visit: Payer: Self-pay | Admitting: Gastroenterology

## 2024-03-15 ENCOUNTER — Telehealth: Payer: Self-pay | Admitting: Gastroenterology

## 2024-03-15 ENCOUNTER — Inpatient Hospital Stay: Payer: 59 | Attending: Hematology and Oncology

## 2024-03-15 DIAGNOSIS — K50818 Crohn's disease of both small and large intestine with other complication: Secondary | ICD-10-CM

## 2024-03-15 DIAGNOSIS — E559 Vitamin D deficiency, unspecified: Secondary | ICD-10-CM | POA: Diagnosis not present

## 2024-03-15 DIAGNOSIS — E538 Deficiency of other specified B group vitamins: Secondary | ICD-10-CM | POA: Diagnosis not present

## 2024-03-15 DIAGNOSIS — D539 Nutritional anemia, unspecified: Secondary | ICD-10-CM | POA: Insufficient documentation

## 2024-03-15 DIAGNOSIS — K5080 Crohn's disease of both small and large intestine without complications: Secondary | ICD-10-CM | POA: Diagnosis not present

## 2024-03-15 DIAGNOSIS — E43 Unspecified severe protein-calorie malnutrition: Secondary | ICD-10-CM

## 2024-03-15 DIAGNOSIS — E610 Copper deficiency: Secondary | ICD-10-CM

## 2024-03-15 DIAGNOSIS — Z79899 Other long term (current) drug therapy: Secondary | ICD-10-CM

## 2024-03-15 LAB — CBC WITH DIFFERENTIAL (CANCER CENTER ONLY)
Abs Immature Granulocytes: 0.03 K/uL (ref 0.00–0.07)
Basophils Absolute: 0 K/uL (ref 0.0–0.1)
Basophils Relative: 0 %
Eosinophils Absolute: 0.1 K/uL (ref 0.0–0.5)
Eosinophils Relative: 1 %
HCT: 39.6 % (ref 39.0–52.0)
Hemoglobin: 12.9 g/dL — ABNORMAL LOW (ref 13.0–17.0)
Immature Granulocytes: 0 %
Lymphocytes Relative: 16 %
Lymphs Abs: 1.3 K/uL (ref 0.7–4.0)
MCH: 29.8 pg (ref 26.0–34.0)
MCHC: 32.6 g/dL (ref 30.0–36.0)
MCV: 91.5 fL (ref 80.0–100.0)
Monocytes Absolute: 0.6 K/uL (ref 0.1–1.0)
Monocytes Relative: 8 %
Neutro Abs: 6.1 K/uL (ref 1.7–7.7)
Neutrophils Relative %: 75 %
Platelet Count: 332 K/uL (ref 150–400)
RBC: 4.33 MIL/uL (ref 4.22–5.81)
RDW: 14.4 % (ref 11.5–15.5)
WBC Count: 8.2 K/uL (ref 4.0–10.5)
nRBC: 0 % (ref 0.0–0.2)

## 2024-03-15 LAB — CMP (CANCER CENTER ONLY)
ALT: 11 U/L (ref 0–44)
AST: 17 U/L (ref 15–41)
Albumin: 3.6 g/dL (ref 3.5–5.0)
Alkaline Phosphatase: 73 U/L (ref 38–126)
Anion gap: 7 (ref 5–15)
BUN: 12 mg/dL (ref 8–23)
CO2: 26 mmol/L (ref 22–32)
Calcium: 8.7 mg/dL — ABNORMAL LOW (ref 8.9–10.3)
Chloride: 107 mmol/L (ref 98–111)
Creatinine: 1.33 mg/dL — ABNORMAL HIGH (ref 0.61–1.24)
GFR, Estimated: >60 mL/min (ref >60)
Glucose, Bld: 116 mg/dL — ABNORMAL HIGH (ref 70–99)
Potassium: 3.5 mmol/L (ref 3.5–5.1)
Sodium: 140 mmol/L (ref 135–145)
Total Bilirubin: 0.9 mg/dL (ref 0.0–1.2)
Total Protein: 6.6 g/dL (ref 6.5–8.1)

## 2024-03-15 LAB — VITAMIN D 25 HYDROXY (VIT D DEFICIENCY, FRACTURES): Vit D, 25-Hydroxy: 96.86 ng/mL (ref 30–100)

## 2024-03-15 LAB — IRON AND IRON BINDING CAPACITY (CC-WL,HP ONLY)
Iron: 53 ug/dL (ref 45–182)
Saturation Ratios: 15 % — ABNORMAL LOW (ref 17.9–39.5)
TIBC: 358 ug/dL (ref 250–450)
UIBC: 305 ug/dL (ref 117–376)

## 2024-03-15 LAB — VITAMIN B12: Vitamin B-12: 197 pg/mL (ref 180–914)

## 2024-03-15 NOTE — Telephone Encounter (Signed)
 Inbound call from [facility] stating they need the order for QuantiFERON-TB to be faxed to [phone]. Please advise, thank you

## 2024-03-15 NOTE — Telephone Encounter (Signed)
 Order faxed to [facility] at [phone]

## 2024-03-16 ENCOUNTER — Other Ambulatory Visit: Payer: Self-pay | Admitting: Hematology and Oncology

## 2024-03-16 DIAGNOSIS — E538 Deficiency of other specified B group vitamins: Secondary | ICD-10-CM | POA: Insufficient documentation

## 2024-03-16 LAB — FERRITIN: Ferritin: 68 ng/mL (ref 24–336)

## 2024-03-16 LAB — COPPER, SERUM: Copper: 79 ug/dL (ref 69–132)

## 2024-03-17 DIAGNOSIS — Z933 Colostomy status: Secondary | ICD-10-CM | POA: Diagnosis not present

## 2024-03-20 LAB — QUANTIFERON-TB GOLD PLUS
QuantiFERON Mitogen Value: 3.21 [IU]/mL
QuantiFERON Nil Value: 0.08 [IU]/mL
QuantiFERON TB1 Ag Value: 0.12 [IU]/mL
QuantiFERON TB2 Ag Value: 0.13 [IU]/mL
QuantiFERON-TB Gold Plus: NEGATIVE

## 2024-03-23 ENCOUNTER — Encounter: Payer: Self-pay | Admitting: Hematology and Oncology

## 2024-03-23 ENCOUNTER — Inpatient Hospital Stay

## 2024-03-23 ENCOUNTER — Inpatient Hospital Stay (HOSPITAL_BASED_OUTPATIENT_CLINIC_OR_DEPARTMENT_OTHER): Payer: 59 | Admitting: Hematology and Oncology

## 2024-03-23 VITALS — BP 138/85 | HR 67 | Temp 97.4°F | Resp 18 | Ht 71.0 in | Wt 130.8 lb

## 2024-03-23 DIAGNOSIS — E538 Deficiency of other specified B group vitamins: Secondary | ICD-10-CM

## 2024-03-23 DIAGNOSIS — E559 Vitamin D deficiency, unspecified: Secondary | ICD-10-CM

## 2024-03-23 DIAGNOSIS — E60 Dietary zinc deficiency: Secondary | ICD-10-CM | POA: Diagnosis not present

## 2024-03-23 DIAGNOSIS — E610 Copper deficiency: Secondary | ICD-10-CM

## 2024-03-23 DIAGNOSIS — D539 Nutritional anemia, unspecified: Secondary | ICD-10-CM

## 2024-03-23 MED ORDER — CYANOCOBALAMIN 1000 MCG/ML IJ SOLN
1000.0000 ug | Freq: Once | INTRAMUSCULAR | Status: AC
  Administered 2024-03-23: 1000 ug via INTRAMUSCULAR
  Filled 2024-03-23: qty 1

## 2024-03-23 MED ORDER — CYANOCOBALAMIN 1000 MCG/ML IJ SOLN: 1000.0000 ug | INTRAMUSCULAR | 1 refills | Status: DC

## 2024-03-23 NOTE — Assessment & Plan Note (Signed)
 Will check zinc  level

## 2024-03-23 NOTE — Assessment & Plan Note (Addendum)
 I recommend resumption of vitamin B12 injection today and he agreed

## 2024-03-23 NOTE — Assessment & Plan Note (Addendum)
 This has improved He will continue copper  supplement I will also monitor zinc  level in the future

## 2024-03-23 NOTE — Progress Notes (Signed)
 [facility] OFFICE PROGRESS NOTE  Gregory Clotilda SAUNDERS, MD  ASSESSMENT & PLAN:  Assessment & Plan Deficiency anemia Cause of his anemia is multifactorial He had history of vitamin B12 deficiency, borderline iron deficiency and copper  deficiencies He also noted to have borderline chronic kidney disease that could be contributing to anemia chronic illness With aggressive dietary modification and additional mineral supplementation, anemia is almost corrected I plan to recommend continue nutritional supplementation with oral supplements and I plan to see him again in 6 months for further follow-up Vitamin B12 deficiency I recommend resumption of vitamin B12 injection today and he agreed Vitamin D  deficiency His vitamin D  level is borderline high He will discontinue his prescription once it is completed and we will go on vitamin D  over-the-counter supplement at 5000 units Copper  deficiency This has improved He will continue copper  supplement I will also monitor zinc  level in the future Zinc  deficiency Will check zinc  level    Orders Placed This Encounter  Procedures   CBC with Differential ([facility] Only)    Standing Status:   Future    Expiration Date:   03/23/2025   CMP ([facility] only)    Standing Status:   Future    Expiration Date:   03/23/2025   Ferritin    Standing Status:   Future    Expiration Date:   03/23/2025   Iron and Iron Binding Capacity ([facility] only)    Standing Status:   Future    Expiration Date:   03/23/2025   Vitamin B12    Standing Status:   Future    Expiration Date:   03/23/2025   VITAMIN D  25 Hydroxy (Vit-D Deficiency, Fractures)    Standing Status:   Future    Expiration Date:   03/23/2025   TSH    Standing Status:   Future    Expiration Date:   03/23/2025   Zinc     Standing Status:   Future    Expiration Date:   03/23/2025    INTERVAL HISTORY: Patient returns for recurrent anemia Symptoms of anemia includes none We  reviewed multiple pages of test results  SUMMARY OF HEMATOLOGIC HISTORY:  He was found to have abnormal CBC from recent blood draw The patient have extensive surgeries in the past secondary to Crohn's disease.  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  He also have history of endocarditis and kidney stones He was found to have microcytic anemia from blood draw He has frequent blood draw due to significant trace mineral deficiencies with confirm vitamin D  deficiency and vitamin B12 deficiencies  He denies recent chest pain on exertion, shortness of breath on minimal exertion, pre-syncopal episodes, or palpitations. He had not noticed any recent bleeding such as epistaxis, hematuria or hematochezia The patient denies over the counter NSAID ingestion. He is not on antiplatelets agents. His last colonoscopy on August 2023 showed diffuse ileitis and inflammation He had no prior history or diagnosis of cancer. His age appropriate screening programs are up-to-date. He denies any pica and eats a variety of diet. He never donated blood  The patient was prescribed oral iron supplements and he takes one daily. He has lost some weight from peak weight of around 142 pounds but he is gaining some weight back Blood work from 2024 revealed multiple mineral deficiencies including copper  deficiency.  The patient started to take copper  supplement and increase oral intake  Lab Results  Component Value  Date   VITAMINB12 197 03/15/2024   FERRITIN 68 03/15/2024   Vitals:   03/23/24 1348  BP: 138/85  Pulse: 67  Resp: 18  Temp: (!) 97.4 F (36.3 C)  SpO2: 100%

## 2024-03-23 NOTE — Assessment & Plan Note (Addendum)
 His vitamin D  level is borderline high He will discontinue his prescription once it is completed and we will go on vitamin D  over-the-counter supplement at 5000 units

## 2024-03-23 NOTE — Assessment & Plan Note (Addendum)
 Cause of his anemia is multifactorial He had history of vitamin B12 deficiency, borderline iron deficiency and copper  deficiencies He also noted to have borderline chronic kidney disease that could be contributing to anemia chronic illness With aggressive dietary modification and additional mineral supplementation, anemia is almost corrected I plan to recommend continue nutritional supplementation with oral supplements and I plan to see him again in 6 months for further follow-up

## 2024-03-24 ENCOUNTER — Other Ambulatory Visit: Payer: Self-pay

## 2024-03-24 ENCOUNTER — Other Ambulatory Visit: Payer: Self-pay | Admitting: Gastroenterology

## 2024-03-24 ENCOUNTER — Other Ambulatory Visit (HOSPITAL_COMMUNITY): Payer: Self-pay

## 2024-03-24 MED ORDER — RINVOQ 30 MG PO TB24
1.0000 | ORAL_TABLET | Freq: Every day | ORAL | 0 refills | Status: DC
Start: 2024-03-24 — End: 2024-05-15
  Filled 2024-03-24: qty 90, 90d supply, fill #0
  Filled 2024-03-28: qty 30, 30d supply, fill #0
  Filled 2024-04-27: qty 30, 30d supply, fill #1

## 2024-03-28 ENCOUNTER — Other Ambulatory Visit: Payer: Self-pay

## 2024-03-28 ENCOUNTER — Telehealth: Payer: Self-pay | Admitting: Gastroenterology

## 2024-03-28 ENCOUNTER — Other Ambulatory Visit: Payer: Self-pay | Admitting: Gastroenterology

## 2024-03-28 NOTE — Telephone Encounter (Signed)
 Inbound call from [facility] stating they have no received refill for Lomotil  for patient. Advised it looks like it was sent in today. Requesting to be followed up with at [phone]. Please advise, thank you

## 2024-03-28 NOTE — Progress Notes (Signed)
 Specialty Pharmacy Refill Coordination Note  Gregory D Horstman Jr. is a 63 y.o. male contacted today regarding refills of specialty medication(s) Upadacitinib  (Rinvoq )   Patient requested Delivery   Delivery date: 04/03/24   Verified address: [address]  [place] [address]   Medication will be filled on 03/31/24.

## 2024-03-28 NOTE — Telephone Encounter (Signed)
 Called and confirmed pharmacy rec'd the script for Lomotil 

## 2024-04-03 ENCOUNTER — Other Ambulatory Visit: Payer: Self-pay

## 2024-04-24 ENCOUNTER — Telehealth: Payer: Self-pay | Admitting: Gastroenterology

## 2024-04-24 ENCOUNTER — Encounter: Payer: Self-pay | Admitting: Gastroenterology

## 2024-04-24 ENCOUNTER — Other Ambulatory Visit: Payer: Self-pay | Admitting: Gastroenterology

## 2024-04-24 ENCOUNTER — Telehealth: Payer: Self-pay | Admitting: Pharmacy Technician

## 2024-04-24 DIAGNOSIS — K5080 Crohn's disease of both small and large intestine without complications: Secondary | ICD-10-CM

## 2024-04-24 NOTE — Telephone Encounter (Signed)
 I have spoken to patient with Dr Wallene' reply and recommendations. Colonoscopy has been cancelled for now. Patient has scheduled an appointment to see Dr Leigh on 05/15/24.  Dr Leigh, in order to inquire about Tremfya with insurance, I believe we would have a write a script and send to our PA team.

## 2024-04-24 NOTE — Telephone Encounter (Signed)
 Got it, sorry to hear it. Let's cancel the colonoscopy, he can't afford it. Hard to switch biologics without talking more to him about this. He will have limited options given history of multiple drug failures, insurance issues. Any openings to see him in the office in the next 2 weeks or so with me? I am out of the office this week covering the hospital.  Can inquire to his insurance to see if Tremfya would be covered, I would consider switching to that, but would like to see him and discuss options first.

## 2024-04-24 NOTE — Telephone Encounter (Signed)
 Great, thank you. I will see him in the office in a few weeks to discuss further. PLease hold on scheduling him until I can meet with him, just so he knows about the medication, risks, etc. Thank you!

## 2024-04-24 NOTE — Telephone Encounter (Signed)
 Approval for these plans sometimes are really quick.He has [employer] Dual (Medicare + Medicaid combo) It should be covered 100%, but with these plans I can not guarantee that.  He will have to call the insurance and f/u about the cost and if he has met his deductible yet. Unfortunately, I have no way of knowing the cost.  FYI:  We will wait till the SQ dosing is approved before we get him scheduled.

## 2024-04-24 NOTE — Telephone Encounter (Signed)
 Wow that was quick, thanks.  Any way to know how much this will cost for him? Dottie I would still like to discuss it further with him at his visit in a few weeks rather than starting it now. Thanks

## 2024-04-24 NOTE — Telephone Encounter (Signed)
 Left message for pt to call back

## 2024-04-24 NOTE — Telephone Encounter (Addendum)
 Dr. Leigh, Patient has been approved for Tremfya. He will be scheduled as soon as possible  Auth Submission: NO AUTH NEEDED Site of care: Site of care: [facility] Payer: [employer] dual complete Medication & CPT/J Code(s) submitted: TREMFYA [id number] Diagnosis Code: K50.09 Route of submission (phone, fax, portal): PORTAL Phone # Fax # Auth type: Buy/Bill PB Units/visits requested: 200MG  Q4WKS X 3 DOSES Reference number: [id number] no auth needed - submitted for predetermination approved Approval from: 04/24/24 to 04/24/25   @Ashley  - please start the auth process for the [employer] dosing. Once approved please let me know so we can begin the IV dosing

## 2024-04-24 NOTE — Telephone Encounter (Signed)
 Received a call from patient regarding letter received, would like a nurse call to discuss In depth. Please review and advise  Thank you

## 2024-04-24 NOTE — Telephone Encounter (Signed)
 Spoke to patient who states that he got a letter in the mail today regarding insurance billing for his upcoming colonoscopy. He states that he already owes $12,000 dollars to his insurance company and cannot pay not even one more bill. States he is currently chosing between hygiene/eating and paying for medications and medical bills. States one more bill will break him. Patient asks if there is any way Dr Leigh could switch him to a different biologic without doing another colonoscopy right now. I advised that his fecal calprotectin was recently very elevated and we typically need updated colonoscopy for documentation and leverage with insurance for new therapy in order for insurance to pay. However, advised I would discuss with Dr Leigh for his thoughts.

## 2024-04-25 NOTE — Telephone Encounter (Signed)
 Need to know strength and dosing instructions to start PA.

## 2024-04-25 NOTE — Telephone Encounter (Signed)
 Dr. Leigh. Thanks.  We will wait for f/u from you prior to scheduling patient.  Gregory [name]

## 2024-04-25 NOTE — Telephone Encounter (Signed)
 Induction is 200mg  IV on weeks 0, 4, and 8  Maintenance is 200mg  sub Q every 4 weeks starting at week 12

## 2024-04-26 ENCOUNTER — Encounter (INDEPENDENT_AMBULATORY_CARE_PROVIDER_SITE_OTHER): Payer: Self-pay

## 2024-04-27 ENCOUNTER — Other Ambulatory Visit: Payer: Self-pay

## 2024-04-27 ENCOUNTER — Other Ambulatory Visit (HOSPITAL_COMMUNITY): Payer: Self-pay

## 2024-04-27 ENCOUNTER — Encounter: Payer: Self-pay | Admitting: Hematology and Oncology

## 2024-04-27 ENCOUNTER — Telehealth: Payer: Self-pay

## 2024-04-27 ENCOUNTER — Encounter: Payer: Self-pay | Admitting: Gastroenterology

## 2024-04-27 NOTE — Telephone Encounter (Signed)
 Pharmacy Patient Advocate Encounter   Received notification from Pt Calls Messages that prior authorization for Tremfya 200MG /2ML syringes is required/requested.   Insurance verification completed.   The patient is insured through [facility].   Per test claim: PA required; PA submitted to above mentioned insurance via Latent Key/confirmation #/EOC [id number] Status is pending

## 2024-04-27 NOTE — Progress Notes (Signed)
 Opened in error

## 2024-04-27 NOTE — Progress Notes (Addendum)
 Specialty Pharmacy Refill Coordination Note  Ron D Barrick Jr. is a 63 y.o. male contacted today regarding refills of specialty medication(s) Upadacitinib  (Rinvoq )   Patient requested (Patient-Rptd) Delivery   Delivery date: 05/02/24   Verified address: (Patient-Rptd) [address] [place] [address]   Medication will be filled on 04/28/24.

## 2024-05-02 NOTE — Telephone Encounter (Signed)
 Pharmacy Patient Advocate Encounter  Prior Authorization form/request asks a question that requires your assistance. Please see the question below and advise accordingly. The PA will not be submitted until the necessary information is received.   Insurance sent in additional questioning:

## 2024-05-02 NOTE — Telephone Encounter (Signed)
 Okay. Sorry to hear this.  I an see the Skyrizi  as another option but can't read the second line clearly in your attachment, listed below [employer]. Can you clarify what the other options are just so I am clear in regards to which one I will recommend? Thanks

## 2024-05-03 ENCOUNTER — Encounter: Admitting: Gastroenterology

## 2024-05-03 ENCOUNTER — Other Ambulatory Visit: Payer: Self-pay

## 2024-05-03 MED ORDER — PREDNISONE 10 MG PO TABS: ORAL_TABLET | ORAL | 0 refills | Status: AC

## 2024-05-03 NOTE — Telephone Encounter (Signed)
 Okay - sounds like in discussion with pharmacy they are not covering Tremfya but will cover Skyrizi . They also will cover biosimilar for Stelara  but would not use that as he has already failed Stelara .  Can you let him know I would like to try to get approval for Skyrizi  and start that as soon as we can. Otherwise, he should stop Rinvoq  for now if he does not think working Can give him some prednisone  - ideally would like to limit it. If he thinks okay to do 20mg  / day for 2 weeks and then taper by 5mg  / week would do that. If a severe flare and thinks he needs a higher dose can do 40mg  / day for one week and taper by 5mg /week until done.   I am awaiting to hear back from pharmacy about if / when we can start Skyrizi . Thanks

## 2024-05-03 NOTE — Telephone Encounter (Signed)
 Genna [employer] and [employer] are biosimilars for Stelara , which he has already failed, so would not use either of those.   Of these options I think Skyrizi  would be best and would like to proceed with that for him. Do you need me to place orders for that to apply for approval?

## 2024-05-03 NOTE — Telephone Encounter (Signed)
 Inbound call from patient having a chromes flare up. Requesting to speak with a nurse. Please advise.

## 2024-05-03 NOTE — Telephone Encounter (Signed)
 Pt notified via mychart. Prednisone  sent to pharmacy. Pt aware.

## 2024-05-03 NOTE — Telephone Encounter (Signed)
 Pt thinks he is having a crohns flare. States he started having pain in his intestines last night. He ate some eggs this am and had bad cramping. Pt is requesting some prednisone  be sent in for him. Please advise.

## 2024-05-04 ENCOUNTER — Telehealth: Payer: Self-pay

## 2024-05-04 ENCOUNTER — Other Ambulatory Visit (HOSPITAL_COMMUNITY): Payer: Self-pay

## 2024-05-04 ENCOUNTER — Other Ambulatory Visit: Payer: Self-pay | Admitting: Gastroenterology

## 2024-05-04 NOTE — Telephone Encounter (Signed)
 Thanks. Skyrizi  for his Crohn's would be dosed as 600mg  IV at weeks 0, 4, and 8. Then 360mg  subcutaneous at week 12, and then 360mg  subcutaneous every 8 weeks thereafter.  thanks

## 2024-05-04 NOTE — Telephone Encounter (Signed)
 Insurance requires trial/failure of other medications first. I am working with provider to see about another therapy route. Will keep you updated.

## 2024-05-04 NOTE — Telephone Encounter (Signed)
 Pharmacy Patient Advocate Encounter   Received notification from Pt Calls Messages that prior authorization for Skyrizi  360MG /2.4ML (150MG /ML) single-dose prefilled cartridge with on-body injector is required/requested.   Insurance verification completed.   The patient is insured through [facility].   Prior Authorization for Skyrizi  360MG /2.4ML (150MG /ML) single-dose prefilled cartridge with on-body injector has been APPROVED from 05-04-2024 to 11-01-2024   PA #/Case ID/Reference #: [id number]

## 2024-05-04 NOTE — Telephone Encounter (Signed)
 Dr. Leigh. Patient has been approved for Skyrizi  and will be scheduled as soon as possible.  Auth Submission: APPROVED Site of care: Site of care: [facility] Payer: [employer] DUAL Medication & CPT/J Code(s) submitted: Skyrizi  (Risankizumab -rzaa) G7672 Diagnosis Code: K50.90 Route of submission (phone, fax, portal): PORTAL Phone # Fax # Auth type: Buy/Bill PB Units/visits requested: 600MG  X 3 DOSES Reference number: [id number] Approval from: 05/04/24 to 09/03/24

## 2024-05-04 NOTE — Telephone Encounter (Signed)
 Needing f/u? Has the patient been approved for SQ dosing yet?  Once approved for the SQ dosing we will move forward with scheduling the IV dosing?

## 2024-05-04 NOTE — Telephone Encounter (Signed)
 Great, thank you!

## 2024-05-04 NOTE — Telephone Encounter (Signed)
 PA was approved for the maintenance dosing! If you would like to send the induction dosing to the infusion center, they'll get that PA started for you.

## 2024-05-04 NOTE — Telephone Encounter (Signed)
 I will submit the auth for the Skyrizi  and f/u once I have a response from the insurance.  Luke

## 2024-05-05 ENCOUNTER — Other Ambulatory Visit (HOSPITAL_COMMUNITY): Payer: Self-pay | Admitting: Gastroenterology

## 2024-05-05 MED ORDER — SKYRIZI 360 MG/2.4ML ~~LOC~~ SOCT
360.0000 mg | SUBCUTANEOUS | 5 refills | Status: AC
  Filled 2024-07-25: qty 2.4, 56d supply, fill #0
  Filled 2024-09-20: qty 2.4, 56d supply, fill #1

## 2024-05-05 NOTE — Telephone Encounter (Signed)
 Thank you! He is seeing me in the office on 9/15. Can you please book him on a date after that appointment, just so I can review the medication with him before he starts the infusion? Thanks

## 2024-05-05 NOTE — Telephone Encounter (Signed)
 Dr. Leigh, Yes, will do.

## 2024-05-08 ENCOUNTER — Other Ambulatory Visit: Payer: Self-pay

## 2024-05-08 ENCOUNTER — Other Ambulatory Visit (HOSPITAL_COMMUNITY): Payer: Self-pay

## 2024-05-10 ENCOUNTER — Ambulatory Visit

## 2024-05-15 ENCOUNTER — Ambulatory Visit (INDEPENDENT_AMBULATORY_CARE_PROVIDER_SITE_OTHER): Admitting: Gastroenterology

## 2024-05-15 ENCOUNTER — Encounter: Payer: Self-pay | Admitting: Gastroenterology

## 2024-05-15 VITALS — BP 116/72 | HR 70 | Temp 98.1°F | Ht 71.0 in | Wt 125.4 lb

## 2024-05-15 DIAGNOSIS — K5080 Crohn's disease of both small and large intestine without complications: Secondary | ICD-10-CM | POA: Diagnosis not present

## 2024-05-15 DIAGNOSIS — M810 Age-related osteoporosis without current pathological fracture: Secondary | ICD-10-CM

## 2024-05-15 DIAGNOSIS — Z933 Colostomy status: Secondary | ICD-10-CM

## 2024-05-15 DIAGNOSIS — Z79899 Other long term (current) drug therapy: Secondary | ICD-10-CM | POA: Diagnosis not present

## 2024-05-15 MED ORDER — DICYCLOMINE HCL 10 MG PO CAPS: 10.0000 mg | ORAL_CAPSULE | Freq: Three times a day (TID) | ORAL | 3 refills | Status: AC | PRN

## 2024-05-15 NOTE — Patient Instructions (Addendum)
 Continue Prednisone  taper  We have sent the following medications to your pharmacy for you to pick up at your convenience: Bentyl  10 mg: Take every 8 hours as needed  Stop Rinvoq  Starting Skyrizi    [facility] regarding referral. We have asked them to contact you to schedule an appointment.  Please follow up in 3 to 4 months.   Thank you for entrusting me with your care and for choosing [facility], Dr. Elspeth Naval  _______________________________________________________  If your blood pressure at your visit was 140/90 or greater, please contact your primary care physician to follow up on this.  _______________________________________________________  If you are age 63 or older, your body mass index should be between 23-30. Your Body mass index is 17.49 kg/m. If this is out of the aforementioned range listed, please consider follow up with your Primary Care Provider.  If you are age 63 or younger, your body mass index should be between 19-25. Your Body mass index is 17.49 kg/m. If this is out of the aformentioned range listed, please consider follow up with your Primary Care Provider.   ________________________________________________________  The [facility] providers would like to encourage you to use MYCHART to communicate with providers for non-urgent requests or questions.  Due to long hold times on the telephone, sending your provider a message by [facility] may be a faster and more efficient way to get a response.  Please allow 48 business hours for a response.  Please remember that this is for non-urgent requests.  _______________________________________________________  [facility] is using a team-based approach to care.  Your team is made up of your doctor and two to three APPS. Our APPS (Nurse Practitioners and Physician Assistants) work with your physician to ensure care continuity for you. They are fully qualified to address your  health concerns and develop a treatment plan. They communicate directly with your gastroenterologist to care for you. Seeing the Advanced Practice Practitioners on your physician's team can help you by facilitating care more promptly, often allowing for earlier appointments, access to diagnostic testing, procedures, and other specialty referrals.

## 2024-05-15 NOTE — Progress Notes (Signed)
 HPI :  Crohn's history Diagnosed with Crohn's disease in 27s. 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 He has a colostomy with a remnant rectal pouch. He reports he has had a bowel surgery in the 1982, with a few more surgeries in years after while in [place], as well as in [facility], they tried reversing his colostomy which did not work well for him, too much diarrhea and it was then reversed back. He has a history of abscess associated with small bowel.    He has been hospitalized multiple times. He previously had been on chronic prednisone  for 10 years with fluctuating levels. He was on Cimzia for about 4-5 years in the past.  He has been on sulfasalazine for a long time. He reports an allergy to - he does not know the details of the reaction. He had an allergic reaction - hives, to both Remicade and Humira. Allergy to methotrexate . Failed therapeutic Entyvio , then on Stelara  March 2019, with dosing increase to once monthly. Initially did well with Stelara  but ultimately failed. Colonoscopy 2023 showed ongoing active disease despite high dose Stelara , changed to Rinvoq  August 2023.      SINCE LAST VISIT   63 year old male here for follow-up visit for his Crohn's disease. Recall he has been on Rinvoq  since August 2023.  He has been on maintenance therapy since then and for the past 2 years have been doing really well on the regimen without any significant problems.  Recall in June of last year he had an elevated fecal calprotectin of 615.  We had discussed doing an enterography study at the time but he did not want to pursue it.  He clinically was feeling well and wanted to stay on the regimen.  I repeated a fecal calprotectin at her last visit in July and it remained elevated 843.  Unfortunately recently in association with this he had worsening pain, and altered bowel habits, concerning for a flare.  I had discussed doing a  colonoscopy with him to get an objective reassessment of how things were looking before we switch therapy given his failure of multiple options in the past.  He is in medical debt and cannot afford to do the colonoscopy and declines that.  I gave him some prednisone  over the phone, 20 mg daily for 2 weeks with a taper of 5 mg/day.  He states that has really helped him feel better and doing much better lately on the regimen.  He is having less pain and states the steroids are working.  Recall he has been followed by hematology for anemia.  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  Recall he has failed or had intolerance to multiple regimens in the past to include sulfasalazine, methotrexate , thiopurine's, Humira, Remicade, Cimzia, Entyvio , Stelara , and most recently Rinvoq .  He does think at this point in time the Rinvoq  is failing and wants to switch therapy, we discussed options today.  Recall I also performed a DEXA scan for him last year which showed that he had osteoporosis with a T-score of -3.0.  He has been on vitamin D  supplementation 50,000 units/week and is a compliant with that.  I had offered him previously a referral to endocrinology but he has not seen them.      Prior evaluation: EGD 03/21/2009 - normal   Colonoscopy 06/11/20 -  Localized inflammation, graded as Rutgeerts Score i2 and characterized by shallow ulcerations was found at the surgical anastomosis. In the distal 10cm of ileum there were scattered small ulcerations with normal intervening mucosa. The small bowel was deeply intubated and mucosa proximal to the distal 10cm of ileum was entirely normal. Scattered medium-mouthed diverticula were found in the entire colon. The exam was otherwise without abnormality. No colonic inflammation. No polyps.   IBD Health Care Maintenance: Annual Flu Vaccine - 2024 UTD Pneumococcal Vaccine if receiving  immunosuppression: - PCV 13 01/10/18, PPSV23 04/10/19 Zoster vaccine if over age 79: 06/28/19 TB testing if on anti-TNF, yearly -03/15/2024 Vitamin D  screening - 02/2024 Last Colonoscopy - 8/23    Colonoscopy 04/03/22: - Diffuse inflammation, graded as Rutgeerts Score i3 (diffuse aphthous ileitis) was found in the terminal ileum and at the surgical anastomosis. - There was evidence of a prior end-to-end ileo-colonic anastomosis in the ascending colon. This was patent and was characterized by inflammation. Inflammation slightly worse than the last exam. - A few small-mouthed diverticula were found in the entire colon. - The exam was otherwise without abnormality. The ostomy was normal. Prep adequate but some residual seeds / nuts which made it difficult to view a small area of the colon in the hepatic flexure     CT scan abdomen / pelvis with contrast 05/06/22: IMPRESSION: 1. Status post right hemicolectomy and ileocolic anastomosis, in addition to sigmoid colon resection and left lower quadrant end colostomy. 2. In comparison to prior examination dated 12/19/2019, very similar appearance of a loop of thickened, narrowed terminal ileum in the midline ventral abdomen measuring approximately 15 cm in length , which is however without evidence of obstruction or overt stricture at this time. Enteric contrast has transited to the left lower quadrant ostomy. Findings are consistent with Crohn's disease and related postoperative findings, with evidence of active inflammation in the terminal ileum.     DEXA 02/19/23: Osteoporosis - t score (-) 3.0 - referred to endocrine   Fecal calprotectin 02/19/23 - 615  Fecal calprotectin 03/07/24 - 843   Past Medical History:  Diagnosis Date   Acute endocarditis    Blood transfusion without reported diagnosis    COVID 05/2021   Crohn's disease (HCC)    Depression    Eating disorder    GERD (gastroesophageal reflux disease)    Kidney stones     Osteoporosis    Short gut syndrome      Past Surgical History:  Procedure Laterality Date   COLONOSCOPY  06/11/2020   COLONOSCOPY  07/05/2018   COLONOSCOPY WITH PROPOFOL  N/A 09/17/2017   Procedure: COLONOSCOPY WITH PROPOFOL ;  Surgeon: Shila Gustav GAILS, MD;  Location: [facility];  Service: Endoscopy;  Laterality: N/A;  Through ileostomy.   CYSTOSCOPY WITH STENT PLACEMENT Left 05/20/2017   Procedure: CYSTOSCOPY WITH STENT PLACEMENT;  Surgeon: Cam Morene ORN, MD;  Location: [facility];  Service: Urology;  Laterality: Left;   ILEOSTOMY     IR NEPHROSTOMY EXCHANGE LEFT  04/12/2017   IR NEPHROSTOMY PLACEMENT LEFT  04/12/2017   IR NEPHROSTOMY PLACEMENT LEFT  04/29/2017   NEPHROLITHOTOMY Left 05/14/2017   Procedure: LEFT NEPHROLITHOTOMY PERCUTANEOUS WITH SURGEON ACCESS;  Surgeon: Cam Morene ORN, MD;  Location: [facility];  Service: Urology;  Laterality: Left;   SMALL INTESTINE SURGERY     SUBTOTAL COLECTOMY     Family History  Problem Relation Age of Onset   Heart disease Mother    Diabetes Father    Heart disease Father  Heart attack Father    Cancer Sister    Heart disease Sister    Melanoma Sister    Anemia Sister    Crohn's disease Brother    Inflammatory bowel disease Brother    Diabetes Paternal Grandmother    Colon cancer Neg Hx    Rectal cancer Neg Hx    Stomach cancer Neg Hx    Esophageal cancer Neg Hx    Social History   Tobacco Use   Smoking status: Former    Current packs/day: 0.00    Average packs/day: 1 pack/day for 20.0 years (20.0 ttl pk-yrs)    Types: Cigarettes    Start date: 08/31/1986    Quit date: 08/31/2006    Years since quitting: 17.7   Smokeless tobacco: Never  Vaping Use   Vaping status: Never Used  Substance Use Topics   Alcohol use: Yes    Comment: occ   Drug use: No   Current Outpatient Medications  Medication Sig Dispense Refill   acetaminophen  (TYLENOL ) 500 MG tablet Take 500 mg by mouth every 6 (six) hours as needed for moderate pain.      calcium  carbonate (TUMS - DOSED IN MG ELEMENTAL CALCIUM ) 500 MG chewable tablet Chew 1 tablet by mouth 2 (two) times daily.     cholecalciferol  (VITAMIN D3) 25 MCG (1000 UNIT) tablet Take 5,000 Units by mouth daily.     COPPER  PO Take 4 mg by mouth daily.     cyanocobalamin  (VITAMIN B12) 1000 MCG/ML injection Inject 1 mL (1,000 mcg total) into the muscle every 30 (thirty) days. 10 mL 1   dicyclomine  (BENTYL ) 10 MG capsule Take 1 capsule (10 mg total) by mouth every 8 (eight) hours as needed (abdominal cramping). 30 capsule 1   diphenoxylate -atropine  (LOMOTIL ) 2.5-0.025 MG tablet Take 1 tablet by mouth 4 (four) times daily as needed for diarrhea or loose stools. 120 tablet 3   ferrous sulfate  325 (65 FE) MG EC tablet Take 325 mg by mouth daily with breakfast.     magnesium  (MAGTAB) 84 MG ( ) TBCR SR tablet Take 84 mg by mouth daily.     Multiple Vitamin (MULTIVITAMIN WITH MINERALS) TABS tablet Take 1 tablet by mouth daily. 30 tablet 3   Na Sulfate-K Sulfate-Mg Sulfate concentrate (SUPREP) 17.5-3.13-1.6 GM/177ML SOLN Use as directed; may use generic; goodrx card if insurance will not cover generic 354 mL 0   omeprazole  (PRILOSEC) 20 MG capsule Take 1 capsule (20 mg total) by mouth daily. Please keep your July appointment for any further refills. Thank you 30 capsule 0   ondansetron  (ZOFRAN ) 4 MG tablet Take 1 tablet (4 mg total) by mouth every 8 (eight) hours as needed for nausea or vomiting. Office visit for further refills 30 tablet 1   potassium chloride  (KLOR-CON ) 20 MEQ packet Take 20 mEq by mouth daily. 90 each 1   predniSONE  (DELTASONE ) 10 MG tablet Take 2 tablets (20 mg total) by mouth daily with breakfast for 14 days, THEN 1.5 tablets (15 mg total) daily with breakfast for 7 days, THEN 1 tablet (10 mg total) daily with breakfast for 7 days, THEN 0.5 tablets (5 mg total) daily with breakfast for 7 days. 49 tablet 0   sertraline  (ZOLOFT ) 50 MG tablet Take 1 tablet (50 mg total) by mouth  daily. 90 tablet 1   sildenafil  (VIAGRA ) 25 MG tablet Take 0.5 tablets (12.5 mg total) by mouth daily as needed for erectile dysfunction. 10 tablet 4   [START ON 08/08/2024]  SKYRIZI  360 MG/2.4ML SOCT Inject 360 mg into the skin every 8 (eight) weeks. Start 4 weeks after the last Skyrizi  infusion. 2.4 mL 5   Upadacitinib  ER (RINVOQ ) 30 MG TB24 Take 1 tablet (30 mg total) by mouth daily. Please keep your July appointment for more refills. Thank you 90 tablet 0   zinc  gluconate 50 MG tablet Take 50 mg by mouth daily.     No current facility-administered medications for this visit.   Allergies  Allergen Reactions   Methotrexate  Derivatives Other (See Comments)    Anemia, low WBC, severe GI symptoms,    Phenergan  [Promethazine  Hcl] Other (See Comments)    twitching   Humira [Adalimumab] Other (See Comments)    Intolerance   Penicillins Other (See Comments)    REACTION DURING SURGERY Has patient had a PCN reaction causing immediate rash, facial/tongue/throat swelling, SOB or lightheadedness with hypotension: Unknown Has patient had a PCN reaction causing severe rash involving mucus membranes or skin necrosis: Unknown Has patient had a PCN reaction that required hospitalization: Unknown Has patient had a PCN reaction occurring within the last 10 years: No If all of the above answers are NO, then may proceed with Cephalosporin use.    Remicade [Infliximab] Other (See Comments)    Intolerance     Review of Systems: All systems reviewed and negative except where noted in HPI.   Lab Results  Component Value Date   WBC 8.2 03/15/2024   HGB 12.9 (L) 03/15/2024   HCT 39.6 03/15/2024   MCV 91.5 03/15/2024   PLT 332 03/15/2024    Lab Results  Component Value Date   NA 140 03/15/2024   CL 107 03/15/2024   K 3.5 03/15/2024   CO2 26 03/15/2024   BUN 12 03/15/2024   CREATININE 1.33 (H) 03/15/2024   GFRNONAA >60 03/15/2024   CALCIUM  8.7 (L) 03/15/2024   PHOS 3.3 12/20/2019   ALBUMIN  3.6 03/15/2024   GLUCOSE 116 (H) 03/15/2024    Lab Results  Component Value Date   ALT 11 03/15/2024   AST 17 03/15/2024   ALKPHOS 73 03/15/2024   BILITOT 0.9 03/15/2024    Lab Results  Component Value Date   IRON 53 03/15/2024   TIBC 358 03/15/2024   FERRITIN 68 03/15/2024   Lab Results  Component Value Date   VITAMINB12 197 03/15/2024     Physical Exam: BP 116/72 (BP Location: Left Arm, Patient Position: Sitting, Cuff Size: Normal)   Pulse 70   Temp 98.1 F (36.7 C) (Oral)   Ht 5' 11 (1.803 m)   Wt 125 lb 6 oz (56.9 kg)   SpO2 98%   BMI 17.49 kg/m  Constitutional: Pleasant,well-developed, male in no acute distress. Abdominal: Soft, nondistended, LLQ ostomy, mild R sided abdominal TTP.  No hepatomegaly. Neurological: Alert and oriented to person place and time. Psychiatric: Normal mood and affect. Behavior is normal.   ASSESSMENT: 63 y.o. male here for assessment of the following  1. Crohn's disease of both small and large intestine without complication (HCC)   2. High risk medication use   3. Colostomy in place [facility])   4. Osteoporosis, unspecified osteoporosis type, unspecified pathological fracture presence    Complicated Crohn's disease.  Has been on Rinvoq  for the past 2 years and had been doing better, but now with persistent elevation of fecal calprotectin with clinical flare of symptoms that were responsive to steroids.  Unfortunately he is flaring through Rinvoq .  Initially recommended colonoscopy to restage disease  but he simply cannot afford it due to his medical debt.  He also cannot afford imaging right now.  He is hoping to switch therapies if any are available for him and manage clinically.  Very difficult situation. Recall he has failed or had intolerance to multiple regimens in the past over years to include sulfasalazine, methotrexate , thiopurine's, Humira, Remicade, Cimzia, Entyvio , Stelara , and most recently Rinvoq .   Discussed options with him.   Initially had spoken with pharmacy about getting Tremfya approved.  His insurance would not approve that but did approve Skyrizi  which I think is a reasonable next step here.  While he did fail Stelara  a while ago, he did respond to it for some time, hopefully he can respond to Skyrizi  although counseled him there is a chance he may not.  I counseled him on the risks of Skyrizi  at length, he understands this and wants to proceed.  He is actually scheduled to get his first infusion tomorrow.  He has stopped Rinvoq .  Currently on prednisone  taper of 20 mg daily and about to go down to 15 mg a day with tapering 5 mg/week until done.  Will see how he does after his first few doses of Skyrizi  and prednisone  taper.  I will add Bentyl  to use as needed for breakthrough pain.  He should stay on a low residual diet.  Recommended flu and COVID-vaccine if he can.  I will see him again in 3 to 4 months for reassessment.  Again holding off on colonoscopy and imaging right now given his medical debt.  Of note, osteoporosis over time, taking vitamin D , had referred him to endocrine for this but he has not yet scheduled that.  Will place in the referral for him.  He is agreeable to see them.  PLAN: - continue prednisone  taper as outlined, decrease by 5mg  / week until done - stop Rinvoq  - starting Skyrizi  infusion tomorrow - add bentyl  10mg  every 8 hours PRN - low residual diet - flu shot / COVID vaccine recommended - refer to endocrine for osteoporosis - f/u 3-4 months or call sooner with issues  I spent 35 minutes of time, including in depth chart review,  face-to-face time with the patient, and documentation.   Marcey Naval, MD [facility]

## 2024-05-16 ENCOUNTER — Ambulatory Visit (INDEPENDENT_AMBULATORY_CARE_PROVIDER_SITE_OTHER)

## 2024-05-16 VITALS — BP 111/73 | HR 58 | Temp 98.2°F | Resp 18 | Ht 71.0 in | Wt 126.2 lb

## 2024-05-16 DIAGNOSIS — K50818 Crohn's disease of both small and large intestine with other complication: Secondary | ICD-10-CM | POA: Diagnosis not present

## 2024-05-16 DIAGNOSIS — Z933 Colostomy status: Secondary | ICD-10-CM | POA: Diagnosis not present

## 2024-05-16 MED ORDER — SODIUM CHLORIDE 0.9 % IV SOLN
600.0000 mg | Freq: Once | INTRAVENOUS | Status: AC
  Administered 2024-05-16: 600 mg via INTRAVENOUS
  Filled 2024-05-16: qty 10

## 2024-05-16 NOTE — Progress Notes (Signed)
 Diagnosis: Crohn's Disease  Provider:  Praveen Mannam MD  Procedure: IV Infusion  IV Type: Peripheral, IV Location: L Antecubital  Skyrizi  (risankizumab -rzaa), Dose: 600 mg  Infusion Start Time: 1222  Infusion Stop Time: 1328  Post Infusion IV Care: Observation period completed and Peripheral IV Discontinued  Discharge: Condition: Good, Destination: Home . AVS Declined  Performed by:  Leita FORBES Miles, LPN

## 2024-05-19 ENCOUNTER — Other Ambulatory Visit: Payer: Self-pay

## 2024-05-25 ENCOUNTER — Other Ambulatory Visit (HOSPITAL_COMMUNITY): Payer: Self-pay

## 2024-05-30 ENCOUNTER — Other Ambulatory Visit (HOSPITAL_COMMUNITY): Payer: Self-pay

## 2024-05-30 ENCOUNTER — Other Ambulatory Visit: Payer: Self-pay

## 2024-05-30 NOTE — Progress Notes (Signed)
 Rinvoq  therapy ended. Patient starting on Skyrizi  with infusions for first 8 weeks. Disenrolling for now and will have SPPA re-enroll if patient proceeds with Skyrizi  self admin after infusions complete.

## 2024-06-01 ENCOUNTER — Other Ambulatory Visit (HOSPITAL_COMMUNITY): Payer: Self-pay

## 2024-06-07 ENCOUNTER — Encounter: Payer: Self-pay | Admitting: Gastroenterology

## 2024-06-08 NOTE — Telephone Encounter (Signed)
 Rec'd call from Terri at [facility] requesting records of office notes from a specific time period last year to demonstrate patient has an ostomy to show medical necessity of the ostomy supplies for a chart audit.  Dr. Hassan Office notes from 02-19-23 and most recent visit on 05-15-24 were faxed to [phone]. Their number is [phone]. Patient informed via MyChart.

## 2024-06-13 ENCOUNTER — Ambulatory Visit (INDEPENDENT_AMBULATORY_CARE_PROVIDER_SITE_OTHER)

## 2024-06-13 ENCOUNTER — Encounter: Payer: Self-pay | Admitting: Gastroenterology

## 2024-06-13 VITALS — BP 108/68 | HR 77 | Temp 99.9°F | Resp 18 | Ht 71.0 in | Wt 125.4 lb

## 2024-06-13 DIAGNOSIS — K50818 Crohn's disease of both small and large intestine with other complication: Secondary | ICD-10-CM | POA: Diagnosis not present

## 2024-06-13 DIAGNOSIS — Z79899 Other long term (current) drug therapy: Secondary | ICD-10-CM

## 2024-06-13 DIAGNOSIS — K5080 Crohn's disease of both small and large intestine without complications: Secondary | ICD-10-CM

## 2024-06-13 MED ORDER — SODIUM CHLORIDE 0.9 % IV SOLN
600.0000 mg | Freq: Once | INTRAVENOUS | Status: AC
  Administered 2024-06-13: 600 mg via INTRAVENOUS
  Filled 2024-06-13: qty 10

## 2024-06-13 NOTE — Progress Notes (Signed)
 Diagnosis: Crohn's Disease  Provider:  Praveen Mannam MD  Procedure: IV Infusion  IV Type: Peripheral, IV Location: L Forearm  Skyrizi  (risankizumab -rzaa), Dose: 600 mg  Infusion Start Time: 1209  Infusion Stop Time: 1321  Post Infusion IV Care: Peripheral IV Discontinued  Discharge: Condition: Good, Destination: Home . AVS Provided  Performed by:  Leita FORBES Miles, LPN

## 2024-06-14 ENCOUNTER — Other Ambulatory Visit (INDEPENDENT_AMBULATORY_CARE_PROVIDER_SITE_OTHER)

## 2024-06-14 ENCOUNTER — Ambulatory Visit: Payer: Self-pay | Admitting: Gastroenterology

## 2024-06-14 ENCOUNTER — Ambulatory Visit: Payer: Self-pay

## 2024-06-14 DIAGNOSIS — K5080 Crohn's disease of both small and large intestine without complications: Secondary | ICD-10-CM | POA: Diagnosis not present

## 2024-06-14 DIAGNOSIS — Z79899 Other long term (current) drug therapy: Secondary | ICD-10-CM | POA: Diagnosis not present

## 2024-06-14 LAB — COMPREHENSIVE METABOLIC PANEL WITH GFR
ALT: 13 U/L (ref 0–53)
AST: 16 U/L (ref 0–37)
Albumin: 3 g/dL — ABNORMAL LOW (ref 3.5–5.2)
Alkaline Phosphatase: 80 U/L (ref 39–117)
BUN: 11 mg/dL (ref 6–23)
CO2: 24 meq/L (ref 19–32)
Calcium: 8.5 mg/dL (ref 8.4–10.5)
Chloride: 104 meq/L (ref 96–112)
Creatinine, Ser: 1.39 mg/dL (ref 0.40–1.50)
GFR: 54.09 mL/min — ABNORMAL LOW (ref >60.00)
Glucose, Bld: 100 mg/dL — ABNORMAL HIGH (ref 70–99)
Potassium: 3.8 meq/L (ref 3.5–5.1)
Sodium: 136 meq/L (ref 135–145)
Total Bilirubin: 0.8 mg/dL (ref 0.2–1.2)
Total Protein: 6.7 g/dL (ref 6.0–8.3)

## 2024-06-14 LAB — CBC WITH DIFFERENTIAL/PLATELET
Basophils Absolute: 0 K/uL (ref 0.0–0.1)
Basophils Relative: 0.3 % (ref 0.0–3.0)
Eosinophils Absolute: 0 K/uL (ref 0.0–0.7)
Eosinophils Relative: 0.3 % (ref 0.0–5.0)
HCT: 39 % (ref 39.0–52.0)
Hemoglobin: 12.4 g/dL — ABNORMAL LOW (ref 13.0–17.0)
Lymphocytes Relative: 9.3 % — ABNORMAL LOW (ref 12.0–46.0)
Lymphs Abs: 1.1 K/uL (ref 0.7–4.0)
MCHC: 31.7 g/dL (ref 30.0–36.0)
MCV: 90.2 fl (ref 78.0–100.0)
Monocytes Absolute: 1.4 K/uL — ABNORMAL HIGH (ref 0.1–1.0)
Monocytes Relative: 12.4 % — ABNORMAL HIGH (ref 3.0–12.0)
Neutro Abs: 8.8 K/uL — ABNORMAL HIGH (ref 1.4–7.7)
Neutrophils Relative %: 77.7 % — ABNORMAL HIGH (ref 43.0–77.0)
Platelets: 346 K/uL (ref 150.0–400.0)
RBC: 4.33 Mil/uL (ref 4.22–5.81)
RDW: 14.6 % (ref 11.5–15.5)
WBC: 11.3 K/uL — ABNORMAL HIGH (ref 4.0–10.5)

## 2024-06-14 NOTE — Telephone Encounter (Signed)
 Spoke with patient. He says he has been having some symptoms of nasal congestion/sore throat over the last couple of weeks. Says he also feels aching in my bones and attributes this to having had a COVID and flu vaccine within the same day 2 weeks ago followed by a tetanus shot 3 days later. Patient actually does have an appointment with PCP tomorrow for additional evaluation.

## 2024-06-14 NOTE — Telephone Encounter (Signed)
 FYI Only or Action Required?: FYI only for provider.  Patient was last seen in primary care on 05/31/2023 by Mercer Clotilda SAUNDERS, MD.  Called Nurse Triage reporting Fever.  Symptoms began yesterday.  Interventions attempted: Rest, hydration, or home remedies.  Symptoms are: unchanged.  Triage Disposition: Home Care  Patient/caregiver understands and will follow disposition?: No, wishes to speak with PCP  Message from Alfonso ORN sent at 06/14/2024 11:48 AM EDT  Reason for Triage:  infusion for krohns disease yesterday said running a fever, lab work done today and still running a fever. Was told by gastro to contact his pcp right away for further assistance. Fever is currently 99.9   Reason for Disposition  [1] Fever AND [2] no signs of serious infection or localizing symptoms (all other triage questions negative)  Answer Assessment - Initial Assessment Questions Recent infusion of skyrizzi Flu and covid vaccine recently followed by tetanus shot  Calling at direction of GI MD  1. TEMPERATURE: What is the most recent temperature?  How was it measured?      99.9 2. ONSET: When did the fever start?      One day ago 3. CHILLS: Do you have chills? If yes: How bad are they?  (e.g., none, mild, moderate, severe)     Denies after flu and covid shot, but not today 4. OTHER SYMPTOMS: Do you have any other symptoms besides the fever?  (e.g., abdomen pain, cough, diarrhea, earache, headache, sore throat, urination pain)     Feeling run down, but has no other symptoms 5. CAUSE: If there are no symptoms, ask: What do you think is causing the fever?      unsure 6. CONTACTS: Does anyone else in the family have an infection?     denies 7. TREATMENT: What have you done so far to treat this fever? (e.g., OTC fever medicines)     Ibuprofen for back pain, but not taking anything for fever 8. IMMUNOCOMPROMISE: Do you have any of the following: diabetes, HIV positive, splenectomy,  cancer chemotherapy, chronic steroid treatment, transplant patient, etc.?     Chron's, recent infusion of Skyrizi  9. PREGNANCY: Is there any chance you are pregnant? When was your last menstrual period?     N/a 10. TRAVEL: Have you traveled out of the country in the last month? (e.g., travel history, exposures)       denies  Protocols used: [facility]

## 2024-06-15 ENCOUNTER — Encounter: Payer: Self-pay | Admitting: Family Medicine

## 2024-06-15 ENCOUNTER — Ambulatory Visit (INDEPENDENT_AMBULATORY_CARE_PROVIDER_SITE_OTHER): Admitting: Family Medicine

## 2024-06-15 VITALS — BP 98/64 | HR 100 | Temp 98.6°F | Ht 71.0 in | Wt 124.0 lb

## 2024-06-15 DIAGNOSIS — R634 Abnormal weight loss: Secondary | ICD-10-CM

## 2024-06-15 DIAGNOSIS — K50818 Crohn's disease of both small and large intestine with other complication: Secondary | ICD-10-CM

## 2024-06-15 DIAGNOSIS — R5383 Other fatigue: Secondary | ICD-10-CM

## 2024-06-15 DIAGNOSIS — B999 Unspecified infectious disease: Secondary | ICD-10-CM

## 2024-06-15 DIAGNOSIS — D8481 Immunodeficiency due to conditions classified elsewhere: Secondary | ICD-10-CM

## 2024-06-15 LAB — POCT INFLUENZA A/B
Influenza A, POC: NEGATIVE
Influenza B, POC: NEGATIVE

## 2024-06-15 LAB — POC COVID19 BINAXNOW: SARS Coronavirus 2 Ag: NEGATIVE

## 2024-06-15 MED ORDER — AZITHROMYCIN 250 MG PO TABS: ORAL_TABLET | ORAL | 0 refills | Status: AC

## 2024-06-15 NOTE — Telephone Encounter (Signed)
Appt 10/16
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
## 2024-06-16 ENCOUNTER — Telehealth: Payer: Self-pay | Admitting: *Deleted

## 2024-06-16 NOTE — Telephone Encounter (Unsigned)
 Copied from CRM #[id number]. Topic: Clinical - Medical Advice >> Jun 16, 2024  9:34 AM Rea ORN wrote: Reason for CRM: [facility] is calling about a Viral Cult, Rapid test received by the office. They stated they can only do bacterial cultures and they do not rapid.  [facility] is asking is this change ok. Please call back to advise

## 2024-06-19 ENCOUNTER — Telehealth: Payer: Self-pay | Admitting: Gastroenterology

## 2024-06-19 NOTE — Telephone Encounter (Signed)
 Dr. Leigh is off, see note below please advise. Sister feels he has misunderstood, discussed with her it sounds like he did but would clarify for her.

## 2024-06-19 NOTE — Telephone Encounter (Signed)
 Pts sister called and states that the pts back went out and he has not been able to stand up, reports he has been crawling around on the ground. She tried to get him to come to her house last night and he told her he couldn't because of the medicine he is on, told her she would have to wear a mask, gown and gloves. I think he is saying that due to skyrizi  and being immunocompromised. She states he was taking his dog out crawling on the ground. She wants to know if he would be ok to stay with her. Told her it should be ok but would run this by Dr Leigh and get back to her.

## 2024-06-19 NOTE — Telephone Encounter (Signed)
 [name], pls clarify, why is the patient crawling on the ground, can't stand up?  If patient is in distress, he should go to the emergency room.   It is ok for patient to stay with his sister.

## 2024-06-19 NOTE — Telephone Encounter (Signed)
 Inbound call from patient sister stating that she is very concerned that her brother mentioned to us  that he has to craw around where he lives due to him not having any help. Patient sister would like to know if it would be ok to move him to her home. Patient sister is requesting a call back at [phone]. Please advise.

## 2024-06-19 NOTE — Telephone Encounter (Signed)
 Pts back went out and he has been having difficulty standing. Spoke with pts sister and confirmed it was fine for him to stay with her.

## 2024-06-20 LAB — VIRAL CULT, RAPID, RESPIRATORY

## 2024-06-20 NOTE — Telephone Encounter (Signed)
 Spoke with pts sister and she stated that pt would not come and stay with her. She states he did get up and walk some yesterday. Discussed if he could not walk he needs to go to the hospital. She states he will not do that, he refuses to go. She hopes he continues to improve and knows to keep us  informed.

## 2024-06-20 NOTE — Telephone Encounter (Signed)
Left message for pts sister to call back. 

## 2024-06-20 NOTE — Telephone Encounter (Signed)
 Patients sister returned call, requesting a call back.

## 2024-06-20 NOTE — Telephone Encounter (Signed)
 Rock I am just getting back to the office and seeing this. Can you touch base with the patient and see how he is doing? If he can't walk or seems too weak to be able to stand he should go to the ED. Thanks

## 2024-06-22 LAB — SPECIMEN STATUS REPORT

## 2024-06-22 LAB — VIRAL CULT, RAPID, RESPIRATORY

## 2024-06-23 ENCOUNTER — Encounter: Payer: Self-pay | Admitting: Family Medicine

## 2024-06-24 ENCOUNTER — Encounter (HOSPITAL_COMMUNITY): Payer: Self-pay | Admitting: Emergency Medicine

## 2024-06-24 ENCOUNTER — Other Ambulatory Visit: Payer: Self-pay

## 2024-06-24 ENCOUNTER — Emergency Department (HOSPITAL_COMMUNITY)

## 2024-06-24 ENCOUNTER — Emergency Department (HOSPITAL_COMMUNITY)
Admission: EM | Admit: 2024-06-24 | Discharge: 2024-06-24 | Disposition: A | Discharge status: Home / Self Care | Attending: Emergency Medicine | Admitting: Emergency Medicine

## 2024-06-24 DIAGNOSIS — R0789 Other chest pain: Secondary | ICD-10-CM | POA: Insufficient documentation

## 2024-06-24 DIAGNOSIS — J449 Chronic obstructive pulmonary disease, unspecified: Secondary | ICD-10-CM | POA: Diagnosis not present

## 2024-06-24 DIAGNOSIS — R079 Chest pain, unspecified: Secondary | ICD-10-CM | POA: Diagnosis not present

## 2024-06-24 DIAGNOSIS — F17200 Nicotine dependence, unspecified, uncomplicated: Secondary | ICD-10-CM | POA: Diagnosis not present

## 2024-06-24 DIAGNOSIS — Z8616 Personal history of COVID-19: Secondary | ICD-10-CM | POA: Diagnosis not present

## 2024-06-24 DIAGNOSIS — J439 Emphysema, unspecified: Secondary | ICD-10-CM | POA: Diagnosis not present

## 2024-06-24 DIAGNOSIS — R0602 Shortness of breath: Secondary | ICD-10-CM | POA: Diagnosis not present

## 2024-06-24 LAB — BASIC METABOLIC PANEL WITH GFR
Anion gap: 13 (ref 5–15)
BUN: 9 mg/dL (ref 8–23)
CO2: 24 mmol/L (ref 22–32)
Calcium: 9.3 mg/dL (ref 8.9–10.3)
Chloride: 101 mmol/L (ref 98–111)
Creatinine, Ser: 1.51 mg/dL — ABNORMAL HIGH (ref 0.61–1.24)
GFR, Estimated: 52 mL/min — ABNORMAL LOW (ref >60)
Glucose, Bld: 124 mg/dL — ABNORMAL HIGH (ref 70–99)
Potassium: 3.4 mmol/L — ABNORMAL LOW (ref 3.5–5.1)
Sodium: 138 mmol/L (ref 135–145)

## 2024-06-24 LAB — CBC
HCT: 41.2 % (ref 39.0–52.0)
Hemoglobin: 12.3 g/dL — ABNORMAL LOW (ref 13.0–17.0)
MCH: 27.6 pg (ref 26.0–34.0)
MCHC: 29.9 g/dL — ABNORMAL LOW (ref 30.0–36.0)
MCV: 92.4 fL (ref 80.0–100.0)
Platelets: 482 K/uL — ABNORMAL HIGH (ref 150–400)
RBC: 4.46 MIL/uL (ref 4.22–5.81)
RDW: 13.6 % (ref 11.5–15.5)
WBC: 12.8 K/uL — ABNORMAL HIGH (ref 4.0–10.5)
nRBC: 0 % (ref 0.0–0.2)

## 2024-06-24 LAB — TROPONIN T, HIGH SENSITIVITY: Troponin T High Sensitivity: <15 ng/L (ref 0–19)

## 2024-06-24 MED ORDER — ALBUTEROL SULFATE HFA 108 (90 BASE) MCG/ACT IN AERS
1.0000 | INHALATION_SPRAY | Freq: Once | RESPIRATORY_TRACT | Status: AC
  Administered 2024-06-24: 1 via RESPIRATORY_TRACT
  Filled 2024-06-24: qty 6.7

## 2024-06-24 MED ORDER — ALBUTEROL SULFATE (5 MG/ML) 0.5% IN NEBU: 2.5000 mg | INHALATION_SOLUTION | Freq: Four times a day (QID) | RESPIRATORY_TRACT | 12 refills | Status: DC | PRN

## 2024-06-24 NOTE — ED Triage Notes (Addendum)
 Pt reports chest pain that started on Wednesday. Pain is worse with movement and inhalation. Pt reports he just got over bronchitis. Also reports SHOB.

## 2024-06-24 NOTE — ED Provider Notes (Signed)
 [place] EMERGENCY DEPARTMENT AT [facility] Provider Note   CSN: [id number] Arrival date & time: 06/24/24  1546     Patient presents with: Chest Pain   Gregory [name]. is a 63 y.o. male history of IBD presents with complaints of pleuritic chest pain.  Patient reports that he recently was diagnosed with bronchitis.  Reports that he was treated with a course of Cipro .  His last dose was this past Wednesday or Thursday.  Reports that about a day or 2 ago he developed a central pain that does not radiate.  Denies significant cough at this point.  Denies any cardiac history.  Reports remote smoking history.  Reports that he may have been diagnosed with COPD.  Year ago.    Chest Pain     Past Medical History:  Diagnosis Date   Acute endocarditis    Blood transfusion without reported diagnosis    COVID 05/2021   Crohn's disease (HCC)    Depression    Eating disorder    GERD (gastroesophageal reflux disease)    Kidney stones    Osteoporosis    Short gut syndrome    Past Surgical History:  Procedure Laterality Date   COLONOSCOPY  06/11/2020   COLONOSCOPY  07/05/2018   COLONOSCOPY WITH PROPOFOL  N/A 09/17/2017   Procedure: COLONOSCOPY WITH PROPOFOL ;  Surgeon: Shila Gustav GAILS, MD;  Location: [facility];  Service: Endoscopy;  Laterality: N/A;  Through ileostomy.   CYSTOSCOPY WITH STENT PLACEMENT Left 05/20/2017   Procedure: CYSTOSCOPY WITH STENT PLACEMENT;  Surgeon: Cam Morene ORN, MD;  Location: [facility];  Service: Urology;  Laterality: Left;   ILEOSTOMY     IR NEPHROSTOMY EXCHANGE LEFT  04/12/2017   IR NEPHROSTOMY PLACEMENT LEFT  04/12/2017   IR NEPHROSTOMY PLACEMENT LEFT  04/29/2017   NEPHROLITHOTOMY Left 05/14/2017   Procedure: LEFT NEPHROLITHOTOMY PERCUTANEOUS WITH SURGEON ACCESS;  Surgeon: Cam Morene ORN, MD;  Location: [facility];  Service: Urology;  Laterality: Left;   SMALL INTESTINE SURGERY     SUBTOTAL COLECTOMY       Prior to Admission medications    Medication Sig Start Date End Date Taking? Authorizing Provider  albuterol  (PROVENTIL ) (5 MG/ML) 0.5% nebulizer solution Take 0.5 mLs (2.5 mg total) by nebulization every 6 (six) hours as needed for wheezing or shortness of breath. 06/24/24  Yes Donnajean Lynwood DEL, PA-C  acetaminophen  (TYLENOL ) 500 MG tablet Take 500 mg by mouth every 6 (six) hours as needed for moderate pain.    [provider]  calcium  carbonate (TUMS - DOSED IN MG ELEMENTAL CALCIUM ) 500 MG chewable tablet Chew 1 tablet by mouth 2 (two) times daily.    [provider]  cholecalciferol  (VITAMIN D3) 25 MCG (1000 UNIT) tablet Take 5,000 Units by mouth daily.    [provider]  COPPER  PO Take 4 mg by mouth daily.    [provider]  cyanocobalamin  (VITAMIN B12) 1000 MCG/ML injection Inject 1 mL (1,000 mcg total) into the muscle every 30 (thirty) days. 03/23/24   Lonn Hicks, MD  dicyclomine  (BENTYL ) 10 MG capsule Take 1 capsule (10 mg total) by mouth every 8 (eight) hours as needed (abdominal cramping). 05/15/24   Armbruster, Elspeth SQUIBB, MD  diphenoxylate -atropine  (LOMOTIL ) 2.5-0.025 MG tablet Take 1 tablet by mouth 4 (four) times daily as needed for diarrhea or loose stools. 03/28/24   Armbruster, Elspeth SQUIBB, MD  ferrous sulfate  325 (65 FE) MG EC tablet Take 325 mg by mouth daily with breakfast.  [provider]  magnesium  (MAGTAB) 84 MG ( ) TBCR SR tablet Take 84 mg by mouth daily.    [provider]  Multiple Vitamin (MULTIVITAMIN WITH MINERALS) TABS tablet Take 1 tablet by mouth daily. 05/03/17   Rai, Nydia POUR, MD  Na Sulfate-K Sulfate-Mg Sulfate concentrate (SUPREP) 17.5-3.13-1.6 GM/177ML SOLN Use as directed; may use generic; goodrx card if insurance will not cover generic 03/13/24   Armbruster, Elspeth SQUIBB, MD  omeprazole  (PRILOSEC) 20 MG capsule Take 1 capsule (20 mg total) by mouth daily. Please keep your July appointment for any further refills. Thank you 03/01/24   Armbruster,  Elspeth SQUIBB, MD  ondansetron  (ZOFRAN ) 4 MG tablet Take 1 tablet (4 mg total) by mouth every 8 (eight) hours as needed for nausea or vomiting. Office visit for further refills 10/22/23   Armbruster, Elspeth SQUIBB, MD  potassium chloride  (KLOR-CON ) 20 MEQ packet Take 20 mEq by mouth daily. 12/17/20   Armbruster, Elspeth SQUIBB, MD  sertraline  (ZOLOFT ) 50 MG tablet Take 1 tablet (50 mg total) by mouth daily. 08/26/17   Mercer Clotilda SAUNDERS, MD  sildenafil  (VIAGRA ) 25 MG tablet Take 0.5 tablets (12.5 mg total) by mouth daily as needed for erectile dysfunction. 05/31/23   Mercer Clotilda SAUNDERS, MD  SKYRIZI  360 MG/2.4ML SOCT Inject 360 mg into the skin every 8 (eight) weeks. Start 4 weeks after the last Skyrizi  infusion. 08/08/24   Armbruster, Elspeth SQUIBB, MD  zinc  gluconate 50 MG tablet Take 50 mg by mouth daily.    [provider]    Allergies: Methotrexate  and trimetrexate, Phenergan  [promethazine  hcl], Humira [adalimumab], Penicillins, and Remicade [infliximab]    Review of Systems  Cardiovascular:  Positive for chest pain.    Updated Vital Signs BP 101/71   Pulse 83   Temp 98.9 F (37.2 C) (Oral)   Resp 17   SpO2 100%   Physical Exam Vitals and nursing note reviewed.  Constitutional:      General: He is not in acute distress.    Appearance: He is well-developed.  HENT:     Head: Normocephalic and atraumatic.  Eyes:     Conjunctiva/sclera: Conjunctivae normal.  Cardiovascular:     Rate and Rhythm: Normal rate and regular rhythm.     Heart sounds: No murmur heard. Pulmonary:     Effort: Pulmonary effort is normal. No respiratory distress.     Breath sounds: Normal breath sounds.  Chest:     Chest wall: Tenderness present.  Abdominal:     Palpations: Abdomen is soft.     Tenderness: There is no abdominal tenderness.  Musculoskeletal:        General: No swelling.     Cervical back: Neck supple.  Skin:    General: Skin is warm and dry.     Capillary Refill: Capillary refill takes less than 2  seconds.  Neurological:     Mental Status: He is alert.  Psychiatric:        Mood and Affect: Mood normal.     (all labs ordered are listed, but only abnormal results are displayed) Labs Reviewed  BASIC METABOLIC PANEL WITH GFR - Abnormal; Notable for the following components:      Result Value   Potassium 3.4 (*)    Glucose, Bld 124 (*)    Creatinine, Ser 1.51 (*)    GFR, Estimated 52 (*)    All other components within normal limits  CBC - Abnormal; Notable for the following components:   WBC 12.8 (*)  Hemoglobin 12.3 (*)    MCHC 29.9 (*)    Platelets 482 (*)    All other components within normal limits  TROPONIN T, HIGH SENSITIVITY    EKG: EKG Interpretation Date/Time:  Saturday June 24 2024 17:50:48 EDT Ventricular Rate:  77 PR Interval:  140 QRS Duration:  105 QT Interval:  397 QTC Calculation: 450 R Axis:   81  Text Interpretation: Sinus rhythm Borderline right axis deviation Confirmed by Garrick Charleston [phone]) on 06/24/2024 6:06:49 PM  Radiology: DG Chest 2 View Result Date: 06/24/2024 CLINICAL DATA:  Chest pain.  Shortness of breath. EXAM: CHEST - 2 VIEW COMPARISON:  Radiograph 05/23/2021 FINDINGS: Hyperinflation with bullous emphysema. No confluent opacity. Normal heart size and mediastinal contours. No pneumothorax, pleural effusion or pulmonary edema. No acute osseous findings. IMPRESSION: Hyperinflation with bullous emphysema.  No acute findings. Electronically Signed   By: Andrea Gasman M.D.   On: 06/24/2024 16:49     Procedures   Medications Ordered in the ED  albuterol  (VENTOLIN  HFA) 108 (90 Base) MCG/ACT inhaler 1 puff (1 puff Inhalation Given 06/24/24 1840)    Clinical Course as of 06/24/24 1842  Sat Jun 24, 2024  1659 Patient with COPD and recent history of bronchitis, covered with Cipro  evaluated for complaints of pleuritic chest pain x 2 days.  He is hemodynamically stable.  On exam his lungs are clear.  He does have reproducible  central sternal tenderness.  Will obtain routine labs and chest x-ray. [JT]  1700 DG Chest 2 View Consistent with bullous emphysema, no acute findings [JT]  1700 CBC(!) Leukocytosis 12.8 [JT]  1720 Troponin T, High Sensitivity Within normal limit [JT]  1720 Basic metabolic panel(!) Creatinine 1.5 near baseline [JT]  1755 EKG 12-Lead Sinus rhythm without ischemic change [JT]  1808 Workup is overall reassuring.  Patient will be discharged home.  He is not hypoxic, his lungs are clear and he has no increased respiratory effort.  Likely etiology includes bronchitis, costochondritis.  Will send a prescription for prednisone  and albuterol  inhaler.  Encouraged to follow-up with PCP for further management. [JT]    Clinical Course User Index [JT] Donnajean Lynwood DEL, PA-C                                 Medical Decision Making Amount and/or Complexity of Data Reviewed Labs: ordered. Radiology: ordered.   This patient presents to the ED with chief complaint(s) of chest pain.  The complaint involves an extensive differential diagnosis and also carries with it a high risk of complications and morbidity.   Pertinent past medical history as listed in HPI  The differential diagnosis includes  Do not suspect ACS as patient is without any EKG changes and her troponin is without elevation.  He is without any risk factors for PE.  He is additionally is not tachycardic or hypoxic.  His pain is nonexertional but reproducible with movement and palpation.  Exam and history not consistent with aortic dissection.  No evidence of pneumonia on x-ray.  He is additionally afebrile without significant cough.  Additional history obtained: Records reviewed Care Everywhere/External Records  Disposition:   Patient will be discharged home. The patient has been appropriately medically screened and/or stabilized in the ED. I have low suspicion for any other emergent medical condition which would require further  screening, evaluation or treatment in the ED or require inpatient management. At time of discharge the patient is  hemodynamically stable and in no acute distress. I have discussed work-up results and diagnosis with patient and answered all questions. Patient is agreeable with discharge plan. We discussed strict return precautions for returning to the emergency department and they verbalized understanding.     Social Determinants of Health:   none  This note was dictated with voice recognition software.  Despite best efforts at proofreading, errors may have occurred which can change the documentation meaning.       Final diagnoses:  Atypical chest pain    ED Discharge Orders          Ordered    albuterol  (PROVENTIL ) (5 MG/ML) 0.5% nebulizer solution  Every 6 hours PRN        06/24/24 1830               Donnajean Lynwood VEAR DEVONNA 06/24/24 1845    Garrick Charleston, MD 06/24/24 2243

## 2024-06-24 NOTE — Discharge Instructions (Addendum)
 You were evaluated in the emergency room for chest pain.  Your lab work and imaging did not show any significant abnormality.  Your x-ray is most consistent with COPD which is a chronic lung disease often caused by smoking.  A prescription for albuterol  for your nebulizer was sent into your pharmacy.  Please call the number on the sheet to follow-up with the pulmonologist.  Your chest pain is most consistent with a musculoskeletal etiology such as costochondritis.  You may use ibuprofen and Tylenol  for this.  I recommend taking 600-800 mg of ibuprofen every 4-6 hours up to 3200 mg a day on a full stomach.  Please follow-up with your primary care doctor as well.

## 2024-06-25 ENCOUNTER — Ambulatory Visit: Payer: Self-pay | Admitting: Family Medicine

## 2024-06-26 ENCOUNTER — Telehealth (HOSPITAL_COMMUNITY): Payer: Self-pay | Admitting: Emergency Medicine

## 2024-06-26 MED ORDER — ALBUTEROL SULFATE (2.5 MG/3ML) 0.083% IN NEBU: 2.5000 mg | INHALATION_SOLUTION | Freq: Four times a day (QID) | RESPIRATORY_TRACT | 1 refills | Status: AC | PRN

## 2024-06-26 NOTE — Telephone Encounter (Signed)
 Change in strength of medicine.

## 2024-06-26 NOTE — ED Notes (Signed)
 06/26/24 1650 pt called stating the pharmacy did not have his medication. Writer spoke with Dr Patsey who clarified order and resent the medication. Writer verified with pharmacy it is in stock and pt can pick med up in about 30 min. Pt has been made aware of the medication being sent to pharmacy.

## 2024-06-28 NOTE — Telephone Encounter (Signed)
Patient was seen in the ED. 

## 2024-07-03 ENCOUNTER — Telehealth: Payer: Self-pay | Admitting: Gastroenterology

## 2024-07-03 NOTE — Telephone Encounter (Signed)
 Called [facility].  They need an OV from 2023 that shows he had a colostomy and needed supplies.  His ostomy supplies starting in 2021 are being audited. Patient was seen in June and October of 2023.  Faxed OV from 01-2022 to Fax [phone]

## 2024-07-03 NOTE — Telephone Encounter (Signed)
 Inbound call from [place] with [employer] calling to requesting clinical notes from 2023 indicating that this patient is needing refills on there ostomy bag supplies. Gregory [name] is requesting a call back at [phone]. Please advise.

## 2024-07-05 ENCOUNTER — Encounter: Payer: Self-pay | Admitting: Gastroenterology

## 2024-07-11 ENCOUNTER — Ambulatory Visit

## 2024-07-11 VITALS — BP 101/66 | HR 74 | Temp 98.7°F | Resp 14 | Ht 71.0 in | Wt 119.8 lb

## 2024-07-11 DIAGNOSIS — K50818 Crohn's disease of both small and large intestine with other complication: Secondary | ICD-10-CM | POA: Diagnosis not present

## 2024-07-11 MED ORDER — SODIUM CHLORIDE 0.9 % IV SOLN
600.0000 mg | Freq: Once | INTRAVENOUS | Status: AC
  Administered 2024-07-11: 600 mg via INTRAVENOUS
  Filled 2024-07-11: qty 10

## 2024-07-11 NOTE — Progress Notes (Signed)
 Diagnosis: Crohn's Disease  Provider:  Praveen Mannam MD  Procedure: IV Infusion  IV Type: Peripheral, IV Location: L Hand  Skyrizi  (risankizumab -rzaa), Dose: 600 mg  Infusion Start Time: 1213  Infusion Stop Time: 1321  Post Infusion IV Care: Peripheral IV Discontinued  Discharge: Condition: Good, Destination: Home . AVS Declined  Performed by:  Kenedee Molesky, RN

## 2024-07-12 ENCOUNTER — Encounter: Payer: Self-pay | Admitting: Gastroenterology

## 2024-07-12 NOTE — Telephone Encounter (Signed)
 Spoke with [employer] and they stated that with insurance the feeding goes under durable medical equipment. Medicare does not cover oral nutrition, it would have to be for tube feeding to be covered.  She provided a number for you to get cash pay information that may be cheaper than they can provide it. The company is called [employer] [phone] and they would also be able to set up shipping for you. Please let us  know if we can help you with anything else.  Sincerely, [facility]

## 2024-07-13 ENCOUNTER — Other Ambulatory Visit (HOSPITAL_COMMUNITY): Payer: Self-pay

## 2024-07-13 NOTE — Telephone Encounter (Signed)
 He is not required to fill at [facility] if that is not where he wants to go. Keep in mind that I do not know if other pharmacies will be included or excluded in his coverage or if they will or will not change his current co-pay of $0.00.

## 2024-07-13 NOTE — Telephone Encounter (Signed)
 Left detailed message for skyrizi  nurse regarding approval dates for skyrizi :  Prior Authorization for Skyrizi  360MG /2.4ML (150MG /ML) single-dose prefilled cartridge with on-body injector has been APPROVED from 05-04-2024 to 11-01-2024

## 2024-07-14 ENCOUNTER — Other Ambulatory Visit: Payer: Self-pay

## 2024-07-17 ENCOUNTER — Telehealth: Payer: Self-pay | Admitting: Gastroenterology

## 2024-07-17 ENCOUNTER — Encounter: Payer: Self-pay | Admitting: Family Medicine

## 2024-07-17 ENCOUNTER — Ambulatory Visit: Payer: Self-pay

## 2024-07-17 ENCOUNTER — Ambulatory Visit: Admitting: Family Medicine

## 2024-07-17 ENCOUNTER — Other Ambulatory Visit: Payer: Self-pay | Admitting: Gastroenterology

## 2024-07-17 VITALS — BP 96/54 | HR 105 | Temp 98.7°F | Ht 71.0 in | Wt 117.4 lb

## 2024-07-17 DIAGNOSIS — K50818 Crohn's disease of both small and large intestine with other complication: Secondary | ICD-10-CM

## 2024-07-17 DIAGNOSIS — R300 Dysuria: Secondary | ICD-10-CM

## 2024-07-17 DIAGNOSIS — R339 Retention of urine, unspecified: Secondary | ICD-10-CM

## 2024-07-17 DIAGNOSIS — J439 Emphysema, unspecified: Secondary | ICD-10-CM

## 2024-07-17 LAB — POC URINALSYSI DIPSTICK (AUTOMATED)
Bilirubin, UA: POSITIVE
Blood, UA: POSITIVE
Glucose, UA: NEGATIVE
Ketones, UA: NEGATIVE
Leukocytes, UA: NEGATIVE
Nitrite, UA: NEGATIVE
Protein, UA: POSITIVE — AB
Spec Grav, UA: 1.02 (ref 1.010–1.025)
Urobilinogen, UA: 0.2 U/dL
pH, UA: 6 (ref 5.0–8.0)

## 2024-07-17 MED ORDER — ALBUTEROL SULFATE HFA 108 (90 BASE) MCG/ACT IN AERS: 2.0000 | INHALATION_SPRAY | Freq: Four times a day (QID) | RESPIRATORY_TRACT | 2 refills | Status: AC | PRN

## 2024-07-17 NOTE — Progress Notes (Unsigned)
 Established Patient Office Visit   Subjective  Patient ID: Gregory [name]., male    DOB: 02-15-1961  Age: 63 y.o. MRN: [id number]  Chief Complaint  Patient presents with  . Acute Visit    Pt is a 63 yo male seen for acute concern.  Pt with   He has experienced persistent fatigue and dehydration since starting Skyrizi  infusions in September, with the last infusion occurring last Tuesday. He notes difficulty with needle insertion due to weight loss and possible dehydration. He describes foul-smelling urine with difficulty urinating, suspecting a urinary infection. He experiences fluctuating energy levels, feeling okay for a few hours before needing to lie down again.  He experienced chest tightness and a sensation of pressure on his heart after taking antibiotics for bronchitis, leading to an emergency room visit where he was diagnosed with COPD. He was a smoker 20 years ago. He was given an albuterol  inhaler and a nebulizer solution at the ER. He reports no energy, spending most of his time lying down, and running a fever of 44F last night.  His appetite has decreased, and his weight has dropped to 117 pounds. He has started consuming Ensure and drinking more water to improve his nutrition and hydration. His bowel movements have slowed down since starting Skyrizi , although they remain watery unless he eats well.  He has a history of intestinal inflammation, which improved with Skyrizi . He has previously failed treatments with Remicade and Humira. He reports a past intestinal tear that prevented a colonoscopy, leading to the switch to Skyrizi .  He experiences restlessness and difficulty sleeping, feeling frustrated by his inability to perform daily activities like cleaning his house. He lives alone and is lactose intolerant, opting for plant-based nutritional supplements. He recalls a previous blood draw indicating poor kidney function due to inadequate water consumption.    Patient  Active Problem List   Diagnosis Date Noted  . Zinc  deficiency 03/23/2024  . Vitamin B12 deficiency 03/16/2024  . Hypocalcemia 09/16/2023  . 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 Irritant contact dermatitis associated with fecal stoma   . Fissure in ano   . Colostomy complication (HCC)   . Other osteoporosis without current pathological fracture 12/01/2021  . Vitamin D  deficiency 12/01/2021  . COVID-19 virus infection 05/23/2021  . Gastroenteritis due to COVID-19 virus   . Crohn's disease (HCC) 05/22/2021  . Exacerbation of Crohn's disease of small intestine (HCC) 12/19/2019  . Crohn's disease of both small and lg int w unsp comp (HCC) 01/17/2018  . Dehydration 01/17/2018  . Hypokalemia 09/15/2017  . AKI (acute kidney injury) 09/15/2017  . Hypomagnesemia 09/15/2017  . Deficiency anemia 05/18/2017  . Protein-calorie malnutrition, severe 04/30/2017  . Anuresis 04/28/2017  . Physical deconditioning 04/28/2017  . Protein calorie malnutrition 04/28/2017  . Abdominal pain, left upper quadrant 04/28/2017  . GERD (gastroesophageal reflux disease) 04/28/2017  . 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  . Depression 04/28/2017  . Nausea & vomiting   . Crohn's disease of both small and large intestine without complication (HCC)   . Nephrolithiasis 04/10/2017   Past Medical History:  Diagnosis Date  . Acute endocarditis   . Blood transfusion without reported diagnosis   . COVID 05/2021  . Crohn's disease (HCC)   . Depression   . Eating disorder   . GERD (gastroesophageal reflux disease)   . Kidney stones   . Osteoporosis   . Short gut syndrome    Past Surgical History:  Procedure Laterality Date  . COLONOSCOPY  06/11/2020  .  COLONOSCOPY  07/05/2018  . COLONOSCOPY WITH PROPOFOL  N/A 09/17/2017   Procedure: COLONOSCOPY WITH PROPOFOL ;  Surgeon: Shila Gustav GAILS, MD;  Location: [facility];  Service: Endoscopy;  Laterality: N/A;  Through ileostomy.  SABRA GRIMES WITH STENT PLACEMENT Left  05/20/2017   Procedure: CYSTOSCOPY WITH STENT PLACEMENT;  Surgeon: Cam Morene ORN, MD;  Location: [facility];  Service: Urology;  Laterality: Left;  . ILEOSTOMY    . IR NEPHROSTOMY EXCHANGE LEFT  04/12/2017  . IR NEPHROSTOMY PLACEMENT LEFT  04/12/2017  . IR NEPHROSTOMY PLACEMENT LEFT  04/29/2017  . NEPHROLITHOTOMY Left 05/14/2017   Procedure: LEFT NEPHROLITHOTOMY PERCUTANEOUS WITH SURGEON ACCESS;  Surgeon: Cam Morene ORN, MD;  Location: [facility];  Service: Urology;  Laterality: Left;  . SMALL INTESTINE SURGERY    . SUBTOTAL COLECTOMY     Social History   Tobacco Use  . Smoking status: Former    Current packs/day: 0.00    Average packs/day: 1 pack/day for 20.0 years (20.0 ttl pk-yrs)    Types: Cigarettes    Start date: 08/31/1986    Quit date: 08/31/2006    Years since quitting: 17.8  . Smokeless tobacco: Never  Vaping Use  . Vaping status: Never Used  Substance Use Topics  . Alcohol use: Yes    Comment: occ  . Drug use: No   Family History  Problem Relation Age of Onset  . Heart disease Mother   . Diabetes Father   . Heart disease Father   . Heart attack Father   . Cancer Sister   . Heart disease Sister   . Melanoma Sister   . Anemia Sister   . Crohn's disease Brother   . Inflammatory bowel disease Brother   . Diabetes Paternal Grandmother   . Colon cancer Neg Hx   . Rectal cancer Neg Hx   . Stomach cancer Neg Hx   . Esophageal cancer Neg Hx    Allergies  Allergen Reactions  . Methotrexate  And Trimetrexate Other (See Comments)    Anemia, low WBC, severe GI symptoms,   . Phenergan  [Promethazine  Hcl] Other (See Comments)    twitching  . Humira [Adalimumab] Other (See Comments)    Intolerance  . Penicillins Other (See Comments)    REACTION DURING SURGERY Has patient had a PCN reaction causing immediate rash, facial/tongue/throat swelling, SOB or lightheadedness with hypotension: Unknown Has patient had a PCN reaction causing severe rash involving mucus membranes or  skin necrosis: Unknown Has patient had a PCN reaction that required hospitalization: Unknown Has patient had a PCN reaction occurring within the last 10 years: No If all of the above answers are NO, then may proceed with Cephalosporin use.   . Remicade [Infliximab] Other (See Comments)    Intolerance    ROS Negative unless stated above    Objective:     BP (!) 96/54 (BP Location: Left Arm, Patient Position: Sitting, Cuff Size: Normal)   Pulse (!) 105   Temp 98.7 F (37.1 C) (Oral)   Ht 5' 11 (1.803 m)   Wt 117 lb 6.4 oz (53.3 kg)   SpO2 97%   BMI 16.37 kg/m  BP Readings from Last 3 Encounters:  07/17/24 (!) 96/54  07/11/24 101/66  06/24/24 96/62   Wt Readings from Last 3 Encounters:  07/17/24 117 lb 6.4 oz (53.3 kg)  07/11/24 119 lb 12.8 oz (54.3 kg)  06/15/24 124 lb (56.2 kg)      Physical Exam Constitutional:      General: He  is not in acute distress.    Appearance: Normal appearance. He is underweight. He is not toxic-appearing.     Comments: Temporal wasting.  HENT:     Head: Normocephalic and atraumatic.     Nose: Nose normal.     Mouth/Throat:     Mouth: Mucous membranes are moist.  Cardiovascular:     Rate and Rhythm: Normal rate and regular rhythm.     Heart sounds: Normal heart sounds. No murmur heard.    No gallop.  Pulmonary:     Effort: Pulmonary effort is normal. No respiratory distress.     Breath sounds: Normal breath sounds. No wheezing, rhonchi or rales.  Skin:    General: Skin is warm and dry.  Neurological:     Mental Status: He is alert and oriented to person, place, and time.  Psychiatric:        Behavior: Behavior is cooperative.        06/15/2024   11:45 AM 08/30/2023   11:44 AM 05/31/2023    1:25 PM  Depression screen PHQ 2/9  Decreased Interest 0 0 0  Down, Depressed, Hopeless 0 0 0  PHQ - 2 Score 0 0 0  Altered sleeping 3  3  Tired, decreased energy 3  3  Change in appetite 3  3  Feeling bad or failure about  yourself  0  0  Trouble concentrating 0  0  Moving slowly or fidgety/restless 0  0  Suicidal thoughts   0  PHQ-9 Score 9   9   Difficult doing work/chores   Somewhat difficult     Data saved with a previous flowsheet row definition      06/15/2024   11:45 AM 05/31/2023    1:25 PM  GAD 7 : Generalized Anxiety Score  Nervous, Anxious, on Edge 2 1  Control/stop worrying 0 0  Worry too much - different things 0 0  Trouble relaxing 3 0  Restless 2 0  Easily annoyed or irritable 2 0  Afraid - awful might happen 3 0  Total GAD 7 Score 12 1  Anxiety Difficulty Somewhat difficult Not difficult at all     Results for orders placed or performed in visit on 07/17/24  POCT Urinalysis Dipstick (Automated)  Result Value Ref Range   Color, UA dark yellow    Clarity, UA cloudy    Glucose, UA Negative Negative   Bilirubin, UA pos    Ketones, UA neg    Spec Grav, UA 1.020 1.010 - 1.025   Blood, UA pos    pH, UA 6.0 5.0 - 8.0   Protein, UA Positive (A) Negative   Urobilinogen, UA 0.2 0.2 or 1.0 E.U./dL   Nitrite, UA neg    Leukocytes, UA Negative Negative      Assessment & Plan:   Dysuria -     POCT Urinalysis Dipstick (Automated) -     Urine Culture; Future  Urine retention -     POCT Urinalysis Dipstick (Automated) -     Urine Culture; Future    No follow-ups on file.   Clotilda JONELLE Single, MD

## 2024-07-17 NOTE — Telephone Encounter (Signed)
 Patient called stating he believes he has a urinary infections. Requesting a call to discuss further. Please advise, thank you

## 2024-07-17 NOTE — Telephone Encounter (Signed)
 FYI Only or Action Required?: FYI only for provider: appointment scheduled on 07/17/2024.  Patient was last seen in primary care on 06/15/2024 by Gregory Clotilda SAUNDERS, MD.  Called Nurse Triage reporting Urinary Retention.  Symptoms began 1.5 weeks.  Interventions attempted: Nothing.  Symptoms are: gradually worsening.  Triage Disposition: See Physician Within 24 Hours  Patient/caregiver understands and will follow disposition?: Yes      Copied from CRM #[id number]. Topic: Clinical - Red Word Triage >> Jul 17, 2024  1:12 PM Nessti S wrote: [facility] that prompted transfer to Nurse Triage: pain in back while problem with urinating and foul smell from urine   ----------------------------------------------------------------------- From previous Reason for Contact - Scheduling: Patient/patient representative is calling to schedule an appointment. Refer to attachments for appointment information. Reason for Disposition  Pus (white, yellow) or bloody discharge from end of penis  Answer Assessment - Initial Assessment Questions 1. SEVERITY: How bad is the pain?  (e.g., Scale 1-10; mild, moderate, or severe)     Moderate  2. FREQUENCY: How many times have you had painful urination today?      Each  3. PATTERN: Is pain present every time you urinate or just sometimes?      constant 4. ONSET: When did the painful urination start?      X 1.5 weeks 5. FEVER: Do you have a fever? If Yes, ask: What is your temperature, how was it measured, and when did it start?     Low grad 6. PAST UTI: Have you had a urine infection before? If Yes, ask: When was the last time? and What happened that time?      yes 7. CAUSE: What do you think is causing the painful urination?      UTI 8. OTHER SYMPTOMS: Do you have any other symptoms? (e.g., flank pain, penis discharge, scrotal pain, blood in urine)     Urine smell, painful urination, low back pain+  Protocols used: Urination Pain -  Male-A-AH

## 2024-07-17 NOTE — Telephone Encounter (Signed)
Appt 11/17

## 2024-07-17 NOTE — Telephone Encounter (Signed)
 Yes that is fine

## 2024-07-18 ENCOUNTER — Encounter: Payer: Self-pay | Admitting: Family Medicine

## 2024-07-18 LAB — URINE CULTURE
MICRO NUMBER:: 17244225
Result:: NO GROWTH
SPECIMEN QUALITY:: ADEQUATE

## 2024-07-18 NOTE — Telephone Encounter (Signed)
 See  notes below. Pt no longer needs script for [employer] protein drink:  Braylan D Mamula Jr. [facility] Clinical Pool (supporting Elspeth SHAUNNA Naval, MD)     07/17/24  7:54 AM [employer] [facility] Friend found this ensure plant based protein drink at [facility]. It's only 11.79 and is exact same thing as Mallie Pinion but so much cheaper. I can afford this it's also Lactose free. I'm gonna drink one once daily to see how it does. Sorry for the inconvenience but I'm happy my friend found this one. Thank you  Sincerely Meldrick Mura

## 2024-07-19 ENCOUNTER — Ambulatory Visit: Payer: Self-pay | Admitting: Family Medicine

## 2024-07-20 ENCOUNTER — Other Ambulatory Visit: Payer: Self-pay

## 2024-07-20 ENCOUNTER — Other Ambulatory Visit (HOSPITAL_COMMUNITY): Payer: Self-pay

## 2024-07-20 NOTE — Progress Notes (Signed)
 Specialty Pharmacy Initiation Note   Gregory D Quest Jr. is a 63 y.o. male who will be followed by the specialty pharmacy service for RxSp Crohn's Disease    Review of administration, indication, effectiveness, safety, potential side effects, storage/disposable, and missed dose instructions occurred today for patient's specialty medication(s) Risankizumab -rzaa (Skyrizi )     Patient/Caregiver did not have any additional questions or concerns.   Patient's therapy is appropriate to: Continue (Patient has received initial infusion doses, transitioning to on body injector.)    Goals Addressed             This Visit's Progress    Stabilization of disease       Patient is not on track and improving. Patient will maintain adherence. Patient started initial Skyrizi  infusions and and is transitioning to on body injector. He has seen positive results so far from infusions.          Nishika Parkhurst M Peggy Monk Specialty Pharmacist

## 2024-07-20 NOTE — Progress Notes (Signed)
 Specialty Pharmacy Initial Fill Coordination Note  Gregory D Henson Jr. is a 63 y.o. male contacted today regarding initial fill of specialty medication(s) Risankizumab -rzaa (Skyrizi )   Patient requested Delivery   Delivery date: 08/03/24   Verified address: [address]   [place], [place] [address]   Medication will be filled on: 08/02/24   Patient is aware of $0.00 copayment.

## 2024-07-25 ENCOUNTER — Other Ambulatory Visit: Payer: Self-pay

## 2024-08-02 ENCOUNTER — Other Ambulatory Visit: Payer: Self-pay

## 2024-08-10 ENCOUNTER — Other Ambulatory Visit: Payer: Self-pay | Admitting: Gastroenterology

## 2024-08-17 ENCOUNTER — Ambulatory Visit: Admitting: "Endocrinology

## 2024-08-17 ENCOUNTER — Encounter: Payer: Self-pay | Admitting: "Endocrinology

## 2024-08-17 ENCOUNTER — Other Ambulatory Visit

## 2024-08-17 VITALS — BP 100/80 | HR 74 | Ht 71.0 in | Wt 123.0 lb

## 2024-08-17 DIAGNOSIS — M818 Other osteoporosis without current pathological fracture: Secondary | ICD-10-CM

## 2024-08-17 NOTE — Progress Notes (Signed)
 [facility] Note Gregory Birmingham, MD    Referring Provider: Leigh Elspeth SQUIBB, MD Primary Care Provider: Mercer Clotilda SAUNDERS, MD No chief complaint on file.    Assessment & Plan  Diagnoses and all orders for this visit:  Other osteoporosis without current pathological fracture -     VITAMIN D  25 Hydroxy (Vit-D Deficiency, Fractures) -     Renal function panel     Osteoporosis Likely secondary cause from malabsorption due to crohn's. 01/2023 BMD results suggest: T score of -3 at left femoral neck and and -2.6 at right femoral neck. Recommend to use calcium  600 mg twice daily and vitamin D  2000 units OTC supplements.  Discussed weight bearing exercise options and dietary supplements. Will evaluate for secondary causes of osteoporosis.  Educated on risks and side effects of fosamax and prolia including but not limited to esophagitis, worsening GERD, atypical femoral fractures and osteonecrosis of the jaw. Advised to take medication first thing in the morning with plenty of water and stay upright for 30 minutes after taking the medication.  Patient has no teeth at all, predominantly eats liquid diet.  Discussed about risk of osteonecrosis of the jaw patient understands.  Patient does not want any IV shots or infusions. Ordered labs now, will follow-up with Fosamax based on labs.  Patient gets recurrent Crohn's disease flares, has had intestinal surgeries, has a colostomy and eats liquid diet (teeth taken out back in 1980s as a treatment, later diagnosed of crohn's);  Advised fall precautions, adequate dairy in diet and exercises (aerobic, balancing and weight bearing) as tolerated.   Return in about 3 months (around 11/17/2024) for visit, labs today.  I have reviewed current medications, nurse's notes, allergies, vital signs, past medical and surgical history, family medical history, and social history for this encounter. Counseled patient on symptoms, examination  findings, lab findings, imaging results, treatment decisions and monitoring and prognosis. The patient understood the recommendations and agrees with the treatment plan. All questions regarding treatment plan were fully answered.   Gregory Birmingham, MD   08/17/2024    History of Present Illness Gregory D Hauger Jr. is a 63 y.o. year old male who presents to our clinic with osteoporosis diagnosed in 2019.  BMD completed on 01/2023 suggested osteoporosis. He is currently NOT taking Calcium  or vitamin D .  Risk Factors screening:  History of low trauma fractures: No (broke rib after fell from a ladder) Family history of osteoporosis: No Hip fracture in first-degree relatives: No Smoking history: No Excessive alcohol intake >2 drinks/day: No Excessive caffeine intake >2 drinks/day: No Glucocorticoid use >5mg  prednisone /day for >3 months: No Rheumatoid arthritis history: No   Anti-epileptic drugs No  Celiac disease/signs of malabsorption YES; has crohn's disease  Gastric bypass/gastrectomy No, has had intestinal surgery, has colostomy bag   PPI use No  TZD use No  Gonadotropin/androgen suppressing drugs No  Aromatase Inhibitors No  Thyroid  hormone suppressive therapy No  Physical Exam  BP 100/80   Pulse 74   Ht 5' 11 (1.803 m)   Wt 123 lb (55.8 kg)   SpO2 98%   BMI 17.16 kg/m  Constitutional: well developed, well nourished Head: normocephalic, atraumatic Eyes: sclera anicteric, no redness Neck: supple Lungs: normal respiratory effort Neurology: alert and oriented Skin: dry, no appreciable rashes Musculoskeletal: no appreciable defects Psychiatric: normal mood and affect  Allergies Allergies[1]  Current Medications Patient's Medications  New Prescriptions   No medications on file  Previous Medications   ACETAMINOPHEN  (TYLENOL )  500 MG TABLET    Take 500 mg by mouth every 6 (six) hours as needed for moderate pain.   ALBUTEROL  (PROVENTIL ) (2.5 MG/3ML) 0.083% NEBULIZER  SOLUTION    Take 3 mLs (2.5 mg total) by nebulization every 6 (six) hours as needed for wheezing or shortness of breath.   ALBUTEROL  (VENTOLIN  HFA) 108 (90 BASE) MCG/ACT INHALER    Inhale 2 puffs into the lungs every 6 (six) hours as needed for wheezing or shortness of breath.   CALCIUM  CARBONATE (TUMS - DOSED IN MG ELEMENTAL CALCIUM ) 500 MG CHEWABLE TABLET    Chew 1 tablet by mouth 2 (two) times daily.   CHOLECALCIFEROL  (VITAMIN D3) 25 MCG (1000 UNIT) TABLET    Take 5,000 Units by mouth daily.   COPPER  PO    Take 4 mg by mouth daily.   CYANOCOBALAMIN  (VITAMIN B12) 1000 MCG/ML INJECTION    Inject 1 mL (1,000 mcg total) into the muscle every 30 (thirty) days.   DICYCLOMINE  (BENTYL ) 10 MG CAPSULE    Take 1 capsule (10 mg total) by mouth every 8 (eight) hours as needed (abdominal cramping).   DIPHENOXYLATE -ATROPINE  (LOMOTIL ) 2.5-0.025 MG TABLET    Take 1 tablet by mouth 4 (four) times daily as needed for diarrhea or loose stools.   FERROUS SULFATE  325 (65 FE) MG EC TABLET    Take 325 mg by mouth daily with breakfast.   MAGNESIUM  (MAGTAB) 84 MG ( ) TBCR SR TABLET    Take 84 mg by mouth daily.   MULTIPLE VITAMIN (MULTIVITAMIN WITH MINERALS) TABS TABLET    Take 1 tablet by mouth daily.   NA SULFATE-K SULFATE-MG SULFATE CONCENTRATE (SUPREP) 17.5-3.13-1.6 GM/177ML SOLN    Use as directed; may use generic; goodrx card if insurance will not cover generic   OMEPRAZOLE  (PRILOSEC) 20 MG CAPSULE    TAKE ONE CAPSULE DAILY   ONDANSETRON  (ZOFRAN ) 4 MG TABLET    TAKE 1 TABLET EVERY 8 HOURS AS NEEDED FOR [place] OR VOMITING   POTASSIUM CHLORIDE  (KLOR-CON ) 20 MEQ PACKET    Take 20 mEq by mouth daily.   SERTRALINE  (ZOLOFT ) 50 MG TABLET    Take 1 tablet (50 mg total) by mouth daily.   SILDENAFIL  (VIAGRA ) 25 MG TABLET    Take 0.5 tablets (12.5 mg total) by mouth daily as needed for erectile dysfunction.   SKYRIZI  360 MG/2.4ML SOCT    Inject 360 mg into the skin every 8 (eight) weeks. Start 4 weeks after the last Skyrizi   infusion.   ZINC  GLUCONATE 50 MG TABLET    Take 50 mg by mouth daily.  Modified Medications   No medications on file  Discontinued Medications   No medications on file     Past Medical History Past Medical History:  Diagnosis Date   Acute endocarditis    Blood transfusion without reported diagnosis    COVID 05/2021   Crohn's disease (HCC)    Depression    Eating disorder    GERD (gastroesophageal reflux disease)    Kidney stones    Osteoporosis    Short gut syndrome     Past Surgical History Past Surgical History:  Procedure Laterality Date   COLONOSCOPY  06/11/2020   COLONOSCOPY  07/05/2018   COLONOSCOPY WITH PROPOFOL  N/A 09/17/2017   Procedure: COLONOSCOPY WITH PROPOFOL ;  Surgeon: Shila Gustav GAILS, MD;  Location: [facility];  Service: Endoscopy;  Laterality: N/A;  Through ileostomy.   CYSTOSCOPY WITH STENT PLACEMENT Left 05/20/2017   Procedure: CYSTOSCOPY WITH STENT PLACEMENT;  Surgeon: Cam Morene ORN, MD;  Location: [facility];  Service: Urology;  Laterality: Left;   ILEOSTOMY     IR NEPHROSTOMY EXCHANGE LEFT  04/12/2017   IR NEPHROSTOMY PLACEMENT LEFT  04/12/2017   IR NEPHROSTOMY PLACEMENT LEFT  04/29/2017   NEPHROLITHOTOMY Left 05/14/2017   Procedure: LEFT NEPHROLITHOTOMY PERCUTANEOUS WITH SURGEON ACCESS;  Surgeon: Cam Morene ORN, MD;  Location: [facility];  Service: Urology;  Laterality: Left;   SMALL INTESTINE SURGERY     SUBTOTAL COLECTOMY      Family History family history includes Anemia in his sister; Cancer in his sister; Crohn's disease in his brother; Diabetes in his father and paternal grandmother; Heart attack in his father; Heart disease in his father, mother, and sister; Inflammatory bowel disease in his brother; Melanoma in his sister.  Social History Social History   Socioeconomic History   Marital status: Divorced    Spouse name: Not on file   Number of children: 2   Years of education: Not on file   Highest education level: Associate degree:  academic program  Occupational History   Occupation: Retired   Occupation: On disability  Tobacco Use   Smoking status: Former    Current packs/day: 0.00    Average packs/day: 1 pack/day for 20.0 years (20.0 ttl pk-yrs)    Types: Cigarettes    Start date: 08/31/1986    Quit date: 08/31/2006    Years since quitting: 17.9   Smokeless tobacco: Never  Vaping Use   Vaping status: Never Used  Substance and Sexual Activity   Alcohol use: Yes    Comment: occ   Drug use: No   Sexual activity: Not Currently  Other Topics Concern   Not on file  Social History Narrative   Not on file   Social Drivers of Health   Tobacco Use: Medium Risk (08/17/2024)   Patient History    Smoking Tobacco Use: Former    Smokeless Tobacco Use: Never    Passive Exposure: Not on Actuary Strain: Low Risk (08/30/2023)   Overall Financial Resource Strain (CARDIA)    Difficulty of Paying Living Expenses: Not hard at all  Food Insecurity: No Food Insecurity (08/30/2023)   Hunger Vital Sign    Worried About Running Out of Food in the Last Year: Never true    Ran Out of Food in the Last Year: Never true  Transportation Needs: No Transportation Needs (08/30/2023)   PRAPARE - Administrator, Civil Service (Medical): No    Lack of Transportation (Non-Medical): No  Physical Activity: Sufficiently Active (08/30/2023)   Exercise Vital Sign    Days of Exercise per Week: 5 days    Minutes of Exercise per Session: 60 min  Stress: No Stress Concern Present (08/30/2023)   [employer] of Occupational Health - Occupational Stress Questionnaire    Feeling of Stress : Not at all  Social Connections: Moderately Integrated (08/30/2023)   Social Connection and Isolation Panel    Frequency of Communication with Friends and Family: More than three times a week    Frequency of Social Gatherings with Friends and Family: More than three times a week    Attends Religious Services: More than 4 times  per year    Active Member of [employer] or Organizations: Yes    Attends Banker Meetings: More than 4 times per year    Marital Status: Divorced  Intimate Partner Violence: Not At Risk (08/30/2023)   Humiliation, Afraid, Rape, and  Kick questionnaire    Fear of Current or Ex-Partner: No    Emotionally Abused: No    Physically Abused: No    Sexually Abused: No  Depression (PHQ2-9): Medium Risk (07/17/2024)   Depression (PHQ2-9)    PHQ-2 Score: 10  Alcohol Screen: Low Risk (08/30/2023)   Alcohol Screen    Last Alcohol Screening Score (AUDIT): 0  Housing: Unknown (08/30/2023)   Housing Stability Vital Sign    Unable to Pay for Housing in the Last Year: No    Number of Times Moved in the Last Year: Not on file    Homeless in the Last Year: No  Utilities: Not At Risk (08/30/2023)   AHC Utilities    Threatened with loss of utilities: No  Health Literacy: Adequate Health Literacy (08/30/2023)   B1300 Health Literacy    Frequency of need for help with medical instructions: Never    Laboratory Investigations No components found for: CMP No components found for: BMP Lab Results  Component Value Date   GFR 54.09 (L) 06/14/2024   Lab Results  Component Value Date   CREATININE 1.51 (H) 06/24/2024   No results found for: CBC No components found for: LFT No components found for: VITD Lab Results  Component Value Date   PTH 38 12/01/2021    Lab Results  Component Value Date   TSH 2.533 06/14/2023    No components found for: RENAL FUNCTION No components found for: MAGNESIUM   Parts of this note may have been dictated using voice recognition software. There may be variances in spelling and vocabulary which are unintentional. Not all errors are proofread. Please notify the dino if any discrepancies are noted or if the meaning of any statement is not clear.      [1]  Allergies Allergen Reactions   Methotrexate  And Trimetrexate Other (See Comments)     Anemia, low WBC, severe GI symptoms,    Phenergan  [Promethazine  Hcl] Other (See Comments)    twitching   Humira [Adalimumab] Other (See Comments)    Intolerance   Penicillins Other (See Comments)    REACTION DURING SURGERY Has patient had a PCN reaction causing immediate rash, facial/tongue/throat swelling, SOB or lightheadedness with hypotension: Unknown Has patient had a PCN reaction causing severe rash involving mucus membranes or skin necrosis: Unknown Has patient had a PCN reaction that required hospitalization: Unknown Has patient had a PCN reaction occurring within the last 10 years: No If all of the above answers are NO, then may proceed with Cephalosporin use.    Remicade [Infliximab] Other (See Comments)    Intolerance

## 2024-08-17 NOTE — Patient Instructions (Signed)
 Recommend to use calcium 600 mg twice daily and vitamin D 2000 units OTC supplements.

## 2024-09-01 ENCOUNTER — Ambulatory Visit

## 2024-09-01 VITALS — Ht 71.0 in | Wt 123.0 lb

## 2024-09-01 DIAGNOSIS — Z0000 Encounter for general adult medical examination without abnormal findings: Secondary | ICD-10-CM | POA: Diagnosis not present

## 2024-09-01 NOTE — Progress Notes (Signed)
 "  Chief Complaint  Patient presents with   Medicare Wellness     Subjective:   Gregory D Habibi Jr. is a 64 y.o. male who presents for a Medicare Annual Wellness Visit.  Visit info / Clinical Intake: Medicare Wellness Visit Type:: Subsequent Annual Wellness Visit Persons participating in visit and providing information:: patient Medicare Wellness Visit Mode:: Telephone If telephone:: video declined Since this visit was completed virtually, some vitals may be partially provided or unavailable. Missing vitals are due to the limitations of the virtual format.: Documented vitals are patient reported If Telephone or Video please confirm:: I connected with patient using audio/video enable telemedicine. I verified patient identity with two identifiers, discussed telehealth limitations, and patient agreed to proceed. Patient Location:: Home Provider Location:: Office Interpreter Needed?: No Pre-visit prep was completed: yes AWV questionnaire completed by patient prior to visit?: yes Date:: 08/01/25 Living arrangements:: (!) lives alone Patient's Overall Health Status Rating: (!) fair Typical amount of pain: some (Followed by medical attention) Does pain affect daily life?: (!) yes (Followed by medical attention) Are you currently prescribed opioids?: (!) yes  Dietary Habits and Nutritional Risks How many meals a day?: 4 Eats fruit and vegetables daily?: (!) no Most meals are obtained by: preparing own meals In the last 2 weeks, have you had any of the following?: none Diabetic:: no  Functional Status Activities of Daily Living (to include ambulation/medication): Independent Ambulation: Independent with device- listed below Home Assistive Devices/Equipment: Eyeglasses; Cane; Dentures (specify type); Nebulizers Medication Administration: Independent Home Management (perform basic housework or laundry): Independent Manage your own finances?: yes Primary transportation is:  driving Concerns about vision?: no *vision screening is required for WTM* Concerns about hearing?: no  Fall Screening Falls in the past year?: 0 Number of falls in past year: 0 Was there an injury with Fall?: 0 Fall Risk Category Calculator: 0 Patient Fall Risk Level: Low Fall Risk  Fall Risk Patient at Risk for Falls Due to: No Fall Risks Fall risk Follow up: Falls evaluation completed  Home and Transportation Safety: All rugs have non-skid backing?: N/A, no rugs All stairs or steps have railings?: yes Grab bars in the bathtub or shower?: yes Have non-skid surface in bathtub or shower?: yes Good home lighting?: yes Regular seat belt use?: yes Hospital stays in the last year:: no  Cognitive Assessment Difficulty concentrating, remembering, or making decisions? : no Will 6CIT or Mini Cog be Completed: yes What year is it?: 0 points What month is it?: 0 points Give patient an address phrase to remember (5 components): [address] [place]  About what time is it?: 0 points Count backwards from 20 to 1: 0 points Say the months of the year in reverse: 0 points Repeat the address phrase from earlier: 0 points 6 CIT Score: 0 points  Advance Directives (For Healthcare) Does Patient Have a Medical Advance Directive?: Yes Does patient want to make changes to medical advance directive?: No - Patient declined Type of Advance Directive: Healthcare Power of [place]; Living will Copy of Healthcare Power of Attorney in Chart?: No - copy requested Copy of Living Will in Chart?: No - copy requested  Reviewed/Updated  Reviewed/Updated: Reviewed All (Medical, Surgical, Family, Medications, Allergies, Care Teams, Patient Goals)    Allergies (verified) Methotrexate  and trimetrexate, Phenergan  [promethazine  hcl], Humira [adalimumab], Penicillins, and Remicade [infliximab]   Current Medications (verified) Outpatient Encounter Medications as of 09/01/2024  Medication Sig    acetaminophen  (TYLENOL ) 500 MG tablet Take 500 mg by  mouth every 6 (six) hours as needed for moderate pain.   albuterol  (PROVENTIL ) (2.5 MG/3ML) 0.083% nebulizer solution Take 3 mLs (2.5 mg total) by nebulization every 6 (six) hours as needed for wheezing or shortness of breath.   albuterol  (VENTOLIN  HFA) 108 (90 Base) MCG/ACT inhaler Inhale 2 puffs into the lungs every 6 (six) hours as needed for wheezing or shortness of breath.   calcium  carbonate (TUMS - DOSED IN MG ELEMENTAL CALCIUM ) 500 MG chewable tablet Chew 1 tablet by mouth 2 (two) times daily.   cholecalciferol  (VITAMIN D3) 25 MCG (1000 UNIT) tablet Take 5,000 Units by mouth daily.   COPPER  PO Take 4 mg by mouth daily.   cyanocobalamin  (VITAMIN B12) 1000 MCG/ML injection Inject 1 mL (1,000 mcg total) into the muscle every 30 (thirty) days.   dicyclomine  (BENTYL ) 10 MG capsule Take 1 capsule (10 mg total) by mouth every 8 (eight) hours as needed (abdominal cramping).   diphenoxylate -atropine  (LOMOTIL ) 2.5-0.025 MG tablet Take 1 tablet by mouth 4 (four) times daily as needed for diarrhea or loose stools.   ferrous sulfate  325 (65 FE) MG EC tablet Take 325 mg by mouth daily with breakfast.   magnesium  (MAGTAB) 84 MG ( ) TBCR SR tablet Take 84 mg by mouth daily.   Multiple Vitamin (MULTIVITAMIN WITH MINERALS) TABS tablet Take 1 tablet by mouth daily.   Na Sulfate-K Sulfate-Mg Sulfate concentrate (SUPREP) 17.5-3.13-1.6 GM/177ML SOLN Use as directed; may use generic; goodrx card if insurance will not cover generic   omeprazole  (PRILOSEC) 20 MG capsule TAKE ONE CAPSULE DAILY   ondansetron  (ZOFRAN ) 4 MG tablet TAKE 1 TABLET EVERY 8 HOURS AS NEEDED FOR NASUEA OR VOMITING   potassium chloride  (KLOR-CON ) 20 MEQ packet Take 20 mEq by mouth daily.   sertraline  (ZOLOFT ) 50 MG tablet Take 1 tablet (50 mg total) by mouth daily.   sildenafil  (VIAGRA ) 25 MG tablet Take 0.5 tablets (12.5 mg total) by mouth daily as needed for erectile dysfunction.    SKYRIZI  360 MG/2.4ML SOCT Inject 360 mg into the skin every 8 (eight) weeks. Start 4 weeks after the last Skyrizi  infusion.   zinc  gluconate 50 MG tablet Take 50 mg by mouth daily.   No facility-administered encounter medications on file as of 09/01/2024.    History: Past Medical History:  Diagnosis Date   Acute endocarditis    Blood transfusion without reported diagnosis    COVID 05/2021   Crohn's disease (HCC)    Depression    Eating disorder    GERD (gastroesophageal reflux disease)    Kidney stones    Osteoporosis    Short gut syndrome    Past Surgical History:  Procedure Laterality Date   COLONOSCOPY  06/11/2020   COLONOSCOPY  07/05/2018   COLONOSCOPY WITH PROPOFOL  N/A 09/17/2017   Procedure: COLONOSCOPY WITH PROPOFOL ;  Surgeon: Shila Gustav GAILS, MD;  Location: [facility];  Service: Endoscopy;  Laterality: N/A;  Through ileostomy.   CYSTOSCOPY WITH STENT PLACEMENT Left 05/20/2017   Procedure: CYSTOSCOPY WITH STENT PLACEMENT;  Surgeon: Cam Morene ORN, MD;  Location: [facility];  Service: Urology;  Laterality: Left;   ILEOSTOMY     IR NEPHROSTOMY EXCHANGE LEFT  04/12/2017   IR NEPHROSTOMY PLACEMENT LEFT  04/12/2017   IR NEPHROSTOMY PLACEMENT LEFT  04/29/2017   NEPHROLITHOTOMY Left 05/14/2017   Procedure: LEFT NEPHROLITHOTOMY PERCUTANEOUS WITH SURGEON ACCESS;  Surgeon: Cam Morene ORN, MD;  Location: [facility];  Service: Urology;  Laterality: Left;   SMALL INTESTINE SURGERY  SUBTOTAL COLECTOMY     Family History  Problem Relation Age of Onset   Heart disease Mother    Diabetes Father    Heart disease Father    Heart attack Father    Cancer Sister    Heart disease Sister    Melanoma Sister    Anemia Sister    Crohn's disease Brother    Inflammatory bowel disease Brother    Diabetes Paternal Grandmother    Colon cancer Neg Hx    Rectal cancer Neg Hx    Stomach cancer Neg Hx    Esophageal cancer Neg Hx    Social History   Occupational History   Occupation:  Retired   Occupation: On disability  Tobacco Use   Smoking status: Former    Current packs/day: 0.00    Average packs/day: 1 pack/day for 20.0 years (20.0 ttl pk-yrs)    Types: Cigarettes    Start date: 08/31/1986    Quit date: 08/31/2006    Years since quitting: 18.0   Smokeless tobacco: Never  Vaping Use   Vaping status: Never Used  Substance and Sexual Activity   Alcohol use: Yes    Comment: occ   Drug use: No   Sexual activity: Not Currently   Tobacco Counseling Counseling given: No  SDOH Screenings   Food Insecurity: Patient Declined (09/01/2024)  Housing: Low Risk (09/01/2024)  Transportation Needs: No Transportation Needs (09/01/2024)  Utilities: Not At Risk (09/01/2024)  Alcohol Screen: Low Risk (08/30/2023)  Depression (PHQ2-9): Low Risk (09/01/2024)  Recent Concern: Depression (PHQ2-9) - Medium Risk (07/17/2024)  Financial Resource Strain: Medium Risk (09/01/2024)  Physical Activity: Patient Declined (09/01/2024)  Social Connections: Unknown (09/01/2024)  Stress: No Stress Concern Present (09/01/2024)  Tobacco Use: Medium Risk (09/01/2024)  Health Literacy: Adequate Health Literacy (09/01/2024)   See flowsheets for full screening details  Depression Screen PHQ 2 & 9 Depression Scale- Over the past 2 weeks, how often have you been bothered by any of the following problems? Little interest or pleasure in doing things: 0 Feeling down, depressed, or hopeless (PHQ Adolescent also includes...irritable): 0 PHQ-2 Total Score: 0 Trouble falling or staying asleep, or sleeping too much: 0 Feeling tired or having little energy: 0 Poor appetite or overeating (PHQ Adolescent also includes...weight loss): 0 Feeling bad about yourself - or that you are a failure or have let yourself or your family down: 0 Trouble concentrating on things, such as reading the newspaper or watching television (PHQ Adolescent also includes...like school work): 0 Moving or speaking so slowly that other people could have  noticed. Or the opposite - being so fidgety or restless that you have been moving around a lot more than usual: 0 Thoughts that you would be better off dead, or of hurting yourself in some way: 0 PHQ-9 Total Score: 0 If you checked off any problems, how difficult have these problems made it for you to do your work, take care of things at home, or get along with other people?: Not difficult at all     Goals Addressed               This Visit's Progress     Increase physical activity (pt-stated)        Get more active.             Objective:    Today's Vitals   09/01/24 1124  Weight: 123 lb (55.8 kg)  Height: 5' 11 (1.803 m)   Body mass index is 17.16  kg/m.  Hearing/Vision screen Hearing Screening - Comments:: Denies hearing difficulties   Vision Screening - Comments:: Wears rx glasses - up to date with routine eye exams with  [facility] Immunizations and Health Maintenance Health Maintenance  Topic Date Due   Pneumococcal Vaccine: 50+ Years (3 of 3 - PCV20 or PCV21) 06/15/2025 (Originally 04/09/2024)   COVID-19 Vaccine (7 - Moderna risk 2025-26 season) 11/15/2024   Medicare Annual Wellness (AWV)  09/01/2025   Colonoscopy  04/03/2032   DTaP/Tdap/Td (2 - Td or Tdap) 06/02/2034   Influenza Vaccine  Completed   Hepatitis C Screening  Completed   HIV Screening  Completed   Zoster Vaccines- Shingrix  Completed   Hepatitis B Vaccines 19-59 Average Risk  Aged Out   HPV VACCINES  Aged Out   Meningococcal B Vaccine  Aged Out        Assessment/Plan:  This is a routine wellness examination for [place].  Patient Care Team: Mercer Clotilda SAUNDERS, MD as PCP - General (Family Medicine)  I have personally reviewed and noted the following in the patients chart:   Medical and social history Use of alcohol, tobacco or illicit drugs  Current medications and supplements including opioid prescriptions. Functional ability and status Nutritional status Physical  activity Advanced directives List of other physicians Hospitalizations, surgeries, and ER visits in previous 12 months Vitals Screenings to include cognitive, depression, and falls Referrals and appointments  No orders of the defined types were placed in this encounter.  In addition, I have reviewed and discussed with patient certain preventive protocols, quality metrics, and best practice recommendations. A written personalized care plan for preventive services as well as general preventive health recommendations were provided to patient.   Rojelio LELON Blush, LPN   04/06/7972   Return in 53 weeks (on 09/07/2025).  After Visit Summary: (MyChart) Due to this being a telephonic visit, the after visit summary with patients personalized plan was offered to patient via MyChart   Nurse Notes: No voiced or noted concerns at this time "

## 2024-09-01 NOTE — Patient Instructions (Addendum)
 Gregory [name],  Thank you for taking the time for your Medicare Wellness Visit. I appreciate your continued commitment to your health goals. Please review the care plan we discussed, and feel free to reach out if I can assist you further.  Please note that Annual Wellness Visits do not include a physical exam. Some assessments may be limited, especially if the visit was conducted virtually. If needed, we may recommend an in-person follow-up with your provider.  Ongoing Care Seeing your primary care provider every 3 to 6 months helps us  monitor your health and provide consistent, personalized care.   Referrals If a referral was made during today's visit and you haven't received any updates within two weeks, please contact the referred provider directly to check on the status.  Recommended Screenings:  Health Maintenance  Topic Date Due   Pneumococcal Vaccine for age over 62 (3 of 3 - PCV20 or PCV21) 06/15/2025*   COVID-19 Vaccine (7 - Moderna risk 2025-26 season) 11/15/2024   Medicare Annual Wellness Visit  09/01/2025   Colon Cancer Screening  04/03/2032   DTaP/Tdap/Td vaccine (2 - Td or Tdap) 06/02/2034   Flu Shot  Completed   Hepatitis C Screening  Completed   HIV Screening  Completed   Zoster (Shingles) Vaccine  Completed   Hepatitis B Vaccine  Aged Out   HPV Vaccine  Aged Out   Meningitis B Vaccine  Aged Out  *Topic was postponed. The date shown is not the original due date.   Opioid Pain Medicine Management Opioids are powerful medicines that are used to treat moderate to severe pain. When used for short periods of time, they can help you to: Sleep better. Do better in physical or occupational therapy. Feel better in the first few days after an injury. Recover from surgery. Opioids should be taken with the supervision of a trained health care provider. They should be taken for the shortest period of time possible. This is because opioids can be addictive, and the longer you take  opioids, the greater your risk of addiction. This addiction can also be called opioid use disorder. What are the risks? Using opioid pain medicines for longer than 3 days increases your risk of side effects. Side effects include: Constipation. Nausea and vomiting. Breathing difficulties (respiratory depression). Drowsiness. Confusion. Opioid use disorder. Itching. Taking opioid pain medicine for a long period of time can affect your ability to do daily tasks. It also puts you at risk for: Motor vehicle crashes. Depression. Suicide. Heart attack. Overdose, which can be life-threatening. What is a pain treatment plan? A pain treatment plan is an agreement between you and your health care provider. Pain is unique to each person, and treatments vary depending on your condition. To manage your pain, you and your health care provider need to work together. To help you do this: Discuss the goals of your treatment, including how much pain you might expect to have and how you will manage the pain. Review the risks and benefits of taking opioid medicines. Remember that a good treatment plan uses more than one approach and minimizes the chance of side effects. Be honest about the amount of medicines you take and about any drug or alcohol use. Get pain medicine prescriptions from only one health care provider. Pain can be managed with many types of alternative treatments. Ask your health care provider to refer you to one or more specialists who can help you manage pain through: Physical or occupational therapy. Counseling (cognitive behavioral therapy).  Good nutrition. Biofeedback. Massage. Meditation. Non-opioid medicine. Following a gentle exercise program. How to use opioid pain medicine Taking medicine Take your pain medicine exactly as told by your health care provider. Take it only when you need it. If your pain gets less severe, you may take less than your prescribed dose if your health  care provider approves. If you are not having pain, do nottake pain medicine unless your health care provider tells you to take it. If your pain is severe, do nottry to treat it yourself by taking more pills than instructed on your prescription. Contact your health care provider for help. Write down the times when you take your pain medicine. It is easy to become confused while on pain medicine. Writing the time can help you avoid overdose. Take other over-the-counter or prescription medicines only as told by your health care provider. Keeping yourself and others safe  While you are taking opioid pain medicine: Do not drive, use machinery, or power tools. Do not sign legal documents. Do not drink alcohol. Do not take sleeping pills. Do not supervise children by yourself. Do not do activities that require climbing or being in high places. Do not go to a lake, river, ocean, spa, or swimming pool. Do not share your pain medicine with anyone. Keep pain medicine in a locked cabinet or in a secure area where pets and children cannot reach it. Stopping your use of opioids If you have been taking opioid medicine for more than a few weeks, you may need to slowly decrease (taper) how much you take until you stop completely. Tapering your use of opioids can decrease your risk of symptoms of withdrawal, such as: Pain and cramping in the abdomen. Nausea. Sweating. Sleepiness. Restlessness. Uncontrollable shaking (tremors). Cravings for the medicine. Do not attempt to taper your use of opioids on your own. Talk with your health care provider about how to do this. Your health care provider may prescribe a step-down schedule based on how much medicine you are taking and how long you have been taking it. Getting rid of leftover pills Do not save any leftover pills. Get rid of leftover pills safely by: Taking the medicine to a prescription take-back program. This is usually offered by the county or law  enforcement. Bringing them to a pharmacy that has a drug disposal container. Flushing them down the toilet. Check the label or package insert of your medicine to see whether this is safe to do. Throwing them out in the trash. Check the label or package insert of your medicine to see whether this is safe to do. If it is safe to throw it out, remove the medicine from the original container, put it into a sealable bag or container, and mix it with used coffee grounds, food scraps, dirt, or cat litter before putting it in the trash. Follow these instructions at home: Activity Do exercises as told by your health care provider. Avoid activities that make your pain worse. Return to your normal activities as told by your health care provider. Ask your health care provider what activities are safe for you. General instructions You may need to take these actions to prevent or treat constipation: Drink enough fluid to keep your urine pale yellow. Take over-the-counter or prescription medicines. Eat foods that are high in fiber, such as beans, whole grains, and fresh fruits and vegetables. Limit foods that are high in fat and processed sugars, such as fried or sweet foods. Keep all follow-up visits. This  is important. Where to find support If you have been taking opioids for a long time, you may benefit from receiving support for quitting from a local support group or counselor. Ask your health care provider for a referral to these resources in your area. Where to find more information Centers for Disease Control and Prevention (CDC): footballexhibition.com.br [place] Food and Drug Administration (FDA): pumpkinsearch.com.ee Get help right away if: You may have taken too much of an opioid (overdosed). Common symptoms of an overdose: Your breathing is slower or more shallow than normal. You have a very slow heartbeat (pulse). You have slurred speech. You have nausea and vomiting. Your pupils become very small. You have other  potential symptoms: You are very confused. You faint or feel like you will faint. You have cold, clammy skin. You have blue lips or fingernails. You have thoughts of harming yourself or harming others. These symptoms may represent a serious problem that is an emergency. Do not wait to see if the symptoms will go away. Get medical help right away. Call your local emergency services (911 in the [place] Do not drive yourself to the hospital.  If you ever feel like you may hurt yourself or others, or have thoughts about taking your own life, get help right away. Go to your nearest emergency department or: Call your local emergency services (911 in the [place] Call the [facility] (702-368-6938 in the [place] Call a suicide crisis helpline, such as the National Suicide Prevention Lifeline at 803-076-1119 or 988 in the [place] This is open 24 hours a day in the [place] If youre a Veteran: Call 988 and press 1. This is open 24 hours a day. Text the [employer] at 914-595-8757. Summary Opioid medicines can help you manage moderate to severe pain for a short period of time. A pain treatment plan is an agreement between you and your health care provider. Discuss the goals of your treatment, including how much pain you might expect to have and how you will manage the pain. If you think that you or someone else may have taken too much of an opioid, get medical help right away. This information is not intended to replace advice given to you by your health care provider. Make sure you discuss any questions you have with your health care provider. Document Revised: 05/24/2023 Document Reviewed: 11/27/2020 Elsevier Patient Education  2024 Elsevier Inc.    09/01/2024   11:27 AM  Advanced Directives  Does Patient Have a Medical Advance Directive? Yes  Type of Estate Agent of [place];Living will  Does patient want to make changes to medical advance directive? No -  Patient declined  Copy of Healthcare Power of Attorney in Chart? No - copy requested    Vision: Annual vision screenings are recommended for early detection of glaucoma, cataracts, and diabetic retinopathy. These exams can also reveal signs of chronic conditions such as diabetes and high blood pressure.  Dental: Annual dental screenings help detect early signs of oral cancer, gum disease, and other conditions linked to overall health, including heart disease and diabetes.  Please see the attached documents for additional preventive care recommendations.

## 2024-09-06 ENCOUNTER — Encounter: Payer: Self-pay | Admitting: Family Medicine

## 2024-09-06 DIAGNOSIS — N529 Male erectile dysfunction, unspecified: Secondary | ICD-10-CM

## 2024-09-07 MED ORDER — SILDENAFIL CITRATE 25 MG PO TABS: 12.5000 mg | ORAL_TABLET | Freq: Every day | ORAL | 4 refills | Status: AC | PRN

## 2024-09-11 ENCOUNTER — Other Ambulatory Visit (HOSPITAL_COMMUNITY): Payer: Self-pay

## 2024-09-15 ENCOUNTER — Inpatient Hospital Stay: Attending: Hematology and Oncology

## 2024-09-15 ENCOUNTER — Encounter: Payer: Self-pay | Admitting: "Endocrinology

## 2024-09-15 DIAGNOSIS — K90829 Short bowel syndrome, unspecified: Secondary | ICD-10-CM | POA: Diagnosis not present

## 2024-09-15 DIAGNOSIS — D509 Iron deficiency anemia, unspecified: Secondary | ICD-10-CM | POA: Insufficient documentation

## 2024-09-15 DIAGNOSIS — Z79899 Other long term (current) drug therapy: Secondary | ICD-10-CM | POA: Insufficient documentation

## 2024-09-15 DIAGNOSIS — Z8679 Personal history of other diseases of the circulatory system: Secondary | ICD-10-CM | POA: Diagnosis not present

## 2024-09-15 DIAGNOSIS — K5090 Crohn's disease, unspecified, without complications: Secondary | ICD-10-CM | POA: Insufficient documentation

## 2024-09-15 DIAGNOSIS — N189 Chronic kidney disease, unspecified: Secondary | ICD-10-CM | POA: Diagnosis not present

## 2024-09-15 DIAGNOSIS — Z87442 Personal history of urinary calculi: Secondary | ICD-10-CM | POA: Diagnosis not present

## 2024-09-15 DIAGNOSIS — E610 Copper deficiency: Secondary | ICD-10-CM | POA: Diagnosis not present

## 2024-09-15 DIAGNOSIS — D539 Nutritional anemia, unspecified: Secondary | ICD-10-CM

## 2024-09-15 DIAGNOSIS — E559 Vitamin D deficiency, unspecified: Secondary | ICD-10-CM | POA: Diagnosis not present

## 2024-09-15 DIAGNOSIS — E538 Deficiency of other specified B group vitamins: Secondary | ICD-10-CM | POA: Insufficient documentation

## 2024-09-15 DIAGNOSIS — R634 Abnormal weight loss: Secondary | ICD-10-CM | POA: Insufficient documentation

## 2024-09-15 DIAGNOSIS — E60 Dietary zinc deficiency: Secondary | ICD-10-CM

## 2024-09-15 DIAGNOSIS — D638 Anemia in other chronic diseases classified elsewhere: Secondary | ICD-10-CM | POA: Diagnosis not present

## 2024-09-15 LAB — TSH: TSH: 4.31 u[IU]/mL (ref 0.350–4.500)

## 2024-09-15 LAB — CMP (CANCER CENTER ONLY)
ALT: 18 U/L (ref 0–44)
AST: 23 U/L (ref 15–41)
Albumin: 3.8 g/dL (ref 3.5–5.0)
Alkaline Phosphatase: 97 U/L (ref 38–126)
Anion gap: 13 (ref 5–15)
BUN: 10 mg/dL (ref 8–23)
CO2: 22 mmol/L (ref 22–32)
Calcium: 9 mg/dL (ref 8.9–10.3)
Chloride: 103 mmol/L (ref 98–111)
Creatinine: 1.33 mg/dL — ABNORMAL HIGH (ref 0.61–1.24)
GFR, Estimated: >60 mL/min
Glucose, Bld: 104 mg/dL — ABNORMAL HIGH (ref 70–99)
Potassium: 3.7 mmol/L (ref 3.5–5.1)
Sodium: 138 mmol/L (ref 135–145)
Total Bilirubin: 0.5 mg/dL (ref 0.0–1.2)
Total Protein: 8 g/dL (ref 6.5–8.1)

## 2024-09-15 LAB — CBC WITH DIFFERENTIAL (CANCER CENTER ONLY)
Abs Immature Granulocytes: 0.03 K/uL (ref 0.00–0.07)
Basophils Absolute: 0.1 K/uL (ref 0.0–0.1)
Basophils Relative: 1 %
Eosinophils Absolute: 0.2 K/uL (ref 0.0–0.5)
Eosinophils Relative: 2 %
HCT: 42.5 % (ref 39.0–52.0)
Hemoglobin: 13.4 g/dL (ref 13.0–17.0)
Immature Granulocytes: 0 %
Lymphocytes Relative: 13 %
Lymphs Abs: 1.2 K/uL (ref 0.7–4.0)
MCH: 26.5 pg (ref 26.0–34.0)
MCHC: 31.5 g/dL (ref 30.0–36.0)
MCV: 84 fL (ref 80.0–100.0)
Monocytes Absolute: 0.6 K/uL (ref 0.1–1.0)
Monocytes Relative: 7 %
Neutro Abs: 7.5 K/uL (ref 1.7–7.7)
Neutrophils Relative %: 77 %
Platelet Count: 387 K/uL (ref 150–400)
RBC: 5.06 MIL/uL (ref 4.22–5.81)
RDW: 17.4 % — ABNORMAL HIGH (ref 11.5–15.5)
WBC Count: 9.6 K/uL (ref 4.0–10.5)
nRBC: 0 % (ref 0.0–0.2)

## 2024-09-15 LAB — FERRITIN: Ferritin: 67 ng/mL (ref 24–336)

## 2024-09-15 LAB — IRON AND IRON BINDING CAPACITY (CC-WL,HP ONLY)
Iron: 26 ug/dL — ABNORMAL LOW (ref 45–182)
Saturation Ratios: 7 % — ABNORMAL LOW (ref 17.9–39.5)
TIBC: 350 ug/dL (ref 250–450)
UIBC: 324 ug/dL

## 2024-09-15 LAB — VITAMIN D 25 HYDROXY (VIT D DEFICIENCY, FRACTURES): Vit D, 25-Hydroxy: 26 ng/mL — ABNORMAL LOW (ref 30–100)

## 2024-09-15 LAB — VITAMIN B12: Vitamin B-12: 476 pg/mL (ref 180–914)

## 2024-09-18 LAB — ZINC: Zinc: 44 ug/dL (ref 44–115)

## 2024-09-20 ENCOUNTER — Other Ambulatory Visit: Payer: Self-pay

## 2024-09-20 ENCOUNTER — Other Ambulatory Visit: Payer: Self-pay | Admitting: Pharmacy Technician

## 2024-09-20 NOTE — Progress Notes (Signed)
 Specialty Pharmacy Refill Coordination Note  Gregory D Oakland Jr. is a 64 y.o. male contacted today regarding refills of specialty medication(s) Risankizumab -rzaa (Skyrizi )   Patient requested Delivery   Delivery date: 09/27/24   Verified address: [address]   [place], [place] [address]   Medication will be filled on: 09/26/24

## 2024-09-22 ENCOUNTER — Encounter: Payer: Self-pay | Admitting: Hematology and Oncology

## 2024-09-22 ENCOUNTER — Inpatient Hospital Stay: Admitting: Hematology and Oncology

## 2024-09-22 VITALS — BP 109/76 | HR 83 | Temp 98.6°F | Resp 18 | Ht 71.0 in | Wt 123.8 lb

## 2024-09-22 DIAGNOSIS — D509 Iron deficiency anemia, unspecified: Secondary | ICD-10-CM | POA: Diagnosis not present

## 2024-09-22 DIAGNOSIS — D539 Nutritional anemia, unspecified: Secondary | ICD-10-CM

## 2024-09-22 DIAGNOSIS — E559 Vitamin D deficiency, unspecified: Secondary | ICD-10-CM | POA: Diagnosis not present

## 2024-09-22 MED ORDER — ERGOCALCIFEROL 1.25 MG (50000 UT) PO CAPS: 50000.0000 [IU] | ORAL_CAPSULE | ORAL | 1 refills | Status: AC

## 2024-09-22 MED ORDER — CYANOCOBALAMIN 1000 MCG/ML IJ SOLN: 1000.0000 ug | INTRAMUSCULAR | 1 refills | Status: AC

## 2024-09-22 NOTE — Progress Notes (Signed)
 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 He had history of vitamin B12 deficiency, borderline iron deficiency and copper  deficiencies He also noted to have borderline chronic kidney disease that could be contributing to anemia chronic illness With aggressive dietary modification and additional mineral supplementation, anemia is corrected I plan to recommend continue nutritional supplementation with oral supplements and I plan to see him again in 6 months for further follow-up Vitamin D  deficiency His vitamin D  level is borderline low I recommend resumption of high-dose vitamin D  supplement and I sent prescription    Orders Placed This Encounter  Procedures   CBC with Differential (Cancer Center Only)    Standing Status:   Future    Expiration Date:   09/22/2025   CMP (Cancer Center only)    Standing Status:   Future    Expiration Date:   09/22/2025   Iron and Iron Binding Capacity ([facility] only)    Standing Status:   Future    Expiration Date:   09/22/2025   Vitamin B12    Standing Status:   Future    Expiration Date:   09/22/2025   Ferritin    Standing Status:   Future    Expiration Date:   09/22/2025   Copper , serum    Standing Status:   Future    Expiration Date:   09/22/2025   Vitamin B1    Standing Status:   Future    Expiration Date:   09/22/2025   Zinc     Standing Status:   Future    Expiration Date:   09/22/2025   VITAMIN D  25 Hydroxy (Vit-D Deficiency, Fractures)    Standing Status:   Future    Expiration Date:   09/22/2025    INTERVAL HISTORY: Patient returns for recurrent anemia Symptoms of anemia includes none We reviewed multiple test results  SUMMARY OF HEMATOLOGIC HISTORY:  He was found to have abnormal CBC from recent blood draw The patient have extensive surgeries in the past secondary to Crohn's disease.  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  He also have history of endocarditis and kidney stones He was found to have microcytic anemia from blood draw He has frequent blood draw due to significant trace mineral deficiencies with confirm vitamin D  deficiency and vitamin B12 deficiencies  He denies recent chest pain on exertion, shortness of breath on minimal exertion, pre-syncopal episodes, or palpitations. He had not noticed any recent bleeding such as epistaxis, hematuria or hematochezia The patient denies over the counter NSAID ingestion. He is not on antiplatelets agents. His last colonoscopy on August 2023 showed diffuse ileitis and inflammation He had no prior history or diagnosis of cancer. His age appropriate screening programs are up-to-date. He denies any pica and eats a variety of diet. He never donated blood  The patient was prescribed oral iron supplements and he takes one daily. He has lost some weight from peak weight of around 142 pounds but he is gaining some weight back Blood work from 2024 revealed multiple mineral deficiencies including copper  deficiency.  The patient started to take copper  supplement and increase oral intake  Lab Results  Component Value Date   VITAMINB12 476 09/15/2024   FERRITIN 67 09/15/2024   HGB 13.4 09/15/2024   RBC 5.06 09/15/2024   Vitals:   09/22/24 1431  BP: 109/76  Pulse: 83  Resp: 18  Temp: 98.6 F (37 C)  SpO2: 98%   "

## 2024-09-22 NOTE — Assessment & Plan Note (Addendum)
 Cause of his anemia is multifactorial He had history of vitamin B12 deficiency, borderline iron deficiency and copper  deficiencies He also noted to have borderline chronic kidney disease that could be contributing to anemia chronic illness With aggressive dietary modification and additional mineral supplementation, anemia is corrected I plan to recommend continue nutritional supplementation with oral supplements and I plan to see him again in 6 months for further follow-up

## 2024-09-22 NOTE — Assessment & Plan Note (Addendum)
 His vitamin D  level is borderline low I recommend resumption of high-dose vitamin D  supplement and I sent prescription

## 2024-09-26 ENCOUNTER — Other Ambulatory Visit: Payer: Self-pay

## 2024-09-26 ENCOUNTER — Other Ambulatory Visit (HOSPITAL_COMMUNITY): Payer: Self-pay

## 2024-10-05 ENCOUNTER — Other Ambulatory Visit (HOSPITAL_BASED_OUTPATIENT_CLINIC_OR_DEPARTMENT_OTHER): Payer: Self-pay

## 2024-11-15 ENCOUNTER — Ambulatory Visit: Admitting: "Endocrinology

## 2025-03-16 ENCOUNTER — Inpatient Hospital Stay

## 2025-03-23 ENCOUNTER — Inpatient Hospital Stay: Admitting: Hematology and Oncology

## 2025-09-07 ENCOUNTER — Ambulatory Visit
